# Patient Record
Sex: Male | Born: 1950
Health system: Southern US, Community
[De-identification: ages and names within clinical notes are randomized; demographics above are authoritative.]

## PROBLEM LIST (undated history)

## (undated) ENCOUNTER — Emergency Department (HOSPITAL_COMMUNITY): Discharge status: Against Medical Advice | Payer: Self-pay

## (undated) DIAGNOSIS — I639 Cerebral infarction, unspecified: Secondary | ICD-10-CM

## (undated) DIAGNOSIS — M1990 Unspecified osteoarthritis, unspecified site: Secondary | ICD-10-CM

## (undated) DIAGNOSIS — K6389 Other specified diseases of intestine: Secondary | ICD-10-CM

## (undated) DIAGNOSIS — Z87442 Personal history of urinary calculi: Secondary | ICD-10-CM

## (undated) DIAGNOSIS — I7121 Aneurysm of the ascending aorta, without rupture: Secondary | ICD-10-CM

## (undated) DIAGNOSIS — E039 Hypothyroidism, unspecified: Secondary | ICD-10-CM

## (undated) DIAGNOSIS — I10 Essential (primary) hypertension: Secondary | ICD-10-CM

## (undated) DIAGNOSIS — I8290 Acute embolism and thrombosis of unspecified vein: Secondary | ICD-10-CM

## (undated) DIAGNOSIS — I712 Thoracic aortic aneurysm, without rupture: Secondary | ICD-10-CM

## (undated) DIAGNOSIS — C189 Malignant neoplasm of colon, unspecified: Secondary | ICD-10-CM

## (undated) DIAGNOSIS — K635 Polyp of colon: Secondary | ICD-10-CM

## (undated) DIAGNOSIS — Z1379 Encounter for other screening for genetic and chromosomal anomalies: Secondary | ICD-10-CM

## (undated) DIAGNOSIS — Z972 Presence of dental prosthetic device (complete) (partial): Secondary | ICD-10-CM

## (undated) DIAGNOSIS — Z973 Presence of spectacles and contact lenses: Secondary | ICD-10-CM

## (undated) HISTORY — DX: Polyp of colon: K63.5

## (undated) HISTORY — DX: Malignant neoplasm of colon, unspecified: C18.9

## (undated) HISTORY — PX: MULTIPLE TOOTH EXTRACTIONS: SHX2053

## (undated) HISTORY — DX: Cerebral infarction, unspecified: I63.9

## (undated) HISTORY — DX: Encounter for other screening for genetic and chromosomal anomalies: Z13.79

---

## 1998-06-23 ENCOUNTER — Other Ambulatory Visit: Admission: RE | Admit: 1998-06-23 | Discharge: 1998-06-23 | Payer: Self-pay | Admitting: Internal Medicine

## 1998-07-22 ENCOUNTER — Other Ambulatory Visit: Admission: RE | Admit: 1998-07-22 | Discharge: 1998-07-22 | Payer: Self-pay | Admitting: Internal Medicine

## 2015-12-16 ENCOUNTER — Other Ambulatory Visit (HOSPITAL_COMMUNITY): Payer: Self-pay | Admitting: Orthopaedic Surgery

## 2016-01-05 NOTE — Pre-Procedure Instructions (Signed)
    Christian Weeks  01/05/2016      [facility] - [place], [place] - [address] [place] [address] Phone: [phone] Fax: [phone]    Your procedure is scheduled on 01/15/16.  Report to [facility] Admitting at 530 A.M.  Call this number if you have problems the morning of surgery:  [phone]   Remember:  Do not eat food or drink liquids after midnight.  Take these medicines the morning of surgery with A SIP OF WATER --synthroid   Do not wear jewelry, make-up or nail polish.  Do not wear lotions, powders, or perfumes.  You may wear deodorant.  Do not shave 48 hours prior to surgery.  Men may shave face and neck.  Do not bring valuables to the hospital.  [facility] is not responsible for any belongings or valuables.  Contacts, dentures or bridgework may not be worn into surgery.  Leave your suitcase in the car.  After surgery it may be brought to your room.  For patients admitted to the hospital, discharge time will be determined by your treatment team.  Patients discharged the day of surgery will not be allowed to drive home.   ase read over the following fact sheets that you were given. Pain Booklet, Coughing and Deep Breathing, Blood Transfusion Information, MRSA Information and Surgical Site Infection Prevention

## 2016-01-06 ENCOUNTER — Encounter (HOSPITAL_COMMUNITY)
Admission: RE | Admit: 2016-01-06 | Discharge: 2016-01-06 | Disposition: A | Discharge status: Home / Self Care | Payer: PPO | Source: Ambulatory Visit | Attending: Orthopaedic Surgery | Admitting: Orthopaedic Surgery

## 2016-01-06 ENCOUNTER — Encounter (HOSPITAL_COMMUNITY): Payer: Self-pay

## 2016-01-06 DIAGNOSIS — I517 Cardiomegaly: Secondary | ICD-10-CM | POA: Insufficient documentation

## 2016-01-06 DIAGNOSIS — M1712 Unilateral primary osteoarthritis, left knee: Secondary | ICD-10-CM | POA: Diagnosis not present

## 2016-01-06 DIAGNOSIS — Z01818 Encounter for other preprocedural examination: Secondary | ICD-10-CM | POA: Insufficient documentation

## 2016-01-06 DIAGNOSIS — Z01812 Encounter for preprocedural laboratory examination: Secondary | ICD-10-CM | POA: Diagnosis not present

## 2016-01-06 HISTORY — DX: Essential (primary) hypertension: I10

## 2016-01-06 HISTORY — DX: Hypothyroidism, unspecified: E03.9

## 2016-01-06 HISTORY — DX: Unspecified osteoarthritis, unspecified site: M19.90

## 2016-01-06 LAB — URINALYSIS, ROUTINE W REFLEX MICROSCOPIC
BILIRUBIN URINE: NEGATIVE
GLUCOSE, UA: NEGATIVE mg/dL
Hgb urine dipstick: NEGATIVE
KETONES UR: NEGATIVE mg/dL
Leukocytes, UA: NEGATIVE
NITRITE: NEGATIVE
PH: 5 (ref 5.0–8.0)
Protein, ur: NEGATIVE mg/dL
SPECIFIC GRAVITY, URINE: 1.021 (ref 1.005–1.030)

## 2016-01-06 LAB — CBC
HEMATOCRIT: 48.9 % (ref 39.0–52.0)
HEMOGLOBIN: 16.8 g/dL (ref 13.0–17.0)
MCH: 30.7 pg (ref 26.0–34.0)
MCHC: 34.4 g/dL (ref 30.0–36.0)
MCV: 89.4 fL (ref 78.0–100.0)
Platelets: 210 10*3/uL (ref 150–400)
RBC: 5.47 MIL/uL (ref 4.22–5.81)
RDW: 13.1 % (ref 11.5–15.5)
WBC: 8.7 10*3/uL (ref 4.0–10.5)

## 2016-01-06 LAB — COMPREHENSIVE METABOLIC PANEL
ALK PHOS: 80 U/L (ref 38–126)
ALT: 23 U/L (ref 17–63)
ANION GAP: 7 (ref 5–15)
AST: 24 U/L (ref 15–41)
Albumin: 4 g/dL (ref 3.5–5.0)
BILIRUBIN TOTAL: 1 mg/dL (ref 0.3–1.2)
BUN: 9 mg/dL (ref 6–20)
CALCIUM: 9.2 mg/dL (ref 8.9–10.3)
CO2: 22 mmol/L (ref 22–32)
CREATININE: 1.08 mg/dL (ref 0.61–1.24)
Chloride: 109 mmol/L (ref 101–111)
GFR calc non Af Amer: >60 mL/min (ref >60)
GLUCOSE: 107 mg/dL — AB (ref 65–99)
Potassium: 4.5 mmol/L (ref 3.5–5.1)
Sodium: 138 mmol/L (ref 135–145)
TOTAL PROTEIN: 7.6 g/dL (ref 6.5–8.1)

## 2016-01-06 LAB — PROTIME-INR
INR: 1.14 (ref 0.00–1.49)
PROTHROMBIN TIME: 14.8 s (ref 11.6–15.2)

## 2016-01-06 LAB — SURGICAL PCR SCREEN
MRSA, PCR: NEGATIVE
Staphylococcus aureus: NEGATIVE

## 2016-01-11 NOTE — H&P (Signed)
TOTAL KNEE ADMISSION H&P  Patient is being admitted for left total knee arthroplasty.  Subjective:  Chief Complaint:left knee pain.  HPI: Christian Weeks, 65 y.o. male, has a history of pain and functional disability in the left knee due to arthritis and has failed non-surgical conservative treatments for greater than 12 weeks to includeNSAID's and/or analgesics, corticosteriod injections, supervised PT with diminished ADL's post treatment, use of assistive devices, weight reduction as appropriate and activity modification.  Onset of symptoms was gradual, starting >10 years ago with gradually worsening course since that time..  Patient currently rates pain in the left knee(s) at 10 out of 10 with activity. Patient has night pain, worsening of pain with activity and weight bearing, pain that interferes with activities of daily living, pain with passive range of motion, crepitus and joint swelling.  Patient has evidence of subchondral cysts, subchondral sclerosis, periarticular osteophytes and joint space narrowing by imaging studies. . There is no active infection.  There are no active problems to display for this patient.  Past Medical History  Diagnosis Date  . Hypertension   . Hypothyroidism   . Arthritis     No past surgical history on file.  No prescriptions prior to admission   Not on File  Social History  Substance Use Topics  . Smoking status: Never Smoker   . Smokeless tobacco: Never Used  . Alcohol Use: No    No family history on file.   Review of Systems  Constitutional: Negative.   HENT: Negative.   Eyes: Negative.   Respiratory: Negative.   Cardiovascular: Negative.   Gastrointestinal: Negative.   Musculoskeletal: Positive for joint pain.  Skin: Negative.   Neurological: Negative.   Psychiatric/Behavioral: Negative.     Objective:  Physical Exam  Constitutional: He is oriented to person, place, and time. No distress.  HENT:  Head: Atraumatic.  Eyes: EOM are  normal.  Neck: Normal range of motion.  Cardiovascular: Normal rate.   Respiratory: No respiratory distress.  GI: He exhibits no distension.  Musculoskeletal: He exhibits tenderness.  Neurological: He is alert and oriented to person, place, and time.  Skin: Skin is warm and dry.  Psychiatric: He has a normal mood and affect.    Vital signs in last 24 hours:    Labs:   There is no height or weight on file to calculate BMI.   Imaging Review Plain radiographs demonstrate moderate degenerative joint disease of the left knee(s). The overall alignment ismild varus. The bone quality appears to be good for age and reported activity level.  Assessment/Plan:  End stage arthritis, left knee   The patient history, physical examination, clinical judgment of the provider and imaging studies are consistent with end stage degenerative joint disease of the left knee(s) and total knee arthroplasty is deemed medically necessary. The treatment options including medical management, injection therapy arthroscopy and arthroplasty were discussed at length. The risks and benefits of total knee arthroplasty were presented and reviewed. The risks due to aseptic loosening, infection, stiffness, patella tracking problems, thromboembolic complications and other imponderables were discussed. The patient acknowledged the explanation, agreed to proceed with the plan and consent was signed. Patient is being admitted for inpatient treatment for surgery, pain control, PT, OT, prophylactic antibiotics, VTE prophylaxis, progressive ambulation and ADL's and discharge planning. The patient is planning to be discharged home with home health services

## 2016-01-14 MED ORDER — CEFAZOLIN SODIUM-DEXTROSE 2-3 GM-% IV SOLR
2.0000 g | INTRAVENOUS | Status: AC
  Administered 2016-01-15: 2 g via INTRAVENOUS
  Filled 2016-01-14: qty 50

## 2016-01-14 MED ORDER — CHLORHEXIDINE GLUCONATE 4 % EX LIQD: 60.0000 mL | Freq: Once | CUTANEOUS | Status: DC

## 2016-01-15 ENCOUNTER — Encounter (HOSPITAL_COMMUNITY)
Admission: RE | Disposition: A | Discharge status: Home Health | Payer: Self-pay | Source: Ambulatory Visit | Attending: Orthopaedic Surgery

## 2016-01-15 ENCOUNTER — Encounter (HOSPITAL_COMMUNITY): Payer: Self-pay | Admitting: Anesthesiology

## 2016-01-15 ENCOUNTER — Inpatient Hospital Stay (HOSPITAL_COMMUNITY)
Admission: RE | Admit: 2016-01-15 | Discharge: 2016-01-17 | DRG: 470 | Disposition: A | Discharge status: Home Health | Payer: PPO | Source: Ambulatory Visit | Attending: Orthopaedic Surgery | Admitting: Orthopaedic Surgery

## 2016-01-15 ENCOUNTER — Inpatient Hospital Stay (HOSPITAL_COMMUNITY): Payer: PPO | Admitting: Vascular Surgery

## 2016-01-15 ENCOUNTER — Inpatient Hospital Stay (HOSPITAL_COMMUNITY): Payer: PPO | Admitting: Anesthesiology

## 2016-01-15 DIAGNOSIS — E039 Hypothyroidism, unspecified: Secondary | ICD-10-CM | POA: Diagnosis not present

## 2016-01-15 DIAGNOSIS — M94262 Chondromalacia, left knee: Secondary | ICD-10-CM | POA: Diagnosis not present

## 2016-01-15 DIAGNOSIS — I10 Essential (primary) hypertension: Secondary | ICD-10-CM | POA: Diagnosis present

## 2016-01-15 DIAGNOSIS — Z96651 Presence of right artificial knee joint: Secondary | ICD-10-CM

## 2016-01-15 DIAGNOSIS — M25562 Pain in left knee: Secondary | ICD-10-CM | POA: Diagnosis present

## 2016-01-15 DIAGNOSIS — Z96652 Presence of left artificial knee joint: Secondary | ICD-10-CM

## 2016-01-15 DIAGNOSIS — M1712 Unilateral primary osteoarthritis, left knee: Principal | ICD-10-CM | POA: Diagnosis present

## 2016-01-15 HISTORY — PX: KNEE ARTHROPLASTY: SHX992

## 2016-01-15 SURGERY — ARTHROPLASTY, KNEE, TOTAL, USING IMAGELESS COMPUTER-ASSISTED NAVIGATION
Anesthesia: General | Laterality: Left

## 2016-01-15 MED ORDER — DEXTROSE 5 % IV SOLN
500.0000 mg | Freq: Four times a day (QID) | INTRAVENOUS | Status: DC | PRN
  Filled 2016-01-15: qty 5

## 2016-01-15 MED ORDER — LACTATED RINGERS IV SOLN
INTRAVENOUS | Status: DC | PRN
  Administered 2016-01-15 (×2): via INTRAVENOUS

## 2016-01-15 MED ORDER — ONDANSETRON HCL 4 MG/2ML IJ SOLN
INTRAMUSCULAR | Status: DC | PRN
  Administered 2016-01-15: 4 mg via INTRAVENOUS

## 2016-01-15 MED ORDER — GLYCOPYRROLATE 0.2 MG/ML IJ SOLN
INTRAMUSCULAR | Status: AC
  Filled 2016-01-15: qty 1

## 2016-01-15 MED ORDER — GLYCOPYRROLATE 0.2 MG/ML IJ SOLN
INTRAMUSCULAR | Status: DC | PRN
  Administered 2016-01-15: 0.2 mg via INTRAVENOUS

## 2016-01-15 MED ORDER — EPHEDRINE SULFATE 50 MG/ML IJ SOLN
INTRAMUSCULAR | Status: AC
  Filled 2016-01-15: qty 1

## 2016-01-15 MED ORDER — METOCLOPRAMIDE HCL 5 MG PO TABS: 5.0000 mg | ORAL_TABLET | Freq: Three times a day (TID) | ORAL | Status: DC | PRN

## 2016-01-15 MED ORDER — SODIUM CHLORIDE 0.9 % IJ SOLN
INTRAMUSCULAR | Status: AC
  Filled 2016-01-15: qty 10

## 2016-01-15 MED ORDER — ONDANSETRON HCL 4 MG/2ML IJ SOLN: 4.0000 mg | Freq: Once | INTRAMUSCULAR | Status: DC | PRN

## 2016-01-15 MED ORDER — PROPOFOL 10 MG/ML IV BOLUS
INTRAVENOUS | Status: AC
  Filled 2016-01-15: qty 20

## 2016-01-15 MED ORDER — MENTHOL 3 MG MT LOZG: 1.0000 | LOZENGE | OROMUCOSAL | Status: DC | PRN

## 2016-01-15 MED ORDER — ARTIFICIAL TEARS OP OINT
TOPICAL_OINTMENT | OPHTHALMIC | Status: AC
  Filled 2016-01-15: qty 3.5

## 2016-01-15 MED ORDER — POLYETHYLENE GLYCOL 3350 17 G PO PACK: 17.0000 g | PACK | Freq: Every day | ORAL | Status: DC | PRN

## 2016-01-15 MED ORDER — PHENYLEPHRINE HCL 10 MG/ML IJ SOLN
INTRAMUSCULAR | Status: DC | PRN
  Administered 2016-01-15: 40 ug via INTRAVENOUS

## 2016-01-15 MED ORDER — EPHEDRINE SULFATE 50 MG/ML IJ SOLN
INTRAMUSCULAR | Status: DC | PRN
  Administered 2016-01-15 (×2): 5 mg via INTRAVENOUS
  Administered 2016-01-15: 10 mg via INTRAVENOUS
  Administered 2016-01-15: 5 mg via INTRAVENOUS

## 2016-01-15 MED ORDER — SODIUM CHLORIDE 0.9 % IR SOLN
Status: DC | PRN
  Administered 2016-01-15: 1000 mL
  Administered 2016-01-15: 3000 mL

## 2016-01-15 MED ORDER — LIDOCAINE HCL (CARDIAC) 20 MG/ML IV SOLN
INTRAVENOUS | Status: DC | PRN
  Administered 2016-01-15 (×2): 50 mg via INTRAVENOUS

## 2016-01-15 MED ORDER — HYDROMORPHONE HCL 1 MG/ML IJ SOLN
INTRAMUSCULAR | Status: AC
  Administered 2016-01-15: 0.5 mg via INTRAVENOUS
  Filled 2016-01-15: qty 1

## 2016-01-15 MED ORDER — FENTANYL CITRATE (PF) 100 MCG/2ML IJ SOLN
INTRAMUSCULAR | Status: DC | PRN
  Administered 2016-01-15 (×5): 50 ug via INTRAVENOUS

## 2016-01-15 MED ORDER — ASPIRIN EC 325 MG PO TBEC
325.0000 mg | DELAYED_RELEASE_TABLET | Freq: Every day | ORAL | Status: DC
  Administered 2016-01-16 – 2016-01-17 (×2): 325 mg via ORAL
  Filled 2016-01-15 (×3): qty 1

## 2016-01-15 MED ORDER — METHOCARBAMOL 500 MG PO TABS
500.0000 mg | ORAL_TABLET | Freq: Four times a day (QID) | ORAL | Status: DC | PRN
  Administered 2016-01-16: 500 mg via ORAL
  Filled 2016-01-15 (×2): qty 1

## 2016-01-15 MED ORDER — LIDOCAINE HCL (CARDIAC) 20 MG/ML IV SOLN
INTRAVENOUS | Status: AC
  Filled 2016-01-15: qty 10

## 2016-01-15 MED ORDER — METOCLOPRAMIDE HCL 5 MG/ML IJ SOLN: 5.0000 mg | Freq: Three times a day (TID) | INTRAMUSCULAR | Status: DC | PRN

## 2016-01-15 MED ORDER — PROPOFOL 500 MG/50ML IV EMUL
INTRAVENOUS | Status: DC | PRN
  Administered 2016-01-15: 100 ug/kg/min via INTRAVENOUS

## 2016-01-15 MED ORDER — LIDOCAINE HCL (CARDIAC) 20 MG/ML IV SOLN
INTRAVENOUS | Status: AC
  Filled 2016-01-15: qty 5

## 2016-01-15 MED ORDER — POTASSIUM CHLORIDE IN NACL 20-0.45 MEQ/L-% IV SOLN
INTRAVENOUS | Status: DC
  Administered 2016-01-15: 17:00:00 via INTRAVENOUS
  Filled 2016-01-15 (×5): qty 1000

## 2016-01-15 MED ORDER — LOSARTAN POTASSIUM 50 MG PO TABS
50.0000 mg | ORAL_TABLET | Freq: Every day | ORAL | Status: DC
  Administered 2016-01-16 – 2016-01-17 (×2): 50 mg via ORAL
  Filled 2016-01-15 (×3): qty 1

## 2016-01-15 MED ORDER — CEFAZOLIN SODIUM 1-5 GM-% IV SOLN
1.0000 g | Freq: Three times a day (TID) | INTRAVENOUS | Status: AC
  Administered 2016-01-15 (×2): 1 g via INTRAVENOUS
  Filled 2016-01-15 (×2): qty 50

## 2016-01-15 MED ORDER — HYDROMORPHONE HCL 1 MG/ML IJ SOLN: 1.0000 mg | INTRAMUSCULAR | Status: DC | PRN

## 2016-01-15 MED ORDER — DOCUSATE SODIUM 100 MG PO CAPS
100.0000 mg | ORAL_CAPSULE | Freq: Two times a day (BID) | ORAL | Status: DC
  Administered 2016-01-15 – 2016-01-17 (×4): 100 mg via ORAL
  Filled 2016-01-15 (×5): qty 1

## 2016-01-15 MED ORDER — ONDANSETRON HCL 4 MG/2ML IJ SOLN: 4.0000 mg | Freq: Four times a day (QID) | INTRAMUSCULAR | Status: DC | PRN

## 2016-01-15 MED ORDER — LEVOTHYROXINE SODIUM 25 MCG PO TABS
137.0000 ug | ORAL_TABLET | Freq: Every day | ORAL | Status: DC
  Administered 2016-01-16 – 2016-01-17 (×2): 137 ug via ORAL
  Filled 2016-01-15 (×2): qty 1

## 2016-01-15 MED ORDER — PHENOL 1.4 % MT LIQD
1.0000 | OROMUCOSAL | Status: DC | PRN
Start: 2016-01-15 — End: 2016-01-17

## 2016-01-15 MED ORDER — HYDROMORPHONE HCL 1 MG/ML IJ SOLN
0.5000 mg | INTRAMUSCULAR | Status: DC | PRN
  Administered 2016-01-15 (×3): 0.5 mg via INTRAVENOUS

## 2016-01-15 MED ORDER — OXYCODONE HCL 5 MG PO TABS
ORAL_TABLET | ORAL | Status: AC
  Filled 2016-01-15: qty 2

## 2016-01-15 MED ORDER — PHENYLEPHRINE 40 MCG/ML (10ML) SYRINGE FOR IV PUSH (FOR BLOOD PRESSURE SUPPORT)
PREFILLED_SYRINGE | INTRAVENOUS | Status: AC
  Filled 2016-01-15: qty 10

## 2016-01-15 MED ORDER — ACETAMINOPHEN 650 MG RE SUPP: 650.0000 mg | Freq: Four times a day (QID) | RECTAL | Status: DC | PRN

## 2016-01-15 MED ORDER — ACETAMINOPHEN 325 MG PO TABS: 650.0000 mg | ORAL_TABLET | Freq: Four times a day (QID) | ORAL | Status: DC | PRN

## 2016-01-15 MED ORDER — OXYCODONE HCL 5 MG PO TABS
5.0000 mg | ORAL_TABLET | ORAL | Status: DC | PRN
  Administered 2016-01-15 – 2016-01-16 (×4): 10 mg via ORAL
  Filled 2016-01-15 (×3): qty 2

## 2016-01-15 MED ORDER — ROCURONIUM BROMIDE 50 MG/5ML IV SOLN
INTRAVENOUS | Status: AC
  Filled 2016-01-15: qty 1

## 2016-01-15 MED ORDER — FENTANYL CITRATE (PF) 250 MCG/5ML IJ SOLN
INTRAMUSCULAR | Status: AC
  Filled 2016-01-15: qty 10

## 2016-01-15 MED ORDER — ONDANSETRON HCL 4 MG PO TABS: 4.0000 mg | ORAL_TABLET | Freq: Four times a day (QID) | ORAL | Status: DC | PRN

## 2016-01-15 SURGICAL SUPPLY — 74 items
APL SKNCLS STERI-STRIP NONHPOA (GAUZE/BANDAGES/DRESSINGS) ×1
BANDAGE ELASTIC 4 VELCRO ST LF (GAUZE/BANDAGES/DRESSINGS) ×2 IMPLANT
BANDAGE ELASTIC 6 VELCRO ST LF (GAUZE/BANDAGES/DRESSINGS) ×2 IMPLANT
BANDAGE ESMARK 6X9 LF (GAUZE/BANDAGES/DRESSINGS) ×1 IMPLANT
BENZOIN TINCTURE PRP APPL 2/3 (GAUZE/BANDAGES/DRESSINGS) ×2 IMPLANT
BLADE SAGITTAL 25.0X1.19X90 (BLADE) ×2 IMPLANT
BLADE SAW SGTL 13X75X1.27 (BLADE) ×2 IMPLANT
BLADE SURG 10 STRL SS (BLADE) ×4 IMPLANT
BNDG CMPR MED 10X6 ELC LF (GAUZE/BANDAGES/DRESSINGS) ×1
BNDG ELASTIC 6X10 VLCR STRL LF (GAUZE/BANDAGES/DRESSINGS) ×2 IMPLANT
BNDG ESMARK 6X9 LF (GAUZE/BANDAGES/DRESSINGS) ×2
BOWL SMART MIX CTS (DISPOSABLE) ×2 IMPLANT
CAP KNEE TOTAL 3 SIGMA ×2 IMPLANT
CEMENT HV SMART SET (Cement) ×4 IMPLANT
CLSR STERI-STRIP ANTIMIC 1/2X4 (GAUZE/BANDAGES/DRESSINGS) ×2 IMPLANT
COVER SURGICAL LIGHT HANDLE (MISCELLANEOUS) ×2 IMPLANT
CUFF TOURNIQUET SINGLE 34IN LL (TOURNIQUET CUFF) ×2 IMPLANT
DRAPE INCISE IOBAN 66X45 STRL (DRAPES) ×2 IMPLANT
DRAPE ORTHO SPLIT 77X108 STRL (DRAPES) ×4
DRAPE SURG ORHT 6 SPLT 77X108 (DRAPES) ×2 IMPLANT
DRAPE U-SHAPE 47X51 STRL (DRAPES) ×2 IMPLANT
DRSG PAD ABDOMINAL 8X10 ST (GAUZE/BANDAGES/DRESSINGS) ×4 IMPLANT
DURAPREP 26ML APPLICATOR (WOUND CARE) ×2 IMPLANT
ELECT REM PT RETURN 9FT ADLT (ELECTROSURGICAL) ×2
ELECTRODE REM PT RTRN 9FT ADLT (ELECTROSURGICAL) ×1 IMPLANT
FACESHIELD WRAPAROUND (MASK) ×4 IMPLANT
GAUZE SPONGE 4X4 12PLY STRL (GAUZE/BANDAGES/DRESSINGS) ×4 IMPLANT
GAUZE XEROFORM 5X9 LF (GAUZE/BANDAGES/DRESSINGS) ×2 IMPLANT
GLOVE BIOGEL PI IND STRL 6 (GLOVE) ×1 IMPLANT
GLOVE BIOGEL PI IND STRL 7.0 (GLOVE) ×1 IMPLANT
GLOVE BIOGEL PI IND STRL 8 (GLOVE) ×2 IMPLANT
GLOVE BIOGEL PI INDICATOR 6 (GLOVE) ×1
GLOVE BIOGEL PI INDICATOR 7.0 (GLOVE) ×1
GLOVE BIOGEL PI INDICATOR 8 (GLOVE) ×2
GLOVE ORTHO TXT STRL SZ7.5 (GLOVE) ×6 IMPLANT
GLOVE SURG SS PI 6.0 STRL IVOR (GLOVE) ×4 IMPLANT
GLOVE SURG SS PI 6.5 STRL IVOR (GLOVE) ×6 IMPLANT
GOWN STRL REUS W/ TWL LRG LVL3 (GOWN DISPOSABLE) ×1 IMPLANT
GOWN STRL REUS W/ TWL XL LVL3 (GOWN DISPOSABLE) ×1 IMPLANT
GOWN STRL REUS W/TWL 2XL LVL3 (GOWN DISPOSABLE) ×2 IMPLANT
GOWN STRL REUS W/TWL LRG LVL3 (GOWN DISPOSABLE) ×1
GOWN STRL REUS W/TWL XL LVL3 (GOWN DISPOSABLE) ×2
HANDPIECE INTERPULSE COAX TIP (DISPOSABLE) ×2
IMMOBILIZER KNEE 22 UNIV (SOFTGOODS) ×2 IMPLANT
KIT BASIN OR (CUSTOM PROCEDURE TRAY) ×2 IMPLANT
KIT ROOM TURNOVER OR (KITS) ×2 IMPLANT
MANIFOLD NEPTUNE II (INSTRUMENTS) ×2 IMPLANT
MARKER SPHERE PSV REFLC THRD 5 (MARKER) ×8 IMPLANT
NEEDLE HYPO 25GX1X1/2 BEV (NEEDLE) ×2 IMPLANT
NS IRRIG 1000ML POUR BTL (IV SOLUTION) ×2 IMPLANT
PACK TOTAL JOINT (CUSTOM PROCEDURE TRAY) ×2 IMPLANT
PACK UNIVERSAL I (CUSTOM PROCEDURE TRAY) ×2 IMPLANT
PAD ARMBOARD 7.5X6 YLW CONV (MISCELLANEOUS) ×4 IMPLANT
PAD CAST 4YDX4 CTTN HI CHSV (CAST SUPPLIES) ×1 IMPLANT
PADDING CAST ABS 6INX4YD NS (CAST SUPPLIES) ×1
PADDING CAST ABS COTTON 6X4 NS (CAST SUPPLIES) ×1 IMPLANT
PADDING CAST COTTON 4X4 STRL (CAST SUPPLIES) ×2
PADDING CAST COTTON 6X4 STRL (CAST SUPPLIES) ×4 IMPLANT
PIN SCHANZ 4MM 130MM (PIN) ×8 IMPLANT
SET HNDPC FAN SPRY TIP SCT (DISPOSABLE) ×1 IMPLANT
SPONGE GAUZE 4X4 12PLY STER LF (GAUZE/BANDAGES/DRESSINGS) ×2 IMPLANT
STAPLER VISISTAT 35W (STAPLE) IMPLANT
SUCTION FRAZIER TIP 10 FR DISP (SUCTIONS) ×2 IMPLANT
SUT ETHILON 3 0 PS 1 (SUTURE) ×2 IMPLANT
SUT VIC AB 1 CTX 36 (SUTURE) ×4
SUT VIC AB 1 CTX36XBRD ANBCTR (SUTURE) ×2 IMPLANT
SUT VIC AB 2-0 CT1 27 (SUTURE) ×8
SUT VIC AB 2-0 CT1 TAPERPNT 27 (SUTURE) ×4 IMPLANT
SUT VIC AB 3-0 X1 27 (SUTURE) ×4 IMPLANT
SYR CONTROL 10ML LL (SYRINGE) ×2 IMPLANT
TOWEL OR 17X24 6PK STRL BLUE (TOWEL DISPOSABLE) ×2 IMPLANT
TOWEL OR 17X26 10 PK STRL BLUE (TOWEL DISPOSABLE) ×2 IMPLANT
TRAY CATH 16FR W/PLASTIC CATH (SET/KITS/TRAYS/PACK) ×2 IMPLANT
WATER STERILE IRR 1000ML POUR (IV SOLUTION) ×2 IMPLANT

## 2016-01-15 NOTE — Progress Notes (Signed)
Utilization review completed.  

## 2016-01-15 NOTE — Op Note (Signed)
NAMEASHDYN, HALLUM NO.:  [id number]  MEDICAL RECORD NO.:  [id number]  LOCATION:  [facility]                         FACILITY:  [facility]  PHYSICIAN:  Mark C. Lorin Mercy, M.D.    DATE OF BIRTH:  1951-08-26  DATE OF PROCEDURE:  01/15/2016 DATE OF DISCHARGE:                              OPERATIVE REPORT   PREOPERATIVE DIAGNOSIS:  Left knee osteoarthritis.  POSTOPERATIVE DIAGNOSIS:  Left knee osteoarthritis.  PROCEDURE:  Left total knee arthroplasty, cemented.  Computer assist.  SURGEON:  Mark C. Lorin Mercy, M.D.  ASSISTANT:  Alyson Locket. Ricard Dillon, PA-C, medically necessary and present for the entire procedure.  ANESTHESIA:  Spinal plus IV sedation.  DESCRIPTION OF PROCEDURE:  After induction of spinal standard prepping and draping with the lateral post, heel bump, proximal thigh tourniquet, Ancef prophylaxis, DuraPrep, usual total knee, sheets, drapes, impervious stockinette, Coban were applied.  Sterile skin marker, Betadine, Steri-Drape x2, sealing the skin.  Computer pins were placed in midtibia with attachment.  Leg was wrapped with an Esmarch, tourniquet was inflated.  Midline incision was made.  Medial parapatellar incision was made.  Patella was reflected, cut, and sized for 41.  There were bone-on-bone changes, tricompartmental degenerative changes, marginal osteophytes, erosive grade 4 chondromalacia.  Pins were placed in the distal femur in the suprapatellar pouch.  Spurs were removed with a rongeur.  Meniscal remnant was left and the medial meniscus was resected.  There was less severe grade 3 and 4 changes in the lateral compartment and medial.  The patient had 20-degree flexion contracture and 7.5 degrees varus deformity from his erosive wear.  A 10- mm was taken off the distal femur, verified chamfer cuts, box cut.  On the tibia, 9 mm was taken off the high, 8 off the low.  Cuts were verified, sizing showed #5, which was appropriate on both sides.  Trials were  inserted.  After all pieces of bone had been removed, 3/4 curved osteotome was used to remove spurs off the posterior aspect of the femur and some loose pieces were removed, which were former loose bodies in the popliteal pouch.  Pulsatile lavage, waxing, mixing of the cement. Lug nuts were drilled and tibia was inserted first.  The femur was next cemented followed by patella after placement of the 10-mm poly, which gave full extension, good flexion, extension, balance.  Varus was corrected.  There was full extension and excellent flexion.  Once the cement was hard at 15 minutes, tourniquet was deflated.  Hemostasis obtained and then standard layered closure, #1 Vicryl in the deep layer, 2-0 in the subcutaneous tissue. Superficial retinaculum, subcuticular closure, tincture of benzoin, Steri-Strips, postop dressing.  Then, transferred to the recovery room. Instrument count and needle count were correct.     Mark C. Lorin Mercy, M.D.     MCY/MEDQ  D:  01/15/2016  T:  01/15/2016  Job:  [id number]

## 2016-01-15 NOTE — Interval H&P Note (Signed)
History and Physical Interval Note:  01/15/2016 7:19 AM  Christian Weeks  has presented today for surgery, with the diagnosis of End Stage Degenerative Joint Disease Left Knee  The various methods of treatment have been discussed with the patient and family. After consideration of risks, benefits and other options for treatment, the patient has consented to  Procedure(s): COMPUTER ASSISTED TOTAL KNEE ARTHROPLASTY (Left) as a surgical intervention .  The patient's history has been reviewed, patient examined, no change in status, stable for surgery.  I have reviewed the patient's chart and labs.  Questions were answered to the patient's satisfaction.     [name]

## 2016-01-15 NOTE — Progress Notes (Signed)
Report given to daviv rn as caregiver

## 2016-01-15 NOTE — Interval H&P Note (Signed)
History and Physical Interval Note:  01/15/2016 7:19 AM  Christian Weeks  has presented today for surgery, with the diagnosis of End Stage Degenerative Joint Disease Left Knee  The various methods of treatment have been discussed with the patient and family. After consideration of risks, benefits and other options for treatment, the patient has consented to  Procedure(s): COMPUTER ASSISTED TOTAL KNEE ARTHROPLASTY (Left) as a surgical intervention .  The patient's history has been reviewed, patient examined, no change in status, stable for surgery.  I have reviewed the patient's chart and labs.  Questions were answered to the patient's satisfaction.     [name] C

## 2016-01-15 NOTE — Anesthesia Postprocedure Evaluation (Signed)
Anesthesia Post Note  Patient: Christian Weeks  Procedure(s) Performed: Procedure(s) (LRB): COMPUTER ASSISTED TOTAL KNEE ARTHROPLASTY (Left)  Patient location during evaluation: PACU Anesthesia Type: Spinal Level of consciousness: awake, awake and alert, oriented and patient cooperative Pain management: pain level controlled Vital Signs Assessment: post-procedure vital signs reviewed and stable Respiratory status: spontaneous breathing Cardiovascular status: blood pressure returned to baseline and stable Anesthetic complications: no    Last Vitals:  Filed Vitals:   01/15/16 1045 01/15/16 1100  BP: 91/68 103/78  Pulse: 61 56  Temp:    Resp: 14 16    Last Pain:  Filed Vitals:   01/15/16 1102  PainSc: 4                  Jeremy Ditullio EDWARD

## 2016-01-15 NOTE — Anesthesia Preprocedure Evaluation (Signed)
Anesthesia Evaluation  Patient identified by MRN, date of birth, ID band Patient awake    Reviewed: Allergy & Precautions, NPO status , Patient's Chart, lab work & pertinent test results  Airway Mallampati: I  TM Distance: >3 FB     Dental   Pulmonary    Pulmonary exam normal        Cardiovascular hypertension, Normal cardiovascular exam     Neuro/Psych    GI/Hepatic   Endo/Other  Hypothyroidism   Renal/GU      Musculoskeletal  (+) Arthritis ,   Abdominal   Peds  Hematology   Anesthesia Other Findings   Reproductive/Obstetrics                             Anesthesia Physical Anesthesia Plan  ASA: II  Anesthesia Plan: Spinal   Post-op Pain Management:    Induction: Intravenous  Airway Management Planned: Mask  Additional Equipment:   Intra-op Plan:   Post-operative Plan:   Informed Consent: I have reviewed the patients History and Physical, chart, labs and discussed the procedure including the risks, benefits and alternatives for the proposed anesthesia with the patient or authorized representative who has indicated his/her understanding and acceptance.     Plan Discussed with: CRNA, Anesthesiologist and Surgeon  Anesthesia Plan Comments:         Anesthesia Quick Evaluation

## 2016-01-15 NOTE — Transfer of Care (Signed)
Immediate Anesthesia Transfer of Care Note  Patient: Christian Weeks  Procedure(s) Performed: Procedure(s): COMPUTER ASSISTED TOTAL KNEE ARTHROPLASTY (Left)  Patient Location: PACU  Anesthesia Type:Spinal and MAC combined with regional for post-op pain  Level of Consciousness: awake, oriented, sedated, patient cooperative and responds to stimulation  Airway & Oxygen Therapy: Patient Spontanous Breathing and Patient connected to nasal cannula oxygen  Post-op Assessment: Report given to RN, Post -op Vital signs reviewed and stable, Patient moving all extremities and Patient moving all extremities X 4  Post vital signs: Reviewed and stable  Last Vitals:  Filed Vitals:   01/15/16 0553  BP: 129/87  Pulse: 64  Temp: 36.4 C  Resp: 16    Complications: No apparent anesthesia complications

## 2016-01-15 NOTE — Anesthesia Procedure Notes (Addendum)
Spinal  Start time: 01/15/2016 7:40 AM End time: 01/15/2016 7:45 AM Staffing Anesthesiologist: Kate Sable Performed by: anesthesiologist  Preanesthetic Checklist Completed: patient identified, site marked, surgical consent, pre-op evaluation, timeout performed, IV checked, risks and benefits discussed and monitors and equipment checked Spinal Block Patient position: left lateral decubitus Prep: Betadine Patient monitoring: heart rate, cardiac monitor, continuous pulse ox and blood pressure Approach: midline Location: L3-4 Injection technique: single-shot Needle Needle type: Quincke  Needle gauge: 22 G Needle length: 9 cm Needle insertion depth: 6 cm Assessment Sensory level: T10 Additional Notes Pt accepts procedure w/ risks. 3 attempts. 8 mg ( 0.75% Marcaine w/ epi) w/o difficulty. CSF clear free flow w/o blood. Pt tolerated well. GES  Procedure Name: MAC Date/Time: 01/15/2016 7:43 AM Performed by: Jacquiline Doe A Pre-anesthesia Checklist: Patient identified, Emergency Drugs available, Suction available, Patient being monitored and Timeout performed Patient Re-evaluated:Patient Re-evaluated prior to inductionOxygen Delivery Method: Simple face mask Intubation Type: IV induction Placement Confirmation: positive ETCO2 Dental Injury: Teeth and Oropharynx as per pre-operative assessment

## 2016-01-15 NOTE — Progress Notes (Signed)
Orthopedic Tech Progress Note Patient Details:  Christian Weeks Dec 28, 1950 [id number]  CPM Left Knee CPM Left Knee: On Left Knee Flexion (Degrees): 60 Left Knee Extension (Degrees): 0 Additional Comments: Trapeze bar   [name] 01/15/2016, 11:48 AM

## 2016-01-15 NOTE — Evaluation (Signed)
Physical Therapy Evaluation Patient Details Name: Christian Weeks MRN: [id number] DOB: 11-16-1951 Today's Date: 01/15/2016   History of Present Illness  Patient is a 65 yo male admitted 01/15/16 now s/p Lt TKA.   PMH:  HTN, OA  Clinical Impression  Patient presents with problems listed below.  Will benefit from acute PT to maximize functional mobility prior to discharge home with family.  Recommend HHPT for continued therapy at d/c.    Follow Up Recommendations Home health PT;Supervision for mobility/OOB    Equipment Recommendations  Rolling walker with 5" wheels;3in1 (PT) (May need bariatric equipment)    Recommendations for Other Services       Precautions / Restrictions Precautions Precautions: Knee Precaution Booklet Issued: Yes (comment) Precaution Comments: Reviewed precautions with patient and wife Required Braces or Orthoses: Knee Immobilizer - Left Knee Immobilizer - Left: On except when in CPM Restrictions Weight Bearing Restrictions: Yes LLE Weight Bearing: Weight bearing as tolerated      Mobility  Bed Mobility Overal bed mobility: Needs Assistance Bed Mobility: Supine to Sit     Supine to sit: Min assist     General bed mobility comments: Instructed patient and wife on donning KI on LLE.  Verbal cues for bed mobility technique.  Min assist to bring LLE off of bed.  Patient using bed rail to assist in raising trunk to sitting position.  Once upright, patient with good sitting balance.  Transfers Overall transfer level: Needs assistance Equipment used: Rolling walker (2 wheeled) Transfers: Sit to/from [facility] Sit to Stand: Mod assist;From elevated surface Stand pivot transfers: Min assist       General transfer comment: Verbal cues for hand placement and technique.  Assist to power up to standing from elevated bed.  Patient able to take several shuffle steps to pivot to chair.  Assist to control descent to sitting.   Lt knee instability  supported by KI.  Ambulation/Gait                Stairs            Wheelchair Mobility    Modified Rankin (Stroke Patients Only)       Balance                                             Pertinent Vitals/Pain Pain Assessment: 0-10 Pain Score: 2  Pain Location: Lt knee Pain Descriptors / Indicators: Sore Pain Intervention(s): Monitored during session;Repositioned    Home Living Family/patient expects to be discharged to:: Private residence Living Arrangements: Spouse/significant other Available Help at Discharge: Family;Available 24 hours/day (sons and son-in-law nearby to help prn) Type of Home: House Home Access: Stairs to enter Entrance Stairs-Rails: Psychiatric nurse of Steps: 2 Home Layout: Two level;Able to live on main level with bedroom/bathroom (one step down into den) Home Equipment: None      Prior Function Level of Independence: Independent               Hand Dominance        Extremity/Trunk Assessment   Upper Extremity Assessment: Overall WFL for tasks assessed           Lower Extremity Assessment: LLE deficits/detail   LLE Deficits / Details: Decreased strength and ROM due to surgery/pain  Cervical / Trunk Assessment: Normal  Communication   Communication: No difficulties  Cognition Arousal/Alertness:  Awake/alert Behavior During Therapy: WFL for tasks assessed/performed Overall Cognitive Status: Within Functional Limits for tasks assessed                      General Comments      Exercises Total Joint Exercises Ankle Circles/Pumps: AROM;Both;10 reps;Seated Quad Sets: AROM;Left;5 reps;Seated      Assessment/Plan    PT Assessment Patient needs continued PT services  PT Diagnosis Difficulty walking;Generalized weakness;Acute pain   PT Problem List Decreased strength;Decreased range of motion;Decreased activity tolerance;Decreased balance;Decreased mobility;Decreased  knowledge of use of DME;Decreased knowledge of precautions;Pain  PT Treatment Interventions DME instruction;Gait training;Stair training;Functional mobility training;Therapeutic activities;Therapeutic exercise;Patient/family education   PT Goals (Current goals can be found in the Care Plan section) Acute Rehab PT Goals Patient Stated Goal: To be able to return home PT Goal Formulation: With patient/family Time For Goal Achievement: 01/22/16 Potential to Achieve Goals: Good    Frequency 7X/week   Barriers to discharge        Co-evaluation               End of Session Equipment Utilized During Treatment: Gait belt;Left knee immobilizer Activity Tolerance: Patient tolerated treatment well Patient left: in chair;with call bell/phone within reach;with family/visitor present Nurse Communication: Mobility status (+2 assist to return to bed)         Time: [id number] PT Time Calculation (min) (ACUTE ONLY): 27 min   Charges:   PT Evaluation $PT Eval Moderate Complexity: 1 Procedure PT Treatments $Therapeutic Activity: 8-22 mins   PT G CodesDespina Pole 02-13-16, 8:10 PM Carita Pian. Sanjuana Kava, [place] Pager [phone]

## 2016-01-16 LAB — BASIC METABOLIC PANEL
ANION GAP: 9 (ref 5–15)
BUN: 12 mg/dL (ref 6–20)
CALCIUM: 8.3 mg/dL — AB (ref 8.9–10.3)
CO2: 24 mmol/L (ref 22–32)
Chloride: 101 mmol/L (ref 101–111)
Creatinine, Ser: 1.32 mg/dL — ABNORMAL HIGH (ref 0.61–1.24)
GFR, EST NON AFRICAN AMERICAN: 55 mL/min — AB (ref >60)
GLUCOSE: 118 mg/dL — AB (ref 65–99)
POTASSIUM: 4.4 mmol/L (ref 3.5–5.1)
SODIUM: 134 mmol/L — AB (ref 135–145)

## 2016-01-16 LAB — CBC
HCT: 38.7 % — ABNORMAL LOW (ref 39.0–52.0)
Hemoglobin: 13.4 g/dL (ref 13.0–17.0)
MCH: 30.8 pg (ref 26.0–34.0)
MCHC: 34.6 g/dL (ref 30.0–36.0)
MCV: 89 fL (ref 78.0–100.0)
PLATELETS: 173 10*3/uL (ref 150–400)
RBC: 4.35 MIL/uL (ref 4.22–5.81)
RDW: 13.1 % (ref 11.5–15.5)
WBC: 10 10*3/uL (ref 4.0–10.5)

## 2016-01-16 NOTE — Care Management Note (Signed)
Case Management Note  Patient Details  Name: Christian Weeks MRN: [id number] Date of Birth: Aug 01, 1951  Subjective/Objective:    65 yr old male s/p left total knee arthroplasty.                Action/Plan: Case manager spoke with patient and family at the bedside concerning home health and DME needs. Choice for home health agency was offered. Referral was called to [facility], [place] Transition Specialist. Patient will have wife and family support at discharge.  Discharge Date:   01/17/16        Expected Discharge Plan:  [place]  In-House Referral:  NA  Discharge planning Services  CM Consult  Post Acute Care Choice:  Durable Medical Equipment, Home Health Choice offered to:     DME Arranged:  3-N-1, Walker tall DME Agency:  [place]:  PT [facility] Agency:  [place]  Status of Service:  Completed, signed off  Medicare Important Message Given:    Date Medicare IM Given:    Medicare IM give by:    Date Additional Medicare IM Given:    Additional Medicare Important Message give by:     If discussed at [place] of Stay Meetings, dates discussed:    Additional Comments:  Ninfa Meeker, RN 01/16/2016, 12:24 PM

## 2016-01-16 NOTE — Progress Notes (Signed)
Patient ID: Christian Weeks, male   DOB: 1951/07/04, 65 y.o.   MRN: [id number] Postoperative day 1 left total knee arthroplasty. Patient states that he is doing well plan for physical therapy progressive ambulation possible discharge tomorrow.

## 2016-01-16 NOTE — Progress Notes (Signed)
Physical Therapy Treatment Patient Details Name: Christian Weeks MRN: [id number] DOB: 11-12-1951 Today's Date: 01/16/2016    History of Present Illness Patient is a 65 yo male admitted 01/15/16 now s/p Lt TKA.   PMH:  HTN, OA    PT Comments    Patient continues to make gains with mobility and gait.  Able to ambulate 52' with RW and min assist.  Will initiate stair training at next session.  Follow Up Recommendations  Home health PT;Supervision for mobility/OOB     Equipment Recommendations  Rolling walker with 5" wheels;3in1 (PT)    Recommendations for Other Services       Precautions / Restrictions Precautions Precautions: Knee Precaution Comments: Reviewed precautions Required Braces or Orthoses: Knee Immobilizer - Left Knee Immobilizer - Left: On except when in CPM Restrictions Weight Bearing Restrictions: Yes LLE Weight Bearing: Weight bearing as tolerated    Mobility  Bed Mobility Overal bed mobility: Needs Assistance Bed Mobility: Sit to Supine       Sit to supine: Min assist   General bed mobility comments: Assist to bring LLE onto bed to return to supine.  Transfers Overall transfer level: Needs assistance Equipment used: Rolling walker (2 wheeled) Transfers: Sit to/from Stand Sit to Stand: Min guard         General transfer comment: Assist for safety  Ambulation/Gait Ambulation/Gait assistance: Min assist Ambulation Distance (Feet): 80 Feet Assistive device: Rolling walker (2 wheeled) Gait Pattern/deviations: Step-to pattern;Decreased stance time - left;Decreased step length - right;Decreased stride length;Decreased weight shift to left;Antalgic Gait velocity: decreased Gait velocity interpretation: Below normal speed for age/gender General Gait Details: Assist for balance/safety.  Cues to look up during gait.   Stairs            Wheelchair Mobility    Modified Rankin (Stroke Patients Only)       Balance                                     Cognition Arousal/Alertness: Awake/alert Behavior During Therapy: WFL for tasks assessed/performed Overall Cognitive Status: Within Functional Limits for tasks assessed                      Exercises Total Joint Exercises Ankle Circles/Pumps: AROM;Both;10 reps;Seated Quad Sets: AROM;Left;10 reps;Supine Short Arc Quad: AROM;Left;10 reps;Supine Heel Slides: AROM;Left;10 reps;Supine Hip ABduction/ADduction: AROM;Left;10 reps;Supine Long Arc Quad: AROM;Left;5 reps;Seated Knee Flexion: AROM;Left;10 reps;Seated Goniometric ROM: -10 ext, 70 flexion    General Comments        Pertinent Vitals/Pain Pain Assessment: 0-10 Pain Score: 4  Pain Location: Lt knee Pain Descriptors / Indicators: Aching Pain Intervention(s): Limited activity within patient's tolerance;Monitored during session;Repositioned;Ice applied    Home Living                      Prior Function            PT Goals (current goals can now be found in the care plan section) Progress towards PT goals: Progressing toward goals    Frequency  7X/week    PT Plan Current plan remains appropriate    Co-evaluation             End of Session Equipment Utilized During Treatment: Gait belt;Left knee immobilizer Activity Tolerance: Patient tolerated treatment well Patient left: in bed;with call bell/phone within reach;with family/visitor present     Time: [id number]  PT Time Calculation (min) (ACUTE ONLY): 26 min  Charges:  $Gait Training: 8-22 mins $Therapeutic Exercise: 8-22 mins                    G Codes:      Despina Pole 2016/02/11, 4:54 PM Carita Pian. Sanjuana Kava, [place] Pager [phone]

## 2016-01-16 NOTE — Evaluation (Addendum)
Occupational Therapy Evaluation Patient Details Name: Christian Weeks MRN: [id number] DOB: Jul 08, 1951 Today's Date: 01/16/2016    History of Present Illness Patient is a 65 yo male admitted 01/15/16 now s/p Lt TKA.   PMH:  HTN, OA   Clinical Impression   Pt reports he was independent with ADLs and mobility PTA. Currently pt is overall min guard for safety with ADLs and functional mobility with the exception of min assist for LB ADLs. Began safety and ADL education with pt and family. Pt planning to d/c home with 24/7 supervision from family. Pt would benefit from continued skilled OT in order to maximize independence and safety with tub transfer using a 3 in 1.     Follow Up Recommendations  No OT follow up;Supervision - Intermittent    Equipment Recommendations  3 in 1 bedside comode    Recommendations for Other Services       Precautions / Restrictions Precautions Precautions: Knee Precaution Booklet Issued: Yes (comment) Precaution Comments: Reviewed use of KI Required Braces or Orthoses: Knee Immobilizer - Left Knee Immobilizer - Left: On except when in CPM Restrictions Weight Bearing Restrictions: Yes LLE Weight Bearing: Weight bearing as tolerated      Mobility Bed Mobility           General bed mobility comments: Pt OOB in chair  Transfers Overall transfer level: Needs assistance Equipment used: Rolling walker (2 wheeled) Transfers: Sit to/from Stand Sit to Stand: Min guard         General transfer comment: Min guard for safety. VCs for hand placement and technique. Sit to stand from chair x 1.    Balance Overall balance assessment: Needs assistance Sitting-balance support: Feet supported;No upper extremity supported Sitting balance-Leahy Scale: Good     Standing balance support: Bilateral upper extremity supported Standing balance-Leahy Scale: Poor Standing balance comment: RW for support                            ADL Overall ADL's  : Needs assistance/impaired Eating/Feeding: Set up;Sitting   Grooming: Min guard;Standing       Lower Body Bathing: Minimal assistance;Sit to/from stand       Lower Body Dressing: Minimal assistance;Sit to/from stand Lower Body Dressing Details (indicate cue type and reason): Pt able to don R sock, difficulty reaching L foot. Pt reports wife can assist as needed. Educated on compensatory strategies for LB ADLs. Toilet Transfer: Min guard;Ambulation;BSC;RW (BSC over toilet) Toilet Transfer Details (indicate cue type and reason): Educated on use of 3 in 1 over toilet Toileting- Water quality scientist and Hygiene: Min guard;Sit to/from Nurse, children's Details (indicate cue type and reason): Educated on use of 3 in 1 in tub; plan to practice tub transfer next session. Functional mobility during ADLs: Min guard;Rolling walker General ADL Comments: Pts wife and step son present for OT eval. Educated on home safety, safety with RW, use of KI, ice for edema and pain.     Vision     Perception     Praxis      Pertinent Vitals/Pain Pain Assessment: 0-10 Pain Score: 3  Pain Location: L knee Pain Descriptors / Indicators: Sore Pain Intervention(s): Limited activity within patient's tolerance;Monitored during session;Ice applied     Hand Dominance     Extremity/Trunk Assessment Upper Extremity Assessment Upper Extremity Assessment: Overall WFL for tasks assessed   Lower Extremity Assessment Lower Extremity Assessment: Defer to PT evaluation  Cervical / Trunk Assessment Cervical / Trunk Assessment: Normal   Communication Communication Communication: No difficulties   Cognition Arousal/Alertness: Awake/alert Behavior During Therapy: WFL for tasks assessed/performed Overall Cognitive Status: Within Functional Limits for tasks assessed                     General Comments       Exercises      Shoulder Instructions      Home Living Family/patient  expects to be discharged to:: Private residence Living Arrangements: Spouse/significant other Available Help at Discharge: Family;Available 24 hours/day Type of Home: House Home Access: Stairs to enter [employer] of Steps: 2 Entrance Stairs-Rails: Right;Left Home Layout: Two level;Able to live on main level with bedroom/bathroom Alternate Level Stairs-Number of Steps: 1 Alternate Level Stairs-Rails: None Bathroom Shower/Tub: Tub/shower unit;Curtain Shower/tub characteristics: Architectural technologist: Handicapped height Bathroom Accessibility: Yes How Accessible: Accessible via walker Home Equipment: Grab bars - toilet;Grab bars - tub/shower          Prior Functioning/Environment Level of Independence: Independent             OT Diagnosis: Acute pain   OT Problem List: Decreased activity tolerance;Impaired balance (sitting and/or standing);Decreased knowledge of use of DME or AE;Decreased knowledge of precautions;Pain;Obesity   OT Treatment/Interventions: Self-care/ADL training;Energy conservation;DME and/or AE instruction;Patient/family education    OT Goals(Current goals can be found in the care plan section) Acute Rehab OT Goals Patient Stated Goal: To go home OT Goal Formulation: With patient/family Time For Goal Achievement: 01/30/16 Potential to Achieve Goals: Good ADL Goals Pt Will Perform Tub/Shower Transfer: Tub transfer;with supervision;ambulating;3 in 1;rolling walker  OT Frequency: Min 2X/week   Barriers to D/C:            Co-evaluation              End of Session Equipment Utilized During Treatment: Gait belt;Rolling walker;Left knee immobilizer CPM Left Knee CPM Left Knee: Off   Activity Tolerance: Patient tolerated treatment well Patient left: in chair;with call bell/phone within reach;with nursing/sitter in room;with family/visitor present   Time: [id number] OT Time Calculation (min): 17 min Charges:  OT General Charges $OT  Visit: 1 Procedure OT Evaluation $OT Eval Moderate Complexity: 1 Procedure G-Codes:      Binnie Kand M.S., OTR/L Pager: [phone]  01/16/2016, 10:42 AM

## 2016-01-16 NOTE — Progress Notes (Signed)
Clinical Social Work  CSW received referral to assist with SNF placement. Per chart review, PT recommends HH. CSW is signing off but available if needed.  Gurnoor Sloop, LCSW (Weekend Coverage) 

## 2016-01-16 NOTE — Progress Notes (Signed)
Physical Therapy Treatment Patient Details Name: Christian Weeks MRN: [id number] DOB: December 28, 1950 Today's Date: 01/16/2016    History of Present Illness Patient is a 66 yo male admitted 01/15/16 now s/p Lt TKA.   PMH:  HTN, OA    PT Comments    Patient making improvements with mobility and gait.  Follow Up Recommendations  Home health PT;Supervision for mobility/OOB     Equipment Recommendations  Rolling walker with 5" wheels;3in1 (PT) (May need bariatric equipment)    Recommendations for Other Services       Precautions / Restrictions Precautions Precautions: Knee Precaution Booklet Issued: Yes (comment) Precaution Comments: Reviewed precautions with patient and wife Required Braces or Orthoses: Knee Immobilizer - Left Knee Immobilizer - Left: On except when in CPM Restrictions Weight Bearing Restrictions: Yes LLE Weight Bearing: Weight bearing as tolerated    Mobility  Bed Mobility Overal bed mobility: Needs Assistance Bed Mobility: Supine to Sit     Supine to sit: Min assist     General bed mobility comments: Instructed patient, wife, and son.  Assist to bring LLE off of bed and to raise trunk to sitting position.  Once upright, patient with good balance.  Transfers Overall transfer level: Needs assistance Equipment used: Rolling walker (2 wheeled) Transfers: Sit to/from Stand Sit to Stand: Min assist         General transfer comment: Verbal cues for hand placement.  Assist to power up to standing from bed.  Ambulation/Gait Ambulation/Gait assistance: Min assist Ambulation Distance (Feet): 30 Feet Assistive device: Rolling walker (2 wheeled) Gait Pattern/deviations: Step-to pattern;Decreased stance time - left;Decreased step length - right;Decreased stride length;Decreased weight shift to left;Antalgic Gait velocity: decreased Gait velocity interpretation: Below normal speed for age/gender General Gait Details: Verbal cues for safe use of RW and gait  sequence.  Min assist for balance/safety.  Patient with slow, steady gait.   Stairs            Wheelchair Mobility    Modified Rankin (Stroke Patients Only)       Balance                                    Cognition Arousal/Alertness: Awake/alert Behavior During Therapy: WFL for tasks assessed/performed Overall Cognitive Status: Within Functional Limits for tasks assessed                      Exercises Total Joint Exercises Ankle Circles/Pumps: AROM;Both;10 reps;Seated Quad Sets: AROM;Left;10 reps;Seated Towel Squeeze: AROM;Both;10 reps;Seated Short Arc Quad: AROM;Left;10 reps;Seated Heel Slides: AROM;Left;5 reps;Seated    General Comments        Pertinent Vitals/Pain Pain Assessment: 0-10 Pain Score: 4  Pain Location: Lt knee Pain Descriptors / Indicators: Aching Pain Intervention(s): Monitored during session;Repositioned;Ice applied    Home Living                      Prior Function            PT Goals (current goals can now be found in the care plan section) Acute Rehab PT Goals Patient Stated Goal: To go home Progress towards PT goals: Progressing toward goals    Frequency  7X/week    PT Plan Current plan remains appropriate    Co-evaluation             End of Session Equipment Utilized During Treatment: Gait belt;Left knee  immobilizer Activity Tolerance: Patient tolerated treatment well Patient left: in chair;with call bell/phone within reach;with family/visitor present     Time: [id number] PT Time Calculation (min) (ACUTE ONLY): 24 min  Charges:  $Gait Training: 8-22 mins $Therapeutic Exercise: 8-22 mins                    G Codes:      Despina Pole 02/02/2016, 10:30 AM Carita Pian. Sanjuana Kava, [place] Pager [phone]

## 2016-01-17 LAB — CBC
HCT: 35.6 % — ABNORMAL LOW (ref 39.0–52.0)
Hemoglobin: 12.3 g/dL — ABNORMAL LOW (ref 13.0–17.0)
MCH: 30.6 pg (ref 26.0–34.0)
MCHC: 34.6 g/dL (ref 30.0–36.0)
MCV: 88.6 fL (ref 78.0–100.0)
PLATELETS: 138 10*3/uL — AB (ref 150–400)
RBC: 4.02 MIL/uL — AB (ref 4.22–5.81)
RDW: 13.2 % (ref 11.5–15.5)
WBC: 12.8 10*3/uL — AB (ref 4.0–10.5)

## 2016-01-17 MED ORDER — OXYCODONE-ACETAMINOPHEN 5-325 MG PO TABS: 1.0000 | ORAL_TABLET | ORAL | Status: DC | PRN

## 2016-01-17 MED ORDER — ASPIRIN 325 MG PO TBEC: 325.0000 mg | DELAYED_RELEASE_TABLET | Freq: Every day | ORAL | Status: DC

## 2016-01-17 NOTE — Discharge Instructions (Signed)

## 2016-01-17 NOTE — Progress Notes (Signed)
Patient ID: Christian Weeks, male   DOB: Dec 14, 1951, 65 y.o.   MRN: [id number] Patient made good progress with therapy. Patient feels he is safe for discharge to home at this time plan for discharge to home follow-up with Dr. Lorin Mercy in 1 week.

## 2016-01-17 NOTE — Progress Notes (Signed)
Physical Therapy Treatment Patient Details Name: DELMAS KALKOWSKI MRN: [id number] DOB: 01/12/1951 Today's Date: 01/17/2016    History of Present Illness Patient is a 65 yo male admitted 01/15/16 now s/p Lt TKA.   PMH:  HTN, OA    PT Comments    Patient able to ambulate 80' with RW, and negotiate stairs with min assist.  Wife and son able to assist patient on stairs safely.  Patient ready for d/c from PT perspective.  RN notified.  Will have f/u HHPT.  Follow Up Recommendations  Home health PT;Supervision for mobility/OOB     Equipment Recommendations  Rolling walker with 5" wheels;3in1 (PT)    Recommendations for Other Services       Precautions / Restrictions Precautions Precautions: Knee Required Braces or Orthoses: Knee Immobilizer - Left Knee Immobilizer - Left: On except when in CPM Restrictions Weight Bearing Restrictions: Yes LLE Weight Bearing: Weight bearing as tolerated    Mobility  Bed Mobility               General bed mobility comments: In recliner  Transfers Overall transfer level: Needs assistance Equipment used: Rolling walker (2 wheeled) Transfers: Sit to/from Stand Sit to Stand: Supervision         General transfer comment: Assist for safety  Ambulation/Gait Ambulation/Gait assistance: Min guard Ambulation Distance (Feet): 160 Feet Assistive device: Rolling walker (2 wheeled) Gait Pattern/deviations: Step-to pattern;Decreased stance time - left;Decreased step length - right;Decreased stride length Gait velocity: decreased Gait velocity interpretation: Below normal speed for age/gender General Gait Details: Assist for balance/safety.  Cues to look up during gait.   Stairs Stairs: Yes Stairs assistance: Min assist Stair Management: One rail Left;Step to pattern;Forwards Number of Stairs: 6 (3 steps x2) General stair comments: Instructed patient, wife, and son on negotiating stairs using step-to technique and 1 rail.  Patient able to  perform with min assist.  Son and wife able to assist patient safely.  Wheelchair Mobility    Modified Rankin (Stroke Patients Only)       Balance                                    Cognition Arousal/Alertness: Awake/alert Behavior During Therapy: WFL for tasks assessed/performed Overall Cognitive Status: Within Functional Limits for tasks assessed                      Exercises Total Joint Exercises Ankle Circles/Pumps: AROM;Both;10 reps;Seated Quad Sets: AROM;Left;10 reps;Seated Towel Squeeze: AROM;Both;10 reps;Seated Short Arc Quad: AROM;Left;10 reps;Seated Heel Slides: AROM;Left;10 reps;Seated Hip ABduction/ADduction: AROM;Left;10 reps;Seated Long Arc Quad: AROM;Left;5 reps;Seated Knee Flexion: AROM;Left;10 reps;Seated Goniometric ROM: 10 to 75    General Comments        Pertinent Vitals/Pain Pain Assessment: 0-10 Pain Score: 2  Pain Location: Lt knee Pain Descriptors / Indicators: Sore Pain Intervention(s): Monitored during session;Repositioned    Home Living                      Prior Function            PT Goals (current goals can now be found in the care plan section) Progress towards PT goals: Progressing toward goals    Frequency  7X/week    PT Plan Current plan remains appropriate    Co-evaluation             End of Session Equipment  Utilized During Treatment: Gait belt;Left knee immobilizer Activity Tolerance: Patient tolerated treatment well Patient left: in chair;with call bell/phone within reach;with family/visitor present     Time: [id number] PT Time Calculation (min) (ACUTE ONLY): 30 min  Charges:  $Gait Training: 8-22 mins $Therapeutic Exercise: 8-22 mins                    G Codes:      Despina Pole 01-26-2016, 10:18 AM Carita Pian. Sanjuana Kava, [place] Pager [phone]

## 2016-01-17 NOTE — Progress Notes (Addendum)
Occupational Therapy Treatment Patient Details Name: MICHAELJOSEPH KALIL MRN: [id number] DOB: 11-Jul-1951 Today's Date: 01/17/2016    History of present illness Patient is a 65 y.o. male admitted 01/15/16 now s/p Lt TKA.   PMH:  HTN, OA   OT comments  Education provided in session with wife present as well and feel pt is safe to d/c home from OT standpoint.   Follow Up Recommendations  No OT follow up;Supervision - Intermittent    Equipment Recommendations  3 in 1 bedside comode    Recommendations for Other Services      Precautions / Restrictions Precautions Precautions: Knee Precaution Booklet Issued: No Precaution Comments: reviewed knee precautions Required Braces or Orthoses: Knee Immobilizer - Left Knee Immobilizer - Left: On except when in CPM (and with PT) Restrictions Weight Bearing Restrictions: Yes LLE Weight Bearing: Weight bearing as tolerated       Mobility Bed Mobility               General bed mobility comments: not assessed  Transfers Overall transfer level: Needs assistance   Transfers: Sit to/from Stand Sit to Stand: Supervision (set up for RW)         General transfer comment: RW in front of pt prior to stand    Balance        Used RW for ambulation and upon standing. No physical assist required for balance.                           ADL Overall ADL's : Needs assistance/impaired                     Lower Body Dressing: Moderate assistance;Sitting/lateral leans Lower Body Dressing Details (indicate cue type and reason): donned shoes-pt donned one and OT donned other; OT donned knee immobilizer Toilet Transfer: Supervision/safety;Set up;Ambulation;RW (set up for RW; sit to stand from bed)           Functional mobility during ADLs: Rolling walker;Supervision/safety (also set up for RW) General ADL Comments: Educated on safety such as safe footwear, use of bag on walker, rugs/items on floor, sitting for LB ADLs and to  have knee immobilizer on prior to standing to pull up LB clothing, recommended someone be with him for tub transfer.  Educated on tub transfer techniques and options for shower chair. Educated on benefit of reaching down to left foot for LB dressing as it allows knee to bend.  Explained there is AE available but pt not interested. Recommended pt not step over tub anytime soon. Wife available to assist pt at home.      Vision                     Perception     Praxis      Cognition  Awake/Alert Behavior During Therapy: WFL for tasks assessed/performed Overall Cognitive Status: Within Functional Limits for tasks assessed                       Extremity/Trunk Assessment               Exercises     Shoulder Instructions       General Comments      Pertinent Vitals/ Pain       Pain Assessment: 0-10 Pain Score:  (1-2) Pain Location: Lt knee Pain Intervention(s): Monitored during session  Home Living  Prior Functioning/Environment              Frequency Min 2X/week     Progress Toward Goals  OT Goals(current goals can now be found in the care plan section)  Progress towards OT goals: Progressing toward goals-verbalized understanding/adequate for d/c  Acute Rehab OT Goals Patient Stated Goal: not stated OT Goal Formulation: With patient/family Time For Goal Achievement: 01/30/16 Potential to Achieve Goals: Good ADL Goals Pt Will Perform Tub/Shower Transfer: Tub transfer;with supervision;ambulating;3 in 1;rolling walker  Plan Discharge plan remains appropriate    Co-evaluation                 End of Session Equipment Utilized During Treatment: Rolling walker;Left knee immobilizer   Activity Tolerance Patient tolerated treatment well   Patient Left in chair;with family/visitor present   Nurse Communication          Time: [id number] OT Time Calculation (min): 16  min  Charges: OT General Charges $OT Visit: 1 Procedure OT Treatments $Self Care/Home Management : 8-22 mins  Benito Mccreedy OTR/L [id number] 01/17/2016, 2:21 PM

## 2016-01-18 ENCOUNTER — Encounter (HOSPITAL_COMMUNITY): Payer: Self-pay | Admitting: Orthopaedic Surgery

## 2016-01-19 NOTE — Discharge Summary (Signed)
Physician Discharge Summary  Patient ID: Christian Weeks MRN: [id number] DOB/AGE: [birth year] 65 y.o.  Admit date: 01/15/2016 Discharge date: 01/19/2016  Admission Diagnoses: Osteoarthritis left knee  Discharge Diagnoses:  Active Problems:   Status post total left knee replacement   Discharged Condition: stable  Hospital Course: Patient's husband course essentially unremarkable. He underwent total knee arthroplasty was discharged to home in stable condition  Consults: None  Significant Diagnostic Studies: labs: Routine labs  Treatments: surgery: See operative note  Discharge Exam: Blood pressure 109/65, pulse 90, temperature 98.7 F (37.1 C), temperature source Oral, resp. rate 16, height 6' (1.829 m), weight 120.203 kg (265 lb), SpO2 94 %. Incision/Wound: dressing clean and dry  Disposition: 01-Home or Self Care  Discharge Instructions    Call MD / Call 911    Complete by:  As directed   If you experience chest pain or shortness of breath, CALL 911 and be transported to the hospital emergency room.  If you develope a fever above 101 F, pus (white drainage) or increased drainage or redness at the wound, or calf pain, call your surgeon's office.     Constipation Prevention    Complete by:  As directed   Drink plenty of fluids.  Prune juice may be helpful.  You may use a stool softener, such as Colace (over the counter) 100 mg twice a day.  Use MiraLax (over the counter) for constipation as needed.     Diet - low sodium heart healthy    Complete by:  As directed      Increase activity slowly as tolerated    Complete by:  As directed      Weight bearing as tolerated    Complete by:  As directed             Medication List    TAKE these medications        aspirin 325 MG EC tablet  Take 1 tablet (325 mg total) by mouth daily with breakfast.     levothyroxine 137 MCG tablet  Commonly known as:  SYNTHROID, LEVOTHROID  Take 137 mcg by mouth daily before breakfast.     losartan 100 MG tablet  Commonly known as:  COZAAR  Take 50 mg by mouth daily.     oxyCODONE-acetaminophen 5-325 MG tablet  Commonly known as:  ROXICET  Take 1 tablet by mouth every 4 (four) hours as needed for severe pain.           Follow-up Information    Schedule an appointment as soon as possible for a visit with Christian Killings, MD.   Specialty:  Orthopedic Surgery   Why:  need return office visit 2 weeks postop   Contact information:   Christian Weeks [phone]       Follow up with Christian Weeks.   Why:  Someone from [facility] will contact you concerning start date and time for therapy.   Contact information:   [address] [place] [address] [phone]       Signed: Newt Minion 01/19/2016, 6:22 AM

## 2016-01-27 DIAGNOSIS — I639 Cerebral infarction, unspecified: Secondary | ICD-10-CM

## 2016-01-27 DIAGNOSIS — I8290 Acute embolism and thrombosis of unspecified vein: Secondary | ICD-10-CM

## 2016-01-27 HISTORY — DX: Cerebral infarction, unspecified: I63.9

## 2016-01-27 HISTORY — DX: Acute embolism and thrombosis of unspecified vein: I82.90

## 2016-02-01 ENCOUNTER — Ambulatory Visit: Payer: PPO | Attending: Orthopaedic Surgery | Admitting: Physical Therapy

## 2016-02-01 ENCOUNTER — Encounter: Payer: Self-pay | Admitting: Physical Therapy

## 2016-02-01 DIAGNOSIS — M25662 Stiffness of left knee, not elsewhere classified: Secondary | ICD-10-CM

## 2016-02-01 DIAGNOSIS — M25462 Effusion, left knee: Secondary | ICD-10-CM | POA: Diagnosis present

## 2016-02-01 DIAGNOSIS — R531 Weakness: Secondary | ICD-10-CM | POA: Insufficient documentation

## 2016-02-01 DIAGNOSIS — R29898 Other symptoms and signs involving the musculoskeletal system: Secondary | ICD-10-CM | POA: Insufficient documentation

## 2016-02-01 DIAGNOSIS — R269 Unspecified abnormalities of gait and mobility: Secondary | ICD-10-CM

## 2016-02-01 NOTE — Therapy (Signed)
[facility] [place], [place], [address] Phone: [phone]   Fax:  [phone]  Physical Therapy Evaluation  Patient Details  Name: Christian Weeks MRN: [id number] Date of Birth: 07-15-1951 Referring Provider: Rodell Perna, MD  Encounter Date: 02/01/2016      PT End of Session - 02/01/16 1038    Visit Number 1   Number of Visits 18   Date for PT Re-Evaluation 03/14/16   PT Start Time 1038   PT Stop Time 1115   PT Time Calculation (min) 37 min   Activity Tolerance Patient tolerated treatment well   Behavior During Therapy [facility] for tasks assessed/performed      Past Medical History  Diagnosis Date  . Hypertension   . Hypothyroidism   . Arthritis     Past Surgical History  Procedure Laterality Date  . Knee arthroplasty Left 01/15/2016    Procedure: COMPUTER ASSISTED TOTAL KNEE ARTHROPLASTY;  Surgeon: [name], MD;  Location: [facility];  Service: Orthopedics;  Laterality: Left;    There were no vitals filed for this visit.  Visit Diagnosis:  Decreased ROM of left knee - Plan: PT plan of care cert/re-cert  Weakness - Plan: PT plan of care cert/re-cert  Swelling of knee joint, left - Plan: PT plan of care cert/re-cert  Abnormality of gait - Plan: PT plan of care cert/re-cert      Subjective Assessment - 02/01/16 1037    Subjective Patient had L TKA on 01/15/16. Patient reports some difficulty sleeping at night.    Pertinent History HTN   Limitations Other (comment)  sleeping 4 hours; normal is 6 hours   Patient Stated Goals improve movement   Currently in Pain? No/denies  pain with therapy 10/10, else none.            [facility] PT Assessment - 02/01/16 0001    Assessment   Medical Diagnosis L TKA   Referring Provider Rodell Perna, MD   Onset Date/Surgical Date 01/15/16   Next MD Visit 02/23/16   Prior Therapy n   Precautions   Precautions None   Balance Screen   Has the patient fallen in the past 6 months  No   Has the patient had a decrease in activity level because of a fear of falling?  No   Is the patient reluctant to leave their home because of a fear of falling?  No   Home Ecologist residence   Home Access Stairs to enter   Entrance Stairs-Number of Steps 2   [place] One level  two steps into living room   Prior Function   Level of Independence Independent   Vocation Self employed   Observation/Other Assessments   Focus on Therapeutic Outcomes (FOTO)  59% limited   Observation/Other Assessments-Edema    Edema Circumferential  L = 50.5 cm, R = 48 cm   ROM / Strength   AROM / PROM / Strength AROM;PROM;Strength   AROM   Overall AROM  Deficits   Overall AROM Comments L ankle DF WNL   AROM Assessment Site Knee   Right/Left Knee Left   Left Knee Extension -10   Left Knee Flexion 88   PROM   Overall PROM  Deficits   PROM Assessment Site Knee   Right/Left Knee Left   Left Knee Extension -5   Left Knee Flexion 95   Strength   Strength Assessment Site Knee   Right/Left Knee Left  Left Knee Extension 4+/5   Palpation   Patella mobility slight decrease inferiorly   Palpation comment Lateral L knee and ITB, gastroc    Ambulation/Gait   Assistive device Straight cane   Gait Pattern Decreased step length - right;Decreased stance time - left                   [facility] Adult PT Treatment/Exercise - Feb 17, 2016 0001    Exercises   Exercises Knee/Hip   Knee/Hip Exercises: Aerobic   Nustep L3 x 12 min adjusting seat from 10 to 9                PT Education - 02/17/16 1105    Education provided Yes   Education Details continue with current HEP   Person(s) Educated Patient   Methods Explanation   Comprehension Verbalized understanding;Returned demonstration          PT Short Term Goals - 02/17/16 1109    PT SHORT TERM GOAL #1   Title improved ROM in L knee to 5 to 105 (02/22/16)   Time 3   Period Weeks   Status New    PT SHORT TERM GOAL #2   Title I with initial HEP   Time 2   Period Weeks   Status New   PT SHORT TERM GOAL #3   Title able to amb community distances safely without AD (02/22/16)   Time 3   Period Weeks   Status New   PT SHORT TERM GOAL #4   Title able to sleep without waking from pain   Time 2   Period Weeks   Status New           PT Long Term Goals - 02-17-2016 1112    PT LONG TERM GOAL #1   Title I with advanced HEP   Time 6   Period Weeks   Status New   PT LONG TERM GOAL #2   Title improved L knee extesion to 0 to normalize gait   Time 6   Period Weeks   Status New   PT LONG TERM GOAL #3   Title improved L knee flexion to 120 degrees or better to normalize ADLs   Time 6   Period Weeks   Status New   PT LONG TERM GOAL #4   Title demo 5/5 L knee strength   Time 6   Period Weeks   Status New               Plan - 02-17-16 1105    Clinical Impression Statement Patient is s/p L TKA on 01/15/16. He amb with SPC with gait deviations. He has limitations in L knee ROM affecting ADLS. 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   Pt will benefit from skilled therapeutic intervention in order to improve on the following deficits Abnormal gait;Decreased range of motion;Pain;Decreased activity tolerance;Impaired flexibility;Decreased strength;Decreased mobility;Increased edema   Rehab Potential Excellent   PT Frequency 3x / week   PT Duration 6 weeks   PT Treatment/Interventions ADLs/Self Care Home Management;Cryotherapy;Electrical Stimulation;Moist Heat;Therapeutic exercise;Gait training;Neuromuscular re-education;Patient/family education;Passive range of motion   PT Next Visit Plan Nustep progress to bike; TKA protocol; modalities for edema and pain as needed.   Consulted and Agree with Plan of Care Patient          G-Codes - February 17, 2016 1226    Functional Assessment Tool Used FOTO = 59% limited   Functional  Limitation Mobility: Walking  and moving around   Mobility: Walking and Moving Around Current Status [phone]) At least 40 percent but less than 60 percent impaired, limited or restricted   Mobility: Walking and Moving Around Goal Status [phone]) At least 20 percent but less than 40 percent impaired, limited or restricted       Problem List Patient Active Problem List   Diagnosis Date Noted  . Status post total left knee replacement 01/15/2016    Madelyn Flavors PT  02/01/2016, 12:31 PM  [facility] [address] [place], [place], [address] Phone: [phone]   Fax:  [phone]  Name: LASHON DOBIAS MRN: [id number] Date of Birth: 1951/09/16

## 2016-02-03 ENCOUNTER — Encounter: Payer: Self-pay | Admitting: Physical Therapy

## 2016-02-03 ENCOUNTER — Ambulatory Visit: Payer: PPO | Admitting: Physical Therapy

## 2016-02-03 DIAGNOSIS — M25462 Effusion, left knee: Secondary | ICD-10-CM

## 2016-02-03 DIAGNOSIS — R29898 Other symptoms and signs involving the musculoskeletal system: Secondary | ICD-10-CM | POA: Diagnosis not present

## 2016-02-03 DIAGNOSIS — R531 Weakness: Secondary | ICD-10-CM

## 2016-02-03 DIAGNOSIS — M25662 Stiffness of left knee, not elsewhere classified: Secondary | ICD-10-CM

## 2016-02-03 DIAGNOSIS — R269 Unspecified abnormalities of gait and mobility: Secondary | ICD-10-CM

## 2016-02-03 NOTE — Patient Instructions (Signed)
Knee Extension Mobilization: Towel Prop   With rolled towel under right ankle, place _1-5___ pound weight across knee. Hold __5+__ minutes. Repeat __2-3__ times per set. Do __2__ sets per session. Do __2-4__ sessions per day.  Sitting knee extension stretch      Knee Flexion Stretch on Step  Place foot on step and lean forward until you feel a good stretch in front of knee.   hold 30 sec x 5-10 perform 2-4 x daily     

## 2016-02-03 NOTE — Therapy (Signed)
[facility] [place], [place], [address] Phone: [phone]   Fax:  [phone]  Physical Therapy Treatment  Patient Details  Name: Christian Weeks MRN: [id number] Date of Birth: [birth year] Referring Provider: Rodell Perna, MD  Encounter Date: 02/03/2016      PT End of Session - 02/03/16 1118    Visit Number 2   Number of Visits 18   Date for PT Re-Evaluation 03/14/16   PT Start Time 1031   PT Stop Time 1130   PT Time Calculation (min) 59 min   Activity Tolerance Patient tolerated treatment well   Behavior During Therapy [facility] for tasks assessed/performed      Past Medical History  Diagnosis Date  . Hypertension   . Hypothyroidism   . Arthritis     Past Surgical History  Procedure Laterality Date  . Knee arthroplasty Left 01/15/2016    Procedure: COMPUTER ASSISTED TOTAL KNEE ARTHROPLASTY;  Surgeon: Christian Killings, MD;  Location: [place] [facility];  Service: Orthopedics;  Laterality: Left;    There were no vitals filed for this visit.  Visit Diagnosis:  Decreased ROM of left knee  Weakness  Swelling of knee joint, left  Abnormality of gait      Subjective Assessment - 02/03/16 1037    Subjective patient reported some soreness today   Pertinent History HTN   Patient Stated Goals improve movement   Currently in Pain? Yes   Pain Score 3    Pain Location Knee   Pain Orientation Left   Pain Descriptors / Indicators Aching;Sore;Tightness   Pain Type Surgical pain   Pain Frequency Intermittent   Aggravating Factors  bending knee   Pain Relieving Factors rest            [facility] PT Assessment - 02/03/16 0001    AROM   AROM Assessment Site Knee   Right/Left Knee Left   Left Knee Extension -8   Left Knee Flexion 98   PROM   PROM Assessment Site Knee   Right/Left Knee Left   Left Knee Extension -5   Left Knee Flexion 103                     [facility] Adult PT Treatment/Exercise - 02/03/16 0001    Knee/Hip  Exercises: Stretches   Knee: Self-Stretch to increase Flexion Left;3 reps;30 seconds   Knee/Hip Exercises: Aerobic   Nustep x15 min adjusting seat to 8 for ROM   Modalities   Modalities Vasopneumatic;Electrical Stimulation   Electrical Stimulation   Electrical Stimulation Location left knee   Electrical Stimulation Action IFC   Electrical Stimulation Parameters 1-_0    Electrical Stimulation Goals Pain;Edema   Vasopneumatic   Number Minutes Vasopneumatic  15 minutes   Vasopnuematic Location  Knee   Vasopneumatic Pressure Medium   Manual Therapy   Manual Therapy Passive ROM   Passive ROM gentle strething to left knee for flexion with low load holds then ext with overpressure                PT Education - 02/03/16 1118    Education provided Yes   Education Details HEP   Person(s) Educated Patient   Methods Explanation;Demonstration;Handout   Comprehension Verbalized understanding;Returned demonstration          PT Short Term Goals - 02/03/16 1121    PT SHORT TERM GOAL #1   Title improved ROM in L knee to 5 to 105 (02/22/16)   Time 3  Period Weeks   Status On-going   PT SHORT TERM GOAL #2   Title I with initial HEP   Time 2   Period Weeks   Status Achieved   PT SHORT TERM GOAL #3   Title able to amb community distances safely without AD (02/22/16)   Time 3   Period Weeks   Status On-going   PT SHORT TERM GOAL #4   Title able to sleep without waking from pain   Time 2   Period Weeks   Status On-going           PT Long Term Goals - 02/01/16 1112    PT LONG TERM GOAL #1   Title I with advanced HEP   Time 6   Period Weeks   Status New   PT LONG TERM GOAL #2   Title improved L knee extesion to 0 to normalize gait   Time 6   Period Weeks   Status New   PT LONG TERM GOAL #3   Title improved L knee flexion to 120 degrees or better to normalize ADLs   Time 6   Period Weeks   Status New   PT LONG TERM GOAL #4   Title demo 5/5 L knee strength    Time 6   Period Weeks   Status New               Plan - 02/03/16 1122    Clinical Impression Statement Patient progressing with all activities and has improved ROM today for both flexion and ext. Patient understands HEP and elevation and ice for swelling. Patient also has wife help him with stretches. Patient met STG#1 other goals ongoing due to edema, ROM, and strength deficits.   Pt will benefit from skilled therapeutic intervention in order to improve on the following deficits Abnormal gait;Decreased range of motion;Pain;Decreased activity tolerance;Impaired flexibility;Decreased strength;Decreased mobility;Increased edema   Rehab Potential Excellent   PT Frequency 3x / week   PT Duration 6 weeks   PT Treatment/Interventions ADLs/Self Care Home Management;Cryotherapy;Electrical Stimulation;Moist Heat;Therapeutic exercise;Gait training;Neuromuscular re-education;Patient/family education;Passive range of motion   PT Next Visit Plan Nustep progress to bike; TKA protocol; modalities for edema and pain as needed.   Consulted and Agree with Plan of Care Patient        Problem List Patient Active Problem List   Diagnosis Date Noted  . Status post total left knee replacement 01/15/2016    Christian Weeks P, PTA 02/03/2016, 11:30 AM  [facility] [place], [place], [address] Phone: [phone]   Fax:  [phone]  Name: Christian Weeks MRN: [id number] Date of Birth: 07-25-1951

## 2016-02-05 ENCOUNTER — Encounter (HOSPITAL_COMMUNITY): Payer: Self-pay | Admitting: Emergency Medicine

## 2016-02-05 ENCOUNTER — Observation Stay (HOSPITAL_COMMUNITY): Payer: PPO

## 2016-02-05 ENCOUNTER — Inpatient Hospital Stay (HOSPITAL_COMMUNITY)
Admission: EM | Admit: 2016-02-05 | Discharge: 2016-02-08 | DRG: 065 | Disposition: A | Discharge status: Home / Self Care | Payer: PPO | Attending: Internal Medicine | Admitting: Internal Medicine

## 2016-02-05 ENCOUNTER — Ambulatory Visit: Payer: PPO | Admitting: Physical Therapy

## 2016-02-05 ENCOUNTER — Emergency Department (HOSPITAL_COMMUNITY): Payer: PPO

## 2016-02-05 DIAGNOSIS — I63512 Cerebral infarction due to unspecified occlusion or stenosis of left middle cerebral artery: Secondary | ICD-10-CM

## 2016-02-05 DIAGNOSIS — Q211 Atrial septal defect: Secondary | ICD-10-CM

## 2016-02-05 DIAGNOSIS — G459 Transient cerebral ischemic attack, unspecified: Secondary | ICD-10-CM | POA: Diagnosis not present

## 2016-02-05 DIAGNOSIS — R269 Unspecified abnormalities of gait and mobility: Secondary | ICD-10-CM

## 2016-02-05 DIAGNOSIS — M79662 Pain in left lower leg: Secondary | ICD-10-CM | POA: Diagnosis not present

## 2016-02-05 DIAGNOSIS — R531 Weakness: Secondary | ICD-10-CM

## 2016-02-05 DIAGNOSIS — E785 Hyperlipidemia, unspecified: Secondary | ICD-10-CM | POA: Insufficient documentation

## 2016-02-05 DIAGNOSIS — M25662 Stiffness of left knee, not elsewhere classified: Secondary | ICD-10-CM

## 2016-02-05 DIAGNOSIS — Q2112 Patent foramen ovale: Secondary | ICD-10-CM | POA: Insufficient documentation

## 2016-02-05 DIAGNOSIS — I63412 Cerebral infarction due to embolism of left middle cerebral artery: Principal | ICD-10-CM | POA: Diagnosis present

## 2016-02-05 DIAGNOSIS — Z96652 Presence of left artificial knee joint: Secondary | ICD-10-CM | POA: Diagnosis present

## 2016-02-05 DIAGNOSIS — G8191 Hemiplegia, unspecified affecting right dominant side: Secondary | ICD-10-CM | POA: Diagnosis present

## 2016-02-05 DIAGNOSIS — I10 Essential (primary) hypertension: Secondary | ICD-10-CM | POA: Diagnosis present

## 2016-02-05 DIAGNOSIS — M25462 Effusion, left knee: Secondary | ICD-10-CM | POA: Diagnosis present

## 2016-02-05 DIAGNOSIS — M79669 Pain in unspecified lower leg: Secondary | ICD-10-CM | POA: Diagnosis present

## 2016-02-05 DIAGNOSIS — R29700 NIHSS score 0: Secondary | ICD-10-CM | POA: Diagnosis present

## 2016-02-05 DIAGNOSIS — I639 Cerebral infarction, unspecified: Secondary | ICD-10-CM | POA: Diagnosis present

## 2016-02-05 DIAGNOSIS — M1990 Unspecified osteoarthritis, unspecified site: Secondary | ICD-10-CM | POA: Diagnosis present

## 2016-02-05 DIAGNOSIS — R4781 Slurred speech: Secondary | ICD-10-CM | POA: Diagnosis present

## 2016-02-05 DIAGNOSIS — E781 Pure hyperglyceridemia: Secondary | ICD-10-CM | POA: Diagnosis present

## 2016-02-05 DIAGNOSIS — R29810 Facial weakness: Secondary | ICD-10-CM | POA: Diagnosis present

## 2016-02-05 DIAGNOSIS — Z7982 Long term (current) use of aspirin: Secondary | ICD-10-CM

## 2016-02-05 DIAGNOSIS — Z96651 Presence of right artificial knee joint: Secondary | ICD-10-CM

## 2016-02-05 DIAGNOSIS — I82402 Acute embolism and thrombosis of unspecified deep veins of left lower extremity: Secondary | ICD-10-CM | POA: Diagnosis present

## 2016-02-05 DIAGNOSIS — R29898 Other symptoms and signs involving the musculoskeletal system: Secondary | ICD-10-CM | POA: Diagnosis present

## 2016-02-05 DIAGNOSIS — E039 Hypothyroidism, unspecified: Secondary | ICD-10-CM | POA: Diagnosis not present

## 2016-02-05 LAB — DIFFERENTIAL
BASOS ABS: 0.1 10*3/uL (ref 0.0–0.1)
BASOS PCT: 1 %
Eosinophils Absolute: 0.6 10*3/uL (ref 0.0–0.7)
Eosinophils Relative: 7 %
Lymphocytes Relative: 45 %
Lymphs Abs: 3.4 10*3/uL (ref 0.7–4.0)
MONOS PCT: 9 %
Monocytes Absolute: 0.7 10*3/uL (ref 0.1–1.0)
NEUTROS ABS: 2.9 10*3/uL (ref 1.7–7.7)
Neutrophils Relative %: 38 %

## 2016-02-05 LAB — COMPREHENSIVE METABOLIC PANEL
ALT: 16 U/L — AB (ref 17–63)
AST: 27 U/L (ref 15–41)
Albumin: 3.5 g/dL (ref 3.5–5.0)
Alkaline Phosphatase: 94 U/L (ref 38–126)
Anion gap: 11 (ref 5–15)
BUN: 12 mg/dL (ref 6–20)
CHLORIDE: 103 mmol/L (ref 101–111)
CO2: 25 mmol/L (ref 22–32)
CREATININE: 1.21 mg/dL (ref 0.61–1.24)
Calcium: 9.2 mg/dL (ref 8.9–10.3)
GFR calc Af Amer: >60 mL/min (ref >60)
GLUCOSE: 120 mg/dL — AB (ref 65–99)
Potassium: 4.1 mmol/L (ref 3.5–5.1)
Sodium: 139 mmol/L (ref 135–145)
Total Bilirubin: 0.5 mg/dL (ref 0.3–1.2)
Total Protein: 6.7 g/dL (ref 6.5–8.1)

## 2016-02-05 LAB — CBC
HCT: 39.6 % (ref 39.0–52.0)
Hemoglobin: 13.2 g/dL (ref 13.0–17.0)
MCH: 29.9 pg (ref 26.0–34.0)
MCHC: 33.3 g/dL (ref 30.0–36.0)
MCV: 89.8 fL (ref 78.0–100.0)
Platelets: 289 10*3/uL (ref 150–400)
RBC: 4.41 MIL/uL (ref 4.22–5.81)
RDW: 14.3 % (ref 11.5–15.5)
WBC: 7.6 10*3/uL (ref 4.0–10.5)

## 2016-02-05 LAB — I-STAT CHEM 8, ED
BUN: 14 mg/dL (ref 6–20)
CHLORIDE: 103 mmol/L (ref 101–111)
CREATININE: 1.1 mg/dL (ref 0.61–1.24)
Calcium, Ion: 1.12 mmol/L — ABNORMAL LOW (ref 1.13–1.30)
Glucose, Bld: 114 mg/dL — ABNORMAL HIGH (ref 65–99)
HEMATOCRIT: 43 % (ref 39.0–52.0)
Hemoglobin: 14.6 g/dL (ref 13.0–17.0)
POTASSIUM: 4 mmol/L (ref 3.5–5.1)
Sodium: 140 mmol/L (ref 135–145)
TCO2: 24 mmol/L (ref 0–100)

## 2016-02-05 LAB — PROTIME-INR
INR: 1.19 (ref 0.00–1.49)
PROTHROMBIN TIME: 15.2 s (ref 11.6–15.2)

## 2016-02-05 LAB — I-STAT TROPONIN, ED: Troponin i, poc: 0 ng/mL (ref 0.00–0.08)

## 2016-02-05 LAB — LIPID PANEL
CHOL/HDL RATIO: 5.4 ratio
Cholesterol: 141 mg/dL (ref 0–200)
HDL: 26 mg/dL — AB (ref >40)
LDL CALC: 55 mg/dL (ref 0–99)
Triglycerides: 300 mg/dL — ABNORMAL HIGH (ref <150)
VLDL: 60 mg/dL — ABNORMAL HIGH (ref 0–40)

## 2016-02-05 LAB — APTT: APTT: 32 s (ref 24–37)

## 2016-02-05 LAB — CBG MONITORING, ED: Glucose-Capillary: 109 mg/dL — ABNORMAL HIGH (ref 65–99)

## 2016-02-05 MED ORDER — SODIUM CHLORIDE 0.9 % IV SOLN
INTRAVENOUS | Status: DC
  Administered 2016-02-05: 20:00:00 via INTRAVENOUS

## 2016-02-05 MED ORDER — SODIUM CHLORIDE 0.9 % IV SOLN
INTRAVENOUS | Status: DC
  Administered 2016-02-05: 23:00:00 via INTRAVENOUS

## 2016-02-05 MED ORDER — SENNOSIDES-DOCUSATE SODIUM 8.6-50 MG PO TABS
1.0000 | ORAL_TABLET | Freq: Every evening | ORAL | Status: DC | PRN
Start: 2016-02-05 — End: 2016-02-08

## 2016-02-05 MED ORDER — LEVOTHYROXINE SODIUM 137 MCG PO TABS
137.0000 ug | ORAL_TABLET | Freq: Every day | ORAL | Status: DC
  Administered 2016-02-06 – 2016-02-08 (×3): 137 ug via ORAL
  Filled 2016-02-05 (×4): qty 1

## 2016-02-05 MED ORDER — ASPIRIN 300 MG RE SUPP: 300.0000 mg | Freq: Every day | RECTAL | Status: DC

## 2016-02-05 MED ORDER — LOSARTAN POTASSIUM 50 MG PO TABS: 50.0000 mg | ORAL_TABLET | Freq: Every day | ORAL | Status: DC

## 2016-02-05 MED ORDER — ENOXAPARIN SODIUM 40 MG/0.4ML ~~LOC~~ SOLN
40.0000 mg | Freq: Every day | SUBCUTANEOUS | Status: DC
  Administered 2016-02-05: 40 mg via SUBCUTANEOUS
  Filled 2016-02-05: qty 0.4

## 2016-02-05 MED ORDER — OXYCODONE-ACETAMINOPHEN 5-325 MG PO TABS
1.0000 | ORAL_TABLET | Freq: Four times a day (QID) | ORAL | Status: DC | PRN
  Administered 2016-02-07: 1 via ORAL
  Filled 2016-02-05: qty 1

## 2016-02-05 MED ORDER — STROKE: EARLY STAGES OF RECOVERY BOOK
Freq: Once | Status: AC
  Administered 2016-02-05: 23:00:00
  Filled 2016-02-05: qty 1

## 2016-02-05 MED ORDER — ASPIRIN EC 325 MG PO TBEC
325.0000 mg | DELAYED_RELEASE_TABLET | Freq: Every day | ORAL | Status: DC
  Administered 2016-02-05: 325 mg via ORAL
  Filled 2016-02-05: qty 1

## 2016-02-05 MED ORDER — SODIUM CHLORIDE 0.9 % IV SOLN
INTRAVENOUS | Status: DC
Start: 2016-02-05 — End: 2016-02-05

## 2016-02-05 MED ORDER — ASPIRIN 325 MG PO TABS
325.0000 mg | ORAL_TABLET | Freq: Every day | ORAL | Status: DC
  Administered 2016-02-06: 325 mg via ORAL
  Filled 2016-02-05: qty 1

## 2016-02-05 NOTE — Consult Note (Addendum)
Requesting Physician: Dr.  Alvino Chapel    Reason for consultation: Stroke code  HPI:                                                                                                                                         Christian Weeks is an 65 y.o. male patient who presented presented to the emergency room via EMS for further evaluation of strokelike symptoms. At about 5:30 PM when he was having dinner with family, he was noted to have a sudden onset of slurred speech and right-sided weakness symptoms. 911 was called and EMS brought him into the ER, stroke code was called and patient was examined at about 7:30 PM in the ER when he arrived. Per EMS, his symptoms have significantly improvement when they arrived at his house. Initial blood sugars 103, and blood pressure [id number] systolic per EMS. Patient reports that his symptoms have resolved at this time. No focal sensory or motor symptoms now . Had some mild numbness paresthesias in the right upper extremity at the symptom onset in addition to feeling of weakness in the right arm and slurred speech . Denies vision symptoms, no symptoms involving lower extremities or left arm , denies any new or worsening balance problems . Number had prior strokes or TIAs . He reported gradual worsening of his gait for the past couple of years but nothing drastic today .   Date last known well:  02/05/16 Time last known well:  1730 tPA Given: No: Symptoms resolved, NIHSS 0.   Stroke Risk Factors - hypertension  Past Medical History: Past Medical History  Diagnosis Date  . Hypertension   . Hypothyroidism   . Arthritis     Past Surgical History  Procedure Laterality Date  . Knee arthroplasty Left 01/15/2016    Procedure: COMPUTER ASSISTED TOTAL KNEE ARTHROPLASTY;  Surgeon: [name], MD;  Location: [facility];  Service: Orthopedics;  Laterality: Left;    Family History: History reviewed. No pertinent family history.  Social History:   reports that he has never  smoked. He has never used smokeless tobacco. He reports that he does not drink alcohol or use illicit drugs.  Allergies:  No Known Allergies   Medications:  Current facility-administered medications:  .  0.9 %  sodium chloride infusion, , Intravenous, Continuous, Rhyder Bratz Fuller Mandril, MD, Last Rate: 30 mL/hr at 02/05/16 2000 .  aspirin EC tablet 325 mg, 325 mg, Oral, Daily, Martin Belling Fuller Mandril, MD, 325 mg at 02/05/16 [id number]  Current outpatient prescriptions:  .  acetaminophen (TYLENOL) 500 MG tablet, Take 1,000 mg by mouth every 6 (six) hours as needed for moderate pain., Disp: , Rfl:  .  levothyroxine (SYNTHROID, LEVOTHROID) 137 MCG tablet, Take 137 mcg by mouth daily before breakfast., Disp: , Rfl:  .  losartan (COZAAR) 100 MG tablet, Take 50 mg by mouth daily., Disp: , Rfl:  .  aspirin EC 325 MG EC tablet, Take 1 tablet (325 mg total) by mouth daily with breakfast. (Patient not taking: Reported on 02/01/2016), Disp: 30 tablet, Rfl: 0 .  oxyCODONE-acetaminophen (ROXICET) 5-325 MG tablet, Take 1 tablet by mouth every 4 (four) hours as needed for severe pain. (Patient not taking: Reported on 02/01/2016), Disp: 60 tablet, Rfl: 0   ROS:                                                                                                                                       History obtained from the patient  General ROS: negative for - chills, fatigue, fever, night sweats, weight gain or weight loss Psychological ROS: negative for - behavioral disorder, hallucinations, memory difficulties, mood swings or suicidal ideation Ophthalmic ROS: negative for - blurry vision, double vision, eye pain or loss of vision ENT ROS: negative for - epistaxis, nasal discharge, oral lesions, sore throat, tinnitus or vertigo Allergy and Immunology ROS: negative for - hives or itchy/watery  eyes Hematological and Lymphatic ROS: negative for - bleeding problems, bruising or swollen lymph nodes Endocrine ROS: negative for - galactorrhea, hair pattern changes, polydipsia/polyuria or temperature intolerance Respiratory ROS: negative for - cough, hemoptysis, shortness of breath or wheezing Cardiovascular ROS: negative for - chest pain, dyspnea on exertion, edema or irregular heartbeat Gastrointestinal ROS: negative for - abdominal pain, diarrhea, hematemesis, nausea/vomiting or stool incontinence Genito-Urinary ROS: negative for - dysuria, hematuria, incontinence or urinary frequency/urgency Musculoskeletal ROS: negative for - joint swelling or muscular weakness Neurological ROS: as noted in HPI Dermatological ROS: negative for rash and skin lesion changes  Neurologic Examination:                                                                                                      Blood pressure  143/103, pulse 73, temperature 98.3 F (36.8 C), temperature source Oral, SpO2 96 %.  Evaluation of higher integrative functions including: Level of alertness: Alert,  Oriented to time, place and person Speech: fluent, no evidence of dysarthria or aphasia noted.  Test the following cranial nerves: 2-12 grossly intact Motor examination: mild rigidity in the left upper extremity with cogwheeling at the elbow , normal tone on the right side with, full 5/5 motor strength in all 4 extremities Examination of sensation :  symmetric sensation to pinprick in all 4 extremities and on face Examination of deep tendon reflexes: 2+, normal and symmetric in all extremities, normal plantars bilaterally Test coordination: Normal finger nose testing, with no evidence of limb appendicular ataxia or abnormal involuntary movements or tremors noted.  Gait: He has a mildly stooped posture with wide-based gait, slow pace of walking, but no significant gait ataxia noted .   Lab Results: Basic Metabolic  Panel:  Recent Labs Lab 02/05/16 1930  NA 140  K 4.0  CL 103  GLUCOSE 114*  BUN 14  CREATININE 1.10    Liver Function Tests: No results for input(s): AST, ALT, ALKPHOS, BILITOT, PROT, ALBUMIN in the last 168 hours. No results for input(s): LIPASE, AMYLASE in the last 168 hours. No results for input(s): AMMONIA in the last 168 hours.  CBC:  Recent Labs Lab 02/05/16 1915 02/05/16 1930  WBC 7.6  --   NEUTROABS 2.9  --   HGB 13.2 14.6  HCT 39.6 43.0  MCV 89.8  --   PLT 289  --     Cardiac Enzymes: No results for input(s): CKTOTAL, CKMB, CKMBINDEX, TROPONINI in the last 168 hours.  Lipid Panel: No results for input(s): CHOL, TRIG, HDL, CHOLHDL, VLDL, LDLCALC in the last 168 hours.  CBG:  Recent Labs Lab 02/05/16 1939  GLUCAP 109*    Microbiology: Results for orders placed or performed during the hospital encounter of 01/06/16  Surgical pcr screen     Status: None   Collection Time: 01/06/16  9:51 AM  Result Value Ref Range Status   MRSA, PCR NEGATIVE NEGATIVE Final   Staphylococcus aureus NEGATIVE NEGATIVE Final    Comment:        The Xpert SA Assay (FDA approved for NASAL specimens in patients over 37 years of age), is one component of a comprehensive surveillance program.  Test performance has been validated by [facility] for patients greater than or equal to 58 year old. It is not intended to diagnose infection nor to guide or monitor treatment.      Imaging: Ct Head Wo Contrast  02/05/2016  ADDENDUM REPORT: 02/05/2016 19:49 ADDENDUM: These results were called by telephone at the time of interpretation on 02/05/2016 at 7:40 pm to nurse Gilda Crease, who verbally acknowledged these results. Electronically Signed   By: Anner Crete M.D.   On: 02/05/2016 19:49  02/05/2016  CLINICAL DATA:  65 year old male with right arm weakness and slurred speech. Code stroke. EXAM: CT HEAD WITHOUT CONTRAST TECHNIQUE: Contiguous axial images were obtained from the  base of the skull through the vertex without intravenous contrast. COMPARISON:  None. FINDINGS: There is mild prominence of the ventricles and sulci compatible with age-related atrophy. Mild periventricular and deep white matter chronic microvascular ischemic changes noted. There is no acute intracranial hemorrhage. No mass effect or midline shift noted. There is mild mucoperiosteal thickening of the paranasal sinuses with partial opacification of the ethmoid air cells. The mastoid air cells are well aerated. IMPRESSION:  No acute intracranial hemorrhage. Mild age-related atrophy and chronic microvascular ischemic disease. If symptoms persist and there are no contraindications, MRI may provide better evaluation if clinically indicated Electronically Signed: By: Anner Crete M.D. On: 02/05/2016 19:41    Assessment and plan:   SANDRO CRISPIN is an 65 y.o. male patient who presented to the ER via EMS for further evaluation of new onset stroke-like symptoms involving slurred speech, right upper extremity numbness and weakness. The symptoms are completely resolved by the time he presented to the ER, with nonfocal neurological examination . NIHSS 0. Hence, he is not considered a candidate for IV TPA due to complete resolution of his symptoms.  But given his age and vascular risk factors with hypertension, cannot exclude a transient ischemic attack. CT of the head is negative. Recommend further neurodiagnostic workup with MRI of the brain, and MRA of the head, ultrasound of the carotids, echocardiogram with bubble study and will be placed on cardiac telemetry overnight. He'll be admitted to hospitalist service for observation. Patient does not take aspirin at home. Started aspirin 325 mg daily with the first dose in the ER. He was started on IV fluids at 30 mL an hour, his blood pressure in the ER is around [id number] to 0000000 systolic.  Ordered A1c, lipid profile. He does appear to have mild chronic gait instability at  baseline.  He just had a left knee replacement surgery done 3 weeks ago and has been undergoing physical therapy as outpatient . Recommend continued physical therapy for gait and balance training and falls prevention.   Stroke team will continue to follow starting tomorrow morning.  please call for any further questions.     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 No large vessel occlusions were seen on MRA of head.         Re-examined the patient following the MRI scan at 2130. He continues to be asymptomatic. No focal neurological deficits noted. No dysarthria or aphasia. No motor weakness in upper or lower extremities, no sensory deficits. NIHSS remains 0.   Reviewed the MRI images with the patient, his wife and son at bedside, explained the findings including the small left MCA embolic infarct noted. Also discussed about acute thrombolytic therapy, but given that he remains asymptomatic, with NIHSS 0, he is not a candidate for acute thrombolytic therapy. Patient and family agreed with the plan following this discussion. No vascular occlusions noted on MRA for any acute intervention or thrombectomy. Based on imaging appearance, this infarct appears most likely cardiac embolic in etiology. Ordered normal saline at 100 mL an hour, to allow permissive hypertension. He received aspirin 325 mg in the ER. He is placed on cardiac telemetry. Answered several of patient's and his family's questions to their satisfaction.

## 2016-02-05 NOTE — ED Provider Notes (Signed)
CSN: [id number]     Arrival date & time 02/05/16  1908 History   First MD Initiated Contact with Patient 02/05/16 1915     Chief Complaint  Patient presents with  . Transient Ischemic Attack    @EDPCLEARED @  The history is provided by the patient.   patient came in as a code stroke. Reportedly at 5:30 developed difficulty moving his right hand. States he had lifted up with his left hand. He is left-handed. Also had difficulty speaking. Tried to wait it out but EMS was eventually called. Upon arrival symptoms have pretty much resolved. No headache. No history of stroke. History of hypertension. No confusion. No fevers. No abdominal pain. He is not on anticoagulation. He has had recent knee surgery.  Past Medical History  Diagnosis Date  . Hypertension   . Hypothyroidism   . Arthritis    Past Surgical History  Procedure Laterality Date  . Knee arthroplasty Left 01/15/2016    Procedure: COMPUTER ASSISTED TOTAL KNEE ARTHROPLASTY;  Surgeon: [name], MD;  Location: [place];  Service: Orthopedics;  Laterality: Left;   Family History  Problem Relation Age of Onset  . Congestive Heart Failure Mother   . Diabetes Mother   . Thyroid disease Sister    Social History  Substance Use Topics  . Smoking status: Never Smoker   . Smokeless tobacco: Never Used  . Alcohol Use: No    Review of Systems  Constitutional: Negative for activity change and appetite change.  Eyes: Negative for pain.  Respiratory: Negative for chest tightness and shortness of breath.   Cardiovascular: Negative for chest pain and leg swelling.  Gastrointestinal: Negative for nausea, vomiting, abdominal pain and diarrhea.  Genitourinary: Negative for flank pain.  Musculoskeletal: Negative for back pain and neck stiffness.  Skin: Negative for rash.  Neurological: Positive for speech difficulty and weakness. Negative for numbness and headaches.  Psychiatric/Behavioral: Negative for behavioral problems.       Allergies  Review of patient's allergies indicates no known allergies.  Home Medications   Prior to Admission medications   Medication Sig Start Date End Date Taking? Authorizing Provider  acetaminophen (TYLENOL) 500 MG tablet Take 1,000 mg by mouth every 6 (six) hours as needed for moderate pain.   Yes Historical Provider, MD  levothyroxine (SYNTHROID, LEVOTHROID) 137 MCG tablet Take 137 mcg by mouth daily before breakfast.   Yes Historical Provider, MD  losartan (COZAAR) 100 MG tablet Take 50 mg by mouth daily.   Yes Historical Provider, MD  aspirin EC 325 MG EC tablet Take 1 tablet (325 mg total) by mouth daily with breakfast. Patient not taking: Reported on 02/01/2016 01/17/16   Newt Minion, MD  oxyCODONE-acetaminophen (ROXICET) 5-325 MG tablet Take 1 tablet by mouth every 4 (four) hours as needed for severe pain. Patient not taking: Reported on 02/01/2016 01/17/16   Newt Minion, MD   BP 129/93 mmHg  Pulse 90  Temp(Src) 98.2 F (36.8 C) (Oral)  Resp 18  SpO2 98% Physical Exam  Constitutional: He is oriented to person, place, and time. He appears well-developed and well-nourished.  HENT:  Head: Normocephalic and atraumatic.  Eyes: EOM are normal. Pupils are equal, round, and reactive to light.  Neck: Normal range of motion. Neck supple.  Cardiovascular: Normal rate, regular rhythm and normal heart sounds.   No murmur heard. Pulmonary/Chest: Effort normal and breath sounds normal.  Abdominal: Soft. Bowel sounds are normal. He exhibits no distension and no mass. There is  no tenderness. There is no rebound and no guarding.  Musculoskeletal: Normal range of motion. He exhibits no edema.  Neurological: He is alert and oriented to person, place, and time. No cranial nerve deficit.  Good grip strength bilaterally. Normal speech. Symmetric face. Complete NIH scoring done by neurology.  Skin: Skin is warm and dry.  Psychiatric: He has a normal mood and affect.  Nursing note and  vitals reviewed.   ED Course  Procedures (including critical care time) Labs Review Labs Reviewed  COMPREHENSIVE METABOLIC PANEL - Abnormal; Notable for the following:    Glucose, Bld 120 (*)    ALT 16 (*)    All other components within normal limits  LIPID PANEL - Abnormal; Notable for the following:    Triglycerides 300 (*)    HDL 26 (*)    VLDL 60 (*)    All other components within normal limits  CBG MONITORING, ED - Abnormal; Notable for the following:    Glucose-Capillary 109 (*)    All other components within normal limits  I-STAT CHEM 8, ED - Abnormal; Notable for the following:    Glucose, Bld 114 (*)    Calcium, Ion 1.12 (*)    All other components within normal limits  PROTIME-INR  APTT  CBC  DIFFERENTIAL  HEMOGLOBIN A1C  TSH  URINE RAPID DRUG SCREEN, HOSP PERFORMED  VITAMIN B12  FOLATE  RPR  HIV ANTIBODY (ROUTINE TESTING)  I-STAT TROPOININ, ED    Imaging Review Ct Head Wo Contrast  02/05/2016  ADDENDUM REPORT: 02/05/2016 19:49 ADDENDUM: These results were called by telephone at the time of interpretation on 02/05/2016 at 7:40 pm to nurse Gilda Crease, who verbally acknowledged these results. Electronically Signed   By: Anner Crete M.D.   On: 02/05/2016 19:49  02/05/2016  CLINICAL DATA:  65 year old male with right arm weakness and slurred speech. Code stroke. EXAM: CT HEAD WITHOUT CONTRAST TECHNIQUE: Contiguous axial images were obtained from the base of the skull through the vertex without intravenous contrast. COMPARISON:  None. FINDINGS: There is mild prominence of the ventricles and sulci compatible with age-related atrophy. Mild periventricular and deep white matter chronic microvascular ischemic changes noted. There is no acute intracranial hemorrhage. No mass effect or midline shift noted. There is mild mucoperiosteal thickening of the paranasal sinuses with partial opacification of the ethmoid air cells. The mastoid air cells are well aerated. IMPRESSION: No  acute intracranial hemorrhage. Mild age-related atrophy and chronic microvascular ischemic disease. If symptoms persist and there are no contraindications, MRI may provide better evaluation if clinically indicated Electronically Signed: By: Anner Crete M.D. On: 02/05/2016 19:41   Mr [name] Head Wo Contrast  02/05/2016  CLINICAL DATA:  Initial evaluation for acute right-sided weakness. EXAM: MRI HEAD WITHOUT CONTRAST MRA HEAD WITHOUT CONTRAST TECHNIQUE: Multiplanar, multiecho pulse sequences of the brain and surrounding structures were obtained without intravenous contrast. Angiographic images of the head were obtained using MRA technique without contrast. COMPARISON:  Prior CT from earlier the same day. FINDINGS: MRI HEAD FINDINGS Diffuse prominence of the CSF containing spaces is compatible with moderate age-related cerebral atrophy. Patchy T2/FLAIR hyperintensity within the periventricular and deep white matter both cerebral hemispheres most consistent with chronic small vessel ischemic disease, fairly mild nature. Chronic small vessel type changes present within the pons. Probable small remote lacunar infarcts within the central pons. There is patchy and linear areas of restricted diffusion involving the mid/posterior left frontal lobe (series 5, image 16). This involves the cortical gray matter  and underlying subcortical/ deep white matter. An additional area of patchy restricted diffusion involves the cortical gray matter of the left occipital lobe (series 4, image 23). No other areas of acute infarction. No associated mass effect or hemorrhage. Major intracranial vascular flow voids are maintained. No mass lesion, midline shift, or mass effect. No hydrocephalus. No extra-axial fluid collection. Craniocervical junction within normal limits. Pituitary gland unremarkable. No acute abnormality about the orbits. Mild mucosal thickening throughout the paranasal sinuses with scattered opacity within the left  ethmoidal air cells. No significant mastoid effusion. Inner ear structures grossly normal. Bone marrow signal intensity within normal limits. No scalp soft tissue abnormality. MRA HEAD FINDINGS ANTERIOR CIRCULATION: Visualized distal cervical segments of the internal carotid arteries are mildly tortuous but widely patent with antegrade flow. Petrous, cavernous, supraclinoid segments are widely patent. A1 segments, anterior communicating artery, and anterior cerebral arteries well opacified. M1 segments widely patent without stenosis or occlusion. MCA bifurcations within normal limits. Distal MCA branches fairly symmetric and well opacified. POSTERIOR CIRCULATION: Vertebral arteries patent to the vertebrobasilar junction. Right posterior inferior cerebral artery patent. Left posterior inferior cerebral artery not visualized. Basilar artery mildly tortuous but widely patent to its distal aspect. Superior cerebellar arteries well opacified bilaterally. Both of the posterior cerebral arteries arise knee basilar artery and are well opacified to their distal aspects. A small left posterior communicating artery noted. No aneurysm or vascular malformation. IMPRESSION: MRI HEAD IMPRESSION: 1. 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 No associated mass-effect. 2. Moderate generalized cerebral atrophy with mild chronic small vessel ischemic disease. MRA HEAD IMPRESSION: Negative intracranial MRA. No large or proximal arterial branch occlusion. No high-grade or correctable stenosis. Electronically Signed   By: Jeannine Boga M.D.   On: 02/05/2016 22:40   Mr Brain Wo Contrast  02/05/2016  CLINICAL DATA:  Initial evaluation for acute right-sided weakness. EXAM: MRI HEAD WITHOUT CONTRAST MRA HEAD WITHOUT CONTRAST TECHNIQUE: Multiplanar, multiecho pulse sequences of the brain and surrounding structures were obtained without intravenous contrast.  Angiographic images of the head were obtained using MRA technique without contrast. COMPARISON:  Prior CT from earlier the same day. FINDINGS: MRI HEAD FINDINGS Diffuse prominence of the CSF containing spaces is compatible with moderate age-related cerebral atrophy. Patchy T2/FLAIR hyperintensity within the periventricular and deep white matter both cerebral hemispheres most consistent with chronic small vessel ischemic disease, fairly mild nature. Chronic small vessel type changes present within the pons. Probable small remote lacunar infarcts within the central pons. There is patchy and linear areas of restricted diffusion involving the mid/posterior left frontal lobe (series 5, image 16). This involves the cortical gray matter and underlying subcortical/ deep white matter. An additional area of patchy restricted diffusion involves the cortical gray matter of the left occipital lobe (series 4, image 23). No other areas of acute infarction. No associated mass effect or hemorrhage. Major intracranial vascular flow voids are maintained. No mass lesion, midline shift, or mass effect. No hydrocephalus. No extra-axial fluid collection. Craniocervical junction within normal limits. Pituitary gland unremarkable. No acute abnormality about the orbits. Mild mucosal thickening throughout the paranasal sinuses with scattered opacity within the left ethmoidal air cells. No significant mastoid effusion. Inner ear structures grossly normal. Bone marrow signal intensity within normal limits. No scalp soft tissue abnormality. MRA HEAD FINDINGS ANTERIOR CIRCULATION: Visualized distal cervical segments of the internal carotid arteries are mildly tortuous but widely patent with antegrade flow. Petrous, cavernous, supraclinoid segments are  widely patent. A1 segments, anterior communicating artery, and anterior cerebral arteries well opacified. M1 segments widely patent without stenosis or occlusion. MCA bifurcations within normal  limits. Distal MCA branches fairly symmetric and well opacified. POSTERIOR CIRCULATION: Vertebral arteries patent to the vertebrobasilar junction. Right posterior inferior cerebral artery patent. Left posterior inferior cerebral artery not visualized. Basilar artery mildly tortuous but widely patent to its distal aspect. Superior cerebellar arteries well opacified bilaterally. Both of the posterior cerebral arteries arise knee basilar artery and are well opacified to their distal aspects. A small left posterior communicating artery noted. No aneurysm or vascular malformation. IMPRESSION: MRI HEAD IMPRESSION: 1. 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 No associated mass-effect. 2. Moderate generalized cerebral atrophy with mild chronic small vessel ischemic disease. MRA HEAD IMPRESSION: Negative intracranial MRA. No large or proximal arterial branch occlusion. No high-grade or correctable stenosis. Electronically Signed   By: Jeannine Boga M.D.   On: 02/05/2016 22:40   I have personally reviewed and evaluated these images and lab results as part of my medical decision-making.   EKG Interpretation   Date/Time:  Friday February 05 2016 19:40:29 EST Ventricular Rate:  72 PR Interval:  174 QRS Duration: 110 QT Interval:  408 QTC Calculation: 446 R Axis:   -30 Text Interpretation:  Sinus rhythm Left axis deviation Confirmed by  Brittan Butterbaugh  MD, Emmeline Winebarger [phone]) on 02/06/2016 12:48:24 AM      MDM   Final diagnoses:  Transient cerebral ischemia, unspecified transient cerebral ischemia type    Patient with right-sided weakness and difficulty speaking that had resolved. Came in as a code stroke but not TPA candidate due to having symptoms. Seen by neurology. Admit to internal medicine.    Davonna Belling, MD 02/06/16 [id number]

## 2016-02-05 NOTE — Progress Notes (Signed)
Pt arrived to unit alert and oriented. Oriented to room. Call bell at side. Bed alarm activated. Will continue to monitor.

## 2016-02-05 NOTE — Therapy (Signed)
[facility] [place], [place], [address] Phone: [phone]   Fax:  [phone]  Physical Therapy Treatment  Patient Details  Name: Christian Weeks MRN: [id number] Date of Birth: September 13, 1951 Referring Provider: Rodell Perna, MD  Encounter Date: 02/05/2016      PT End of Session - 02/05/16 1429    Visit Number 3   Number of Visits 18   Date for PT Re-Evaluation 03/14/16   PT Start Time 1030   PT Stop Time 1120   PT Time Calculation (min) 50 min   Activity Tolerance Patient tolerated treatment well   Behavior During Therapy [facility] for tasks assessed/performed      Past Medical History  Diagnosis Date  . Hypertension   . Hypothyroidism   . Arthritis     Past Surgical History  Procedure Laterality Date  . Knee arthroplasty Left 01/15/2016    Procedure: COMPUTER ASSISTED TOTAL KNEE ARTHROPLASTY;  Surgeon: [name], MD;  Location: [place];  Service: Orthopedics;  Laterality: Left;    There were no vitals filed for this visit.  Visit Diagnosis:  Decreased ROM of left knee  Weakness  Swelling of knee joint, left  Abnormality of gait                       [facility] Adult PT Treatment/Exercise - 02/05/16 0001    Exercises   Exercises Knee/Hip   Knee/Hip Exercises: Aerobic   Nustep Nustep x 21 minutes moving forward times 2 to increase left knee flexion.   Acupuncturist Location Left knee.   Electrical Stimulation Action --  IFC   Electrical Stimulation Parameters --  1-10 Hz. x 15 minutes.   Electrical Stimulation Goals Edema;Pain   Vasopneumatic   Number Minutes Vasopneumatic  15 minutes   Vasopnuematic Location  --  Left knee.   Vasopneumatic Pressure --  Medium.   Manual Therapy   Manual Therapy Passive ROM   Passive ROM PROM into left knee flexion and extension in supine x 5 minutes.                  PT Short Term Goals - 02/03/16 1121    PT  SHORT TERM GOAL #1   Title improved ROM in L knee to 5 to 105 (02/22/16)   Time 3   Period Weeks   Status On-going   PT SHORT TERM GOAL #2   Title I with initial HEP   Time 2   Period Weeks   Status Achieved   PT SHORT TERM GOAL #3   Title able to amb community distances safely without AD (02/22/16)   Time 3   Period Weeks   Status On-going   PT SHORT TERM GOAL #4   Title able to sleep without waking from pain   Time 2   Period Weeks   Status On-going           PT Long Term Goals - 02/01/16 1112    PT LONG TERM GOAL #1   Title I with advanced HEP   Time 6   Period Weeks   Status New   PT LONG TERM GOAL #2   Title improved L knee extesion to 0 to normalize gait   Time 6   Period Weeks   Status New   PT LONG TERM GOAL #3   Title improved L knee flexion to 120 degrees or better to normalize ADLs  Time 6   Period Weeks   Status New   PT LONG TERM GOAL #4   Title demo 5/5 L knee strength   Time 6   Period Weeks   Status New               Problem List Patient Active Problem List   Diagnosis Date Noted  . Status post total left knee replacement 01/15/2016    Kaye Mitro, [place] MPT 02/05/2016, 2:41 PM  [facility] [address] [place], [place], [address] Phone: [phone]   Fax:  [phone]  Name: Christian Weeks MRN: [id number] Date of Birth: Oct 02, 1951

## 2016-02-05 NOTE — ED Notes (Signed)
Pt stated around 1730 during dinner, he had an acute onset of right sided weakness along with slurred speech. Patient's wife called EMS. Upon EMS arrival, patient's symptoms were resolving. Patient arrived to the ER with right arm tingling, clear speech, and stable vital signs.

## 2016-02-05 NOTE — H&P (Addendum)
[facility] History and Physical  Christian Weeks [id number] DOB: Dec 19, 1951 DOA: 02/05/2016  Referring physician: ED physician PCP: Horatio Pel, MD  Specialists:   Chief Complaint: Right-sided weakness, slurred speech, left arm numbness, left calf pain  HPI: Christian Weeks is a 65 y.o. male with PMH of hypertension, hypothyroidism, arthritis, recent left knee replacement, who presents with right-sided weakness, slurred speech, left arm numbness and left calf pain.  Patient reports that he underwent a successful left knee replacement on 01/14/69. He has been recovering well. He started having mild left arm tingling and numbness, which has been going on for about 1 week. Today at about 1730, he started having right-sided weakness, slurred speech, which has gradually resolved. Currently no weakness in the right side. He seems to have mild difficulty speaking when interviewed patient. No vision changes, hearing loss, chest pain, shortness breath, cough, abdominal pain, diarrhea, symptoms of UTI. No fever or chills. Patient also reports having mild left calf tenderness.  In ED, patient was found to have negative CT head for acute abnormalities, INR 1.19, CBC 7.6, temperature normal, no tachycardia, electrolytes and renal function okay. Patient admitted to inpatient for further management treatment. Neurology was consulted.  EKG: Independently reviewed. QTC 446, LAD, no ischemic change.  Where does patient live?   At home    Can patient participate in ADLs?  Some   Review of Systems:   General: no fevers, chills, no changes in body weight, has fatigue HEENT: no blurry vision, hearing changes or sore throat Pulm: no dyspnea, coughing, wheezing CV: no chest pain, palpitations Abd: no nausea, vomiting, abdominal pain, diarrhea, constipation GU: no dysuria, burning on urination, increased urinary frequency, hematuria  Ext: has tenderness over left calf area. Neuro: R sided  weakness, and left arm numbness. no vision change or hearing loss Skin: no rash MSK: No muscle spasm, no deformity, no limitation of range of movement in spin Heme: No easy bruising.  Travel history: No recent long distant travel.  Allergy: No Known Allergies  Past Medical History  Diagnosis Date  . Hypertension   . Hypothyroidism   . Arthritis     Past Surgical History  Procedure Laterality Date  . Knee arthroplasty Left 01/15/2016    Procedure: COMPUTER ASSISTED TOTAL KNEE ARTHROPLASTY;  Surgeon: [name], MD;  Location: [facility];  Service: Orthopedics;  Laterality: Left;    Social History:  reports that he has never smoked. He has never used smokeless tobacco. He reports that he does not drink alcohol or use illicit drugs.  Family History:  Family History  Problem Relation Age of Onset  . Congestive Heart Failure Mother   . Diabetes Mother   . Thyroid disease Sister      Prior to Admission medications   Medication Sig Start Date End Date Taking? Authorizing Provider  acetaminophen (TYLENOL) 500 MG tablet Take 1,000 mg by mouth every 6 (six) hours as needed for moderate pain.   Yes Historical Provider, MD  levothyroxine (SYNTHROID, LEVOTHROID) 137 MCG tablet Take 137 mcg by mouth daily before breakfast.   Yes Historical Provider, MD  losartan (COZAAR) 100 MG tablet Take 50 mg by mouth daily.   Yes Historical Provider, MD  aspirin EC 325 MG EC tablet Take 1 tablet (325 mg total) by mouth daily with breakfast. Patient not taking: Reported on 02/01/2016 01/17/16   Newt Minion, MD  oxyCODONE-acetaminophen (ROXICET) 5-325 MG tablet Take 1 tablet by mouth every 4 (four) hours as needed  for severe pain. Patient not taking: Reported on 02/01/2016 01/17/16   Newt Minion, MD    Physical Exam: Filed Vitals:   02/05/16 1934 02/05/16 1940 02/05/16 2002 02/05/16 2155  BP:  143/103  129/93  Pulse:  73  90  Temp:  98.3 F (36.8 C) 98.3 F (36.8 C) 98.2 F (36.8 C)  TempSrc: Oral  Oral  Oral  Resp:    18  SpO2:  96%  98%   General: Not in acute distress HEENT:       Eyes: PERRL, EOMI, no scleral icterus.       ENT: No discharge from the ears and nose, no pharynx injection, no tonsillar enlargement.        Neck: No JVD, no bruit, no mass felt. Heme: No neck lymph node enlargement. Cardiac: S1/S2, RRR, No murmurs, No gallops or rubs. Pulm: No rales, wheezing, rhonchi or rubs. Abd: Soft, nondistended, nontender, no rebound pain, no organomegaly, BS present. Ext:  2+DP/PT pulse bilaterally. Left calf tenderness. Musculoskeletal: No joint deformities, No joint redness or warmth, no limitation of ROM in spin. Skin: No rashes.  Neuro: Alert, oriented X3, cranial nerves II-XII grossly intact, muscle strength 5/5 in all extremities, sensation to light touch intact. Knee reflex 1+ bilaterally. Negative Babinski's sign. Normal finger to nose test. Psych: Patient is not psychotic, no suicidal or hemocidal ideation.  Labs on Admission:  Basic Metabolic Panel:  Recent Labs Lab 02/05/16 1915 02/05/16 1930  NA 139 140  K 4.1 4.0  CL 103 103  CO2 25  --   GLUCOSE 120* 114*  BUN 12 14  CREATININE 1.21 1.10  CALCIUM 9.2  --    Liver Function Tests:  Recent Labs Lab 02/05/16 1915  AST 27  ALT 16*  ALKPHOS 94  BILITOT 0.5  PROT 6.7  ALBUMIN 3.5   No results for input(s): LIPASE, AMYLASE in the last 168 hours. No results for input(s): AMMONIA in the last 168 hours. CBC:  Recent Labs Lab 02/05/16 1915 02/05/16 1930  WBC 7.6  --   NEUTROABS 2.9  --   HGB 13.2 14.6  HCT 39.6 43.0  MCV 89.8  --   PLT 289  --    Cardiac Enzymes: No results for input(s): CKTOTAL, CKMB, CKMBINDEX, TROPONINI in the last 168 hours.  BNP (last 3 results) No results for input(s): BNP in the last 8760 hours.  ProBNP (last 3 results) No results for input(s): PROBNP in the last 8760 hours.  CBG:  Recent Labs Lab 02/05/16 1939  GLUCAP 109*    Radiological Exams on  Admission: Ct Head Wo Contrast  02/05/2016  ADDENDUM REPORT: 02/05/2016 19:49 ADDENDUM: These results were called by telephone at the time of interpretation on 02/05/2016 at 7:40 pm to nurse Gilda Crease, who verbally acknowledged these results. Electronically Signed   By: Anner Crete M.D.   On: 02/05/2016 19:49  02/05/2016  CLINICAL DATA:  65 year old male with right arm weakness and slurred speech. Code stroke. EXAM: CT HEAD WITHOUT CONTRAST TECHNIQUE: Contiguous axial images were obtained from the base of the skull through the vertex without intravenous contrast. COMPARISON:  None. FINDINGS: There is mild prominence of the ventricles and sulci compatible with age-related atrophy. Mild periventricular and deep white matter chronic microvascular ischemic changes noted. There is no acute intracranial hemorrhage. No mass effect or midline shift noted. There is mild mucoperiosteal thickening of the paranasal sinuses with partial opacification of the ethmoid air cells. The mastoid air cells are  well aerated. IMPRESSION: No acute intracranial hemorrhage. Mild age-related atrophy and chronic microvascular ischemic disease. If symptoms persist and there are no contraindications, MRI may provide better evaluation if clinically indicated Electronically Signed: By: Anner Crete M.D. On: 02/05/2016 19:41   Mr [name] Head Wo Contrast  02/05/2016  CLINICAL DATA:  Initial evaluation for acute right-sided weakness. EXAM: MRI HEAD WITHOUT CONTRAST MRA HEAD WITHOUT CONTRAST TECHNIQUE: Multiplanar, multiecho pulse sequences of the brain and surrounding structures were obtained without intravenous contrast. Angiographic images of the head were obtained using MRA technique without contrast. COMPARISON:  Prior CT from earlier the same day. FINDINGS: MRI HEAD FINDINGS Diffuse prominence of the CSF containing spaces is compatible with moderate age-related cerebral atrophy. Patchy T2/FLAIR hyperintensity within the periventricular  and deep white matter both cerebral hemispheres most consistent with chronic small vessel ischemic disease, fairly mild nature. Chronic small vessel type changes present within the pons. Probable small remote lacunar infarcts within the central pons. There is patchy and linear areas of restricted diffusion involving the mid/posterior left frontal lobe (series 5, image 16). This involves the cortical gray matter and underlying subcortical/ deep white matter. An additional area of patchy restricted diffusion involves the cortical gray matter of the left occipital lobe (series 4, image 23). No other areas of acute infarction. No associated mass effect or hemorrhage. Major intracranial vascular flow voids are maintained. No mass lesion, midline shift, or mass effect. No hydrocephalus. No extra-axial fluid collection. Craniocervical junction within normal limits. Pituitary gland unremarkable. No acute abnormality about the orbits. Mild mucosal thickening throughout the paranasal sinuses with scattered opacity within the left ethmoidal air cells. No significant mastoid effusion. Inner ear structures grossly normal. Bone marrow signal intensity within normal limits. No scalp soft tissue abnormality. MRA HEAD FINDINGS ANTERIOR CIRCULATION: Visualized distal cervical segments of the internal carotid arteries are mildly tortuous but widely patent with antegrade flow. Petrous, cavernous, supraclinoid segments are widely patent. A1 segments, anterior communicating artery, and anterior cerebral arteries well opacified. M1 segments widely patent without stenosis or occlusion. MCA bifurcations within normal limits. Distal MCA branches fairly symmetric and well opacified. POSTERIOR CIRCULATION: Vertebral arteries patent to the vertebrobasilar junction. Right posterior inferior cerebral artery patent. Left posterior inferior cerebral artery not visualized. Basilar artery mildly tortuous but widely patent to its distal aspect.  Superior cerebellar arteries well opacified bilaterally. Both of the posterior cerebral arteries arise knee basilar artery and are well opacified to their distal aspects. A small left posterior communicating artery noted. No aneurysm or vascular malformation. IMPRESSION: MRI HEAD IMPRESSION: 1. 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 No associated mass-effect. 2. Moderate generalized cerebral atrophy with mild chronic small vessel ischemic disease. MRA HEAD IMPRESSION: Negative intracranial MRA. No large or proximal arterial branch occlusion. No high-grade or correctable stenosis. Electronically Signed   By: Jeannine Boga M.D.   On: 02/05/2016 22:40   Mr Brain Wo Contrast  02/05/2016  CLINICAL DATA:  Initial evaluation for acute right-sided weakness. EXAM: MRI HEAD WITHOUT CONTRAST MRA HEAD WITHOUT CONTRAST TECHNIQUE: Multiplanar, multiecho pulse sequences of the brain and surrounding structures were obtained without intravenous contrast. Angiographic images of the head were obtained using MRA technique without contrast. COMPARISON:  Prior CT from earlier the same day. FINDINGS: MRI HEAD FINDINGS Diffuse prominence of the CSF containing spaces is compatible with moderate age-related cerebral atrophy. Patchy T2/FLAIR hyperintensity within the periventricular and deep white matter both cerebral hemispheres most consistent  with chronic small vessel ischemic disease, fairly mild nature. Chronic small vessel type changes present within the pons. Probable small remote lacunar infarcts within the central pons. There is patchy and linear areas of restricted diffusion involving the mid/posterior left frontal lobe (series 5, image 16). This involves the cortical gray matter and underlying subcortical/ deep white matter. An additional area of patchy restricted diffusion involves the cortical gray matter of the left occipital lobe (series 4,  image 23). No other areas of acute infarction. No associated mass effect or hemorrhage. Major intracranial vascular flow voids are maintained. No mass lesion, midline shift, or mass effect. No hydrocephalus. No extra-axial fluid collection. Craniocervical junction within normal limits. Pituitary gland unremarkable. No acute abnormality about the orbits. Mild mucosal thickening throughout the paranasal sinuses with scattered opacity within the left ethmoidal air cells. No significant mastoid effusion. Inner ear structures grossly normal. Bone marrow signal intensity within normal limits. No scalp soft tissue abnormality. MRA HEAD FINDINGS ANTERIOR CIRCULATION: Visualized distal cervical segments of the internal carotid arteries are mildly tortuous but widely patent with antegrade flow. Petrous, cavernous, supraclinoid segments are widely patent. A1 segments, anterior communicating artery, and anterior cerebral arteries well opacified. M1 segments widely patent without stenosis or occlusion. MCA bifurcations within normal limits. Distal MCA branches fairly symmetric and well opacified. POSTERIOR CIRCULATION: Vertebral arteries patent to the vertebrobasilar junction. Right posterior inferior cerebral artery patent. Left posterior inferior cerebral artery not visualized. Basilar artery mildly tortuous but widely patent to its distal aspect. Superior cerebellar arteries well opacified bilaterally. Both of the posterior cerebral arteries arise knee basilar artery and are well opacified to their distal aspects. A small left posterior communicating artery noted. No aneurysm or vascular malformation. IMPRESSION: MRI HEAD IMPRESSION: 1. 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 No associated mass-effect. 2. Moderate generalized cerebral atrophy with mild chronic small vessel ischemic disease. MRA HEAD IMPRESSION: Negative intracranial MRA. No large or  proximal arterial branch occlusion. No high-grade or correctable stenosis. Electronically Signed   By: Jeannine Boga M.D.   On: 02/05/2016 22:40    Assessment/Plan Principal Problem:   Stroke [facility]) Active Problems:   Status post total left knee replacement   TIA (transient ischemic attack)   Hypothyroidism   Arthritis   Calf pain  Stroke: Patient transient right-sided weakness and slurred speech are concerning for TIA vs stroke. He has left calf tenderness after recent knee surgery, indicating possible DVT, need to r/o PFO. Neurology was consulted.  - will admit to tele bed for observation - f/u neuro recommendations - Risk factor modification: HgbA1c, fasting lipid panel  - MRI, MRA of the brain without contrast  - PT consult, OT consult, Speech consult  - 2 d Echocardiogram (with bubble study, ordered by neuro) - Ekg  - Carotid dopplers  - Aspirin   Addendum: MRI of brains showed small left MCA embolic infarct, likely due to cardiac embolic in etiology per neurology.  -Neurologist ordered normal saline at 100 mL an hour, to allow permissive hypertension.   Status post total left knee replacement: healing well. -prn percocet and tylenol for pain -pt/ot  Hypothyroidism: Last TSH was not on record -Continue home Synthroid -Check TSH  Left Calf pain: -LE venous doppler to    DVT ppx:  SQ Lovenox  Code Status: Full code Family Communication: Yes, patient's son and wife  at bed side Disposition Plan: Admit to inpatient   Date of Service 02/05/2016  Ivor Costa [facility] Pager [phone]  If 7PM-7AM, please contact night-coverage [email or web address] Password [email or web address] 02/05/2016, 11:19 PM

## 2016-02-06 ENCOUNTER — Observation Stay (HOSPITAL_COMMUNITY): Payer: PPO

## 2016-02-06 DIAGNOSIS — I639 Cerebral infarction, unspecified: Secondary | ICD-10-CM | POA: Diagnosis present

## 2016-02-06 DIAGNOSIS — R29700 NIHSS score 0: Secondary | ICD-10-CM | POA: Diagnosis present

## 2016-02-06 DIAGNOSIS — E039 Hypothyroidism, unspecified: Secondary | ICD-10-CM | POA: Diagnosis present

## 2016-02-06 DIAGNOSIS — E032 Hypothyroidism due to medicaments and other exogenous substances: Secondary | ICD-10-CM

## 2016-02-06 DIAGNOSIS — R4781 Slurred speech: Secondary | ICD-10-CM | POA: Diagnosis present

## 2016-02-06 DIAGNOSIS — G8191 Hemiplegia, unspecified affecting right dominant side: Secondary | ICD-10-CM | POA: Diagnosis present

## 2016-02-06 DIAGNOSIS — M79662 Pain in left lower leg: Secondary | ICD-10-CM

## 2016-02-06 DIAGNOSIS — Q211 Atrial septal defect: Secondary | ICD-10-CM | POA: Diagnosis not present

## 2016-02-06 DIAGNOSIS — Z7982 Long term (current) use of aspirin: Secondary | ICD-10-CM | POA: Diagnosis not present

## 2016-02-06 DIAGNOSIS — E781 Pure hyperglyceridemia: Secondary | ICD-10-CM | POA: Diagnosis present

## 2016-02-06 DIAGNOSIS — G459 Transient cerebral ischemic attack, unspecified: Secondary | ICD-10-CM | POA: Diagnosis present

## 2016-02-06 DIAGNOSIS — I10 Essential (primary) hypertension: Secondary | ICD-10-CM | POA: Diagnosis present

## 2016-02-06 DIAGNOSIS — I82402 Acute embolism and thrombosis of unspecified deep veins of left lower extremity: Secondary | ICD-10-CM | POA: Diagnosis present

## 2016-02-06 DIAGNOSIS — E038 Other specified hypothyroidism: Secondary | ICD-10-CM | POA: Diagnosis not present

## 2016-02-06 DIAGNOSIS — I6789 Other cerebrovascular disease: Secondary | ICD-10-CM | POA: Diagnosis not present

## 2016-02-06 DIAGNOSIS — Z96652 Presence of left artificial knee joint: Secondary | ICD-10-CM | POA: Diagnosis present

## 2016-02-06 DIAGNOSIS — R29810 Facial weakness: Secondary | ICD-10-CM | POA: Diagnosis present

## 2016-02-06 DIAGNOSIS — I824Z2 Acute embolism and thrombosis of unspecified deep veins of left distal lower extremity: Secondary | ICD-10-CM | POA: Diagnosis not present

## 2016-02-06 DIAGNOSIS — I63412 Cerebral infarction due to embolism of left middle cerebral artery: Secondary | ICD-10-CM | POA: Diagnosis present

## 2016-02-06 LAB — HIV ANTIBODY (ROUTINE TESTING W REFLEX): HIV Screen 4th Generation wRfx: NONREACTIVE

## 2016-02-06 LAB — RAPID URINE DRUG SCREEN, HOSP PERFORMED
Amphetamines: NOT DETECTED
BENZODIAZEPINES: NOT DETECTED
Barbiturates: NOT DETECTED
Cocaine: NOT DETECTED
OPIATES: NOT DETECTED
Tetrahydrocannabinol: NOT DETECTED

## 2016-02-06 LAB — FOLATE: FOLATE: 7.6 ng/mL (ref >5.9)

## 2016-02-06 LAB — TSH: TSH: 5.938 u[IU]/mL — AB (ref 0.350–4.500)

## 2016-02-06 LAB — VITAMIN B12: VITAMIN B 12: 231 pg/mL (ref 180–914)

## 2016-02-06 MED ORDER — APIXABAN 5 MG PO TABS
10.0000 mg | ORAL_TABLET | Freq: Two times a day (BID) | ORAL | Status: DC
Start: 2016-02-06 — End: 2016-02-08
  Administered 2016-02-06 – 2016-02-08 (×5): 10 mg via ORAL
  Filled 2016-02-06 (×5): qty 2

## 2016-02-06 MED ORDER — APIXABAN 5 MG PO TABS: 5.0000 mg | ORAL_TABLET | Freq: Two times a day (BID) | ORAL | Status: DC

## 2016-02-06 MED ORDER — ROSUVASTATIN CALCIUM 10 MG PO TABS
10.0000 mg | ORAL_TABLET | Freq: Every day | ORAL | Status: DC
  Administered 2016-02-06 – 2016-02-08 (×3): 10 mg via ORAL
  Filled 2016-02-06 (×3): qty 1

## 2016-02-06 NOTE — Progress Notes (Signed)
PT Cancellation Note  Patient Details Name: Christian Weeks MRN: [id number] DOB: [birth year]   Cancelled Treatment:    Reason Eval/Treat Not Completed: Other (comment) Patient on bedrest. Will attempt eval later today if activity orders are increased.   Jaymie Misch 02/06/2016, 8:08 AM  Pager [phone]

## 2016-02-06 NOTE — Progress Notes (Signed)
VASCULAR LAB PRELIMINARY  PRELIMINARY  PRELIMINARY  PRELIMINARY  Bilateral venous duplex completed.    Preliminary report:  There is acute DVT noted in one of the posterior tibial veins of the left lower extremity.  All other veins appear thrombus free.  However, there is sluggish flow noted in the right common femoral and femoral veins.   Yee Joss, RVT 02/06/2016, 11:38 AM

## 2016-02-06 NOTE — Progress Notes (Signed)
OT Discharge Note  Patient Details Name: GAMBIT GOLIA MRN: [id number] DOB: 1951/08/24   Cancelled Treatment:    Reason Eval/Treat Not Completed: OT screened, no needs identified, will sign off. Spoke directly to PT [place] and no acute needs at this time  Vonita Moss   OTR/L Pager: [phone] Office: [phone] .  02/06/2016, 11:11 AM

## 2016-02-06 NOTE — Progress Notes (Signed)
Patient Demographics:    Christian Weeks, is a 65 y.o. male, DOB - [birth year], [id number]  Admit date - 02/05/2016   Admitting Physician Ivor Costa, MD  Outpatient Primary MD for the patient is Horatio Pel, MD  LOS - 1   Chief Complaint  Patient presents with  . Transient Ischemic Attack        Subjective:    Christian Weeks today has, No headache, No chest pain, No abdominal pain - No Nausea, No new weakness tingling or numbness, No Cough - SOB.     Assessment  & Plan :     1. 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 Will undergo carotid duplex and TTE with bubble study, recent right leg surgery with right leg swelling will rule out DVT. If PFO is not detected he might require a TEE. We'll request cardiology to evaluate as well. Will be seen by PT-OT and speech as well.  2. Recent left knee surgery. On by Dr. Lorin Mercy. Stable. Does have right leg swelling, check venous duplex to rule out DVT.  3. High triglycerides in the setting of stroke. Add low-dose statin.  4. Hypothyroidism. Continue home dose Synthroid. TSH stable at 5.9.    Code Status : Full  Family Communication  : None present  Disposition Plan  : Home once neurology workup was done  Consults  : Neurology, cardiology  Procedures  :  CT head and MRI brain confirming left occipital and frontal lobe ischemic infarct.  Carotid ultrasound  Bubble TTE  Lower extremity venous duplex  DVT Prophylaxis  :  Lovenox    Lab Results  Component Value Date   PLT 289 02/05/2016    Inpatient Medications  Scheduled Meds: . aspirin  300 mg Rectal Daily   Or   . aspirin  325 mg Oral Daily  . enoxaparin (LOVENOX) injection  40 mg Subcutaneous QHS  . levothyroxine  137 mcg Oral QAC breakfast   Continuous Infusions: . sodium chloride 100 mL/hr at 02/05/16 2232   PRN Meds:.oxyCODONE-acetaminophen, senna-docusate  Antibiotics  :     Anti-infectives    None        Objective:   Filed Vitals:   02/06/16 0200 02/06/16 0424 02/06/16 0633 02/06/16 0818  BP: 144/91 143/88 134/87 122/81  Pulse: 61 66 63 66  Temp: 97.8 F (36.6 C) 98.4 F (36.9 C) 98.2 F (36.8 C) 98.2 F (36.8 C)  TempSrc: Oral Oral Oral Oral  Resp: 18 19 18 20   SpO2: 97% 97% 98% 96%    Wt Readings from Last 3 Encounters:  01/15/16 120.203 kg (265 lb)  01/06/16 55.611 kg (122 lb 9.6 oz)     Intake/Output Summary (Last 24 hours) at 02/06/16 1057 Last data filed at 02/06/16 0819  Gross per 24 hour  Intake    120 ml  Output    300 ml  Net   -180 ml     Physical Exam  Awake Alert, Oriented X 3, No new F.N deficits, Normal affect [place].AT,PERRAL, mild l facial droop Supple Neck,No JVD, No cervical lymphadenopathy  appriciated.  Symmetrical Chest wall movement, Good air movement bilaterally, CTAB RRR,No Gallops,Rubs or new Murmurs, No Parasternal Heave +ve B.Sounds, Abd Soft, No tenderness, No organomegaly appriciated, No rebound - guarding or rigidity. No Cyanosis, Clubbing or edema, No new Rash or bruise, left knee postop scar stable right leg swollen       Data Review:   Micro Results No results found for this or any previous visit (from the past 240 hour(s)).  Radiology Reports Ct Head Wo Contrast  02/05/2016  ADDENDUM REPORT: 02/05/2016 19:49 ADDENDUM: These results were called by telephone at the time of interpretation on 02/05/2016 at 7:40 pm to nurse Gilda Crease, who verbally acknowledged these results. Electronically Signed   By: Anner Crete M.D.   On: 02/05/2016 19:49  02/05/2016  CLINICAL DATA:  65 year old male with right arm weakness and slurred  speech. Code stroke. EXAM: CT HEAD WITHOUT CONTRAST TECHNIQUE: Contiguous axial images were obtained from the base of the skull through the vertex without intravenous contrast. COMPARISON:  None. FINDINGS: There is mild prominence of the ventricles and sulci compatible with age-related atrophy. Mild periventricular and deep white matter chronic microvascular ischemic changes noted. There is no acute intracranial hemorrhage. No mass effect or midline shift noted. There is mild mucoperiosteal thickening of the paranasal sinuses with partial opacification of the ethmoid air cells. The mastoid air cells are well aerated. IMPRESSION: No acute intracranial hemorrhage. Mild age-related atrophy and chronic microvascular ischemic disease. If symptoms persist and there are no contraindications, MRI may provide better evaluation if clinically indicated Electronically Signed: By: Anner Crete M.D. On: 02/05/2016 19:41   Mr [name] Head Wo Contrast  02/05/2016  CLINICAL DATA:  Initial evaluation for acute right-sided weakness. EXAM: MRI HEAD WITHOUT CONTRAST MRA HEAD WITHOUT CONTRAST TECHNIQUE: Multiplanar, multiecho pulse sequences of the brain and surrounding structures were obtained without intravenous contrast. Angiographic images of the head were obtained using MRA technique without contrast. COMPARISON:  Prior CT from earlier the same day. FINDINGS: MRI HEAD FINDINGS Diffuse prominence of the CSF containing spaces is compatible with moderate age-related cerebral atrophy. Patchy T2/FLAIR hyperintensity within the periventricular and deep white matter both cerebral hemispheres most consistent with chronic small vessel ischemic disease, fairly mild nature. Chronic small vessel type changes present within the pons. Probable small remote lacunar infarcts within the central pons. There is patchy and linear areas of restricted diffusion involving the mid/posterior left frontal lobe (series 5, image 16). This involves the  cortical gray matter and underlying subcortical/ deep white matter. An additional area of patchy restricted diffusion involves the cortical gray matter of the left occipital lobe (series 4, image 23). No other areas of acute infarction. No associated mass effect or hemorrhage. Major intracranial vascular flow voids are maintained. No mass lesion, midline shift, or mass effect. No hydrocephalus. No extra-axial fluid collection. Craniocervical junction within normal limits. Pituitary gland unremarkable. No acute abnormality about the orbits. Mild mucosal thickening throughout the paranasal sinuses with scattered opacity within the left ethmoidal air cells. No significant mastoid effusion. Inner ear structures grossly normal. Bone marrow signal intensity within normal limits. No scalp soft tissue abnormality. MRA HEAD FINDINGS ANTERIOR CIRCULATION: Visualized distal cervical segments of the internal carotid arteries are mildly tortuous but widely patent with antegrade flow. Petrous, cavernous, supraclinoid segments are widely patent. A1 segments, anterior communicating artery, and anterior cerebral arteries well opacified. M1 segments widely patent without stenosis or occlusion. MCA bifurcations within normal limits. Distal MCA branches fairly symmetric and well opacified.  POSTERIOR CIRCULATION: Vertebral arteries patent to the vertebrobasilar junction. Right posterior inferior cerebral artery patent. Left posterior inferior cerebral artery not visualized. Basilar artery mildly tortuous but widely patent to its distal aspect. Superior cerebellar arteries well opacified bilaterally. Both of the posterior cerebral arteries arise knee basilar artery and are well opacified to their distal aspects. A small left posterior communicating artery noted. No aneurysm or vascular malformation. IMPRESSION: MRI HEAD IMPRESSION: 1. Two separate acute ischemic nonhemorrhagic infarcts involving the left frontal and left occipital lobes  as above. No associated mass-effect. 2. Moderate generalized cerebral atrophy with mild chronic small vessel ischemic disease. MRA HEAD IMPRESSION: Negative intracranial MRA. No large or proximal arterial branch occlusion. No high-grade or correctable stenosis. Electronically Signed   By: Jeannine Boga M.D.   On: 02/05/2016 22:40   Mr Brain Wo Contrast  02/05/2016  CLINICAL DATA:  Initial evaluation for acute right-sided weakness. EXAM: MRI HEAD WITHOUT CONTRAST MRA HEAD WITHOUT CONTRAST TECHNIQUE: Multiplanar, multiecho pulse sequences of the brain and surrounding structures were obtained without intravenous contrast. Angiographic images of the head were obtained using MRA technique without contrast. COMPARISON:  Prior CT from earlier the same day. FINDINGS: MRI HEAD FINDINGS Diffuse prominence of the CSF containing spaces is compatible with moderate age-related cerebral atrophy. Patchy T2/FLAIR hyperintensity within the periventricular and deep white matter both cerebral hemispheres most consistent with chronic small vessel ischemic disease, fairly mild nature. Chronic small vessel type changes present within the pons. Probable small remote lacunar infarcts within the central pons. There is patchy and linear areas of restricted diffusion involving the mid/posterior left frontal lobe (series 5, image 16). This involves the cortical gray matter and underlying subcortical/ deep white matter. An additional area of patchy restricted diffusion involves the cortical gray matter of the left occipital lobe (series 4, image 23). No other areas of acute infarction. No associated mass effect or hemorrhage. Major intracranial vascular flow voids are maintained. No mass lesion, midline shift, or mass effect. No hydrocephalus. No extra-axial fluid collection. Craniocervical junction within normal limits. Pituitary gland unremarkable. No acute abnormality about the orbits. Mild mucosal thickening throughout the  paranasal sinuses with scattered opacity within the left ethmoidal air cells. No significant mastoid effusion. Inner ear structures grossly normal. Bone marrow signal intensity within normal limits. No scalp soft tissue abnormality. MRA HEAD FINDINGS ANTERIOR CIRCULATION: Visualized distal cervical segments of the internal carotid arteries are mildly tortuous but widely patent with antegrade flow. Petrous, cavernous, supraclinoid segments are widely patent. A1 segments, anterior communicating artery, and anterior cerebral arteries well opacified. M1 segments widely patent without stenosis or occlusion. MCA bifurcations within normal limits. Distal MCA branches fairly symmetric and well opacified. POSTERIOR CIRCULATION: Vertebral arteries patent to the vertebrobasilar junction. Right posterior inferior cerebral artery patent. Left posterior inferior cerebral artery not visualized. Basilar artery mildly tortuous but widely patent to its distal aspect. Superior cerebellar arteries well opacified bilaterally. Both of the posterior cerebral arteries arise knee basilar artery and are well opacified to their distal aspects. A small left posterior communicating artery noted. No aneurysm or vascular malformation. IMPRESSION: MRI HEAD IMPRESSION: 1. 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 No associated mass-effect. 2. Moderate generalized cerebral atrophy with mild chronic small vessel ischemic disease. MRA HEAD IMPRESSION: Negative intracranial MRA. No large or proximal arterial branch occlusion. No high-grade or correctable stenosis. Electronically Signed   By: Jeannine Boga M.D.   On: 02/05/2016 22:40  CBC  Recent Labs Lab 02/05/16 1915 02/05/16 1930  WBC 7.6  --   HGB 13.2 14.6  HCT 39.6 43.0  PLT 289  --   MCV 89.8  --   MCH 29.9  --   MCHC 33.3  --   RDW 14.3  --   LYMPHSABS 3.4  --   MONOABS 0.7  --   EOSABS 0.6  --     BASOSABS 0.1  --     Chemistries   Recent Labs Lab 02/05/16 1915 02/05/16 1930  NA 139 140  K 4.1 4.0  CL 103 103  CO2 25  --   GLUCOSE 120* 114*  BUN 12 14  CREATININE 1.21 1.10  CALCIUM 9.2  --   AST 27  --   ALT 16*  --   ALKPHOS 94  --   BILITOT 0.5  --    ------------------------------------------------------------------------------------------------------------------  Recent Labs  02/05/16 1950  CHOL 141  HDL 26*  LDLCALC 55  TRIG 300*  CHOLHDL 5.4    No results found for: HGBA1C ------------------------------------------------------------------------------------------------------------------  Recent Labs  02/06/16 0515  TSH 5.938*   ------------------------------------------------------------------------------------------------------------------  Recent Labs  02/06/16 0515  VITAMINB12 231  FOLATE 7.6    Coagulation profile  Recent Labs Lab 02/05/16 1915  INR 1.19    No results for input(s): DDIMER in the last 72 hours.  Cardiac Enzymes No results for input(s): CKMB, TROPONINI, MYOGLOBIN in the last 168 hours.  Invalid input(s): CK ------------------------------------------------------------------------------------------------------------------ No results found for: BNP  Time Spent in minutes  35   SINGH,PRASHANT K M.D on 02/06/2016 at 10:57 AM  Between 7am to 7pm - Pager - [phone]  After 7pm go to [email or web address] - password [facility]  [facility] -  Office  [phone]

## 2016-02-06 NOTE — Progress Notes (Signed)
OT NOTE  OT order received and appreciated however this conflicts with current bedrest order set. Please increase activity tolerance as appropriate and remove bedrest from orders. . Please contact OT at [phone] if bed rest order is discontinued. OT will hold evaluation at this time and will check back as time allows pending increased activity orders.   Jeri Modena   OTR/L Pager: [phone] Office: [phone] .

## 2016-02-06 NOTE — Evaluation (Signed)
Speech Language Pathology Evaluation Patient Details Name: Christian Weeks MRN: [id number] DOB: June 26, 1951 Today's Date: 02/06/2016 Time: [id number] SLP Time Calculation (min) (ACUTE ONLY): 17 min  Problem List:  Patient Active Problem List   Diagnosis Date Noted  . TIA (transient ischemic attack) 02/05/2016  . Stroke ([place]) 02/05/2016  . Hypothyroidism   . Arthritis   . Calf pain   . 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 Hypertension   . Hypothyroidism   . Arthritis    Past Surgical History:  Past Surgical History  Procedure Laterality Date  . Knee arthroplasty Left 01/15/2016    Procedure: COMPUTER ASSISTED TOTAL KNEE ARTHROPLASTY;  Surgeon: [name], MD;  Location: [place];  Service: Orthopedics;  Laterality: Left;   HPI:  65 y.o. male with PMH of hypertension, hypothyroidism, arthritis, recent left knee replacement 01/15/16, who presents with right-sided weakness, slurred speech, left arm numbness and left calf pain.  MRI infarcts left frontal and left occipital lobes.     Assessment / Plan / Recommendation Clinical Impression  Pt c/o persisting word-finding deficts.  Portions of [place] Diagnostic Aphasia Exam administered, which revealed normal fluency, expressive and receptive language, reading comprehension, repetition, and occasional presence of paraphasias during running speech, which pt was able to recognize and self correct.  No SLP needs are identified.  Pt agrees.  Will sign off.     SLP Assessment  Patient does not need any further Speech Lanaguage Pathology Services    Follow Up Recommendations  None    Frequency and Duration           SLP Evaluation Prior Functioning  Cognitive/Linguistic Baseline: Within functional limits Type of Home: House  Lives With: Spouse Available Help at Discharge: Family;Available 24 hours/day   Cognition  Overall Cognitive Status: Within  Functional Limits for tasks assessed Arousal/Alertness: Awake/alert Orientation Level: Oriented X4 Attention: Alternating Alternating Attention: Appears intact Memory: Appears intact    Comprehension  Auditory Comprehension Overall Auditory Comprehension: Appears within functional limits for tasks assessed Yes/No Questions: Within Functional Limits Commands: Within Functional Limits Conversation: Complex Visual Recognition/Discrimination Discrimination: Within Function Limits Reading Comprehension Reading Status: Within funtional limits    Expression Expression Primary Mode of Expression: Verbal Verbal Expression Overall Verbal Expression: Appears within functional limits for tasks assessed Naming: No impairment Written Expression Dominant Hand: Left Written Expression: Not tested   Oral / Motor  Oral Motor/Sensory Function Overall Oral Motor/Sensory Function: Within functional limits Motor Speech Overall Motor Speech: Appears within functional limits for tasks assessed   GO          Functional Assessment Tool Used: BDAE Functional Limitations: Spoken language expressive Spoken Language Expression Current Status [id number]): At least 1 percent but less than 20 percent impaired, limited or restricted Spoken Language Expression Goal Status [phone]): At least 1 percent but less than 20 percent impaired, limited or restricted Spoken Language Expression Discharge Status [phone]): At least 1 percent but less than 20 percent impaired, limited or restricted         Juan Quam Laurice 02/06/2016, 3:21 PM  Emmalee Solivan L. Tivis Ringer, [place] CCC/SLP Pager [phone]

## 2016-02-06 NOTE — Evaluation (Addendum)
Physical Therapy Evaluation and Discharge Patient Details Name: Christian Weeks MRN: [id number] DOB: 1951-09-10 Today's Date: 02/06/2016   History of Present Illness  s/p episode of Rt UE>LE weakness and decr speech; MRI +infarcts Lt frontal and occipital lobes; symptoms resolved and no tPA given PMHx- Lt TKR 01/15/16, HTN, OA    Clinical Impression  Patient evaluated by Physical Therapy with no further acute PT needs identified. All education has been completed and the patient has no further questions.  See below for any follow-up Physical Therapy or equipment needs. PT is signing off. Thank you for this referral.     Follow Up Recommendations Outpatient PT (resume OPPT for Lt TKR)    Equipment Recommendations  None recommended by PT    Recommendations for Other Services       Precautions / Restrictions Precautions Precautions: Knee Precaution Comments: pt knows these precautions Restrictions Weight Bearing Restrictions: No      Mobility  Bed Mobility Overal bed mobility: Independent                Transfers Overall transfer level: Independent                  Ambulation/Gait Ambulation/Gait assistance: Supervision Ambulation Distance (Feet): 150 Feet Assistive device: None Gait Pattern/deviations: Step-through pattern;Decreased stride length;Antalgic;Drifts right/left   Gait velocity interpretation: Below normal speed for age/gender General Gait Details: no cane present; Lt knee without full extension causing "limp"  Stairs            Wheelchair Mobility    Modified Rankin (Stroke Patients Only) Modified Rankin (Stroke Patients Only) Pre-Morbid Rankin Score: No symptoms Modified Rankin: No significant disability (speech changes)     Balance Overall balance assessment: Needs assistance         Standing balance support: No upper extremity supported Standing balance-Leahy Scale: Good                   Standardized Balance  Assessment Standardized Balance Assessment : Dynamic Gait Index (limitations due to recent Lt TKR)   Dynamic Gait Index Level Surface: Mild Impairment Change in Gait Speed: Moderate Impairment Gait with Horizontal Head Turns: Moderate Impairment Gait and Pivot Turn: Mild Impairment       Pertinent Vitals/Pain Pain Assessment: 0-10 Pain Score: 2  Pain Location: Lt knee Pain Descriptors / Indicators: Discomfort;Tightness Pain Intervention(s): Limited activity within patient's tolerance;Monitored during session;Repositioned;Other (comment) (ambulated)    Home Living Family/patient expects to be discharged to:: Private residence Living Arrangements: Spouse/significant other Available Help at Discharge: Family;Available 24 hours/day Type of Home: House Home Access: Stairs to enter Entrance Stairs-Rails: Left Entrance Stairs-Number of Steps: 2 Home Layout: Two level;Able to live on main level with bedroom/bathroom Home Equipment: Grab bars - toilet;Grab bars - tub/shower;Cane - single point;Walker - 2 wheels;Bedside commode      Prior Function Level of Independence: Independent with assistive device(s)         Comments: using cane s/p TKR     Hand Dominance   Dominant Hand: Left    Extremity/Trunk Assessment   Upper Extremity Assessment: Overall WFL for tasks assessed (shoulder, elbow, hand 5/5 bil)           Lower Extremity Assessment: LLE deficits/detail (RLE 5/5 throughout; sensation intact)   LLE Deficits / Details: Decreased strength and ROM due to surgery/pain  Cervical / Trunk Assessment: Normal  Communication   Communication: No difficulties  Cognition Arousal/Alertness: Awake/alert Behavior During Therapy: WFL for tasks assessed/performed Overall  Cognitive Status: Within Functional Limits for tasks assessed                      General Comments General comments (skin integrity, edema, etc.): wife present and notes his speech is not fully  back to normal    Exercises        Assessment/Plan    PT Assessment All further PT needs can be met in the next venue of care  PT Diagnosis Difficulty walking   PT Problem List Decreased strength;Decreased range of motion;Decreased activity tolerance;Decreased balance;Decreased mobility;Pain  PT Treatment Interventions     PT Goals (Current goals can be found in the Care Plan section) Acute Rehab PT Goals PT Goal Formulation: All assessment and education complete, DC therapy    Frequency     Barriers to discharge        Co-evaluation               End of Session Equipment Utilized During Treatment: Gait belt Activity Tolerance: Patient tolerated treatment well Patient left: in chair;with call bell/phone within reach;with family/visitor present Nurse Communication: Mobility status;Other (comment) (d/c from PT)    Functional Assessment Tool Used: clinical judgement Functional Limitation: Mobility: Walking and moving around Mobility: Walking and Moving Around Current Status [phone]): At least 20 percent but less than 40 percent impaired, limited or restricted Mobility: Walking and Moving Around Goal Status [phone]): At least 20 percent but less than 40 percent impaired, limited or restricted Mobility: Walking and Moving Around Discharge Status [phone]): At least 20 percent but less than 40 percent impaired, limited or restricted    Time: 2707-8675 PT Time Calculation (min) (ACUTE ONLY): 24 min   Charges:   PT Evaluation $PT Eval Low Complexity: 1 Procedure PT Treatments $Gait Training: 8-22 mins   PT G Codes:   PT G-Codes **NOT FOR INPATIENT CLASS** Functional Assessment Tool Used: clinical judgement Functional Limitation: Mobility: Walking and moving around Mobility: Walking and Moving Around Current Status ([id number]): At least 20 percent but less than 40 percent impaired, limited or restricted Mobility: Walking and Moving Around Goal Status [phone]): At least 20  percent but less than 40 percent impaired, limited or restricted Mobility: Walking and Moving Around Discharge Status [phone]): At least 20 percent but less than 40 percent impaired, limited or restricted    Christian Weeks 02/06/2016, 11:14 AM Pager [phone]

## 2016-02-06 NOTE — Progress Notes (Signed)
STROKE TEAM PROGRESS NOTE   HISTORY OF PRESENT ILLNESS Christian Weeks is a 65 y.o. male patient who presented presented to the emergency room via EMS for further evaluation of strokelike symptoms. At about 5:30 PM when he was having dinner with family, he was noted to have a sudden onset of slurred speech and right-sided weakness symptoms. 911 was called and EMS brought him into the ER, stroke code was called and patient was examined at about 7:30 PM in the ER when he arrived. Per EMS, his symptoms have significantly improvement when they arrived at his house. Initial blood sugars 103, and blood pressure [id number] systolic per EMS. Patient reports that his symptoms have resolved at this time. No focal sensory or motor symptoms now . Had some mild numbness paresthesias in the right upper extremity at the symptom onset in addition to feeling of weakness in the right arm and slurred speech . Denies vision symptoms, no symptoms involving lower extremities or left arm , denies any new or worsening balance problems . Never had prior strokes or TIAs . He reported gradual worsening of his gait for the past couple of years but nothing drastic today .   Date last known well: 02/05/16 Time last known well: 1730 tPA Given: No: Symptoms resolved, NIHSS 0.    SUBJECTIVE (INTERVAL HISTORY) Patient family are at bedside. Symptoms now all resolved. Found to have left LE DVT, pt had left knee surgery 3 weeks ago. On eliquis now. Will do TCD bubble study. If positive PFO, will cancel TEE on Monday.   OBJECTIVE Temp:  [97.5 F (36.4 C)-98.4 F (36.9 C)] 98.2 F (36.8 C) (02/11 0633) Pulse Rate:  [61-90] 63 (02/11 0633) Cardiac Rhythm:  [-] Normal sinus rhythm (02/10 2246) Resp:  [17-19] 18 (02/11 0633) BP: (129-144)/(87-103) 134/87 mmHg (02/11 0633) SpO2:  [96 %-98 %] 98 % (02/11 0633)  CBC:  Recent Labs Lab 02/05/16 1915 02/05/16 1930  WBC 7.6  --   NEUTROABS 2.9  --   HGB 13.2 14.6  HCT 39.6 43.0  MCV  89.8  --   PLT 289  --     Basic Metabolic Panel:  Recent Labs Lab 02/05/16 1915 02/05/16 1930  NA 139 140  K 4.1 4.0  CL 103 103  CO2 25  --   GLUCOSE 120* 114*  BUN 12 14  CREATININE 1.21 1.10  CALCIUM 9.2  --     Lipid Panel:    Component Value Date/Time   CHOL 141 02/05/2016 1950   TRIG 300* 02/05/2016 1950   HDL 26* 02/05/2016 1950   CHOLHDL 5.4 02/05/2016 1950   VLDL 60* 02/05/2016 1950   LDLCALC 55 02/05/2016 1950   HgbA1c: No results found for: HGBA1C Urine Drug Screen:    Component Value Date/Time   LABOPIA NONE DETECTED 02/06/2016 0430   COCAINSCRNUR NONE DETECTED 02/06/2016 0430   LABBENZ NONE DETECTED 02/06/2016 0430   AMPHETMU NONE DETECTED 02/06/2016 0430   THCU NONE DETECTED 02/06/2016 0430   LABBARB NONE DETECTED 02/06/2016 0430      IMAGING I have personally reviewed the radiological images below and agree with the radiology interpretations.  Ct Head Wo Contrast 02/05/2016   No acute intracranial hemorrhage. Mild age-related atrophy and chronic microvascular ischemic disease. If symptoms persist and there are no contraindications, MRI may provide better evaluation if clinically indicated   [name] Wo Contrast 02/05/2016   MRI HEAD IMPRESSION:  1. Two separate acute ischemic nonhemorrhagic infarcts involving the  left frontal and left occipital lobes as above. No associated mass-effect.  2. Moderate generalized cerebral atrophy with mild chronic small vessel ischemic disease.  MRA HEAD IMPRESSION:  Negative intracranial MRA. No large or proximal arterial branch occlusion. No high-grade or correctable stenosis.   LE venous doppler - There is acute DVT noted in one of the posterior tibial veins of the left lower extremity. All other veins appear thrombus free. However, there is sluggish flow noted in the right common femoral and femoral veins.   CUS - Bilateral: 1-39% ICA stenosis. Vertebral artery flow is antegrade.  TTE bubble study -  pending  TCD bubble study - pending    PHYSICAL EXAM  Temp:  [97.5 F (36.4 C)-98.4 F (36.9 C)] 98.4 F (36.9 C) (02/11 1341) Pulse Rate:  [61-90] 67 (02/11 1341) Resp:  [16-20] 16 (02/11 1341) BP: (122-144)/(81-103) 133/87 mmHg (02/11 1341) SpO2:  [95 %-98 %] 95 % (02/11 1341)  General - Well nourished, well developed, in no apparent distress.  Ophthalmologic - Fundi not visualized due to not visualized.  Cardiovascular - Regular rate and rhythm.  Mental Status -  Level of arousal and orientation to time, place, and person were intact. Language including expression, naming, repetition, comprehension was assessed and found intact. Fund of Knowledge was assessed and was intact.  Cranial Nerves II - XII - II - Visual field intact OU. III, IV, VI - Extraocular movements intact. V - Facial sensation intact bilaterally. VII - Facial movement intact bilaterally. VIII - Hearing & vestibular intact bilaterally. X - Palate elevates symmetrically. XI - Chin turning & shoulder shrug intact bilaterally. XII - Tongue protrusion intact.  Motor Strength - The patient's strength was normal in all extremities and pronator drift was absent.  Bulk was normal and fasciculations were absent.   Motor Tone - Muscle tone was assessed at the neck and appendages and was normal.  Reflexes - The patient's reflexes were 1+ in all extremities and he had no pathological reflexes.  Sensory - Light touch, temperature/pinprick were assessed and were symmetrical.    Coordination - The patient had normal movements in the hands and feet with no ataxia or dysmetria.  Tremor was absent.  Gait and Station - not tested due to safety concerns.   ASSESSMENT/PLAN Mr. MACHEAL SKAINS is a 65 y.o. male with history of hypertension, hypothyroidism, and arthritis presenting with dysarthria, right hemiparesis, and gait disturbance. He did not receive IV t-PA due to resolution of deficits.   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 Need to confirm PFO first.   Resultant resolution of deficits  MRI Two separate acute ischemic nonhemorrhagic infarcts involving the left frontal and left occipital lobes    MRA  Negative intracranial MRA.  Carotid Doppler  unremarkable  2D Echo with bubble study Pending  Lower extremity duplex - left LE DVT  TCD bubble study - pending  LDL - 55  HgbA1c pending  VTE prophylaxis - Lovenox  Diet Heart Room service appropriate?: Yes; Fluid consistency:: Thin  aspirin 325 mg daily prior to admission, now on eliquis for DVT treatment  Patient counseled to be compliant with his antithrombotic medications  Ongoing aggressive stroke risk factor management  Therapy recommendations: Pending  Disposition: Pending  Left LE DVT  Likely due to left Knee surgery 3 weeks ago  On eliquis   Will do TCD bubble study to rule out PFO  No SCDs  Hypertension  Stable  Permissive hypertension (  OK if < 220/120) but gradually normalize in 5-7 days  Hyperlipidemia  Home meds: No lipid lowering medications prior to admission  LDL 55, goal < 70  Other Stroke Risk Factors  Advanced age  Left knee surgery 3 weeks ago  Other Active Problems  Elevated triglycerides  Hospital day # 1  Rosalin Hawking, MD PhD Stroke Neurology 02/06/2016 3:38 PM   To contact Stroke Continuity provider, please refer to [email or web address] After hours, contact General Neurology

## 2016-02-06 NOTE — Progress Notes (Deleted)
VASCULAR LAB PRELIMINARY  PRELIMINARY  PRELIMINARY  PRELIMINARY    Pearlie Lafosse, RVT 02/06/2016, 12:01 PM

## 2016-02-06 NOTE — Progress Notes (Signed)
ANTICOAGULATION CONSULT NOTE - Initial Consult  Pharmacy Consult for apixaban Indication: DVT  No Known Allergies  Patient Measurements:    Vital Signs: Temp: 98.4 F (36.9 C) (02/11 1341) Temp Source: Oral (02/11 1341) BP: 133/87 mmHg (02/11 1341) Pulse Rate: 67 (02/11 1341)  Labs:  Recent Labs  02/05/16 1915 02/05/16 1930  HGB 13.2 14.6  HCT 39.6 43.0  PLT 289  --   APTT 32  --   LABPROT 15.2  --   INR 1.19  --   CREATININE 1.21 1.10    CrCl cannot be calculated (Unknown ideal weight.).   Medical History: Past Medical History  Diagnosis Date  . Hypertension   . Hypothyroidism   . Arthritis     Medications:  Scheduled:  . apixaban  10 mg Oral BID   Followed by  . [START ON 02/13/2016] apixaban  5 mg Oral BID  . levothyroxine  137 mcg Oral QAC breakfast  . rosuvastatin  10 mg Oral Daily    Assessment: 65 y/o M presents with stroke like symptoms. Code stroke called but determined to be TIA. t-PA not given. CT shows no intracranial hemorrhage. S/P L knee arthroplasty last month with DVT found on doppler this morning. Pharmacy consulted for apixaban for DVT. SCr 1.10, Hgb 14.6. No PTA anticoag.   Goal of Therapy:  Monitor platelets by anticoagulation protocol: Yes   Plan:  Apixaban 10mg  twice daily x7 days followed by 5mg  twice daily. Monitor CBC, s/sx bleeding   Judieth Keens, PharmD PGY1 Pharmacy Resident 02/06/2016,1:58 PM

## 2016-02-07 ENCOUNTER — Inpatient Hospital Stay (HOSPITAL_COMMUNITY): Payer: PPO

## 2016-02-07 DIAGNOSIS — E038 Other specified hypothyroidism: Secondary | ICD-10-CM

## 2016-02-07 DIAGNOSIS — I63412 Cerebral infarction due to embolism of left middle cerebral artery: Principal | ICD-10-CM

## 2016-02-07 DIAGNOSIS — I639 Cerebral infarction, unspecified: Secondary | ICD-10-CM

## 2016-02-07 DIAGNOSIS — Z96652 Presence of left artificial knee joint: Secondary | ICD-10-CM

## 2016-02-07 DIAGNOSIS — Q2112 Patent foramen ovale: Secondary | ICD-10-CM | POA: Insufficient documentation

## 2016-02-07 DIAGNOSIS — E785 Hyperlipidemia, unspecified: Secondary | ICD-10-CM | POA: Insufficient documentation

## 2016-02-07 DIAGNOSIS — Q211 Atrial septal defect: Secondary | ICD-10-CM | POA: Insufficient documentation

## 2016-02-07 DIAGNOSIS — I6789 Other cerebrovascular disease: Secondary | ICD-10-CM

## 2016-02-07 DIAGNOSIS — I824Z2 Acute embolism and thrombosis of unspecified deep veins of left distal lower extremity: Secondary | ICD-10-CM

## 2016-02-07 NOTE — Progress Notes (Signed)
  Echocardiogram 2D Echocardiogram has been performed.  Christian Weeks 02/07/2016, 11:11 AM

## 2016-02-07 NOTE — Progress Notes (Signed)
STROKE TEAM PROGRESS NOTE   HISTORY OF PRESENT ILLNESS Christian Weeks is a 65 y.o. male patient who presented presented to the emergency room via EMS for further evaluation of strokelike symptoms. At about 5:30 PM when he was having dinner with family, he was noted to have a sudden onset of slurred speech and right-sided weakness symptoms. 911 was called and EMS brought him into the ER, stroke code was called and patient was examined at about 7:30 PM in the ER when he arrived. Per EMS, his symptoms have significantly improvement when they arrived at his house. Initial blood sugars 103, and blood pressure [id number] systolic per EMS. Patient reports that his symptoms have resolved at this time. No focal sensory or motor symptoms now . Had some mild numbness paresthesias in the right upper extremity at the symptom onset in addition to feeling of weakness in the right arm and slurred speech . Denies vision symptoms, no symptoms involving lower extremities or left arm , denies any new or worsening balance problems . Never had prior strokes or TIAs . He reported gradual worsening of his gait for the past couple of years but nothing drastic today .   Date last known well: 02/05/16 Time last known well: 1730 tPA Given: No: Symptoms resolved, NIHSS 0.    SUBJECTIVE (INTERVAL HISTORY) Multiple family members present. The patient denies any new complaints. TTE bubble study inconclusive. TCD bubble study positive for PFO. No need for TEE or loop.    OBJECTIVE Temp:  [97.8 F (36.6 C)-98.3 F (36.8 C)] 98.2 F (36.8 C) (02/12 0923) Pulse Rate:  [62-73] 70 (02/12 0923) Cardiac Rhythm:  [-] Normal sinus rhythm (02/12 0900) Resp:  [16] 16 (02/12 0923) BP: (121-136)/(87-94) 123/94 mmHg (02/12 0923) SpO2:  [96 %-98 %] 97 % (02/12 0923) Weight:  [120 kg (264 lb 8.8 oz)] 120 kg (264 lb 8.8 oz) (02/11 1739)  CBC:   Recent Labs Lab 02/05/16 1915 02/05/16 1930  WBC 7.6  --   NEUTROABS 2.9  --   HGB 13.2 14.6   HCT 39.6 43.0  MCV 89.8  --   PLT 289  --     Basic Metabolic Panel:   Recent Labs Lab 02/05/16 1915 02/05/16 1930  NA 139 140  K 4.1 4.0  CL 103 103  CO2 25  --   GLUCOSE 120* 114*  BUN 12 14  CREATININE 1.21 1.10  CALCIUM 9.2  --     Lipid Panel:     Component Value Date/Time   CHOL 141 02/05/2016 1950   TRIG 300* 02/05/2016 1950   HDL 26* 02/05/2016 1950   CHOLHDL 5.4 02/05/2016 1950   VLDL 60* 02/05/2016 1950   [place] 55 02/05/2016 1950   HgbA1c: No results found for: HGBA1C Urine Drug Screen:     Component Value Date/Time   LABOPIA NONE DETECTED 02/06/2016 0430   COCAINSCRNUR NONE DETECTED 02/06/2016 0430   LABBENZ NONE DETECTED 02/06/2016 0430   AMPHETMU NONE DETECTED 02/06/2016 0430   THCU NONE DETECTED 02/06/2016 0430   LABBARB NONE DETECTED 02/06/2016 0430      IMAGING I have personally reviewed the radiological images below and agree with the radiology interpretations.  Ct Head Wo Contrast 02/05/2016   No acute intracranial hemorrhage. Mild age-related atrophy and chronic microvascular ischemic disease. If symptoms persist and there are no contraindications, MRI may provide better evaluation if clinically indicated   [name] Wo Contrast 02/05/2016   MRI HEAD IMPRESSION:  1.  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 No associated mass-effect.  2. Moderate generalized cerebral atrophy with mild chronic small vessel ischemic disease.  MRA HEAD IMPRESSION:  Negative intracranial MRA. No large or proximal arterial branch occlusion. No high-grade or correctable stenosis.   LE venous doppler - There is acute DVT noted in one of the posterior tibial veins of the left lower extremity. All other veins appear thrombus free. However, there is sluggish flow noted in the right common femoral and femoral veins.   CUS - Bilateral: 1-39% ICA stenosis. Vertebral artery flow is  antegrade.  TTE bubble study - - Technically difficult; LV function low normal to mildly reduced (EF 50); saline microcavitation study with late bubbles felt to be nondiagnostic; if clinically indicated, TEE would have greater sensitivity for source of embolus.  TCD bubble study - spencer degree I at rest, but IV-V with valsalva.   PHYSICAL EXAM  Temp:  [97.8 F (36.6 C)-98.3 F (36.8 C)] 98.2 F (36.8 C) (02/12 0923) Pulse Rate:  [62-73] 70 (02/12 0923) Resp:  [16] 16 (02/12 0923) BP: (121-136)/(87-94) 123/94 mmHg (02/12 0923) SpO2:  [96 %-98 %] 97 % (02/12 0923) Weight:  [120 kg (264 lb 8.8 oz)] 120 kg (264 lb 8.8 oz) (02/11 1739)  General - Well nourished, well developed, in no apparent distress.  Ophthalmologic - Fundi not visualized due to not visualized.  Cardiovascular - Regular rate and rhythm.  Mental Status -  Level of arousal and orientation to time, place, and person were intact. Language including expression, naming, repetition, comprehension was assessed and found intact. Fund of Knowledge was assessed and was intact.  Cranial Nerves II - XII - II - Visual field intact OU. III, IV, VI - Extraocular movements intact. V - Facial sensation intact bilaterally. VII - Facial movement intact bilaterally. VIII - Hearing & vestibular intact bilaterally. X - Palate elevates symmetrically. XI - Chin turning & shoulder shrug intact bilaterally. XII - Tongue protrusion intact.  Motor Strength - The patient's strength was normal in all extremities and pronator drift was absent.  Bulk was normal and fasciculations were absent.   Motor Tone - Muscle tone was assessed at the neck and appendages and was normal.  Reflexes - The patient's reflexes were 1+ in all extremities and he had no pathological reflexes.  Sensory - Light touch, temperature/pinprick were assessed and were symmetrical.    Coordination - The patient had normal movements in the hands and feet with  no ataxia or dysmetria.  Tremor was absent.  Gait and Station - not tested due to safety concerns.   ASSESSMENT/PLAN [name] is a 65 y.o. male with history of hypertension, hypothyroidism, recent left total knee replacement (01/15/2016),  and subsequent left lower extremity DVT treated with Eliquis, presenting with dysarthria, right hemiparesis, and gait disturbance. He did not receive IV t-PA due to resolution of deficits.   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  Carotid Doppler  unremarkable  2D Echo EF 50-55%  Lower extremity duplex - left LE DVT on Eliquis  TCD bubble study positive for PFO (small at rest, large with valsalva)  No need for TEE or loop recorder. Will cancel.  LDL - 55  HgbA1c pending  VTE prophylaxis - Eliquis Diet Heart Room service appropriate?: Yes; Fluid consistency:: Thin Diet NPO time specified Except for: Sips with Meds  aspirin 325 mg daily prior to admission, now on eliquis for DVT treatment  Patient counseled to be compliant with his antithrombotic medications  Ongoing aggressive stroke risk factor management  Therapy recommendations: Pending  Disposition: Pending  Left LE DVT  Likely due to left Knee surgery 01/15/2016  On eliquis   Likely the cause of stroke due to positive PFO  No SCDs  Hypertension  Stable  Permissive hypertension (OK if < 220/120) but gradually normalize in 5-7 days  Hyperlipidemia  Home meds: No lipid lowering medications prior to admission  LDL 55, goal < 70  Other Stroke Risk Factors  Advanced age  Left knee surgery 3 weeks ago  Other Active Problems  Elevated triglycerides  Hospital day # 2  Neurology will sign off. Please call with questions. Pt will  follow up with Dr. Erlinda Hong at [facility] in about 2 months. Thanks for the consult.  Rosalin Hawking, MD PhD Stroke Neurology 02/07/2016 4:21 PM     To contact Stroke Continuity provider, please refer to [email or web address] After hours, contact General Neurology

## 2016-02-07 NOTE — Progress Notes (Signed)
Transcranial Doppler Bubble study completed with Dr. Erlinda Hong.  Verbal consent taken and risks/ benefits explained.  HITS heard at rest: Spencer degree I HITS heard during valsalva: Spencer degree IV- V  PFO size: small size at rest, large size during valsalva.    Landry Mellow, RDMS, RVT 02/07/2016

## 2016-02-07 NOTE — Progress Notes (Signed)
Patient Demographics:    Christian Weeks, is a 65 y.o. male, DOB - 02-Apr-1951, [id number]  Admit date - 02/05/2016   Admitting Physician Ivor Costa, MD  Outpatient Primary MD for the patient is Horatio Pel, MD  LOS - 2   Chief Complaint  Patient presents with  . Transient Ischemic Attack        Subjective:    [name] today has, No headache, No chest pain, No abdominal pain - No Nausea, No new weakness tingling or numbness, No Cough - SOB.  Expressive aphasia much improved and now only minimally present.   Assessment  & Plan :     1. 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 He is being followed by neurology and by PT-OT and speech as well. Symptoms much improved.  2. Recent left knee surgery. On by Dr. Lorin Mercy. Stable. +ve DVT started on eliquis.  3. High triglycerides in the setting of stroke. Added low-dose statin.  4. Hypothyroidism. Continue home dose Synthroid. TSH stable at 5.9.    Code Status : Full  Family Communication  : None present  Disposition Plan  : Home once neurology workup was done  Consults  : Neurology, cardiology  Procedures  :  CT head and MRI brain confirming left occipital and frontal lobe ischemic infarct.  Carotid ultrasound  Bubble TTE  Lower extremity venous duplex  DVT Prophylaxis  :  Lovenox    Lab Results  Component Value Date   PLT 289  02/05/2016    Inpatient Medications  Scheduled Meds: . apixaban  10 mg Oral BID   Followed by  . [START ON 02/13/2016] apixaban  5 mg Oral BID  . levothyroxine  137 mcg Oral QAC breakfast  . rosuvastatin  10 mg Oral Daily   Continuous Infusions:   PRN Meds:.oxyCODONE-acetaminophen, senna-docusate  Antibiotics  :     Anti-infectives    None        Objective:   Filed Vitals:   02/06/16 1341 02/06/16 1739 02/06/16 2216 02/07/16 0555  BP: 133/87 136/87 121/88 132/88  Pulse: 67 73 62 63  Temp: 98.4 F (36.9 C) 98.3 F (36.8 C) 98.2 F (36.8 C) 97.8 F (36.6 C)  TempSrc: Oral Oral Oral Oral  Resp: 16  16 16   Height:  6' (1.829 m)    Weight:  120 kg (264 lb 8.8 oz)    SpO2: 95% 96% 98% 96%    Wt Readings from Last 3 Encounters:  02/06/16 120 kg (264 lb 8.8 oz)  01/15/16 120.203 kg (265 lb)  01/06/16 55.611 kg (122 lb 9.6 oz)     Intake/Output Summary (Last 24 hours) at 02/07/16 0915 Last data filed at 02/06/16 1300  Gross per 24 hour  Intake    120 ml  Output      0 ml  Net  120 ml     Physical Exam  Awake Alert, Oriented X 3, No new F.N deficits, Normal affect [place].AT,PERRAL, mild l facial droop Supple Neck,No JVD, No cervical lymphadenopathy appriciated.  Symmetrical Chest wall movement, Good air movement bilaterally, CTAB RRR,No Gallops,Rubs or new Murmurs, No Parasternal Heave +ve B.Sounds, Abd Soft, No tenderness, No organomegaly appriciated, No rebound - guarding or rigidity. No Cyanosis, Clubbing or edema, No new Rash or bruise, left knee postop scar stable right leg swollen       Data Review:   Micro Results No results found for this or any previous visit (from the past 240 hour(s)).  Radiology Reports Ct Head Wo Contrast  02/05/2016  ADDENDUM REPORT: 02/05/2016 19:49 ADDENDUM: These results were called by telephone at the time of interpretation on 02/05/2016 at 7:40 pm to nurse Gilda Crease, who verbally acknowledged these results. Electronically  Signed   By: Anner Crete M.D.   On: 02/05/2016 19:49  02/05/2016  CLINICAL DATA:  65 year old male with right arm weakness and slurred speech. Code stroke. EXAM: CT HEAD WITHOUT CONTRAST TECHNIQUE: Contiguous axial images were obtained from the base of the skull through the vertex without intravenous contrast. COMPARISON:  None. FINDINGS: There is mild prominence of the ventricles and sulci compatible with age-related atrophy. Mild periventricular and deep white matter chronic microvascular ischemic changes noted. There is no acute intracranial hemorrhage. No mass effect or midline shift noted. There is mild mucoperiosteal thickening of the paranasal sinuses with partial opacification of the ethmoid air cells. The mastoid air cells are well aerated. IMPRESSION: No acute intracranial hemorrhage. Mild age-related atrophy and chronic microvascular ischemic disease. If symptoms persist and there are no contraindications, MRI may provide better evaluation if clinically indicated Electronically Signed: By: Anner Crete M.D. On: 02/05/2016 19:41   Mr [name] Head Wo Contrast  02/05/2016  CLINICAL DATA:  Initial evaluation for acute right-sided weakness. EXAM: MRI HEAD WITHOUT CONTRAST MRA HEAD WITHOUT CONTRAST TECHNIQUE: Multiplanar, multiecho pulse sequences of the brain and surrounding structures were obtained without intravenous contrast. Angiographic images of the head were obtained using MRA technique without contrast. COMPARISON:  Prior CT from earlier the same day. FINDINGS: MRI HEAD FINDINGS Diffuse prominence of the CSF containing spaces is compatible with moderate age-related cerebral atrophy. Patchy T2/FLAIR hyperintensity within the periventricular and deep white matter both cerebral hemispheres most consistent with chronic small vessel ischemic disease, fairly mild nature. Chronic small vessel type changes present within the pons. Probable small remote lacunar infarcts within the central pons. There  is patchy and linear areas of restricted diffusion involving the mid/posterior left frontal lobe (series 5, image 16). This involves the cortical gray matter and underlying subcortical/ deep white matter. An additional area of patchy restricted diffusion involves the cortical gray matter of the left occipital lobe (series 4, image 23). No other areas of acute infarction. No associated mass effect or hemorrhage. Major intracranial vascular flow voids are maintained. No mass lesion, midline shift, or mass effect. No hydrocephalus. No extra-axial fluid collection. Craniocervical junction within normal limits. Pituitary gland unremarkable. No acute abnormality about the orbits. Mild mucosal thickening throughout the paranasal sinuses with scattered opacity within the left ethmoidal air cells. No significant mastoid effusion. Inner ear structures grossly normal. Bone marrow signal intensity within normal limits. No scalp soft tissue abnormality. MRA HEAD FINDINGS ANTERIOR CIRCULATION: Visualized distal cervical segments of the internal carotid arteries are mildly tortuous but widely patent with antegrade flow. Petrous, cavernous, supraclinoid segments are widely patent. A1  segments, anterior communicating artery, and anterior cerebral arteries well opacified. M1 segments widely patent without stenosis or occlusion. MCA bifurcations within normal limits. Distal MCA branches fairly symmetric and well opacified. POSTERIOR CIRCULATION: Vertebral arteries patent to the vertebrobasilar junction. Right posterior inferior cerebral artery patent. Left posterior inferior cerebral artery not visualized. Basilar artery mildly tortuous but widely patent to its distal aspect. Superior cerebellar arteries well opacified bilaterally. Both of the posterior cerebral arteries arise knee basilar artery and are well opacified to their distal aspects. A small left posterior communicating artery noted. No aneurysm or vascular malformation.  IMPRESSION: MRI HEAD IMPRESSION: 1. 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 No associated mass-effect. 2. Moderate generalized cerebral atrophy with mild chronic small vessel ischemic disease. MRA HEAD IMPRESSION: Negative intracranial MRA. No large or proximal arterial branch occlusion. No high-grade or correctable stenosis. Electronically Signed   By: Jeannine Boga M.D.   On: 02/05/2016 22:40   Mr Brain Wo Contrast  02/05/2016  CLINICAL DATA:  Initial evaluation for acute right-sided weakness. EXAM: MRI HEAD WITHOUT CONTRAST MRA HEAD WITHOUT CONTRAST TECHNIQUE: Multiplanar, multiecho pulse sequences of the brain and surrounding structures were obtained without intravenous contrast. Angiographic images of the head were obtained using MRA technique without contrast. COMPARISON:  Prior CT from earlier the same day. FINDINGS: MRI HEAD FINDINGS Diffuse prominence of the CSF containing spaces is compatible with moderate age-related cerebral atrophy. Patchy T2/FLAIR hyperintensity within the periventricular and deep white matter both cerebral hemispheres most consistent with chronic small vessel ischemic disease, fairly mild nature. Chronic small vessel type changes present within the pons. Probable small remote lacunar infarcts within the central pons. There is patchy and linear areas of restricted diffusion involving the mid/posterior left frontal lobe (series 5, image 16). This involves the cortical gray matter and underlying subcortical/ deep white matter. An additional area of patchy restricted diffusion involves the cortical gray matter of the left occipital lobe (series 4, image 23). No other areas of acute infarction. No associated mass effect or hemorrhage. Major intracranial vascular flow voids are maintained. No mass lesion, midline shift, or mass effect. No hydrocephalus. No extra-axial fluid collection. Craniocervical  junction within normal limits. Pituitary gland unremarkable. No acute abnormality about the orbits. Mild mucosal thickening throughout the paranasal sinuses with scattered opacity within the left ethmoidal air cells. No significant mastoid effusion. Inner ear structures grossly normal. Bone marrow signal intensity within normal limits. No scalp soft tissue abnormality. MRA HEAD FINDINGS ANTERIOR CIRCULATION: Visualized distal cervical segments of the internal carotid arteries are mildly tortuous but widely patent with antegrade flow. Petrous, cavernous, supraclinoid segments are widely patent. A1 segments, anterior communicating artery, and anterior cerebral arteries well opacified. M1 segments widely patent without stenosis or occlusion. MCA bifurcations within normal limits. Distal MCA branches fairly symmetric and well opacified. POSTERIOR CIRCULATION: Vertebral arteries patent to the vertebrobasilar junction. Right posterior inferior cerebral artery patent. Left posterior inferior cerebral artery not visualized. Basilar artery mildly tortuous but widely patent to its distal aspect. Superior cerebellar arteries well opacified bilaterally. Both of the posterior cerebral arteries arise knee basilar artery and are well opacified to their distal aspects. A small left posterior communicating artery noted. No aneurysm or vascular malformation. IMPRESSION: MRI HEAD IMPRESSION: 1. 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 No associated mass-effect. 2. Moderate generalized cerebral atrophy with mild chronic small vessel ischemic disease. MRA HEAD IMPRESSION:  Negative intracranial MRA. No large or proximal arterial branch occlusion. No high-grade or correctable stenosis. Electronically Signed   By: Jeannine Boga M.D.   On: 02/05/2016 22:40     CBC  Recent Labs Lab 02/05/16 1915 02/05/16 1930  WBC 7.6  --   HGB 13.2 14.6  HCT 39.6 43.0   PLT 289  --   MCV 89.8  --   MCH 29.9  --   MCHC 33.3  --   RDW 14.3  --   LYMPHSABS 3.4  --   MONOABS 0.7  --   EOSABS 0.6  --   BASOSABS 0.1  --     Chemistries   Recent Labs Lab 02/05/16 1915 02/05/16 1930  NA 139 140  K 4.1 4.0  CL 103 103  CO2 25  --   GLUCOSE 120* 114*  BUN 12 14  CREATININE 1.21 1.10  CALCIUM 9.2  --   AST 27  --   ALT 16*  --   ALKPHOS 94  --   BILITOT 0.5  --    ------------------------------------------------------------------------------------------------------------------  Recent Labs  02/05/16 1950  CHOL 141  HDL 26*  LDLCALC 55  TRIG 300*  CHOLHDL 5.4    No results found for: HGBA1C ------------------------------------------------------------------------------------------------------------------  Recent Labs  02/06/16 0515  TSH 5.938*   ------------------------------------------------------------------------------------------------------------------  Recent Labs  02/06/16 0515  VITAMINB12 231  FOLATE 7.6    Coagulation profile  Recent Labs Lab 02/05/16 1915  INR 1.19    No results for input(s): DDIMER in the last 72 hours.  Cardiac Enzymes No results for input(s): CKMB, TROPONINI, MYOGLOBIN in the last 168 hours.  Invalid input(s): CK ------------------------------------------------------------------------------------------------------------------ No results found for: BNP  Time Spent in minutes  35   Lala Lund K M.D on 02/07/2016 at 9:15 AM  Between 7am to 7pm - Pager - [phone]  After 7pm go to [email or web address] - password [email or web address]  [facility] -  Office  [phone]

## 2016-02-07 NOTE — Care Management Obs Status (Signed)
[facility] NOTIFICATION   Patient Details  Name: Christian Weeks MRN: [id number] Date of Birth: 03/05/1951   Medicare Observation Status Notification Given:  Yes  C44  And MOON given as pt was obs from 02/05/16 20:30 thru 02/06/16 15:52; pt is now inpatient.  Dellie Catholic, RN 02/07/2016, 9:22 AM

## 2016-02-07 NOTE — Procedures (Signed)
[facility]  [address] [place]. [place] [address].  [phone]   TRANSCRANIAL DOPPLER BUBBLE STUDY  Christian Weeks  Date of Birth: [birth year] Medical Record Number: [id number] Indications: stroke with DVT Date of Procedure: 02/07/2016 Clinical History: stroke with DVT Technical Description: Transcranial Doppler Bubble Study was performed at the bedside after taking written informed consent from the patient and explaining risk/benefits. The right middle cerebral artery was insonated using a hand held probe. And IV line had been previously inserted in the left forearm by the RN using aseptic precautions. Agitated saline injection at rest resulted < 10 HITs consistent with spencer degree I and after valsalva maneuver did result in HITS shower, consistent with spencer degree VI-V.  Impression: Positive Transcranial Doppler Bubble Study indicative of a small right to left intracardiac shunt at rest, but large RTLS during valsalva.  Results were explained to the patient. Questions were answered. It seems his stroke likely related to his DVT and PFO. Currently on anticoagulation.  Rosalin Hawking, MD PhD Stroke Neurology 02/07/2016 4:17 PM

## 2016-02-08 ENCOUNTER — Encounter: Payer: PPO | Admitting: Physical Therapy

## 2016-02-08 LAB — RPR: RPR: NONREACTIVE

## 2016-02-08 LAB — HEMOGLOBIN A1C
HEMOGLOBIN A1C: 5.4 % (ref 4.8–5.6)
MEAN PLASMA GLUCOSE: 108 mg/dL

## 2016-02-08 MED ORDER — APIXABAN 5 MG PO TABS: 10.0000 mg | ORAL_TABLET | Freq: Two times a day (BID) | ORAL | Status: DC

## 2016-02-08 MED ORDER — ROSUVASTATIN CALCIUM 10 MG PO TABS: 10.0000 mg | ORAL_TABLET | Freq: Every day | ORAL | Status: DC

## 2016-02-08 MED ORDER — APIXABAN 5 MG PO TABS: 5.0000 mg | ORAL_TABLET | Freq: Two times a day (BID) | ORAL | Status: DC

## 2016-02-08 NOTE — Care Management Note (Signed)
Case Management Note  Patient Details  Name: KIYANSH HORNECKER MRN: [id number] Date of Birth: 1951/09/17  Subjective/Objective:    Patient admitted with CVA. He is from home with his wife.                Action/Plan: Plan is for patient to discharge home today. Pt with orders for [facility] services but he is refusing at this time. He is already being seen for outpatient PT for his lt knee and wants to continue with this therapy only. CM informed him he also had ST ordered for home and he states he doesn't feel he needs this either. Patient started on Eliquis. CM has placed a benefits check and is awaiting results. CM gave the patient the 30 day free card and instructed him to ask his pharmacist about the monthly cost of Eliquis if benefits check not back by the time he discharges. CM informed him that if cost was too expensive to follow up with his PCP. Patient expressed understanding. Will updated bedside RN.   Expected Discharge Date:                  Expected Discharge Plan:  [place]  In-House Referral:     Discharge planning Services  CM Consult  Post Acute Care Choice:  Home Health (patient refusing [place] at this time) Choice offered to:  Patient  DME Arranged:    DME Agency:     HH Arranged:    [place] Agency:     Status of Service:  Completed, signed off  Medicare Important Message Given:    Date Medicare IM Given:    Medicare IM give by:    Date Additional Medicare IM Given:    Additional Medicare Important Message give by:     If discussed at [place] of Stay Meetings, dates discussed:    Additional Comments:  Pollie Friar, RN 02/08/2016, 9:18 AM

## 2016-02-08 NOTE — Progress Notes (Signed)
Patient ready for discharge to home; discharge instructions given and reviewed; Rx's given and reviewed; patient refused [place] EMMI followup; patient will make his follow up appointment with neurology within 2 months and see his PCP and ortho. Surgeon within one week. Discharged home accompanied by his son.

## 2016-02-08 NOTE — Discharge Instructions (Signed)
Follow with Primary MD Horatio Pel, MD in 7 days   Get CBC, CMP, 2 view Chest X ray checked  by Primary MD next visit.    Activity: As tolerated with Full fall precautions use walker/cane & assistance as needed   Disposition Home    Diet:   Heart Healthy  with feeding assistance and aspiration precautions.  For Heart failure patients - Check your Weight same time everyday, if you gain over 2 pounds, or you develop in leg swelling, experience more shortness of breath or chest pain, call your Primary MD immediately. Follow Cardiac Low Salt Diet and 1.5 lit/day fluid restriction.   On your next visit with your primary care physician please Get Medicines reviewed and adjusted.   Please request your Prim.MD to go over all Hospital Tests and Procedure/Radiological results at the follow up, please get all Hospital records sent to your Prim MD by signing hospital release before you go home.   If you experience worsening of your admission symptoms, develop shortness of breath, life threatening emergency, suicidal or homicidal thoughts you must seek medical attention immediately by calling 911 or calling your MD immediately  if symptoms less severe.  You Must read complete instructions/literature along with all the possible adverse reactions/side effects for all the Medicines you take and that have been prescribed to you. Take any new Medicines after you have completely understood and accpet all the possible adverse reactions/side effects.   Do not drive, operating heavy machinery, perform activities at heights, swimming or participation in water activities or provide baby sitting services if your were admitted for syncope or siezures until you have seen by Primary MD or a Neurologist and advised to do so again.  Do not drive when taking Pain medications.    Do not take more than prescribed Pain, Sleep and Anxiety Medications  Special Instructions: If you have smoked or chewed  Tobacco  in the last 2 yrs please stop smoking, stop any regular Alcohol  and or any Recreational drug use.  Wear Seat belts while driving.   Please note  You were cared for by a hospitalist during your hospital stay. If you have any questions about your discharge medications or the care you received while you were in the hospital after you are discharged, you can call the unit and asked to speak with the hospitalist on call if the hospitalist that took care of you is not available. Once you are discharged, your primary care physician will handle any further medical issues. Please note that NO REFILLS for any discharge medications will be authorized once you are discharged, as it is imperative that you return to your primary care physician (or establish a relationship with a primary care physician if you do not have one) for your aftercare needs so that they can reassess your need for medications and monitor your lab values.

## 2016-02-08 NOTE — Discharge Summary (Signed)
Christian Weeks, is a 65 y.o. male  DOB Jun 23, 1951  MRN [id number].  Admission date:  02/05/2016  Admitting Physician  Ivor Costa, MD  Discharge Date:  02/08/2016   Primary MD  Horatio Pel, MD  Recommendations for primary care physician for things to follow:   CBC, BMP in 3-4 days. Monitor secondary to factors for stroke. Monitor right leg DVT.   Admission Diagnosis  TIA (transient ischemic attack) [G45.9] Arthritis [M19.90] Calf pain, left [M79.662] Transient cerebral ischemia, unspecified transient cerebral ischemia type [G45.9] Hypothyroidism, unspecified hypothyroidism type [E03.9]   Discharge Diagnosis  TIA (transient ischemic attack) [G45.9] Arthritis [M19.90] Calf pain, left [M79.662] Transient cerebral ischemia, unspecified transient cerebral ischemia type [G45.9] Hypothyroidism, unspecified hypothyroidism type [E03.9]     Principal Problem:   Stroke [facility]) Active Problems:   Status post total left knee replacement   TIA (transient ischemic attack)   Hypothyroidism   Arthritis   Calf pain   Acute CVA (cerebrovascular accident) ([place])   PFO (patent foramen ovale)   HLD (hyperlipidemia)      Past Medical History  Diagnosis Date  . Hypertension   . Hypothyroidism   . Arthritis     Past Surgical History  Procedure Laterality Date  . Knee arthroplasty Left 01/15/2016    Procedure: COMPUTER ASSISTED TOTAL KNEE ARTHROPLASTY;  Surgeon: [name], MD;  Location: [place];  Service: Orthopedics;  Laterality: Left;       HPI  from the history and physical done on the day of admission:    Christian Weeks is a 65 y.o. male with PMH of hypertension, hypothyroidism, arthritis, recent left knee replacement, who presents with right-sided weakness, slurred speech, left arm numbness and left calf  pain.  Patient reports that he underwent a successful left knee replacement on 01/14/69. He has been recovering well. He started having mild left arm tingling and numbness, which has been going on for about 1 week. Today at about 1730, he started having right-sided weakness, slurred speech, which has gradually resolved. Currently no weakness in the right side. He seems to have mild difficulty speaking when interviewed patient. No vision changes, hearing loss, chest pain, shortness breath, cough, abdominal pain, diarrhea, symptoms of UTI. No fever or chills. Patient also reports having mild left calf tenderness.  In ED, patient was found to have negative CT head for acute abnormalities, INR 1.19, CBC 7.6, temperature normal, no tachycardia, electrolytes and renal function okay. Patient admitted to inpatient for further management treatment. Neurology was consulted.  EKG: Independently reviewed. QTC 446, LAD, no ischemic change.     Hospital Course:    1. Slurred speech and facial droop due to left frontal and occipital lobe ischemic infarct on MRI, neurology following, now on Eliquis due to presence of DVT, statin will be added as triglycerides were high although LDL was at goal. Stable carotid duplex & Bubble TTE , +ve transcranial bubble ultrasound , right leg DVT is confirmed, he most likely embolized his right leg DVT through PFO.  He is being followed by neurology and by PT-OT and speech as well. Symptoms much improved. Cleared for discharge by neurology with outpatient neurology follow-up and home health PT.  2. Recent left knee surgery. On by Dr. Lorin Mercy. Stable. +ve DVT started on eliquis. Follow with Dr. Lorin Mercy post discharge.  3. High triglycerides in the setting of stroke. Added low-dose statin.  4. Hypothyroidism. Continue home dose Synthroid. TSH stable at 5.9.   Discharge Condition: Stable  Follow UP  Follow-up Information    Follow up with Xu,Jindong, MD. Schedule an appointment  as soon as possible for a visit in 2 months.   Specialty:  Neurology   Why:  stroke clinic   Contact information:   [address] [place] [address] [phone]       Follow up with Horatio Pel, MD. Schedule an appointment as soon as possible for a visit in 1 week.   Specialty:  Internal Medicine   Contact information:   [place] [address] [phone]       Follow up with [name], MD. Schedule an appointment as soon as possible for a visit in 3 days.   Specialty:  Orthopedic Surgery   Contact information:   [place] [address] [phone]        Consults obtained - Neuro  Diet and Activity recommendation: See Discharge Instructions below  Discharge Instructions       Discharge Instructions    Ambulatory referral to Neurology    Complete by:  As directed   Pt will follow up with Dr. Erlinda Hong at [facility] in about 2 months. Thanks.     Diet - low sodium heart healthy    Complete by:  As directed      Discharge instructions    Complete by:  As directed   Follow with Primary MD Horatio Pel, MD in 7 days   Get CBC, CMP, 2 view Chest X ray checked  by Primary MD next visit.    Activity: As tolerated with Full fall precautions use walker/cane & assistance as needed   Disposition Home    Diet:   Heart Healthy  with feeding assistance and aspiration precautions.  For Heart failure patients - Check your Weight same time everyday, if you gain over 2 pounds, or you develop in leg swelling, experience more shortness of breath or chest pain, call your Primary MD immediately. Follow Cardiac Low Salt Diet and 1.5 lit/day fluid restriction.   On your next visit with your primary care physician please Get Medicines reviewed and adjusted.   Please request your Prim.MD to go over all Hospital Tests and Procedure/Radiological results at the follow up, please get all Hospital records sent to  your Prim MD by signing hospital release before you go home.   If you experience worsening of your admission symptoms, develop shortness of breath, life threatening emergency, suicidal or homicidal thoughts you must seek medical attention immediately by calling 911 or calling your MD immediately  if symptoms less severe.  You Must read complete instructions/literature along with all the possible adverse reactions/side effects for all the Medicines you take and that have been prescribed to you. Take any new Medicines after you have completely understood and accpet all the possible adverse reactions/side effects.   Do not drive, operating heavy machinery, perform activities at heights, swimming or participation in water activities or provide baby sitting services if your were admitted for syncope or siezures  until you have seen by Primary MD or a Neurologist and advised to do so again.  Do not drive when taking Pain medications.    Do not take more than prescribed Pain, Sleep and Anxiety Medications  Special Instructions: If you have smoked or chewed Tobacco  in the last 2 yrs please stop smoking, stop any regular Alcohol  and or any Recreational drug use.  Wear Seat belts while driving.   Please note  You were cared for by a hospitalist during your hospital stay. If you have any questions about your discharge medications or the care you received while you were in the hospital after you are discharged, you can call the unit and asked to speak with the hospitalist on call if the hospitalist that took care of you is not available. Once you are discharged, your primary care physician will handle any further medical issues. Please note that NO REFILLS for any discharge medications will be authorized once you are discharged, as it is imperative that you return to your primary care physician (or establish a relationship with a primary care physician if you do not have one) for your aftercare needs so  that they can reassess your need for medications and monitor your lab values.     Increase activity slowly    Complete by:  As directed              Discharge Medications       Medication List    TAKE these medications        acetaminophen 500 MG tablet  Commonly known as:  TYLENOL  Take 1,000 mg by mouth every 6 (six) hours as needed for moderate pain.     apixaban 5 MG Tabs tablet  Commonly known as:  ELIQUIS  Take 2 tablets (10 mg total) by mouth 2 (two) times daily.     apixaban 5 MG Tabs tablet  Commonly known as:  ELIQUIS  Take 1 tablet (5 mg total) by mouth 2 (two) times daily.  Start taking on:  02/13/2016     aspirin 325 MG EC tablet  Take 1 tablet (325 mg total) by mouth daily with breakfast.     levothyroxine 137 MCG tablet  Commonly known as:  SYNTHROID, LEVOTHROID  Take 137 mcg by mouth daily before breakfast.     losartan 100 MG tablet  Commonly known as:  COZAAR  Take 50 mg by mouth daily.     oxyCODONE-acetaminophen 5-325 MG tablet  Commonly known as:  ROXICET  Take 1 tablet by mouth every 4 (four) hours as needed for severe pain.     rosuvastatin 10 MG tablet  Commonly known as:  CRESTOR  Take 1 tablet (10 mg total) by mouth daily.        Major procedures and Radiology Reports - PLEASE review detailed and final reports for all details, in brief -    CT head and MRI brain confirming left occipital and frontal lobe ischemic infarct.  Carotid ultrasound - Bilateral: intimal wall thickening CCA. 1-39% ICA plaquing. Vertebral artery flow is antegrade.  Bubble TTE -   Left ventricle: The cavity size was normal. Wall thickness wasnormal. Systolic function was normal. The estimated ejection fraction was in the range of 50% to 55%. Wall motion was normal;there were no regional wall motion abnormalities. Doppler parameters are consistent with abnormal left ventricularrelaxation (grade 1 diastolic dysfunction). - Aortic root: The aortic root was  mildly dilated. - Mitral valve: Calcified annulus.  Impressions: Technically difficult; LV function low normal to mildly reduced(EF 50); saline microcavitation study with late bubbles felt tobe nondiagnostic; if clinically indicated, TEE would have greater sensitivity for source of embolus.   Lower extremity venous duplex - There is evidence of acute deep vein thrombosis involving one branch of the posterior tibial vein of the left lower extremity.   Transcranial bubble Doppler -  Positive Transcranial Doppler Bubble Study indicative of a small right to left intracardiac shunt at rest, but large RTLS during valsalva.   Ct Head Wo Contrast  02/05/2016  ADDENDUM REPORT: 02/05/2016 19:49 ADDENDUM: These results were called by telephone at the time of interpretation on 02/05/2016 at 7:40 pm to nurse Gilda Crease, who verbally acknowledged these results. Electronically Signed   By: Anner Crete M.D.   On: 02/05/2016 19:49  02/05/2016  CLINICAL DATA:  65 year old male with right arm weakness and slurred speech. Code stroke. EXAM: CT HEAD WITHOUT CONTRAST TECHNIQUE: Contiguous axial images were obtained from the base of the skull through the vertex without intravenous contrast. COMPARISON:  None. FINDINGS: There is mild prominence of the ventricles and sulci compatible with age-related atrophy. Mild periventricular and deep white matter chronic microvascular ischemic changes noted. There is no acute intracranial hemorrhage. No mass effect or midline shift noted. There is mild mucoperiosteal thickening of the paranasal sinuses with partial opacification of the ethmoid air cells. The mastoid air cells are well aerated. IMPRESSION: No acute intracranial hemorrhage. Mild age-related atrophy and chronic microvascular ischemic disease. If symptoms persist and there are no contraindications, MRI may provide better evaluation if clinically indicated Electronically Signed: By: Anner Crete M.D. On: 02/05/2016  19:41   Mr [name] Head Wo Contrast  02/05/2016  CLINICAL DATA:  Initial evaluation for acute right-sided weakness. EXAM: MRI HEAD WITHOUT CONTRAST MRA HEAD WITHOUT CONTRAST TECHNIQUE: Multiplanar, multiecho pulse sequences of the brain and surrounding structures were obtained without intravenous contrast. Angiographic images of the head were obtained using MRA technique without contrast. COMPARISON:  Prior CT from earlier the same day. FINDINGS: MRI HEAD FINDINGS Diffuse prominence of the CSF containing spaces is compatible with moderate age-related cerebral atrophy. Patchy T2/FLAIR hyperintensity within the periventricular and deep white matter both cerebral hemispheres most consistent with chronic small vessel ischemic disease, fairly mild nature. Chronic small vessel type changes present within the pons. Probable small remote lacunar infarcts within the central pons. There is patchy and linear areas of restricted diffusion involving the mid/posterior left frontal lobe (series 5, image 16). This involves the cortical gray matter and underlying subcortical/ deep white matter. An additional area of patchy restricted diffusion involves the cortical gray matter of the left occipital lobe (series 4, image 23). No other areas of acute infarction. No associated mass effect or hemorrhage. Major intracranial vascular flow voids are maintained. No mass lesion, midline shift, or mass effect. No hydrocephalus. No extra-axial fluid collection. Craniocervical junction within normal limits. Pituitary gland unremarkable. No acute abnormality about the orbits. Mild mucosal thickening throughout the paranasal sinuses with scattered opacity within the left ethmoidal air cells. No significant mastoid effusion. Inner ear structures grossly normal. Bone marrow signal intensity within normal limits. No scalp soft tissue abnormality. MRA HEAD FINDINGS ANTERIOR CIRCULATION: Visualized distal cervical segments of the internal carotid  arteries are mildly tortuous but widely patent with antegrade flow. Petrous, cavernous, supraclinoid segments are widely patent. A1 segments, anterior communicating artery, and anterior cerebral arteries well opacified. M1 segments widely patent without stenosis or occlusion. MCA bifurcations within normal  limits. Distal MCA branches fairly symmetric and well opacified. POSTERIOR CIRCULATION: Vertebral arteries patent to the vertebrobasilar junction. Right posterior inferior cerebral artery patent. Left posterior inferior cerebral artery not visualized. Basilar artery mildly tortuous but widely patent to its distal aspect. Superior cerebellar arteries well opacified bilaterally. Both of the posterior cerebral arteries arise knee basilar artery and are well opacified to their distal aspects. A small left posterior communicating artery noted. No aneurysm or vascular malformation. IMPRESSION: MRI HEAD IMPRESSION: 1. 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 No associated mass-effect. 2. Moderate generalized cerebral atrophy with mild chronic small vessel ischemic disease. MRA HEAD IMPRESSION: Negative intracranial MRA. No large or proximal arterial branch occlusion. No high-grade or correctable stenosis. Electronically Signed   By: Jeannine Boga M.D.   On: 02/05/2016 22:40   Mr Brain Wo Contrast  02/05/2016  CLINICAL DATA:  Initial evaluation for acute right-sided weakness. EXAM: MRI HEAD WITHOUT CONTRAST MRA HEAD WITHOUT CONTRAST TECHNIQUE: Multiplanar, multiecho pulse sequences of the brain and surrounding structures were obtained without intravenous contrast. Angiographic images of the head were obtained using MRA technique without contrast. COMPARISON:  Prior CT from earlier the same day. FINDINGS: MRI HEAD FINDINGS Diffuse prominence of the CSF containing spaces is compatible with moderate age-related cerebral atrophy. Patchy  T2/FLAIR hyperintensity within the periventricular and deep white matter both cerebral hemispheres most consistent with chronic small vessel ischemic disease, fairly mild nature. Chronic small vessel type changes present within the pons. Probable small remote lacunar infarcts within the central pons. There is patchy and linear areas of restricted diffusion involving the mid/posterior left frontal lobe (series 5, image 16). This involves the cortical gray matter and underlying subcortical/ deep white matter. An additional area of patchy restricted diffusion involves the cortical gray matter of the left occipital lobe (series 4, image 23). No other areas of acute infarction. No associated mass effect or hemorrhage. Major intracranial vascular flow voids are maintained. No mass lesion, midline shift, or mass effect. No hydrocephalus. No extra-axial fluid collection. Craniocervical junction within normal limits. Pituitary gland unremarkable. No acute abnormality about the orbits. Mild mucosal thickening throughout the paranasal sinuses with scattered opacity within the left ethmoidal air cells. No significant mastoid effusion. Inner ear structures grossly normal. Bone marrow signal intensity within normal limits. No scalp soft tissue abnormality. MRA HEAD FINDINGS ANTERIOR CIRCULATION: Visualized distal cervical segments of the internal carotid arteries are mildly tortuous but widely patent with antegrade flow. Petrous, cavernous, supraclinoid segments are widely patent. A1 segments, anterior communicating artery, and anterior cerebral arteries well opacified. M1 segments widely patent without stenosis or occlusion. MCA bifurcations within normal limits. Distal MCA branches fairly symmetric and well opacified. POSTERIOR CIRCULATION: Vertebral arteries patent to the vertebrobasilar junction. Right posterior inferior cerebral artery patent. Left posterior inferior cerebral artery not visualized. Basilar artery mildly  tortuous but widely patent to its distal aspect. Superior cerebellar arteries well opacified bilaterally. Both of the posterior cerebral arteries arise knee basilar artery and are well opacified to their distal aspects. A small left posterior communicating artery noted. No aneurysm or vascular malformation. IMPRESSION: MRI HEAD IMPRESSION: 1. 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 No associated mass-effect. 2. Moderate generalized cerebral atrophy with mild chronic small vessel ischemic disease. MRA HEAD IMPRESSION: Negative intracranial MRA. No large or proximal arterial branch occlusion. No high-grade or correctable stenosis. Electronically Signed   By: Marland Kitchen  Jeannine Boga M.D.   On: 02/05/2016 22:40    Micro Results      No results found for this or any previous visit (from the past 240 hour(s)).     Today   Subjective    Jayr Decastro today has no headache,no chest abdominal pain,no new weakness tingling or numbness, feels much better wants to go home today.     Objective   Blood pressure 129/91, pulse 87, temperature 97.5 F (36.4 C), temperature source Oral, resp. rate 20, height 6' (1.829 m), weight 120 kg (264 lb 8.8 oz), SpO2 96 %.  No intake or output data in the 24 hours ending 02/08/16 0948  Exam Awake Alert, Oriented x 3, No new F.N deficits, Normal affect [place].AT,PERRAL Supple Neck,No JVD, No cervical lymphadenopathy appriciated.  Symmetrical Chest wall movement, Good air movement bilaterally, CTAB RRR,No Gallops,Rubs or new Murmurs, No Parasternal Heave +ve B.Sounds, Abd Soft, Non tender, No organomegaly appriciated, No rebound -guarding or rigidity. No Cyanosis, Clubbing or edema, No new Rash or bruise   Data Review   CBC w Diff:  Lab Results  Component Value Date   WBC 7.6 02/05/2016   HGB 14.6 02/05/2016   HCT 43.0 02/05/2016   PLT 289 02/05/2016   LYMPHOPCT 45 02/05/2016   MONOPCT 9  02/05/2016   EOSPCT 7 02/05/2016   BASOPCT 1 02/05/2016    CMP:  Lab Results  Component Value Date   NA 140 02/05/2016   K 4.0 02/05/2016   CL 103 02/05/2016   CO2 25 02/05/2016   BUN 14 02/05/2016   CREATININE 1.10 02/05/2016   PROT 6.7 02/05/2016   ALBUMIN 3.5 02/05/2016   BILITOT 0.5 02/05/2016   ALKPHOS 94 02/05/2016   AST 27 02/05/2016   ALT 16* 02/05/2016  . Lab Results  Component Value Date   CHOL 141 02/05/2016   HDL 26* 02/05/2016   LDLCALC 55 02/05/2016   TRIG 300* 02/05/2016   CHOLHDL 5.4 02/05/2016   No results found for: HGBA1C   Total Time in preparing paper work, data evaluation and todays exam - 35 minutes  Thurnell Lose M.D on 02/08/2016 at 9:48 AM  [facility]   Office  [phone]

## 2016-02-08 NOTE — Care Management Important Message (Signed)
Important Message  Patient Details  Name: Christian Weeks MRN: [id number] Date of Birth: 05-01-1951   Medicare Important Message Given:  Yes    Miri Jose P [place] 02/08/2016, 1:41 PM

## 2016-02-10 ENCOUNTER — Encounter: Payer: PPO | Admitting: Physical Therapy

## 2016-02-12 ENCOUNTER — Encounter: Payer: Self-pay | Admitting: Physical Therapy

## 2016-02-12 ENCOUNTER — Ambulatory Visit: Payer: PPO | Admitting: Physical Therapy

## 2016-02-12 DIAGNOSIS — M25662 Stiffness of left knee, not elsewhere classified: Secondary | ICD-10-CM

## 2016-02-12 DIAGNOSIS — M25462 Effusion, left knee: Secondary | ICD-10-CM

## 2016-02-12 DIAGNOSIS — R29898 Other symptoms and signs involving the musculoskeletal system: Secondary | ICD-10-CM | POA: Diagnosis not present

## 2016-02-12 DIAGNOSIS — R531 Weakness: Secondary | ICD-10-CM | POA: Diagnosis present

## 2016-02-12 DIAGNOSIS — R269 Unspecified abnormalities of gait and mobility: Secondary | ICD-10-CM | POA: Diagnosis present

## 2016-02-12 NOTE — Therapy (Signed)
[facility] [place], [place], [address] Phone: [phone]   Fax:  [phone]  Physical Therapy Treatment  Patient Details  Name: Christian Weeks MRN: [id number] Date of Birth: [birth year] Referring Provider: Rodell Perna, MD  Encounter Date: 02/12/2016      PT End of Session - 02/12/16 1038    Visit Number 4   Number of Visits 18   Date for PT Re-Evaluation 03/14/16   PT Start Time [id number]   PT Stop Time 1129   PT Time Calculation (min) 54 min   Activity Tolerance Patient tolerated treatment well   Behavior During Therapy [facility] for tasks assessed/performed      Past Medical History  Diagnosis Date  . Hypertension   . Hypothyroidism   . Arthritis     Past Surgical History  Procedure Laterality Date  . Knee arthroplasty Left 01/15/2016    Procedure: COMPUTER ASSISTED TOTAL KNEE ARTHROPLASTY;  Surgeon: [name], MD;  Location: [place];  Service: Orthopedics;  Laterality: Left;    There were no vitals filed for this visit.  Visit Diagnosis:  Decreased ROM of left knee  Weakness  Swelling of knee joint, left  Abnormality of gait      Subjective Assessment - 02/12/16 1036    Subjective Patient reports that he had a mild stroke last week and was told not to exercise too much until today. States he has continued to do his exercises at home but not the same as completing them in therapy. Reports he is now on blood thinners due to stroke. Reports he has been cleared to return to exercise.   Pertinent History HTN   Patient Stated Goals improve movement   Currently in Pain? Yes   Pain Score 2    Pain Location Knee   Pain Orientation Left   Pain Descriptors / Indicators Tightness   Pain Type Surgical pain            [facility] PT Assessment - 02/12/16 0001    Assessment   Medical Diagnosis L TKA   Onset Date/Surgical Date 01/15/16   Next MD Visit 02/23/16   Prior Therapy n   Precautions   Precautions None                      [facility] Adult PT Treatment/Exercise - 02/12/16 0001    Knee/Hip Exercises: Aerobic   Nustep L3, seat 10 x15 min   Knee/Hip Exercises: Standing   Forward Lunges Left;2 sets;10 reps;3 seconds  14" step   Forward Step Up Left;3 sets;10 reps;Hand Hold: 2;Step Height: 6"   Rocker Board 3 minutes   Knee/Hip Exercises: Seated   Long Arc Quad Strengthening;Left;3 sets;10 reps;Weights   Long Arc Quad Weight 3 lbs.   Knee/Hip Exercises: Supine   Straight Leg Raises Strengthening;Left;3 sets;10 reps   Knee/Hip Exercises: Sidelying   Hip ABduction Strengthening;Left;3 sets;10 reps   Modalities   Modalities Vasopneumatic;Electrical Stimulation   Electrical Stimulation   Electrical Stimulation Location L knee   Electrical Stimulation Action IFC   Electrical Stimulation Parameters 1-10 Hz x15 min   Electrical Stimulation Goals Edema;Pain   Vasopneumatic   Number Minutes Vasopneumatic  15 minutes   Vasopnuematic Location  Knee   Vasopneumatic Pressure Medium   Vasopneumatic Temperature  34                  PT Short Term Goals - 02/03/16 1121    PT SHORT TERM  GOAL #1   Title improved ROM in L knee to 5 to 105 (02/22/16)   Time 3   Period Weeks   Status On-going   PT SHORT TERM GOAL #2   Title I with initial HEP   Time 2   Period Weeks   Status Achieved   PT SHORT TERM GOAL #3   Title able to amb community distances safely without AD (02/22/16)   Time 3   Period Weeks   Status On-going   PT SHORT TERM GOAL #4   Title able to sleep without waking from pain   Time 2   Period Weeks   Status On-going           PT Long Term Goals - 02/01/16 1112    PT LONG TERM GOAL #1   Title I with advanced HEP   Time 6   Period Weeks   Status New   PT LONG TERM GOAL #2   Title improved L knee extesion to 0 to normalize gait   Time 6   Period Weeks   Status New   PT LONG TERM GOAL #3   Title improved L knee flexion to 120 degrees or better to  normalize ADLs   Time 6   Period Weeks   Status New   PT LONG TERM GOAL #4   Title demo 5/5 L knee strength   Time 6   Period Weeks   Status New               Plan - 02/12/16 1115    Clinical Impression Statement Patient tolerated today's treatment well and completed all exercises well. Verbalized compliance with HEP while he was unable to exercise in PT. Uses SPC for ambulation intermittantly per patient report. Patient was encouraged to continue HS stretches at home to improve L knee extension. Had no complaints of pain or soreness with any exercises completed today. Normal modalities response noted following removal of the modalities. Patient denied general body pain following today's treatment.   Pt will benefit from skilled therapeutic intervention in order to improve on the following deficits Abnormal gait;Decreased range of motion;Pain;Decreased activity tolerance;Impaired flexibility;Decreased strength;Decreased mobility;Increased edema   Rehab Potential Excellent   PT Frequency 3x / week   PT Duration 6 weeks   PT Treatment/Interventions ADLs/Self Care Home Management;Cryotherapy;Electrical Stimulation;Moist Heat;Therapeutic exercise;Gait training;Neuromuscular re-education;Patient/family education;Passive range of motion   PT Next Visit Plan Nustep progress to bike; TKA protocol; modalities for edema and pain as needed.   PT Home Exercise Plan HEP- HS stretches given previously   Consulted and Agree with Plan of Care Patient        Problem List Patient Active Problem List   Diagnosis Date Noted  . PFO (patent foramen ovale)   . HLD (hyperlipidemia)   . Acute CVA (cerebrovascular accident) ([place]) 02/06/2016  . TIA (transient ischemic attack) 02/05/2016  . Stroke ([place]) 02/05/2016  . Hypothyroidism   . Arthritis   . Calf pain   . Status post total left knee replacement 01/15/2016    Wynelle Fanny, PTA 02/12/2016, 11:33 AM  [facility] [place], [place], [address] Phone: [phone]   Fax:  [phone]  Name: Christian Weeks MRN: [id number] Date of Birth: Jan 06, 1951

## 2016-02-15 ENCOUNTER — Ambulatory Visit: Payer: PPO | Admitting: Physical Therapy

## 2016-02-15 ENCOUNTER — Encounter: Payer: Self-pay | Admitting: Physical Therapy

## 2016-02-15 DIAGNOSIS — R531 Weakness: Secondary | ICD-10-CM

## 2016-02-15 DIAGNOSIS — M25462 Effusion, left knee: Secondary | ICD-10-CM

## 2016-02-15 DIAGNOSIS — R269 Unspecified abnormalities of gait and mobility: Secondary | ICD-10-CM

## 2016-02-15 DIAGNOSIS — M25662 Stiffness of left knee, not elsewhere classified: Secondary | ICD-10-CM

## 2016-02-15 DIAGNOSIS — R29898 Other symptoms and signs involving the musculoskeletal system: Secondary | ICD-10-CM | POA: Diagnosis not present

## 2016-02-15 NOTE — Therapy (Signed)
[facility] [place], [place], [address] Phone: [phone]   Fax:  [phone]  Physical Therapy Treatment  Patient Details  Name: Christian Weeks MRN: [id number] Date of Birth: Aug 02, 1951 Referring Provider: Rodell Perna, MD  Encounter Date: 02/15/2016      PT End of Session - 02/15/16 0818    Visit Number 5   Number of Visits 18   Date for PT Re-Evaluation 03/14/16   PT Start Time 0729   PT Stop Time 0830   PT Time Calculation (min) 61 min   Activity Tolerance Patient tolerated treatment well   Behavior During Therapy [facility] for tasks assessed/performed      Past Medical History  Diagnosis Date  . Hypertension   . Hypothyroidism   . Arthritis     Past Surgical History  Procedure Laterality Date  . Knee arthroplasty Left 01/15/2016    Procedure: COMPUTER ASSISTED TOTAL KNEE ARTHROPLASTY;  Surgeon: [name], MD;  Location: [place];  Service: Orthopedics;  Laterality: Left;    There were no vitals filed for this visit.  Visit Diagnosis:  Decreased ROM of left knee  Weakness  Swelling of knee joint, left  Abnormality of gait      Subjective Assessment - 02/15/16 0733    Subjective Patient did good after last treatment, some stiffness in knee today   Pertinent History HTN   Patient Stated Goals improve movement   Currently in Pain? Yes   Pain Score 2    Pain Location Knee   Pain Orientation Left   Pain Descriptors / Indicators Sore;Tightness   Pain Type Surgical pain   Pain Frequency Intermittent   Aggravating Factors  bending knee   Pain Relieving Factors rest            [facility] PT Assessment - 02/15/16 0001    ROM / Strength   AROM / PROM / Strength AROM;PROM   AROM   AROM Assessment Site Knee   Right/Left Knee Left   Left Knee Extension -14   Left Knee Flexion 100   PROM   PROM Assessment Site Knee   Right/Left Knee Left   Left Knee Extension -10   Left Knee Flexion 110                      [facility] Adult PT Treatment/Exercise - 02/15/16 0001    Knee/Hip Exercises: Aerobic   Nustep x72min L4 seat 10-7 for ROM   Knee/Hip Exercises: Standing   Lateral Step Up Left;3 sets;10 reps;Hand Hold: 2;Step Height: 6"   Forward Step Up Left;3 sets;10 reps;Hand Hold: 2;Step Height: 6"   Rocker Board 3 minutes   Electrical Stimulation   Electrical Stimulation Location L knee   Electrical Stimulation Action IFC   Electrical Stimulation Parameters 1-10hz    Electrical Stimulation Goals Edema;Pain   Vasopneumatic   Number Minutes Vasopneumatic  15 minutes   Vasopnuematic Location  Knee   Vasopneumatic Pressure Medium   Manual Therapy   Manual Therapy Passive ROM   Passive ROM PROM for left knee flexion and ext with low load holds to increase ROM                  PT Short Term Goals - 02/03/16 1121    PT SHORT TERM GOAL #1   Title improved ROM in L knee to 5 to 105 (02/22/16)   Time 3   Period Weeks   Status On-going   PT  SHORT TERM GOAL #2   Title I with initial HEP   Time 2   Period Weeks   Status Achieved   PT SHORT TERM GOAL #3   Title able to amb community distances safely without AD (02/22/16)   Time 3   Period Weeks   Status On-going   PT SHORT TERM GOAL #4   Title able to sleep without waking from pain   Time 2   Period Weeks   Status On-going           PT Long Term Goals - 02/01/16 1112    PT LONG TERM GOAL #1   Title I with advanced HEP   Time 6   Period Weeks   Status New   PT LONG TERM GOAL #2   Title improved L knee extesion to 0 to normalize gait   Time 6   Period Weeks   Status New   PT LONG TERM GOAL #3   Title improved L knee flexion to 120 degrees or better to normalize ADLs   Time 6   Period Weeks   Status New   PT LONG TERM GOAL #4   Title demo 5/5 L knee strength   Time 6   Period Weeks   Status New               Plan - 02/15/16 [id number]    Clinical Impression Statement Patient  progressing with knee ROM with flexion today, ext needs some increased passive and self stretching due to tightness. patient reported doing self stretches daily. Patient has 2/10 pain at rest and up to 5-6/10 with ROM. Patient unable to meet any further goals due to pain and full ROM deficits   Pt will benefit from skilled therapeutic intervention in order to improve on the following deficits Abnormal gait;Decreased range of motion;Pain;Decreased activity tolerance;Impaired flexibility;Decreased strength;Decreased mobility;Increased edema   Rehab Potential Excellent   PT Frequency 3x / week   PT Duration 6 weeks   PT Treatment/Interventions ADLs/Self Care Home Management;Cryotherapy;Electrical Stimulation;Moist Heat;Therapeutic exercise;Gait training;Neuromuscular re-education;Patient/family education;Passive range of motion   PT Next Visit Plan cont with POC, needs work on ROM esp ext (MD. Lorin Mercy 02/23/16)   Consulted and Agree with Plan of Care Patient        Problem List Patient Active Problem List   Diagnosis Date Noted  . PFO (patent foramen ovale)   . HLD (hyperlipidemia)   . Acute CVA (cerebrovascular accident) ([place]) 02/06/2016  . TIA (transient ischemic attack) 02/05/2016  . Stroke ([place]) 02/05/2016  . Hypothyroidism   . Arthritis   . Calf pain   . Status post total left knee replacement 01/15/2016    Phillips Climes , PTA  02/15/2016, 8:37 AM  [facility] [place], [place], [address] Phone: [phone]   Fax:  [phone]  Name: Christian Weeks MRN: [id number] Date of Birth: 1951/03/24

## 2016-02-17 ENCOUNTER — Encounter: Payer: Self-pay | Admitting: Physical Therapy

## 2016-02-17 ENCOUNTER — Ambulatory Visit: Payer: PPO | Admitting: Physical Therapy

## 2016-02-17 DIAGNOSIS — M25462 Effusion, left knee: Secondary | ICD-10-CM

## 2016-02-17 DIAGNOSIS — R29898 Other symptoms and signs involving the musculoskeletal system: Secondary | ICD-10-CM | POA: Diagnosis not present

## 2016-02-17 DIAGNOSIS — R269 Unspecified abnormalities of gait and mobility: Secondary | ICD-10-CM

## 2016-02-17 DIAGNOSIS — M25662 Stiffness of left knee, not elsewhere classified: Secondary | ICD-10-CM

## 2016-02-17 DIAGNOSIS — R531 Weakness: Secondary | ICD-10-CM

## 2016-02-17 NOTE — Therapy (Signed)
[facility] [place], [place], [address] Phone: [phone]   Fax:  [phone]  Physical Therapy Treatment  Patient Details  Name: Christian Weeks MRN: [id number] Date of Birth: 03/15/1951 Referring Provider: Rodell Perna, MD  Encounter Date: 02/17/2016      PT End of Session - 02/17/16 0943    Visit Number 6   Number of Visits 18   Date for PT Re-Evaluation 03/14/16   PT Start Time 0935   PT Stop Time 1033   PT Time Calculation (min) 58 min   Activity Tolerance Patient tolerated treatment well   Behavior During Therapy [facility] for tasks assessed/performed      Past Medical History  Diagnosis Date  . Hypertension   . Hypothyroidism   . Arthritis     Past Surgical History  Procedure Laterality Date  . Knee arthroplasty Left 01/15/2016    Procedure: COMPUTER ASSISTED TOTAL KNEE ARTHROPLASTY;  Surgeon: [name], MD;  Location: [place];  Service: Orthopedics;  Laterality: Left;    There were no vitals filed for this visit.  Visit Diagnosis:  Decreased ROM of left knee  Weakness  Swelling of knee joint, left  Abnormality of gait      Subjective Assessment - 02/17/16 0935    Subjective Reports he had a lot of soreness yesterday and didn't sleep well last night but better this morning. Reports L knee not swelling as much anymore.   Pertinent History HTN   Limitations Other (comment)   Patient Stated Goals improve movement   Currently in Pain? Yes   Pain Score 3    Pain Location Knee   Pain Orientation Left   Pain Descriptors / Indicators Sore   Pain Type Surgical pain            [facility] PT Assessment - 02/17/16 0001    Assessment   Medical Diagnosis L TKA   Onset Date/Surgical Date 01/15/16   Next MD Visit 02/23/16   Prior Therapy n   Precautions   Precautions None   ROM / Strength   AROM / PROM / Strength AROM;PROM   AROM   Overall AROM  Deficits   AROM Assessment Site Knee   Right/Left Knee  Left   Left Knee Extension -10   Left Knee Flexion 101   PROM   Overall PROM  Deficits   PROM Assessment Site Knee   Right/Left Knee Left   Left Knee Extension -10   Left Knee Flexion 118                     [facility] Adult PT Treatment/Exercise - 02/17/16 0001    Knee/Hip Exercises: Aerobic   Stationary Bike L1 x10 min to further improve ROM   Nustep L6 x10 min, seat 11-8 for ROM improvement   Knee/Hip Exercises: Standing   Lateral Step Up Left;3 sets;10 reps;Step Height: 6";Hand Hold: 1   Forward Step Up Left;3 sets;10 reps;Hand Hold: 2;Step Height: 6"   Rocker Board 3 minutes   Knee/Hip Exercises: Seated   Long Arc Quad Strengthening;Left;3 sets;10 reps;Weights   Long Arc Quad Weight 4 lbs.   Modalities   Modalities Vasopneumatic;Electrical Stimulation   Electrical Stimulation   Electrical Stimulation Location L knee   Electrical Stimulation Action IFC   Electrical Stimulation Parameters 1-10 Hz x15 min   Electrical Stimulation Goals Edema;Pain   Vasopneumatic   Number Minutes Vasopneumatic  15 minutes   Vasopnuematic Location  Knee  Vasopneumatic Pressure Medium   Vasopneumatic Temperature  60   Manual Therapy   Manual Therapy Passive ROM;Soft tissue mobilization   Soft tissue mobilization STW to L ITB to decrease tightness and soreness   Passive ROM PROM for left knee flexion and ext with low load holds to increase ROM                  PT Short Term Goals - 02/17/16 1023    PT SHORT TERM GOAL #1   Title improved ROM in L knee to 5 to 105 (02/22/16)   Time 3   Period Weeks   Status On-going  AROM L knee 10-101 deg as of 02/17/2016   PT SHORT TERM GOAL #2   Title I with initial HEP   Time 2   Period Weeks   Status Achieved   PT SHORT TERM GOAL #3   Title able to amb community distances safely without AD (02/22/16)   Time 3   Period Weeks   Status Partially Met  Uses AD for long distances and outdoors per patient report on 02/17/2016   PT  SHORT TERM GOAL #4   Title able to sleep without waking from pain   Time 2   Period Weeks   Status On-going           PT Long Term Goals - 02/17/16 1024    PT LONG TERM GOAL #1   Title I with advanced HEP   Time 6   Period Weeks   Status On-going   PT LONG TERM GOAL #2   Title improved L knee extesion to 0 to normalize gait   Time 6   Period Weeks   Status On-going  AROM L knee extension -10 deg from neutral 02/17/2016   PT LONG TERM GOAL #3   Title improved L knee flexion to 120 degrees or better to normalize ADLs   Time 6   Period Weeks   Status On-going  AROM L knee flexion 101 deg 02/17/2016   PT LONG TERM GOAL #4   Title demo 5/5 L knee strength   Time 6   Period Weeks   Status On-going               Plan - 02/17/16 0957    Clinical Impression Statement Patient has progressed well duirng today's treatment as he advanced from NuStep to stationary bike. Experienced L knee tightness upon start of stationary bike but following end of stationary bike patient denied pain and L knee felt decreased tightness. Patient ambulated into clinic today without AD and states he is trying to wean from AD only uses if long distance or outdoors. Completed all other stengthening exercises well without complaint of pain or compensatory strategies present. Patient continues to experienced increased pain with L knee extension passively. AROM L knee measured as 10-101 deg, PROM L knee 10-118 deg. Normal modalities response noted following removal of the modalities. Patient denied L knee pain following today's treatment.   Pt will benefit from skilled therapeutic intervention in order to improve on the following deficits Abnormal gait;Decreased range of motion;Pain;Decreased activity tolerance;Impaired flexibility;Decreased strength;Decreased mobility;Increased edema   Rehab Potential Excellent   PT Frequency 3x / week   PT Duration 6 weeks   PT Treatment/Interventions ADLs/Self Care Home  Management;Cryotherapy;Electrical Stimulation;Moist Heat;Therapeutic exercise;Gait training;Neuromuscular re-education;Patient/family education;Passive range of motion   PT Next Visit Plan cont with POC, needs work on ROM esp ext (MD. Lorin Mercy 02/23/16)   PT Home  Exercise Plan HEP- HS stretches given previously   Consulted and Agree with Plan of Care Patient        Problem List Patient Active Problem List   Diagnosis Date Noted  . PFO (patent foramen ovale)   . HLD (hyperlipidemia)   . Acute CVA (cerebrovascular accident) ([place]) 02/06/2016  . TIA (transient ischemic attack) 02/05/2016  . Stroke ([place]) 02/05/2016  . Hypothyroidism   . Arthritis   . Calf pain   . Status post total left knee replacement 01/15/2016    Wynelle Fanny, PTA 02/17/2016, 10:38 AM  [facility] [place], [place], [address] Phone: [phone]   Fax:  [phone]  Name: ASIEL CHROSTOWSKI MRN: [id number] Date of Birth: 1951-07-19

## 2016-02-19 ENCOUNTER — Encounter: Payer: Self-pay | Admitting: Physical Therapy

## 2016-02-19 ENCOUNTER — Ambulatory Visit: Payer: PPO | Admitting: Physical Therapy

## 2016-02-19 DIAGNOSIS — M25462 Effusion, left knee: Secondary | ICD-10-CM

## 2016-02-19 DIAGNOSIS — R269 Unspecified abnormalities of gait and mobility: Secondary | ICD-10-CM

## 2016-02-19 DIAGNOSIS — R29898 Other symptoms and signs involving the musculoskeletal system: Secondary | ICD-10-CM | POA: Diagnosis not present

## 2016-02-19 DIAGNOSIS — R531 Weakness: Secondary | ICD-10-CM

## 2016-02-19 DIAGNOSIS — M25662 Stiffness of left knee, not elsewhere classified: Secondary | ICD-10-CM

## 2016-02-19 NOTE — Therapy (Signed)
[facility] [place], [place], [address] Phone: [phone]   Fax:  [phone]  Physical Therapy Treatment  Patient Details  Name: Christian Weeks MRN: [id number] Date of Birth: 1951-12-02 Referring Provider: Rodell Perna, MD  Encounter Date: 02/19/2016      PT End of Session - 02/19/16 0729    Visit Number 7   Number of Visits 18   Date for PT Re-Evaluation 03/14/16   PT Start Time 0728   PT Stop Time 0818   PT Time Calculation (min) 50 min   Activity Tolerance Patient tolerated treatment well   Behavior During Therapy [facility] for tasks assessed/performed      Past Medical History  Diagnosis Date  . Hypertension   . Hypothyroidism   . Arthritis     Past Surgical History  Procedure Laterality Date  . Knee arthroplasty Left 01/15/2016    Procedure: COMPUTER ASSISTED TOTAL KNEE ARTHROPLASTY;  Surgeon: [name], MD;  Location: [place];  Service: Orthopedics;  Laterality: Left;    There were no vitals filed for this visit.  Visit Diagnosis:  Decreased ROM of left knee  Weakness  Swelling of knee joint, left  Abnormality of gait      Subjective Assessment - 02/19/16 0741    Subjective States that his knee is sore this morning especially along lateral L knee. States that he is trying to wean himself from cane and uses it only if I need it. States that he is going to work on his yard today.   Pertinent History HTN   Limitations Other (comment)   Patient Stated Goals improve movement   Currently in Pain? Yes   Pain Score 5    Pain Location Knee   Pain Orientation Left;Lateral   Pain Descriptors / Indicators Sore   Pain Type Surgical pain            [facility] PT Assessment - 02/19/16 0001    Assessment   Medical Diagnosis L TKA   Onset Date/Surgical Date 01/15/16   Next MD Visit 02/23/16   Prior Therapy n   Precautions   Precautions None                     [facility] Adult PT Treatment/Exercise  - 02/19/16 0001    Knee/Hip Exercises: Stretches   Active Hamstring Stretch Left;3 reps;30 seconds;Other (comment)  off 6" step   Knee/Hip Exercises: Aerobic   Stationary Bike L0 x12 min   Knee/Hip Exercises: Standing   Lateral Step Up Left;3 sets;10 reps;Step Height: 6";Hand Hold: 1   Forward Step Up Left;3 sets;10 reps;Hand Hold: 2;Step Height: 6"   Rocker Board 3 minutes   Knee/Hip Exercises: Seated   Long Arc Quad Strengthening;Left;3 sets;10 reps;Weights   Long Arc Quad Weight 4 lbs.   Modalities   Modalities Vasopneumatic;Electrical Stimulation   Electrical Stimulation   Electrical Stimulation Location L knee   Electrical Stimulation Action IFC   Electrical Stimulation Parameters 1-10 Hz x15 min   Electrical Stimulation Goals Edema;Pain   Vasopneumatic   Number Minutes Vasopneumatic  15 minutes   Vasopnuematic Location  Knee   Vasopneumatic Pressure Medium   Vasopneumatic Temperature  60   Manual Therapy   Manual Therapy Passive ROM;Soft tissue mobilization   Soft tissue mobilization STW to L ITB, HS to decrease tightness and soreness   Passive ROM PROM for left knee ext with low load holds to increase ROM  PT Short Term Goals - 02/17/16 1023    PT SHORT TERM GOAL #1   Title improved ROM in L knee to 5 to 105 (02/22/16)   Time 3   Period Weeks   Status On-going  AROM L knee 10-101 deg as of 02/17/2016   PT SHORT TERM GOAL #2   Title I with initial HEP   Time 2   Period Weeks   Status Achieved   PT SHORT TERM GOAL #3   Title able to amb community distances safely without AD (02/22/16)   Time 3   Period Weeks   Status Partially Met  Uses AD for long distances and outdoors per patient report on 02/17/2016   PT SHORT TERM GOAL #4   Title able to sleep without waking from pain   Time 2   Period Weeks   Status On-going           PT Long Term Goals - 02/17/16 1024    PT LONG TERM GOAL #1   Title I with advanced HEP   Time 6    Period Weeks   Status On-going   PT LONG TERM GOAL #2   Title improved L knee extesion to 0 to normalize gait   Time 6   Period Weeks   Status On-going  AROM L knee extension -10 deg from neutral 02/17/2016   PT LONG TERM GOAL #3   Title improved L knee flexion to 120 degrees or better to normalize ADLs   Time 6   Period Weeks   Status On-going  AROM L knee flexion 101 deg 02/17/2016   PT LONG TERM GOAL #4   Title demo 5/5 L knee strength   Time 6   Period Weeks   Status On-going               Plan - 02/19/16 0805    Clinical Impression Statement Patient tolerated today's treatment well although he arrived for treatment with increased L knee soreness. Patient presented with increased L HS and ITB tightness with nodules present upon palpation of the L iTB. Patient did not present in clinic with AD today. HS stretching at home and clinic is most difficult for patient per patient report. Encouraged patient to massage L iTB and to continue HS stretching at home. Normal modalites response noted following removal of the modalities. Patient denied L knee pain following today's treatment.   Pt will benefit from skilled therapeutic intervention in order to improve on the following deficits Abnormal gait;Decreased range of motion;Pain;Decreased activity tolerance;Impaired flexibility;Decreased strength;Decreased mobility;Increased edema   Rehab Potential Excellent   PT Frequency 3x / week   PT Duration 6 weeks   PT Treatment/Interventions ADLs/Self Care Home Management;Cryotherapy;Electrical Stimulation;Moist Heat;Therapeutic exercise;Gait training;Neuromuscular re-education;Patient/family education;Passive range of motion   PT Next Visit Plan Continue with MPT POC and TKR protocol for strengthening and L knee extension.   PT Home Exercise Plan HEP- HS stretches given previously   Consulted and Agree with Plan of Care Patient        Problem List Patient Active Problem List    Diagnosis Date Noted  . PFO (patent foramen ovale)   . HLD (hyperlipidemia)   . Acute CVA (cerebrovascular accident) ([place]) 02/06/2016  . TIA (transient ischemic attack) 02/05/2016  . Stroke ([place]) 02/05/2016  . Hypothyroidism   . Arthritis   . Calf pain   . Status post total left knee replacement 01/15/2016    Wynelle Fanny, PTA 02/19/2016, 8:21 AM  [facility] [place], [place], [address] Phone: [phone]   Fax:  [phone]  Name: Christian Weeks MRN: [id number] Date of Birth: [birth year]

## 2016-02-22 ENCOUNTER — Encounter: Payer: Self-pay | Admitting: Physical Therapy

## 2016-02-22 ENCOUNTER — Ambulatory Visit: Payer: PPO | Admitting: Physical Therapy

## 2016-02-22 DIAGNOSIS — R29898 Other symptoms and signs involving the musculoskeletal system: Secondary | ICD-10-CM | POA: Diagnosis not present

## 2016-02-22 DIAGNOSIS — M25462 Effusion, left knee: Secondary | ICD-10-CM

## 2016-02-22 DIAGNOSIS — R531 Weakness: Secondary | ICD-10-CM

## 2016-02-22 DIAGNOSIS — M25662 Stiffness of left knee, not elsewhere classified: Secondary | ICD-10-CM

## 2016-02-22 DIAGNOSIS — R269 Unspecified abnormalities of gait and mobility: Secondary | ICD-10-CM

## 2016-02-22 NOTE — Therapy (Addendum)
[facility] [place], [place], [address] Phone: [phone]   Fax:  [phone]  Physical Therapy Treatment  Patient Details  Name: Christian Weeks MRN: [id number] Date of Birth: August 26, 1951 Referring Provider: Rodell Perna, MD  Encounter Date: 02/22/2016      PT End of Session - 02/22/16 1119    Visit Number 8   Number of Visits 18   Date for PT Re-Evaluation 03/14/16   PT Start Time 1029   PT Stop Time 1128   PT Time Calculation (min) 59 min   Activity Tolerance Patient tolerated treatment well   Behavior During Therapy [facility] for tasks assessed/performed      Past Medical History  Diagnosis Date  . Hypertension   . Hypothyroidism   . Arthritis     Past Surgical History  Procedure Laterality Date  . Knee arthroplasty Left 01/15/2016    Procedure: COMPUTER ASSISTED TOTAL KNEE ARTHROPLASTY;  Surgeon: [name], MD;  Location: [facility];  Service: Orthopedics;  Laterality: Left;    There were no vitals filed for this visit.  Visit Diagnosis:  Decreased ROM of left knee  Weakness  Swelling of knee joint, left  Abnormality of gait      Subjective Assessment - 02/22/16 1036    Subjective Patient reported some soreness in knee today, and reported being able to do yard work over weekend   Pertinent History HTN   Patient Stated Goals improve movement   Currently in Pain? Yes   Pain Score 3    Pain Location Knee   Pain Orientation Left   Pain Descriptors / Indicators Sore   Pain Type Surgical pain   Pain Onset More than a month ago   Pain Frequency Intermittent   Aggravating Factors  bending knee   Pain Relieving Factors rest            [facility] PT Assessment - 02/22/16 0001    AROM   AROM Assessment Site Knee   Right/Left Knee Left   Left Knee Extension -14   Left Knee Flexion 103   PROM   PROM Assessment Site Knee   Right/Left Knee Left   Left Knee Extension -10   Left Knee Flexion 115                      [facility] Adult PT Treatment/Exercise - 02/22/16 0001    Knee/Hip Exercises: Aerobic   Stationary Bike x68mn L1-2   Knee/Hip Exercises: Machines for Strengthening   Cybex Knee Extension 20# 3x10   Knee/Hip Exercises: Standing   Lateral Step Up Left;3 sets;10 reps;Step Height: 6";Hand Hold: 1   Forward Step Up Left;3 sets;10 reps;Hand Hold: 2;Step Height: 6"   Rocker Board 3 minutes   Electrical Stimulation   Electrical Stimulation Location L knee   Electrical Stimulation Action IFC   Electrical Stimulation Parameters 1-'10hz'    Electrical Stimulation Goals Edema;Pain   Vasopneumatic   Number Minutes Vasopneumatic  15 minutes   Vasopnuematic Location  Knee   Vasopneumatic Pressure Medium   Manual Therapy   Manual Therapy Passive ROM   Passive ROM PROM for left knee flexion with low load stretch and hold, then ext with overpressure holds                  PT Short Term Goals - 02/22/16 1040    PT SHORT TERM GOAL #1   Title improved ROM in L knee to 5 to 105 (02/22/16)  Time 3   Period Weeks   Status On-going   PT SHORT TERM GOAL #2   Title I with initial HEP   Time 2   Period Weeks   Status Achieved   PT SHORT TERM GOAL #3   Title able to amb community distances safely without AD (02/22/16)   Period Weeks   Status Achieved  walking independently per patient (02/22/16)   PT SHORT TERM GOAL #4   Title able to sleep without waking from pain   Time 2   Period Weeks   Status Achieved  able to sleep with no pain (02/22/16)           PT Long Term Goals - 02/22/16 1114    PT LONG TERM GOAL #1   Title I with advanced HEP   Time 6   Period Weeks   Status On-going   PT LONG TERM GOAL #2   Title improved L knee extesion to 0 to normalize gait   Time 6   Period Weeks   Status On-going  AROM -14 degrees (02/22/16)   PT LONG TERM GOAL #3   Title improved L knee flexion to 120 degrees or better to normalize ADLs   Time 6   Period Weeks    Status On-going  AROM 103 degrees (02/22/16)   PT LONG TERM GOAL #4   Title demo 5/5 L knee strength   Time 6   Period Weeks   Status On-going               Plan - 02/22/16 1117    Clinical Impression Statement Patient progressing with all activities. Patient is able to walk without an assistive device and is able to sleep undisturbed. Patient is progressing with strengthening activities with no difficulty or pain. Patient has some stiffness and soreness in left knee with decrease ROM  today. Patient met STG # 3 and #4 today yet other goals ongoing due to full ROM deficits.   Pt will benefit from skilled therapeutic intervention in order to improve on the following deficits Abnormal gait;Decreased range of motion;Pain;Decreased activity tolerance;Impaired flexibility;Decreased strength;Decreased mobility;Increased edema   Rehab Potential Excellent   PT Frequency 3x / week   PT Duration 6 weeks   PT Treatment/Interventions ADLs/Self Care Home Management;Cryotherapy;Electrical Stimulation;Moist Heat;Therapeutic exercise;Gait training;Neuromuscular re-education;Patient/family education;Passive range of motion   PT Next Visit Plan Continue with MPT POC and TKR protocol for strengthening and L knee extension. (MD Lorin Mercy 02/23/16)   Consulted and Agree with Plan of Care Patient        Problem List Patient Active Problem List   Diagnosis Date Noted  . PFO (patent foramen ovale)   . HLD (hyperlipidemia)   . Acute CVA (cerebrovascular accident) ([place]) 02/06/2016  . TIA (transient ischemic attack) 02/05/2016  . Stroke ([facility]) 02/05/2016  . Hypothyroidism   . Arthritis   . Calf pain   . Status post total left knee replacement 01/15/2016   PHYSICAL THERAPY DISCHARGE SUMMARY  Visits from Start of Care: 8.  Current functional level related to goals / functional outcomes: See above.   Remaining deficits: Continued loss of left knee ROM.   Education /  Equipment: HEP. Plan: Patient agrees to discharge.  Patient goals were partially met. Patient is being discharged due to being pleased with the current functional level.  ?????      APPLE[place]ATE, [place], PTA 02/22/2016, 6:15 PM  Mali Applegate MPT [facility] [place], [place],  [place] Phone: [phone]   Fax:  [phone]  Name: BILLYJACK TROMPETER MRN: [id number] Date of Birth: [birth year]

## 2016-02-24 ENCOUNTER — Encounter: Payer: PPO | Admitting: Physical Therapy

## 2016-02-26 ENCOUNTER — Encounter: Payer: PPO | Admitting: Physical Therapy

## 2016-04-13 ENCOUNTER — Ambulatory Visit (INDEPENDENT_AMBULATORY_CARE_PROVIDER_SITE_OTHER): Payer: PPO | Admitting: Neurology

## 2016-04-13 ENCOUNTER — Ambulatory Visit: Payer: PPO | Admitting: Neurology

## 2016-04-13 ENCOUNTER — Encounter: Payer: Self-pay | Admitting: Neurology

## 2016-04-13 VITALS — BP 119/77 | HR 61 | Ht 72.0 in | Wt 249.0 lb

## 2016-04-13 DIAGNOSIS — Q211 Atrial septal defect: Secondary | ICD-10-CM | POA: Diagnosis not present

## 2016-04-13 DIAGNOSIS — Q2112 Patent foramen ovale: Secondary | ICD-10-CM

## 2016-04-13 DIAGNOSIS — I6340 Cerebral infarction due to embolism of unspecified cerebral artery: Secondary | ICD-10-CM | POA: Diagnosis not present

## 2016-04-13 DIAGNOSIS — Z9889 Other specified postprocedural states: Secondary | ICD-10-CM

## 2016-04-13 DIAGNOSIS — I82402 Acute embolism and thrombosis of unspecified deep veins of left lower extremity: Secondary | ICD-10-CM

## 2016-04-13 NOTE — Progress Notes (Signed)
STROKE NEUROLOGY FOLLOW UP NOTE  NAME: Christian Weeks DOB: October 07, 1951  REASON FOR VISIT: stroke follow up HISTORY FROM: pt and chart  Today we had the pleasure of seeing Christian Weeks in follow-up at our Neurology Clinic. Pt was accompanied by no one.   History Summary [name] is a 65 y.o. male with history of hypertension, hypothyroidism, left total knee replacement (01/15/2016) was admitted on 02/05/16 for dysarthria, right hemiparesis, and gait disturbance. MRI showed two separate acute ischemic infarcts involving the left frontal and left occipital lobes. MRA, CUS and TTE negative. LE venous doppler showed left LE DVT. TCD bubble study showed positive PFO. LDL 55 and A1C 5.4. He was started on eliquis for [facility]. He was discharged in good condition.   Interval History During the interval time, the patient has been doing well, no recurrent stroke like symptoms. Symptoms all resolved. He finished one month of eliquis and stopped by himself as he did not think he needs it. His wife also had physical deterioration from metastatic melanoma and eventually died on 04-23-16. That is another reason he did not continue eliquis therapy. He is on ASA 325 now. His BP today 119/77. Otherwise, he has no complains.    REVIEW OF SYSTEMS: Full 14 system review of systems performed and notable only for those listed below and in HPI above, all others are negative:  Constitutional:   Cardiovascular:  Ear/Nose/Throat:   Skin:  Eyes:   Respiratory:   Gastroitestinal:   Genitourinary:  Hematology/Lymphatic:   Endocrine:  Musculoskeletal:   Allergy/Immunology:   Neurological:   Psychiatric:  Sleep:   The following represents the patient's updated allergies and side effects list: No Known Allergies  The neurologically relevant items on the patient's problem list were reviewed on today's visit.  Neurologic Examination  A problem focused neurological exam (12 or more points of the single system  neurologic examination, vital signs counts as 1 point, cranial nerves count for 8 points) was performed.  Blood pressure 119/77, pulse 61, height 6' (1.829 m), weight 249 lb (112.946 kg).  General - Well nourished, well developed, in no apparent distress.  Ophthalmologic - Sharp disc margins OU.   Cardiovascular - Regular rate and rhythm with no murmur.  Mental Status -  Level of arousal and orientation to time, place, and person were intact. Language including expression, naming, repetition, comprehension was assessed and found intact. Attention span and concentration were normal. Fund of Knowledge was assessed and was intact.  Cranial Nerves II - XII - II - Visual field intact OU. III, IV, VI - Extraocular movements intact. V - Facial sensation intact bilaterally. VII - Facial movement intact bilaterally. VIII - Hearing & vestibular intact bilaterally. X - Palate elevates symmetrically. XI - Chin turning & shoulder shrug intact bilaterally. XII - Tongue protrusion intact.  Motor Strength - The patient's strength was normal in all extremities and pronator drift was absent.  Bulk was normal and fasciculations were absent.   Motor Tone - Muscle tone was assessed at the neck and appendages and was normal.  Reflexes - The patient's reflexes were 1+ in all extremities and he had no pathological reflexes.  Sensory - Light touch, temperature/pinprick were assessed and were normal.    Coordination - The patient had normal movements in the hands and feet with no ataxia or dysmetria.  Tremor was absent.  Gait and Station - The patient's transfers, posture, gait, station, and turns were observed as normal.  Functional score  mRS = 0   0 - No symptoms.   1 - No significant disability. Able to carry out all usual activities, despite some symptoms.   2 - Slight disability. Able to look after own affairs without assistance, but unable to carry out all previous activities.   3 -  Moderate disability. Requires some help, but able to walk unassisted.   4 - Moderately severe disability. Unable to attend to own bodily needs without assistance, and unable to walk unassisted.   5 - Severe disability. Requires constant nursing care and attention, bedridden, incontinent.   6 - Dead.   NIH Stroke Scale   Level Of Consciousness 0=Alert; keenly responsive 1=Not alert, but arousable by minor stimulation 2=Not alert, requires repeated stimulation 3=Responds only with reflex movements 0  LOC Questions to Month and Age 65=Answers both questions correctly 1=Answers one question correctly 2=Answers neither question correctly 0  LOC Commands      -Open/Close eyes     -Open/close grip 0=Performs both tasks correctly 1=Performs one task correctly 2=Performs neighter task correctly 0  Best Gaze 0=Normal 1=Partial gaze palsy 2=Forced deviation, or total gaze paresis 0  Visual 0=No visual loss 1=Partial hemianopia 2=Complete hemianopia 3=Bilateral hemianopia (blind including cortical blindness) 0  Facial Palsy 0=Normal symmetrical movement 1=Minor paralysis (asymmetry) 2=Partial paralysis (lower face) 3=Complete paralysis (upper and lower face) 0  Motor  0=No drift, limb holds posture for full 10 seconds 1=Drift, limb holds posture, no drift to bed 2=Some antigravity effort, cannot maintain posture, drifts to bed 3=No effort against gravity, limb falls 4=No movement Right Arm 0     Leg 0    Left Arm 0     Leg 0  Limb Ataxia 0=Absent 1=Present in one limb 2=Present in two limbs 0  Sensory 0=Normal 1=Mild to moderate sensory loss 2=Severe to total sensory loss 0  Best Language 0=No aphasia, normal 1=Mild to moderate aphasia 2=Mute, global aphasia 3=Mute, global aphasia 0  Dysarthria 0=Normal 1=Mild to moderate 2=Severe, unintelligible or mute/anarthric 0  Extinction/Neglect 0=No abnormality 1=Extinction to bilateral simultaneous stimulation 2=Profound neglect  0  Total   0     Data reviewed: I personally reviewed the images and agree with the radiology interpretations.   Component     Latest Ref Rng 02/05/2016 02/06/2016  Cholesterol     0 - 200 mg/dL 141   Triglycerides     <150 mg/dL 300 (H)   HDL Cholesterol     >40 mg/dL 26 (L)   Total CHOL/HDL Ratio      5.4   VLDL     0 - 40 mg/dL 60 (H)   LDL (calc)     0 - 99 mg/dL 55   Hemoglobin A1C     4.8 - 5.6 % 5.4   Mean Plasma Glucose      108   TSH     0.350 - 4.500 uIU/mL  5.938 (H)  Vitamin B12     180 - 914 pg/mL  231  Folate     >5.9 ng/mL  7.6  RPR     Non Reactive  Non Reactive  HIV     Non Reactive  Non Reactive    Assessment: As you may recall, he is a 65 y.o. Caucasian male with PMH of hypertension, hypothyroidism, left total knee replacement (01/15/2016) was admitted on 02/05/16 for dysarthria, right hemiparesis, and gait disturbance. MRI showed two separate acute ischemic infarcts involving the left frontal and left occipital  lobes. MRA, CUS and TTE negative. LE venous doppler showed left LE DVT. TCD bubble study showed positive PFO. LDL 55 and A1C 5.4. He was started on eliquis for [facility]. During the interval time, the patient has been doing well. Symptoms all resolved. He finished one month of eliquis and stopped by himself as he did not think he needs it. He is on ASA 325 now.   Plan:  - continue ASA for stroke prevention - check BP at home  - Follow up with your primary care physician for stroke risk factor modification. Recommend maintain blood pressure goal <130/80, diabetes with hemoglobin A1c goal below 6.5% and lipids with LDL cholesterol goal below 70 mg/dL.  - regular exercises and healthy diet - keep hydrated. If go for long ride, get out and walk every 2 hours.  - follow up in 6 months.   I spent more than 25 minutes of face to face time with the patient. Greater than 50% of time was spent in counseling and coordination of care.    No orders of the  defined types were placed in this encounter.    No orders of the defined types were placed in this encounter.    Patient Instructions  - continue ASA for stroke prevention - check BP at home  - Follow up with your primary care physician for stroke risk factor modification. Recommend maintain blood pressure goal <130/80, diabetes with hemoglobin A1c goal below 6.5% and lipids with LDL cholesterol goal below 70 mg/dL.  - regular exercises and healthy diet - keep hydrated. If you go for long ride, get out every 2 hours.  - follow up in 6 months.     Rosalin Hawking, MD PhD [facility] [address] [place], [place] [address] [phone]

## 2016-04-13 NOTE — Patient Instructions (Signed)
-   continue ASA for stroke prevention - check BP at home  - Follow up with your primary care physician for stroke risk factor modification. Recommend maintain blood pressure goal <130/80, diabetes with hemoglobin A1c goal below 6.5% and lipids with LDL cholesterol goal below 70 mg/dL.  - regular exercises and healthy diet - keep hydrated. If you go for long ride, get out every 2 hours.  - follow up in 6 months.

## 2016-10-13 ENCOUNTER — Ambulatory Visit: Payer: PPO | Admitting: Neurology

## 2016-10-14 ENCOUNTER — Ambulatory Visit (INDEPENDENT_AMBULATORY_CARE_PROVIDER_SITE_OTHER): Payer: PPO | Admitting: Orthopaedic Surgery

## 2016-10-25 ENCOUNTER — Encounter (INDEPENDENT_AMBULATORY_CARE_PROVIDER_SITE_OTHER): Payer: Self-pay | Admitting: Orthopaedic Surgery

## 2016-10-25 ENCOUNTER — Ambulatory Visit (INDEPENDENT_AMBULATORY_CARE_PROVIDER_SITE_OTHER): Payer: PPO | Admitting: Orthopaedic Surgery

## 2016-10-25 ENCOUNTER — Ambulatory Visit (INDEPENDENT_AMBULATORY_CARE_PROVIDER_SITE_OTHER): Payer: Self-pay

## 2016-10-25 ENCOUNTER — Ambulatory Visit (INDEPENDENT_AMBULATORY_CARE_PROVIDER_SITE_OTHER): Payer: PPO

## 2016-10-25 VITALS — BP 113/77 | HR 62 | Ht 72.0 in | Wt 236.0 lb

## 2016-10-25 DIAGNOSIS — M1712 Unilateral primary osteoarthritis, left knee: Secondary | ICD-10-CM | POA: Diagnosis not present

## 2016-10-25 DIAGNOSIS — M25561 Pain in right knee: Secondary | ICD-10-CM | POA: Diagnosis not present

## 2016-10-25 DIAGNOSIS — M1711 Unilateral primary osteoarthritis, right knee: Secondary | ICD-10-CM | POA: Insufficient documentation

## 2016-10-25 NOTE — Progress Notes (Signed)
Office Visit Note   Patient: Christian Weeks           Date of Birth: 13-Apr-1951           MRN: [id number] Visit Date: 10/25/2016              Requested by: Deland Pretty, MD [address] [place], [place] [address] PCP: Horatio Pel, MD   Assessment & Plan: Visit Diagnoses:  1. Right knee pain, unspecified chronicity   2. Unilateral primary osteoarthritis, left knee   3. Unilateral primary osteoarthritis, right knee   4. Post op left TKA doing well  Plan: Patient would like to proceed with right total knee arthroplasty. He had previous spinal anesthesia and did well. Risks of surgery discussed including risks of DVTs which she is artery had in the past. Use of anticoagulant discussed all questions answered he understands and requests we proceed. Patient has known PDA and had DVT in his calf with TIA/CVA after his left total knee arthroplasty. He was on pelvic rest for treatment previously. We will need to start Eliquis immediately after his surgery.  Follow-Up Instructions: No Follow-up on file.   Orders:  Orders Placed This Encounter  Procedures  . XR Knee 1-2 Views Left  . XR Knee 1-2 Views Right   No orders of the defined types were placed in this encounter.     Procedures: No procedures performed   Clinical Data: No additional findings.   Subjective: Chief Complaint  Patient presents with  . Right Knee - Pain    Patient here to discuss right knee replacement. He had left total knee in January and is doing well.  He states he has right knee pain most of the time. If he is up on it a lot during the day, the pain keeps him up at night. Patient denies swelling, but states knee feels like it wants to give sometimes when he is walking. He has a lot of trouble when walking on unlevel ground and stepping into a little place in the ground. He is taking Tylenol, especially at night, as needed. Tylenol does give him a little relief.   His wife passed  away several months ago after being diagnosed 2 months prior to that with the metastatic melanoma. Review of Systems  HENT:       Patient was classes  Respiratory: Negative.        Patient is a former smoker  Cardiovascular: Negative.        Has PDA and had DVT with TIA/CVA post left TKA  Gastrointestinal: Negative.   Endocrine: Negative.   Genitourinary: Negative.   Musculoskeletal:       Positive for history of DVT  Neurological: Negative for dizziness, facial asymmetry, speech difficulty, numbness and headaches.       History of CVA resolved with no residual.  Hematological:       Positive history of DVT     Objective: Vital Signs: BP 113/77   Pulse 62   Ht 6' (1.829 m)   Wt 236 lb (107 kg)   BMI 32.01 kg/m   Physical Exam  Constitutional: He is oriented to person, place, and time. He appears well-developed and well-nourished.  HENT:  Head: Normocephalic and atraumatic.  Eyes: EOM are normal. Pupils are equal, round, and reactive to light.  Neck: No tracheal deviation present. No thyromegaly present.  Cardiovascular: Normal rate.   Pulmonary/Chest: Effort normal. He has no wheezes.  Abdominal: Soft. Bowel  sounds are normal.  Musculoskeletal:  C orthopedic exam  Neurological: He is alert and oriented to person, place, and time.  Skin: Skin is warm and dry. Capillary refill takes less than 2 seconds.  Psychiatric: He has a normal mood and affect. His behavior is normal. Judgment and thought content normal.    Ortho Exam good cervical range of motion. Upper extremity show good muscle strength symmetrical 2+ reflexes no weakness biceps triceps brachioradialis. Patient is a mature with slight right knee limp. Left midline incision from total knee arthroplasty with good balance collateral ligaments flexion-extension full extension flexion 120 no swelling no pain good quad strength. He can walk up and down steps easily. Distal pulses are 2+ anterior tib gastrocsoleus are  normal no sciatic notch tenderness. Pelvis is level leg lengths are equal. Knee and ankle jerk are 2+. No rash or exposed skin negative Homans. Right knee crepitus medial palpable osteophytes. Patellofemoral crepitus with patellar loading.  Specialty Comments:  No specialty comments available.  Imaging: Xr Knee 1-2 Views Left  Result Date: 10/25/2016 AP and lateral standing x-rays left knee are obtained which shows post total knee arthroplasty left knee in good position and alignment with no evidence of loosening Impression:postop left total knee arthroplasty good position and alignment  Xr Knee 1-2 Views Right  Result Date: 10/25/2016 Standing AP and lateral right knee was obtained which shows end-stage tricompartmental degenerative arthritis with bone-on-bone changes medial compartment marginal osteophytes all 3 compartments. No acute fracture. Impression: Right knee osteoarthritis tricompartmental    PMFS History: Patient Active Problem List   Diagnosis Date Noted  . Unilateral primary osteoarthritis, right knee 10/25/2016  . DVT (deep venous thrombosis), left 04/13/2016  . Cerebrovascular accident (CVA) due to embolism of cerebral artery ([place]) 04/13/2016  . S/P knee surgery 04/13/2016  . PFO (patent foramen ovale)   . HLD (hyperlipidemia)   . Acute CVA (cerebrovascular accident) ([facility]) 02/06/2016  . TIA (transient ischemic attack) 02/05/2016  . Stroke ([facility]) 02/05/2016  . Hypothyroidism   . Arthritis   . Calf pain   . Status post total left knee replacement 01/15/2016   Past Medical History:  Diagnosis Date  . Arthritis   . Hypertension   . Hypothyroidism   . Stroke [facility])     Family History  Problem Relation Age of Onset  . Congestive Heart Failure Mother   . Diabetes Mother   . Thyroid disease Sister     Past Surgical History:  Procedure Laterality Date  . KNEE ARTHROPLASTY Left 01/15/2016   Procedure: COMPUTER ASSISTED TOTAL KNEE ARTHROPLASTY;  Surgeon: [name], MD;  Location: [facility];  Service: Orthopedics;  Laterality: Left;   Social History   Occupational History  . Not on file.   Social History Main Topics  . Smoking status: Former Research scientist (life sciences)  . Smokeless tobacco: Never Used  . Alcohol use No  . Drug use: No  . Sexual activity: Not on file

## 2016-11-15 ENCOUNTER — Encounter (HOSPITAL_COMMUNITY): Payer: Self-pay | Admitting: Emergency Medicine

## 2016-11-16 ENCOUNTER — Encounter (HOSPITAL_COMMUNITY): Payer: Self-pay

## 2016-11-16 ENCOUNTER — Encounter (HOSPITAL_COMMUNITY)
Admission: RE | Admit: 2016-11-16 | Discharge: 2016-11-16 | Disposition: A | Discharge status: Home / Self Care | Payer: PPO | Source: Ambulatory Visit | Attending: Orthopaedic Surgery | Admitting: Orthopaedic Surgery

## 2016-11-16 ENCOUNTER — Other Ambulatory Visit (HOSPITAL_COMMUNITY): Payer: Self-pay | Admitting: *Deleted

## 2016-11-16 DIAGNOSIS — Z01812 Encounter for preprocedural laboratory examination: Secondary | ICD-10-CM | POA: Diagnosis present

## 2016-11-16 HISTORY — DX: Personal history of urinary calculi: Z87.442

## 2016-11-16 HISTORY — DX: Acute embolism and thrombosis of unspecified vein: I82.90

## 2016-11-16 LAB — CBC
HEMATOCRIT: 44.9 % (ref 39.0–52.0)
Hemoglobin: 15.5 g/dL (ref 13.0–17.0)
MCH: 30.6 pg (ref 26.0–34.0)
MCHC: 34.5 g/dL (ref 30.0–36.0)
MCV: 88.7 fL (ref 78.0–100.0)
Platelets: 201 10*3/uL (ref 150–400)
RBC: 5.06 MIL/uL (ref 4.22–5.81)
RDW: 13.5 % (ref 11.5–15.5)
WBC: 8 10*3/uL (ref 4.0–10.5)

## 2016-11-16 LAB — URINALYSIS, ROUTINE W REFLEX MICROSCOPIC
BILIRUBIN URINE: NEGATIVE
Glucose, UA: 250 mg/dL — AB
Hgb urine dipstick: NEGATIVE
Ketones, ur: NEGATIVE mg/dL
Leukocytes, UA: NEGATIVE
NITRITE: NEGATIVE
PH: 5 (ref 5.0–8.0)
Protein, ur: NEGATIVE mg/dL
SPECIFIC GRAVITY, URINE: 1.024 (ref 1.005–1.030)

## 2016-11-16 LAB — COMPREHENSIVE METABOLIC PANEL
ALT: 18 U/L (ref 17–63)
ANION GAP: 7 (ref 5–15)
AST: 23 U/L (ref 15–41)
Albumin: 4 g/dL (ref 3.5–5.0)
Alkaline Phosphatase: 90 U/L (ref 38–126)
BILIRUBIN TOTAL: 0.7 mg/dL (ref 0.3–1.2)
BUN: 9 mg/dL (ref 6–20)
CO2: 23 mmol/L (ref 22–32)
Calcium: 9.2 mg/dL (ref 8.9–10.3)
Chloride: 108 mmol/L (ref 101–111)
Creatinine, Ser: 1 mg/dL (ref 0.61–1.24)
GFR calc Af Amer: >60 mL/min (ref >60)
Glucose, Bld: 90 mg/dL (ref 65–99)
POTASSIUM: 4.2 mmol/L (ref 3.5–5.1)
Sodium: 138 mmol/L (ref 135–145)
TOTAL PROTEIN: 7.5 g/dL (ref 6.5–8.1)

## 2016-11-16 LAB — TYPE AND SCREEN
ABO/RH(D): O POS
ANTIBODY SCREEN: NEGATIVE

## 2016-11-16 LAB — PROTIME-INR
INR: 1.04
PROTHROMBIN TIME: 13.6 s (ref 11.4–15.2)

## 2016-11-16 LAB — APTT: aPTT: 33 seconds (ref 24–36)

## 2016-11-16 LAB — ABO/RH: ABO/RH(D): O POS

## 2016-11-16 LAB — SURGICAL PCR SCREEN
MRSA, PCR: NEGATIVE
Staphylococcus aureus: POSITIVE — AB

## 2016-11-16 NOTE — Progress Notes (Signed)
Anesthesia Chart Review:  Pt is a 65 year old male scheduled for R total knee arthroplasty on 11/21/2016 with Rodell Perna, MD.   - Neurologist is Rosalin Hawking, MD, last office visit 04/13/16, 6 month f/u recommended.  - PCP is Deland Pretty, MD.   PMH includes:  HTN, hypothyroidism, stroke (L frontal and L occipital infarcts 02/05/16), DVT (01/2016). Former smoker. BMI 33. S/p L TKA 01/15/16.   Note: pt had DVT less than 1 month after TKA last winter.   Medications include: levothyroxine, losartan  Preoperative labs reviewed.    CXR 01/06/16: Mild bibasilar pleural parenchymal thickening noted most consistent with scarring. Mild cardiomegaly.  EKG 02/05/16: Sinus rhythm. Left axis deviation.  Transcranial doppler with bubbles 02/07/16:  - HITS heard at rest: Spencer degree I - HITS heard during valsalva: Spencer degree IV -V - PFO size: small size at rest, large size during valsalva. - Postive Transcranial Doppler study indicative of a large right to left intracardiac shunt with valsalava manouvre.  Echo bubble study 02/07/16:  - Left ventricle: The cavity size was normal. Wall thickness was normal. Systolic function was normal. The estimated ejection fraction was in the range of 50% to 55%. Wall motion was normal; there were no regional wall motion abnormalities. Doppler parameters are consistent with abnormal left ventricular relaxation (grade 1 diastolic dysfunction). - Aortic root: The aortic root was mildly dilated. - Mitral valve: Calcified annulus. - Impressions: Technically difficult; LV function low normal to mildly reduced (EF 50); saline microcavitation study with late bubbles felt to be nondiagnostic; if clinically indicated, TEE would have greater sensitivity for source of embolus.  Carotid duplex 02/06/16: 1-39% ICA plaquing.  Reviewed case with Dr. Lissa Hoard.  Pt's 2 infarcts and DVT within 20 days of TKA last January believed to be due to PFO identified during post-stroke work up.   Pt has not seen cardiology for this, nor is he on anticoagulation. Pt will need cardiology eval prior to surgery.Notified Sherrie in Dr. Lorin Mercy' office.   Willeen Cass, FNP-BC [facility] Phone: [phone] 11/16/2016 3:17 PM

## 2016-11-16 NOTE — Progress Notes (Signed)
Mupirocin Ointment Rx called into [facility] for positive PCR of Staph. Pt notified and will pick up Rx this evening and start it tonight

## 2016-11-16 NOTE — Progress Notes (Signed)
Pt denies cardiac history, chest pain or sob. States a couple of weeks after having left knee replacement in Jan, 2017 he had a mini stroke (speech was affected only) and was told the reason was a DVT in his leg caused the stroke. Pt has no lasting affects from stroke.

## 2016-11-16 NOTE — Pre-Procedure Instructions (Signed)
Christian Weeks  11/16/2016    Your procedure is scheduled on Monday, November 21, 2016 at 12:30 PM.   Report to [facility] Entrance "A" Admitting Office at 10:30 AM.   Call this number if you have problems the morning of surgery: [phone]   Questions prior to day of surgery, please call [phone] between 8 & 4 PM.   Remember:  Do not eat food or drink liquids after midnight Sunday, 11/20/16.  Take these medicines the morning of surgery with A SIP OF WATER: Levothyroxine (Synthroid), Oxycodone or Tylenol - if needed   Do not wear jewelry.  Do not wear lotions, powders, or cologne.  Men may shave face and neck.  Do not bring valuables to the hospital.  [facility] is not responsible for any belongings or valuables.  Contacts, dentures or bridgework may not be worn into surgery.  Leave your suitcase in the car.  After surgery it may be brought to your room.  For patients admitted to the hospital, discharge time will be determined by your treatment team.  Special instructions:  [place] - Preparing for Surgery  Before surgery, you can play an important role.  Because skin is not sterile, your skin needs to be as free of germs as possible.  You can reduce the number of germs on you skin by washing with CHG (chlorahexidine gluconate) soap before surgery.  CHG is an antiseptic cleaner which kills germs and bonds with the skin to continue killing germs even after washing.  Please DO NOT use if you have an allergy to CHG or antibacterial soaps.  If your skin becomes reddened/irritated stop using the CHG and inform your nurse when you arrive at Short Stay.  Do not shave (including legs and underarms) for at least 48 hours prior to the first CHG shower.  You may shave your face.  Please follow these instructions carefully:   1.  Shower with CHG Soap the night before surgery and the                    morning of Surgery.  2.  If you choose to wash your hair, wash your  hair first as usual with your       normal shampoo.  3.  After you shampoo, rinse your hair and body thoroughly to remove the shampoo.  4.  Use CHG as you would any other liquid soap.  You can apply chg directly       to the skin and wash gently with scrungie or a clean washcloth.  5.  Apply the CHG Soap to your body ONLY FROM THE NECK DOWN.        Do not use on open wounds or open sores.  Avoid contact with your eyes, ears, mouth and genitals (private parts).  Wash genitals (private parts) with your normal soap.  6.  Wash thoroughly, paying special attention to the area where your surgery        will be performed.  7.  Thoroughly rinse your body with warm water from the neck down.  8.  DO NOT shower/wash with your normal soap after using and rinsing off       the CHG Soap.  9.  Pat yourself dry with a clean towel.            10.  Wear clean pajamas.            11 .  Place clean sheets  on your bed the night of your first shower and do not        sleep with pets.  Day of Surgery  Do not apply any lotions the morning of surgery.  Please wear clean clothes to the hospital.   Please read over the following fact sheets that you were given.

## 2016-11-21 ENCOUNTER — Encounter (HOSPITAL_COMMUNITY): Admission: RE | Payer: Self-pay | Source: Ambulatory Visit

## 2016-11-21 ENCOUNTER — Inpatient Hospital Stay (HOSPITAL_COMMUNITY): Admission: RE | Admit: 2016-11-21 | Payer: PPO | Source: Ambulatory Visit | Admitting: Orthopaedic Surgery

## 2016-11-21 SURGERY — ARTHROPLASTY, KNEE, TOTAL
Anesthesia: Spinal | Laterality: Right

## 2016-11-22 ENCOUNTER — Encounter: Payer: Self-pay | Admitting: Cardiology

## 2016-11-30 ENCOUNTER — Ambulatory Visit (INDEPENDENT_AMBULATORY_CARE_PROVIDER_SITE_OTHER): Payer: PPO | Admitting: Cardiology

## 2016-11-30 ENCOUNTER — Encounter: Payer: Self-pay | Admitting: Cardiology

## 2016-11-30 VITALS — BP 132/84 | HR 58 | Ht 72.0 in | Wt 244.4 lb

## 2016-11-30 DIAGNOSIS — Z01810 Encounter for preprocedural cardiovascular examination: Secondary | ICD-10-CM

## 2016-11-30 NOTE — Patient Instructions (Addendum)
Medication Instructions:  Your physician recommends that you continue on your current medications as directed. Please refer to the Current Medication list given to you today.  * If you need a refill on your cardiac medications before your next appointment, please call your pharmacy.   Labwork: None ordered  Testing/Procedures: None ordered  Follow-Up: No follow up is needed at this time with Dr. Camnitz.  He will see you on an as needed basis.   Thank you for choosing [facility]!!   Cherylene Ferrufino, RN [phone]     

## 2016-11-30 NOTE — Progress Notes (Signed)
Electrophysiology Office Note   Date:  11/30/2016   ID:  Christian Weeks, DOB [birth year], MRN [id number]  PCP:  Horatio Pel, MD  Primary Electrophysiologist:  Constance Haw, MD    Chief Complaint  Patient presents with  . New Patient (Initial Visit)    Surgical Clearance     History of Present Illness: Christian Weeks is a 65 y.o. male who presents today for electrophysiology evaluation.   He has a history significant for hypertension, hypothyroidism, and CVA. He is planned for right total knee arthroplasty. He has had a left knee arthroplasty in the past. He is feeling well currently. He has been having no chest pain, shortness of breath, PND, or orthopnea. He can walk on flat ground without issues. He can climb stairs without issues, other than knee pain.   Today, he denies symptoms of palpitations, chest pain, shortness of breath, orthopnea, PND, lower extremity edema, claudication, dizziness, presyncope, syncope, bleeding, or neurologic sequela. The patient is tolerating medications without difficulties and is otherwise without complaint today.    Past Medical History:  Diagnosis Date  . Arthritis   . Blood clot in vein 01/2016  . History of kidney stones   . Hypertension   . Hypothyroidism   . Stroke [facility]) 01/2016   speech difficulty for a few hours   Past Surgical History:  Procedure Laterality Date  . KNEE ARTHROPLASTY Left 01/15/2016   Procedure: COMPUTER ASSISTED TOTAL KNEE ARTHROPLASTY;  Surgeon: [name], MD;  Location: [facility];  Service: Orthopedics;  Laterality: Left;     Current Outpatient Prescriptions  Medication Sig Dispense Refill  . acetaminophen (TYLENOL) 500 MG tablet Take 1,000 mg by mouth every 6 (six) hours as needed for moderate pain.    . Cholecalciferol (VITAMIN D3) 3000 units TABS Take 3,000 Units by mouth daily.    Marland Kitchen levothyroxine (SYNTHROID, LEVOTHROID) 137 MCG tablet Take 137 mcg by mouth daily before breakfast.    . losartan  (COZAAR) 100 MG tablet Take 50 mg by mouth daily.     No current facility-administered medications for this visit.     Allergies:   Patient has no known allergies.   Social History:  The patient  reports that he quit smoking about 20 years ago. He has never used smokeless tobacco. He reports that he does not drink alcohol or use drugs.   Family History:  The patient's family history includes Congestive Heart Failure in his mother; Diabetes in his mother; Thyroid disease in his sister.    ROS:  Please see the history of present illness.   Otherwise, review of systems is positive for knee pain.   All other systems are reviewed and negative.    PHYSICAL EXAM: VS:  BP 132/84   Pulse (!) 58   Ht 6' (1.829 m)   Wt 244 lb 6.4 oz (110.9 kg)   BMI 33.15 kg/m  , BMI Body mass index is 33.15 kg/m. GEN: Well nourished, well developed, in no acute distress  HEENT: normal  Neck: no JVD, carotid bruits, or masses Cardiac: Regular lower uterus had lower RRR; no murmurs, rubs, or gallops,no edema  Respiratory:  clear to auscultation bilaterally, normal work of breathing GI: soft, nontender, nondistended, + BS MS: no deformity or atrophy  Skin: warm and dry Neuro:  Strength and sensation are intact Psych: euthymic mood, full affect  EKG:  EKG is ordered today. Personal review of the ekg ordered shows sinus rhythm, rate 58, APCs  Recent Labs: 02/06/2016: TSH 5.938 11/16/2016: ALT 18; BUN 9; Creatinine, Ser 1.00; Hemoglobin 15.5; Platelets 201; Potassium 4.2; Sodium 138    Lipid Panel     Component Value Date/Time   CHOL 141 02/05/2016 1950   TRIG 300 (H) 02/05/2016 1950   HDL 26 (L) 02/05/2016 1950   CHOLHDL 5.4 02/05/2016 1950   VLDL 60 (H) 02/05/2016 1950   LDLCALC 55 02/05/2016 1950     Wt Readings from Last 3 Encounters:  11/30/16 244 lb 6.4 oz (110.9 kg)  11/16/16 242 lb 6.4 oz (110 kg)  10/25/16 236 lb (107 kg)      Other studies Reviewed: Additional studies/ records  that were reviewed today include: TTE 01/2016  Review of the above records today demonstrates:  - Left ventricle: The cavity size was normal. Wall thickness was   normal. Systolic function was normal. The estimated ejection   fraction was in the range of 50% to 55%. Wall motion was normal;   there were no regional wall motion abnormalities. Doppler   parameters are consistent with abnormal left ventricular   relaxation (grade 1 diastolic dysfunction). - Aortic root: The aortic root was mildly dilated. - Mitral valve: Calcified annulus.   ASSESSMENT AND PLAN:  1.  Preop evaluation: Currently he is having no episodes of chest pain, shortness of breath, PND, or orthopnea. He is having right knee arthroplasty which has yet to be scheduled. He has had a stroke in the past that was apparently related to a DVT and a PFO. As he is not having any cardiac symptoms, he would be likely an intermediate risk for an intermediate risk procedure. Work to control his blood pressure around the time of surgery. Agree with the possibility of anticoagulation after surgery to prevent DVTs and thus strokes due to his PFO.  2. Hypertension: Well-controlled today, no changes necessary.    Current medicines are reviewed at length with the patient today.   The patient does not have concerns regarding his medicines.  The following changes were made today:  none  Labs/ tests ordered today include:  Orders Placed This Encounter  Procedures  . EKG 12-Lead     Disposition:   FU with Sipriano Fendley PRN and well controlled today, no changes needed.  Signed, Shannen Vernon Meredith Leeds, MD  11/30/2016 11:01 AM     [facility] [address] [place] [address] [phone] (office) [phone] (fax)

## 2016-12-07 ENCOUNTER — Telehealth (INDEPENDENT_AMBULATORY_CARE_PROVIDER_SITE_OTHER): Payer: Self-pay | Admitting: Orthopaedic Surgery

## 2016-12-07 NOTE — Telephone Encounter (Signed)
Patient stopped by to talk with you about his knee replacement, he asked if you have received his clearance letter from his cardiologist ([email or web address]) as well. pls call him back at [phone]

## 2016-12-27 NOTE — Telephone Encounter (Signed)
I spoke with Christian Weeks.  We rescheduled surgery for 01/13/2017 at [facility]. All surgery information given.  He will stop by the office with this new insurance card for 2018.

## 2017-01-04 ENCOUNTER — Telehealth (INDEPENDENT_AMBULATORY_CARE_PROVIDER_SITE_OTHER): Payer: Self-pay | Admitting: Radiology

## 2017-01-04 ENCOUNTER — Encounter (HOSPITAL_COMMUNITY)
Admission: RE | Admit: 2017-01-04 | Discharge: 2017-01-04 | Disposition: A | Discharge status: Home / Self Care | Payer: Medicare HMO | Source: Ambulatory Visit | Attending: Surgery | Admitting: Surgery

## 2017-01-04 ENCOUNTER — Encounter (HOSPITAL_COMMUNITY)
Admission: RE | Admit: 2017-01-04 | Discharge: 2017-01-04 | Disposition: A | Discharge status: Home / Self Care | Payer: Medicare HMO | Source: Ambulatory Visit | Attending: Orthopaedic Surgery | Admitting: Orthopaedic Surgery

## 2017-01-04 ENCOUNTER — Encounter (HOSPITAL_COMMUNITY): Payer: Self-pay

## 2017-01-04 DIAGNOSIS — M1711 Unilateral primary osteoarthritis, right knee: Secondary | ICD-10-CM | POA: Insufficient documentation

## 2017-01-04 DIAGNOSIS — Z01818 Encounter for other preprocedural examination: Secondary | ICD-10-CM | POA: Diagnosis not present

## 2017-01-04 DIAGNOSIS — Z01812 Encounter for preprocedural laboratory examination: Secondary | ICD-10-CM | POA: Diagnosis present

## 2017-01-04 HISTORY — DX: Unspecified osteoarthritis, unspecified site: M19.90

## 2017-01-04 HISTORY — DX: Presence of dental prosthetic device (complete) (partial): Z97.2

## 2017-01-04 HISTORY — DX: Presence of spectacles and contact lenses: Z97.3

## 2017-01-04 LAB — BASIC METABOLIC PANEL
Anion gap: 7 (ref 5–15)
BUN: 13 mg/dL (ref 6–20)
CALCIUM: 9.2 mg/dL (ref 8.9–10.3)
CO2: 21 mmol/L — ABNORMAL LOW (ref 22–32)
CREATININE: 1.05 mg/dL (ref 0.61–1.24)
Chloride: 110 mmol/L (ref 101–111)
GFR calc Af Amer: >60 mL/min (ref >60)
GLUCOSE: 98 mg/dL (ref 65–99)
Potassium: 4.1 mmol/L (ref 3.5–5.1)
SODIUM: 138 mmol/L (ref 135–145)

## 2017-01-04 LAB — SURGICAL PCR SCREEN
MRSA, PCR: NEGATIVE
Staphylococcus aureus: POSITIVE — AB

## 2017-01-04 LAB — CBC
HCT: 46.4 % (ref 39.0–52.0)
Hemoglobin: 15.9 g/dL (ref 13.0–17.0)
MCH: 30.3 pg (ref 26.0–34.0)
MCHC: 34.3 g/dL (ref 30.0–36.0)
MCV: 88.4 fL (ref 78.0–100.0)
PLATELETS: 190 10*3/uL (ref 150–400)
RBC: 5.25 MIL/uL (ref 4.22–5.81)
RDW: 13.2 % (ref 11.5–15.5)
WBC: 9.4 10*3/uL (ref 4.0–10.5)

## 2017-01-04 NOTE — Telephone Encounter (Signed)
Call report Chest x-ray report is in EPIC on patient.

## 2017-01-04 NOTE — Pre-Procedure Instructions (Signed)
    Christian Weeks  01/04/2017      [facility] - [place], [place] - [address] [name] [address] Phone: [phone] Fax: [phone]    Your procedure is scheduled on Friday, January 13, 2017  Report to [facility] Admitting at 5:30 A.M.  Call this number if you have problems the morning of surgery:  [phone]   Remember:  Do not eat food or drink liquids after midnight Thursday, January 12, 2017  Take these medicines the morning of surgery with A SIP OF WATER :levothyroxine (SYNTHROID), if needed: Tylenol for pain Stop taking Aspirin, vitamins, fish oil and herbal medications. Do not take any NSAIDs ie: Ibuprofen, Advil, Naproxen, BC and Goody Powder or any medication containing Aspirin; stop Friday, January 06, 2017.  Do not wear jewelry, make-up or nail polish.  Do not wear lotions, powders, or perfumes, or deoderant.  Do not shave 48 hours prior to surgery.  Men may shave face and neck.  Do not bring valuables to the hospital.  [facility] is not responsible for any belongings or valuables.  Contacts, dentures or bridgework may not be worn into surgery.  Leave your suitcase in the car.  After surgery it may be brought to your room. For patients admitted to the hospital, discharge time will be determined by your treatment team. Special instructions:Shower the night before surgery and the morning of surgery with CHG. Please read over the following fact sheets that you were given. Pain Booklet, Coughing and Deep Breathing, MRSA Information and Surgical Site Infection Prevention

## 2017-01-04 NOTE — Progress Notes (Signed)
This morning I had a phone conversation with Christian Weeks in regards to preop chest x-ray that was performed today. Report read chronic bronchitic smoking-related changes. No acute pneumonia. Density in the retrocardiac region on the lateral view has been present as far back as August 2016 but no older studies are available. This likely reflects a confluence of normal densities but given the smoking history a contrast enhanced chest CT scan is recommended to more completely evaluate the longus and exclude underlying malignancy. Advised patient that I did order this study and we will see if this can be done before total knee replacement that is scheduled 19 January.

## 2017-01-04 NOTE — Progress Notes (Signed)
Pt denies SOB, chest pain, and being under the care of a cardiologist. Pt stated that he saw Dr. Curt Bears, Cardiology, for pre-op clearance only. Pt denies having a stress test and cardiac cath. Pt chart forwarded to anesthesia to review abnormal chest x ray result (  PA note in Epic regarding CXR f/u) and cardiac clearance note in Epic ( 11/30/16).

## 2017-01-06 NOTE — Progress Notes (Signed)
Anesthesia Chart Review:  Pt is a 66 year old male scheduled for R total knee arthroplasty on 01/13/17 with Rodell Perna, MD.   Surgery was originally scheduled 11/21/2016 but was postponed so pt could be evaluated by cardiology.  Pt had 2 strokes and a DVT within 20 days of TKA last January 2017 believed to be due to PFO identified during post-stroke work up. Pt saw Allegra Lai, MD with cardiology 11/30/16. Dr. Curt Bears cleared pt for surgery at intermediate risk and recommended anticoagulation after surgery to prevent DVTs and thus strokes due to his PFO.  - Neurologist is Rosalin Hawking, MD, last office visit 04/13/16, 6 month f/u recommended but has not yet happened.  - PCP is Deland Pretty, MD.   PMH includes:  HTN, hypothyroidism, stroke (L frontal and L occipital infarcts 02/05/16), DVT (01/2016). Former smoker. BMI 33. S/p L TKA 01/15/16.    Medications include: levothyroxine, losartan  Preoperative labs reviewed.    CXR 01/04/17: Chronic bronchitic-smoking related changes. No acute pneumonia. Density in the retrocardiac region on the lateral view has been present as far back as August of 2016, but no older studies are available. This likely reflects a confluence of normal densities but given the smoking history a contrast-enhanced chest CT scan is recommended to more completely evaluate the lungs and exclude underlying malignancy. - Dr. Narda Amber staff aware and CT has been ordered.   EKG 11/30/16: sinus bradycardia (58 bpm) with PACs.   Transcranial doppler with bubbles 02/07/16:  - HITS heard at rest: Spencer degree I - HITS heard during valsalva: Spencer degree IV -V - PFO size: small size at rest, large size during valsalva. - Postive Transcranial Doppler study indicative of a large right to left intracardiac shunt with valsalava manouvre.  Echo bubble study 02/07/16:  - Left ventricle: The cavity size was normal. Wall thickness was normal. Systolic function was normal. The estimated  ejectionfraction was in the range of 50% to 55%. Wall motion was normal; there were no regional wall motion abnormalities. Dopplerparameters are consistent with abnormal left ventricularrelaxation (grade 1 diastolic dysfunction). - Aortic root: The aortic root was mildly dilated. - Mitral valve: Calcified annulus. - Impressions: Technically difficult; LV function low normal to mildly reduced (EF 50); saline microcavitation study with late bubbles felt tobe nondiagnostic; if clinically indicated, TEE would have greatersensitivity for source of embolus.  Carotid duplex 02/06/16: 1-39% ICA plaquing.  If no changes, I anticipate pt can proceed with surgery as scheduled.   Willeen Cass, FNP-BC [facility]/Anesthesiology Phone: [phone] 01/06/2017 3:21 PM

## 2017-01-12 NOTE — H&P (Signed)
TOTAL KNEE ADMISSION H&P  Patient is being admitted for right total knee arthroplasty.  Subjective:  Chief Complaint:right knee pain.  HPI: Christian Weeks, 66 y.o. male, has a history of pain and functional disability in the right knee due to arthritis and has failed non-surgical conservative treatments for greater than 12 weeks to includeNSAID's and/or analgesics, corticosteriod injections, use of assistive devices and activity modification.  Onset of symptoms was gradual, starting 10 years ago with gradually worsening course since that time.  Patient currently rates pain in the right knee(s) at 10 out of 10 with activity. Patient has night pain, worsening of pain with activity and weight bearing, pain that interferes with activities of daily living, pain with passive range of motion and joint swelling.  Patient has evidence of subchondral cysts, subchondral sclerosis, periarticular osteophytes and joint space narrowing. There is no active infection.  Patient Active Problem List   Diagnosis Date Noted  . Unilateral primary osteoarthritis, right knee 10/25/2016  . DVT (deep venous thrombosis), left 04/13/2016  . Cerebrovascular accident (CVA) due to embolism of cerebral artery ([place]) 04/13/2016  . S/P knee surgery 04/13/2016  . PFO (patent foramen ovale)   . HLD (hyperlipidemia)   . Acute CVA (cerebrovascular accident) ([place]) 02/06/2016  . TIA (transient ischemic attack) 02/05/2016  . Stroke ([place]) 02/05/2016  . Hypothyroidism   . Arthritis   . Calf pain   . Status post total left knee replacement 01/15/2016   Past Medical History:  Diagnosis Date  . Arthritis   . Blood clot in vein 01/2016  . Degenerative joint disease   . History of kidney stones   . Hypertension   . Hypothyroidism   . Stroke ([place]) 01/2016   speech difficulty for a few hours  . Wears dentures   . Wears glasses     Past Surgical History:  Procedure Laterality Date  . KNEE ARTHROPLASTY Left 01/15/2016   Procedure: COMPUTER ASSISTED TOTAL KNEE ARTHROPLASTY;  Surgeon: [name], MD;  Location: [facility];  Service: Orthopedics;  Laterality: Left;    No prescriptions prior to admission.   No Known Allergies  Social History  Substance Use Topics  . Smoking status: Former Smoker    Quit date: 11/16/1996  . Smokeless tobacco: Never Used  . Alcohol use No    Family History  Problem Relation Age of Onset  . Congestive Heart Failure Mother   . Diabetes Mother   . Thyroid disease Sister      Review of Systems  Constitutional: Negative.   HENT: Negative.   Eyes: Negative.   Respiratory: Negative.   Cardiovascular: Negative.   Gastrointestinal: Negative.   Genitourinary: Negative.   Musculoskeletal: Positive for joint pain.  Skin: Negative.   Neurological: Negative.   Psychiatric/Behavioral: Negative.     Objective:  Physical Exam  Constitutional: He is oriented to person, place, and time. No distress.  HENT:  Head: Normocephalic and atraumatic.  Eyes: EOM are normal. Pupils are equal, round, and reactive to light.  Neck: Normal range of motion.  Respiratory: Effort normal. No respiratory distress.  GI: He exhibits no distension.  Musculoskeletal: He exhibits tenderness.  Neurological: He is alert and oriented to person, place, and time.  Skin: Skin is warm and dry.  Psychiatric: He has a normal mood and affect.    Vital signs in last 24 hours:    Labs:   Estimated body mass index is 33.07 kg/m as calculated from the following:   Height as of  01/04/17: 6' (1.829 m).   Weight as of 01/04/17: 243 lb 12.8 oz (110.6 kg).   Imaging Review Plain radiographs demonstrate moderate degenerative joint disease of the right knee(s). The overall alignment ismild varus. The bone quality appears to be good for age and reported activity level.  Assessment/Plan:  End stage arthritis, right knee   The patient history, physical examination, clinical judgment of the provider and  imaging studies are consistent with end stage degenerative joint disease of the right knee(s) and total knee arthroplasty is deemed medically necessary. The treatment options including medical management, injection therapy arthroscopy and arthroplasty were discussed at length. The risks and benefits of total knee arthroplasty were presented and reviewed. The risks due to aseptic loosening, infection, stiffness, patella tracking problems, thromboembolic complications and other imponderables were discussed. The patient acknowledged the explanation, agreed to proceed with the plan and consent was signed. Patient is being admitted for inpatient treatment for surgery, pain control, PT, OT, prophylactic antibiotics, VTE prophylaxis, progressive ambulation and ADL's and discharge planning. The patient is planning to be discharged home with home health services

## 2017-01-13 ENCOUNTER — Inpatient Hospital Stay (HOSPITAL_COMMUNITY): Payer: Medicare HMO

## 2017-01-13 ENCOUNTER — Inpatient Hospital Stay (HOSPITAL_COMMUNITY): Payer: Medicare HMO | Admitting: Emergency Medicine

## 2017-01-13 ENCOUNTER — Encounter (HOSPITAL_COMMUNITY): Payer: Self-pay | Admitting: *Deleted

## 2017-01-13 ENCOUNTER — Inpatient Hospital Stay (HOSPITAL_COMMUNITY)
Admission: RE | Admit: 2017-01-13 | Discharge: 2017-01-15 | DRG: 470 | Disposition: A | Discharge status: Home Health | Payer: Medicare HMO | Source: Ambulatory Visit | Attending: Orthopaedic Surgery | Admitting: Orthopaedic Surgery

## 2017-01-13 ENCOUNTER — Encounter (HOSPITAL_COMMUNITY)
Admission: RE | Disposition: A | Discharge status: Home Health | Payer: Self-pay | Source: Ambulatory Visit | Attending: Orthopaedic Surgery

## 2017-01-13 ENCOUNTER — Inpatient Hospital Stay (HOSPITAL_COMMUNITY): Payer: Medicare HMO | Admitting: Certified Registered Nurse Anesthetist

## 2017-01-13 DIAGNOSIS — Z471 Aftercare following joint replacement surgery: Secondary | ICD-10-CM | POA: Diagnosis not present

## 2017-01-13 DIAGNOSIS — Z96652 Presence of left artificial knee joint: Secondary | ICD-10-CM | POA: Diagnosis not present

## 2017-01-13 DIAGNOSIS — M1711 Unilateral primary osteoarthritis, right knee: Secondary | ICD-10-CM | POA: Diagnosis not present

## 2017-01-13 DIAGNOSIS — Z96651 Presence of right artificial knee joint: Secondary | ICD-10-CM | POA: Diagnosis not present

## 2017-01-13 DIAGNOSIS — E039 Hypothyroidism, unspecified: Secondary | ICD-10-CM | POA: Diagnosis present

## 2017-01-13 DIAGNOSIS — R9389 Abnormal findings on diagnostic imaging of other specified body structures: Secondary | ICD-10-CM

## 2017-01-13 DIAGNOSIS — I10 Essential (primary) hypertension: Secondary | ICD-10-CM | POA: Diagnosis not present

## 2017-01-13 DIAGNOSIS — E785 Hyperlipidemia, unspecified: Secondary | ICD-10-CM | POA: Diagnosis not present

## 2017-01-13 DIAGNOSIS — Z09 Encounter for follow-up examination after completed treatment for conditions other than malignant neoplasm: Secondary | ICD-10-CM

## 2017-01-13 DIAGNOSIS — M25561 Pain in right knee: Secondary | ICD-10-CM | POA: Diagnosis present

## 2017-01-13 DIAGNOSIS — R918 Other nonspecific abnormal finding of lung field: Secondary | ICD-10-CM | POA: Diagnosis not present

## 2017-01-13 DIAGNOSIS — Z86718 Personal history of other venous thrombosis and embolism: Secondary | ICD-10-CM

## 2017-01-13 DIAGNOSIS — Z87442 Personal history of urinary calculi: Secondary | ICD-10-CM | POA: Diagnosis not present

## 2017-01-13 DIAGNOSIS — Z87891 Personal history of nicotine dependence: Secondary | ICD-10-CM

## 2017-01-13 DIAGNOSIS — Z8673 Personal history of transient ischemic attack (TIA), and cerebral infarction without residual deficits: Secondary | ICD-10-CM

## 2017-01-13 DIAGNOSIS — G8918 Other acute postprocedural pain: Secondary | ICD-10-CM | POA: Diagnosis not present

## 2017-01-13 DIAGNOSIS — M25661 Stiffness of right knee, not elsewhere classified: Secondary | ICD-10-CM

## 2017-01-13 DIAGNOSIS — R262 Difficulty in walking, not elsewhere classified: Secondary | ICD-10-CM

## 2017-01-13 HISTORY — PX: TOTAL KNEE ARTHROPLASTY: SHX125

## 2017-01-13 SURGERY — ARTHROPLASTY, KNEE, TOTAL
Anesthesia: General | Site: Knee | Laterality: Right

## 2017-01-13 MED ORDER — ONDANSETRON HCL 4 MG/2ML IJ SOLN
INTRAMUSCULAR | Status: DC | PRN
  Administered 2017-01-13: 4 mg via INTRAVENOUS

## 2017-01-13 MED ORDER — MIDAZOLAM HCL 5 MG/5ML IJ SOLN
INTRAMUSCULAR | Status: DC | PRN
  Administered 2017-01-13: 2 mg via INTRAVENOUS

## 2017-01-13 MED ORDER — DOCUSATE SODIUM 100 MG PO CAPS
100.0000 mg | ORAL_CAPSULE | Freq: Two times a day (BID) | ORAL | Status: DC
  Administered 2017-01-13 – 2017-01-15 (×5): 100 mg via ORAL
  Filled 2017-01-13 (×4): qty 1

## 2017-01-13 MED ORDER — DEXAMETHASONE SODIUM PHOSPHATE 10 MG/ML IJ SOLN
INTRAMUSCULAR | Status: AC
  Filled 2017-01-13: qty 1

## 2017-01-13 MED ORDER — HYDROMORPHONE HCL 1 MG/ML IJ SOLN
INTRAMUSCULAR | Status: AC
  Filled 2017-01-13: qty 0.5

## 2017-01-13 MED ORDER — DEXAMETHASONE SODIUM PHOSPHATE 10 MG/ML IJ SOLN
INTRAMUSCULAR | Status: DC | PRN
  Administered 2017-01-13: 10 mg via INTRAVENOUS

## 2017-01-13 MED ORDER — ONDANSETRON HCL 4 MG PO TABS: 4.0000 mg | ORAL_TABLET | Freq: Four times a day (QID) | ORAL | Status: DC | PRN

## 2017-01-13 MED ORDER — HYDROMORPHONE HCL 2 MG/ML IJ SOLN: 0.5000 mg | INTRAMUSCULAR | Status: DC | PRN

## 2017-01-13 MED ORDER — METOCLOPRAMIDE HCL 5 MG PO TABS: 5.0000 mg | ORAL_TABLET | Freq: Three times a day (TID) | ORAL | Status: DC | PRN

## 2017-01-13 MED ORDER — ACETAMINOPHEN 650 MG RE SUPP: 650.0000 mg | Freq: Four times a day (QID) | RECTAL | Status: DC | PRN

## 2017-01-13 MED ORDER — PHENYLEPHRINE 40 MCG/ML (10ML) SYRINGE FOR IV PUSH (FOR BLOOD PRESSURE SUPPORT)
PREFILLED_SYRINGE | INTRAVENOUS | Status: AC
  Filled 2017-01-13: qty 10

## 2017-01-13 MED ORDER — BUPIVACAINE HCL (PF) 0.25 % IJ SOLN
INTRAMUSCULAR | Status: AC
  Filled 2017-01-13: qty 30

## 2017-01-13 MED ORDER — ACETAMINOPHEN 325 MG PO TABS
650.0000 mg | ORAL_TABLET | Freq: Four times a day (QID) | ORAL | Status: DC | PRN
  Administered 2017-01-13 – 2017-01-15 (×6): 650 mg via ORAL
  Filled 2017-01-13 (×6): qty 2

## 2017-01-13 MED ORDER — SODIUM CHLORIDE 0.9 % IV SOLN
INTRAVENOUS | Status: DC
  Administered 2017-01-13: 13:00:00 via INTRAVENOUS

## 2017-01-13 MED ORDER — VITAMIN D 1000 UNITS PO TABS
3000.0000 [IU] | ORAL_TABLET | Freq: Every day | ORAL | Status: DC
  Administered 2017-01-14 – 2017-01-15 (×2): 3000 [IU] via ORAL
  Filled 2017-01-13 (×3): qty 3

## 2017-01-13 MED ORDER — METOCLOPRAMIDE HCL 5 MG/ML IJ SOLN: 5.0000 mg | Freq: Three times a day (TID) | INTRAMUSCULAR | Status: DC | PRN

## 2017-01-13 MED ORDER — FENTANYL CITRATE (PF) 100 MCG/2ML IJ SOLN
INTRAMUSCULAR | Status: AC
  Filled 2017-01-13: qty 4

## 2017-01-13 MED ORDER — CHLORHEXIDINE GLUCONATE 4 % EX LIQD: 60.0000 mL | Freq: Once | CUTANEOUS | Status: DC

## 2017-01-13 MED ORDER — PROPOFOL 10 MG/ML IV BOLUS
INTRAVENOUS | Status: AC
  Filled 2017-01-13: qty 20

## 2017-01-13 MED ORDER — APIXABAN 2.5 MG PO TABS
2.5000 mg | ORAL_TABLET | Freq: Two times a day (BID) | ORAL | Status: DC
  Administered 2017-01-13 – 2017-01-15 (×4): 2.5 mg via ORAL
  Filled 2017-01-13 (×4): qty 1

## 2017-01-13 MED ORDER — BUPIVACAINE HCL (PF) 0.25 % IJ SOLN
INTRAMUSCULAR | Status: DC | PRN
  Administered 2017-01-13: 20 mL

## 2017-01-13 MED ORDER — SODIUM CHLORIDE 0.9 % IR SOLN
Status: DC | PRN
  Administered 2017-01-13: 3000 mL

## 2017-01-13 MED ORDER — PROPOFOL 500 MG/50ML IV EMUL
INTRAVENOUS | Status: DC | PRN
  Administered 2017-01-13: 75 ug/kg/min via INTRAVENOUS

## 2017-01-13 MED ORDER — HYDROMORPHONE HCL 1 MG/ML IJ SOLN
0.2500 mg | INTRAMUSCULAR | Status: DC | PRN
  Administered 2017-01-13: 0.5 mg via INTRAVENOUS

## 2017-01-13 MED ORDER — ONDANSETRON HCL 4 MG/2ML IJ SOLN: 4.0000 mg | Freq: Four times a day (QID) | INTRAMUSCULAR | Status: DC | PRN

## 2017-01-13 MED ORDER — OXYCODONE HCL 5 MG PO TABS
5.0000 mg | ORAL_TABLET | ORAL | Status: DC | PRN
  Administered 2017-01-13: 10 mg via ORAL
  Filled 2017-01-13: qty 2

## 2017-01-13 MED ORDER — EPINEPHRINE PF 1 MG/ML IJ SOLN
INTRAMUSCULAR | Status: AC
  Filled 2017-01-13: qty 1

## 2017-01-13 MED ORDER — 0.9 % SODIUM CHLORIDE (POUR BTL) OPTIME
TOPICAL | Status: DC | PRN
  Administered 2017-01-13: 1000 mL

## 2017-01-13 MED ORDER — BUPIVACAINE LIPOSOME 1.3 % IJ SUSP
INTRAMUSCULAR | Status: DC | PRN
  Administered 2017-01-13: 20 mL

## 2017-01-13 MED ORDER — ONDANSETRON HCL 4 MG/2ML IJ SOLN
INTRAMUSCULAR | Status: AC
  Filled 2017-01-13: qty 2

## 2017-01-13 MED ORDER — BUPIVACAINE LIPOSOME 1.3 % IJ SUSP
20.0000 mL | INTRAMUSCULAR | Status: DC
  Filled 2017-01-13: qty 20

## 2017-01-13 MED ORDER — DEXTROSE 5 % IV SOLN
500.0000 mg | Freq: Four times a day (QID) | INTRAVENOUS | Status: DC | PRN
  Filled 2017-01-13: qty 5

## 2017-01-13 MED ORDER — METHOCARBAMOL 500 MG PO TABS
500.0000 mg | ORAL_TABLET | Freq: Four times a day (QID) | ORAL | Status: DC | PRN
  Administered 2017-01-13: 500 mg via ORAL
  Filled 2017-01-13: qty 1

## 2017-01-13 MED ORDER — LOSARTAN POTASSIUM 50 MG PO TABS
50.0000 mg | ORAL_TABLET | Freq: Every day | ORAL | Status: DC
  Filled 2017-01-13 (×2): qty 1

## 2017-01-13 MED ORDER — MIDAZOLAM HCL 2 MG/2ML IJ SOLN
INTRAMUSCULAR | Status: AC
  Filled 2017-01-13: qty 2

## 2017-01-13 MED ORDER — LACTATED RINGERS IV SOLN
INTRAVENOUS | Status: DC | PRN
  Administered 2017-01-13 (×2): via INTRAVENOUS

## 2017-01-13 MED ORDER — PHENOL 1.4 % MT LIQD: 1.0000 | OROMUCOSAL | Status: DC | PRN

## 2017-01-13 MED ORDER — POLYETHYLENE GLYCOL 3350 17 G PO PACK: 17.0000 g | PACK | Freq: Every day | ORAL | Status: DC | PRN

## 2017-01-13 MED ORDER — CEFAZOLIN SODIUM-DEXTROSE 2-4 GM/100ML-% IV SOLN
2.0000 g | INTRAVENOUS | Status: AC
  Administered 2017-01-13: 2 g via INTRAVENOUS
  Filled 2017-01-13: qty 100

## 2017-01-13 MED ORDER — FENTANYL CITRATE (PF) 100 MCG/2ML IJ SOLN
INTRAMUSCULAR | Status: DC | PRN
  Administered 2017-01-13 (×2): 50 ug via INTRAVENOUS

## 2017-01-13 MED ORDER — HYDROMORPHONE HCL 2 MG/ML IJ SOLN: 0.2500 mg | INTRAMUSCULAR | Status: DC | PRN

## 2017-01-13 MED ORDER — MENTHOL 3 MG MT LOZG: 1.0000 | LOZENGE | OROMUCOSAL | Status: DC | PRN

## 2017-01-13 MED ORDER — PROPOFOL 10 MG/ML IV BOLUS
INTRAVENOUS | Status: DC | PRN
  Administered 2017-01-13: 20 mg via INTRAVENOUS

## 2017-01-13 MED ORDER — PHENYLEPHRINE HCL 10 MG/ML IJ SOLN
INTRAVENOUS | Status: DC | PRN
  Administered 2017-01-13: 20 ug/min via INTRAVENOUS

## 2017-01-13 MED ORDER — LEVOTHYROXINE SODIUM 25 MCG PO TABS
137.0000 ug | ORAL_TABLET | Freq: Every day | ORAL | Status: DC
  Administered 2017-01-14 – 2017-01-15 (×2): 137 ug via ORAL
  Filled 2017-01-13 (×2): qty 1

## 2017-01-13 SURGICAL SUPPLY — 65 items
BANDAGE ACE 4X5 VEL STRL LF (GAUZE/BANDAGES/DRESSINGS) ×2 IMPLANT
BANDAGE ACE 6X5 VEL STRL LF (GAUZE/BANDAGES/DRESSINGS) ×2 IMPLANT
BANDAGE ESMARK 6X9 LF (GAUZE/BANDAGES/DRESSINGS) ×1 IMPLANT
BENZOIN TINCTURE PRP APPL 2/3 (GAUZE/BANDAGES/DRESSINGS) ×2 IMPLANT
BLADE SAGITTAL 25.0X1.19X90 (BLADE) ×2 IMPLANT
BLADE SAW SGTL 13X75X1.27 (BLADE) ×2 IMPLANT
BNDG CMPR 9X6 STRL LF SNTH (GAUZE/BANDAGES/DRESSINGS) ×1
BNDG CMPR MED 10X6 ELC LF (GAUZE/BANDAGES/DRESSINGS) ×1
BNDG ELASTIC 6X10 VLCR STRL LF (GAUZE/BANDAGES/DRESSINGS) ×2 IMPLANT
BNDG ESMARK 6X9 LF (GAUZE/BANDAGES/DRESSINGS) ×2
BOWL SMART MIX CTS (DISPOSABLE) ×2 IMPLANT
CAPT KNEE TOTAL 3 ATTUNE ×2 IMPLANT
CEMENT HV SMART SET (Cement) ×2 IMPLANT
COVER SURGICAL LIGHT HANDLE (MISCELLANEOUS) ×2 IMPLANT
CUFF TOURNIQUET SINGLE 34IN LL (TOURNIQUET CUFF) ×2 IMPLANT
CUFF TOURNIQUET SINGLE 44IN (TOURNIQUET CUFF) IMPLANT
DRAPE ORTHO SPLIT 77X108 STRL (DRAPES) ×4
DRAPE SURG ORHT 6 SPLT 77X108 (DRAPES) ×2 IMPLANT
DRAPE U-SHAPE 47X51 STRL (DRAPES) ×2 IMPLANT
DRSG PAD ABDOMINAL 8X10 ST (GAUZE/BANDAGES/DRESSINGS) ×2 IMPLANT
DURAPREP 26ML APPLICATOR (WOUND CARE) ×2 IMPLANT
ELECT REM PT RETURN 9FT ADLT (ELECTROSURGICAL) ×2
ELECTRODE REM PT RTRN 9FT ADLT (ELECTROSURGICAL) ×1 IMPLANT
EVACUATOR 1/8 PVC DRAIN (DRAIN) IMPLANT
FACESHIELD WRAPAROUND (MASK) ×2 IMPLANT
GAUZE SPONGE 4X4 12PLY STRL (GAUZE/BANDAGES/DRESSINGS) ×2 IMPLANT
GAUZE XEROFORM 5X9 LF (GAUZE/BANDAGES/DRESSINGS) ×2 IMPLANT
GLOVE BIOGEL PI IND STRL 8 (GLOVE) ×2 IMPLANT
GLOVE BIOGEL PI INDICATOR 8 (GLOVE) ×2
GLOVE ORTHO TXT STRL SZ7.5 (GLOVE) ×4 IMPLANT
GOWN STRL REUS W/ TWL LRG LVL3 (GOWN DISPOSABLE) ×1 IMPLANT
GOWN STRL REUS W/ TWL XL LVL3 (GOWN DISPOSABLE) ×1 IMPLANT
GOWN STRL REUS W/TWL 2XL LVL3 (GOWN DISPOSABLE) ×2 IMPLANT
GOWN STRL REUS W/TWL LRG LVL3 (GOWN DISPOSABLE) ×2
GOWN STRL REUS W/TWL XL LVL3 (GOWN DISPOSABLE) ×2
HANDPIECE INTERPULSE COAX TIP (DISPOSABLE) ×2
IMMOBILIZER KNEE 22 UNIV (SOFTGOODS) ×2 IMPLANT
KIT BASIN OR (CUSTOM PROCEDURE TRAY) ×2 IMPLANT
KIT ROOM TURNOVER OR (KITS) ×2 IMPLANT
MANIFOLD NEPTUNE II (INSTRUMENTS) ×2 IMPLANT
MARKER SKIN DUAL TIP RULER LAB (MISCELLANEOUS) ×2 IMPLANT
NEEDLE HYPO 25GX1X1/2 BEV (NEEDLE) ×2 IMPLANT
NS IRRIG 1000ML POUR BTL (IV SOLUTION) ×2 IMPLANT
PACK TOTAL JOINT (CUSTOM PROCEDURE TRAY) ×2 IMPLANT
PAD ARMBOARD 7.5X6 YLW CONV (MISCELLANEOUS) ×4 IMPLANT
PAD CAST 4YDX4 CTTN HI CHSV (CAST SUPPLIES) ×1 IMPLANT
PADDING CAST COTTON 4X4 STRL (CAST SUPPLIES) ×2
PADDING CAST COTTON 6X4 STRL (CAST SUPPLIES) ×2 IMPLANT
SET HNDPC FAN SPRY TIP SCT (DISPOSABLE) ×1 IMPLANT
SPONGE GAUZE 4X4 12PLY STER LF (GAUZE/BANDAGES/DRESSINGS) ×2 IMPLANT
STAPLER VISISTAT 35W (STAPLE) ×2 IMPLANT
STRIP CLOSURE SKIN 1/2X4 (GAUZE/BANDAGES/DRESSINGS) ×4 IMPLANT
SUCTION FRAZIER HANDLE 10FR (MISCELLANEOUS) ×1
SUCTION TUBE FRAZIER 10FR DISP (MISCELLANEOUS) ×1 IMPLANT
SUT VIC AB 0 CT1 27 (SUTURE) ×2
SUT VIC AB 0 CT1 27XBRD ANBCTR (SUTURE) ×1 IMPLANT
SUT VIC AB 1 CTX 36 (SUTURE) ×4
SUT VIC AB 1 CTX36XBRD ANBCTR (SUTURE) ×2 IMPLANT
SUT VIC AB 2-0 CT1 27 (SUTURE) ×4
SUT VIC AB 2-0 CT1 TAPERPNT 27 (SUTURE) ×2 IMPLANT
SUT VIC AB 3-0 X1 27 (SUTURE) ×2 IMPLANT
SYR CONTROL 10ML LL (SYRINGE) ×2 IMPLANT
TOWEL OR 17X24 6PK STRL BLUE (TOWEL DISPOSABLE) ×2 IMPLANT
TOWEL OR 17X26 10 PK STRL BLUE (TOWEL DISPOSABLE) ×2 IMPLANT
TRAY CATH 16FR W/PLASTIC CATH (SET/KITS/TRAYS/PACK) IMPLANT

## 2017-01-13 NOTE — Anesthesia Procedure Notes (Signed)
Spinal  Patient location during procedure: OR Start time: 01/13/2017 7:40 AM End time: 01/13/2017 7:55 AM Reason for block: at surgeon's request Staffing Anesthesiologist: Antwuan Eckley Performed: anesthesiologist  Preanesthetic Checklist Completed: site marked, surgical consent, pre-op evaluation, timeout performed, IV checked, risks and benefits discussed and monitors and equipment checked Spinal Block Patient position: sitting Prep: DuraPrep Patient monitoring: heart rate and cardiac monitor Approach: midline Location: L3-4 Injection technique: single-shot Needle Needle type: Quincke  Needle gauge: 22 G Assessment Sensory level: T10 Additional Notes Clear CSF 13mg  spinal marcaine  Pt tolerated procedure well

## 2017-01-13 NOTE — Evaluation (Signed)
Physical Therapy Evaluation Patient Details Name: Christian Weeks MRN: [id number] DOB: May 22, 1951 Today's Date: 01/13/2017   History of Present Illness  66 yo male admitted on 01/13/17 for right TKA. 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   Clinical Impression  Pt is POD 0 and limited due to medical complications this session. Pt has increased drainage in his bandage and RN requests PT hold off on OOB mobility until tomorrow. Pt was assisted out of CPM, KI was applied with ice and initiated ankle pumps in HEP. Prior to admission, pt lived alone in single level home with 3 steps. Pt was completely independent including driving and working as a Building control surveyor. Pt lost his wife in April of last year and is fairly newly widowed. Pt will have assistance from his children who live nearby upon discharge. Pt will benefit from HHPT upon discharge to assist with maximizing his functional outcomes. Pt will require additional assessment of mobility next session. Pt continues to benefit from acute pt services to assist with return home.     Follow Up Recommendations Home health PT;Supervision/Assistance - 24 hour    Equipment Recommendations  None recommended by PT;Other (comment) (pt already has all equipment in place )    Recommendations for Other Services       Precautions / Restrictions Precautions Precautions: Knee Precaution Booklet Issued: No Precaution Comments: To review next session Required Braces or Orthoses: Knee Immobilizer - Right Knee Immobilizer - Right: On except when in CPM Restrictions Weight Bearing Restrictions: Yes RLE Weight Bearing: Weight bearing as tolerated      Mobility  Bed Mobility               General bed mobility comments: Unable to fully assess. Pt is, however, able to sit up in bed without assistance. Unable to move EOB or OOB due to increased drainage in right knee. RN requests PT hold off until tomrorow to  allow bleeding to stop prior to moving pt OOB.   Transfers                 General transfer comment: Unable to assess.   Ambulation/Gait             General Gait Details: Unable to assess  Stairs            Wheelchair Mobility    Modified Rankin (Stroke Patients Only)       Balance                                             Pertinent Vitals/Pain Pain Assessment: 0-10 Pain Score: 3  Pain Location: right knee Pain Descriptors / Indicators: Throbbing;Discomfort Pain Intervention(s): Limited activity within patient's tolerance;Monitored during session;Premedicated before session;Repositioned;Ice applied    Home Living Family/patient expects to be discharged to:: Private residence Living Arrangements: Alone Available Help at Discharge: Family;Available 24 hours/day;Other (Comment) (family lives close by) Type of Home: House Home Access: Stairs to enter Entrance Stairs-Rails: Right Entrance Stairs-Number of Steps: 3 Home Layout: One level Home Equipment: Walker - 2 wheels;Bedside commode;Cane - single point;Grab bars - tub/shower      Prior Function Level of Independence: Independent         Comments: was completely independent, working as a Barista   Dominant Hand: Left  Extremity/Trunk Assessment   Upper Extremity Assessment Upper Extremity Assessment: Defer to OT evaluation    Lower Extremity Assessment Lower Extremity Assessment: LLE deficits/detail LLE Deficits / Details: pt with normal post op pain and weakness. At least 3/5 ankle and 2/5 knee and hip per gross functional assessment.        Communication   Communication: No difficulties  Cognition Arousal/Alertness: Awake/alert Behavior During Therapy: WFL for tasks assessed/performed Overall Cognitive Status: Within Functional Limits for tasks assessed                      General Comments General comments (skin integrity,  edema, etc.): Pt has increased drainage on bandage. RN notified and assessed. Requested PT hold until tomorrow for OOB mobility. Removed CPM and made pt comfortable in bed. Reviewed ankle pumps in HEP. Applied immobilizer and applied ice.      Exercises Total Joint Exercises Ankle Circles/Pumps: AROM;Both;20 reps;Supine   Assessment/Plan    PT Assessment Patient needs continued PT services  PT Problem List Decreased strength;Decreased range of motion;Decreased activity tolerance;Decreased balance;Decreased mobility;Decreased coordination;Pain          PT Treatment Interventions DME instruction;Gait training;Stair training;Functional mobility training;Therapeutic activities;Therapeutic exercise;Balance training;Patient/family education    PT Goals (Current goals can be found in the Care Plan section)  Acute Rehab PT Goals Patient Stated Goal: to go home PT Goal Formulation: With patient Time For Goal Achievement: 01/20/17 Potential to Achieve Goals: Good    Frequency 7X/week   Barriers to discharge        Co-evaluation               End of Session Equipment Utilized During Treatment: Left knee immobilizer Activity Tolerance: Treatment limited secondary to medical complications (Comment) (Increased drainage in Right knee. RN requested PT hold ) Patient left: in bed;with call bell/phone within reach;with SCD's reapplied Nurse Communication: Other (comment) (increased drainage)         Time: [id number] PT Time Calculation (min) (ACUTE ONLY): 32 min   Charges:   PT Evaluation $PT Eval Moderate Complexity: 1 Procedure PT Treatments $Therapeutic Activity: 8-22 mins   PT G Codes:        Scheryl Marten PT, DPT  [phone]  01/13/2017, 5:01 PM

## 2017-01-13 NOTE — Anesthesia Procedure Notes (Signed)
Anesthesia Regional Block:  Adductor canal blockAdductor canal block Narrative:

## 2017-01-13 NOTE — Brief Op Note (Signed)
01/13/2017  10:22 AM  PATIENT:  Christian Weeks  66 y.o. male  PRE-OPERATIVE DIAGNOSIS:  Right Knee Osteoarthritis  POST-OPERATIVE DIAGNOSIS:  Right Knee Osteoarthritis  PROCEDURE:  Procedure(s): RIGHT TOTAL KNEE ARTHROPLASTY (Right)  SURGEON:  Surgeon(s) and Role:    * [name], MD - Primary  PHYSICIAN ASSISTANT: Benjiman Core     ANESTHESIA:   spinal  EBL:  Total I/O In: 1200 [I.V.:1200] Out: 25 [Blood:25]  BLOOD ADMINISTERED:none  DRAINS: none   LOCAL MEDICATIONS USED:  MARCAINE/exparel     SPECIMEN:  No Specimen  DISPOSITION OF SPECIMEN:  N/A  COUNTS:  YES  TOURNIQUET:   Total Tourniquet Time Documented: Thigh (Right) - 68 minutes Total: Thigh (Right) - 68 minutes   DICTATION: .Viviann Spare Dictation  PLAN OF CARE: Admit to inpatient   PATIENT DISPOSITION:  PACU - hemodynamically stable.

## 2017-01-13 NOTE — Anesthesia Postprocedure Evaluation (Addendum)
Anesthesia Post Note  Patient: Christian Weeks  Procedure(s) Performed: Procedure(s) (LRB): RIGHT TOTAL KNEE ARTHROPLASTY (Right)  Patient location during evaluation: PACU Anesthesia Type: Spinal Level of consciousness: awake Pain management: pain level controlled Vital Signs Assessment: post-procedure vital signs reviewed and stable Respiratory status: spontaneous breathing Cardiovascular status: stable Anesthetic complications: no       Last Vitals:  Vitals:   01/13/17 1134 01/13/17 1155  BP: 112/66 107/71  Pulse: (!) 55 (!) 56  Resp: 20 18  Temp: 36.6 C 36.5 C    Last Pain:  Vitals:   01/13/17 1155  TempSrc: Oral  PainSc:                  Jenni Thew

## 2017-01-13 NOTE — Transfer of Care (Signed)
Immediate Anesthesia Transfer of Care Note  Patient: Christian Weeks  Procedure(s) Performed: Procedure(s): RIGHT TOTAL KNEE ARTHROPLASTY (Right)  Patient Location: PACU  Anesthesia Type:Spinal and MAC combined with regional for post-op pain  Level of Consciousness: awake, alert  and oriented  Airway & Oxygen Therapy: Patient Spontanous Breathing and Patient connected to nasal cannula oxygen  Post-op Assessment: Report given to RN and Post -op Vital signs reviewed and stable  Post vital signs: Reviewed and stable  Last Vitals:  Vitals:   01/13/17 0636  BP: 115/88  Pulse: (!) 58  Resp: 18  Temp: 36.8 C    Last Pain:  Vitals:   01/13/17 0636  TempSrc: Oral      Patients Stated Pain Goal: 5 (21/74/71 5953)  Complications: No apparent anesthesia complications

## 2017-01-13 NOTE — Op Note (Signed)
Preop diagnosis: Right primary knee osteoarthritis  Postop diagnosis: Same  Procedure: Right cemented total knee arthroplasty  Surgeon: Rodell Perna M.D.  Asst. Benjiman Core PA-C medically necessary and present for the entire procedure   Anesthesia: Spinal plus Marcaine and Expiril  Tourniquet time 66 minutes 350  Implants:Attune #8 femur #9 tibia rotating platform 5 mm. 41 mm 3 peg patella.  Procedure: After standard prepping draping preoperative antibiotics timeout procedure proximal thigh tourniquet DuraPrep including foot standard total knee sheets drapes sterile skin marker Betadine Steri-Drape was used. Leg was wrapped an Esmarch tourniquet inflated.  Midline incision was made. Medial parapatellar incision. Patella was everted 10 mm resected. The patient had a large the knee joint and intramedullary holes placed in the femur trials sized for an 8 distal cuts were made. On the tibial side resection was made using the 3 mm stylus. Block was tight so we went back and take additional 2 mm off the femur also 2 mm off the tibia. This allowed the #5 fit nicely. He was tighter medially with bone-on-bone changes in part of the deep MCL was stripped off the tibial side. Spurs removed off of both sides of the femur and tibia chamfer cuts made on the femur keel preparation on the tibia. Pulse lavage resection of all meniscal remnants anterior cruciate ligament PCL. Trials were inserted the full extension good collateral balance. Pulse lavage vacuum mixing of the cement cementing the tibia followed by femur insertion of the five  Millimeter Attune rotating platform. Patella clamp was held. All excess cement was removed. Once the cement was hard at 15 minutes tourniquet was used deflated hemostasis was obtained in standard layered closure postop dressing and transferred recovery room. Instrument count needle count was correct.

## 2017-01-13 NOTE — Discharge Instructions (Signed)
Information on my medicine - ELIQUIS® (apixaban) ° °This medication education was reviewed with me or my healthcare representative as part of my discharge preparation.  The pharmacist that spoke with me during my hospital stay was:  Josselyne Onofrio Brown, RPH ° °Why was Eliquis® prescribed for you? °Eliquis® was prescribed for you to reduce the risk of blood clots forming after orthopedic surgery.   ° °What do You need to know about Eliquis®? °Take your Eliquis® TWICE DAILY - one tablet in the morning and one tablet in the evening with or without food.  It would be best to take the dose about the same time each day. ° °If you have difficulty swallowing the tablet whole please discuss with your pharmacist how to take the medication safely. ° °Take Eliquis® exactly as prescribed by your doctor and DO NOT stop taking Eliquis® without talking to the doctor who prescribed the medication.  Stopping without other medication to take the place of Eliquis® may increase your risk of developing a clot. ° °After discharge, you should have regular check-up appointments with your healthcare provider that is prescribing your Eliquis®. ° °What do you do if you miss a dose? °If a dose of ELIQUIS® is not taken at the scheduled time, take it as soon as possible on the same day and twice-daily administration should be resumed.  The dose should not be doubled to make up for a missed dose.  Do not take more than one tablet of ELIQUIS at the same time. ° °Important Safety Information °A possible side effect of Eliquis® is bleeding. You should call your healthcare provider right away if you experience any of the following: °? Bleeding from an injury or your nose that does not stop. °? Unusual colored urine (red or dark brown) or unusual colored stools (red or black). °? Unusual bruising for unknown reasons. °? A serious fall or if you hit your head (even if there is no bleeding). ° °Some medicines may interact with Eliquis® and might  increase your risk of bleeding or clotting while on Eliquis®. To help avoid this, consult your healthcare provider or pharmacist prior to using any new prescription or non-prescription medications, including herbals, vitamins, non-steroidal anti-inflammatory drugs (NSAIDs) and supplements. ° °This website has more information on Eliquis® (apixaban): http://www.eliquis.com/eliquis/home ° °

## 2017-01-13 NOTE — Anesthesia Preprocedure Evaluation (Addendum)
Anesthesia Evaluation  Patient identified by MRN, date of birth, ID band Patient awake    Reviewed: Allergy & Precautions, NPO status   Airway Mallampati: II  TM Distance: >3 FB     Dental   Pulmonary former smoker,    breath sounds clear to auscultation       Cardiovascular hypertension, + Peripheral Vascular Disease   Rhythm:Regular Rate:Normal     Neuro/Psych TIA   GI/Hepatic negative GI ROS, Neg liver ROS,   Endo/Other  Hypothyroidism   Renal/GU      Musculoskeletal   Abdominal   Peds  Hematology   Anesthesia Other Findings   Reproductive/Obstetrics                             Anesthesia Physical Anesthesia Plan  ASA: III  Anesthesia Plan: Spinal   Post-op Pain Management:  Regional for Post-op pain   Induction: Intravenous  Airway Management Planned: Simple Face Mask  Additional Equipment:   Intra-op Plan:   Post-operative Plan: Extubation in OR  Informed Consent: I have reviewed the patients History and Physical, chart, labs and discussed the procedure including the risks, benefits and alternatives for the proposed anesthesia with the patient or authorized representative who has indicated his/her understanding and acceptance.   Dental advisory given  Plan Discussed with:   Anesthesia Plan Comments:        Anesthesia Quick Evaluation

## 2017-01-13 NOTE — Anesthesia Procedure Notes (Signed)
Anesthesia Regional Block:  Adductor canal block  Pre-Anesthetic Checklist: ,, timeout performed, Correct Patient, Correct Site, Correct Laterality, Correct Procedure, Correct Position, risks and benefits discussed, surgical consent, pre-op evaluation,  At surgeon's request and post-op pain management  Laterality: Right  Prep: Dura Prep       Needles:   Needle Type: Stimulator Needle - 80          Additional Needles:  Procedures: Doppler guided, ultrasound guided (picture in chart) and nerve stimulator Adductor canal block Narrative:  Start time: 01/13/2017 7:05 AM End time: 01/13/2017 7:20 AM Injection made incrementally with aspirations every 5 mL.  Performed by: Personally  Anesthesiologist: Belinda Block

## 2017-01-14 ENCOUNTER — Inpatient Hospital Stay (HOSPITAL_COMMUNITY): Payer: Medicare HMO

## 2017-01-14 LAB — CBC
HEMATOCRIT: 37.7 % — AB (ref 39.0–52.0)
Hemoglobin: 12.7 g/dL — ABNORMAL LOW (ref 13.0–17.0)
MCH: 29.7 pg (ref 26.0–34.0)
MCHC: 33.7 g/dL (ref 30.0–36.0)
MCV: 88.3 fL (ref 78.0–100.0)
PLATELETS: 182 10*3/uL (ref 150–400)
RBC: 4.27 MIL/uL (ref 4.22–5.81)
RDW: 13.4 % (ref 11.5–15.5)
WBC: 16.1 10*3/uL — AB (ref 4.0–10.5)

## 2017-01-14 LAB — BASIC METABOLIC PANEL
ANION GAP: 7 (ref 5–15)
BUN: 12 mg/dL (ref 6–20)
CALCIUM: 8.9 mg/dL (ref 8.9–10.3)
CO2: 25 mmol/L (ref 22–32)
Chloride: 105 mmol/L (ref 101–111)
Creatinine, Ser: 1.2 mg/dL (ref 0.61–1.24)
Glucose, Bld: 126 mg/dL — ABNORMAL HIGH (ref 65–99)
POTASSIUM: 4.5 mmol/L (ref 3.5–5.1)
Sodium: 137 mmol/L (ref 135–145)

## 2017-01-14 MED ORDER — IOPAMIDOL (ISOVUE-300) INJECTION 61%
INTRAVENOUS | Status: AC
  Administered 2017-01-14: 75 mL
  Filled 2017-01-14: qty 75

## 2017-01-14 NOTE — Progress Notes (Signed)
Physical Therapy Treatment Patient Details Name: Christian Weeks MRN: [id number] DOB: 1951/08/21 Today's Date: 01/14/2017    History of Present Illness 66 yo male admitted on 01/13/17 for right TKA. 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     PT Comments    Pt is POD 1 and moving very well with therapy. Pt has limited pain and has not taken any medications since yesterday afternoon. Performed gait assessment, bed mobs and transfers with Min guard for majority and improved to supervision toward end of session. Pt is making steady progress toward goals. Pt will need to perform stair negotiation prior to discharge and may be ready next session.    Follow Up Recommendations  Home health PT;Supervision/Assistance - 24 hour     Equipment Recommendations  None recommended by PT    Recommendations for Other Services       Precautions / Restrictions Precautions Precautions: Knee Precaution Booklet Issued: Yes (comment) Precaution Comments: reviewed no pillow under knee  Required Braces or Orthoses: Knee Immobilizer - Right Knee Immobilizer - Right: On except when in CPM Restrictions Weight Bearing Restrictions: Yes RLE Weight Bearing: Weight bearing as tolerated    Mobility  Bed Mobility Overal bed mobility: Needs Assistance Bed Mobility: Supine to Sit     Supine to sit: Modified independent (Device/Increase time)     General bed mobility comments: pt is able to sit EOb without assistance and without HOB elevated. Minimal use of railings  Transfers Overall transfer level: Needs assistance Equipment used: Rolling walker (2 wheeled) Transfers: Sit to/from Stand Sit to Stand: Min guard         General transfer comment: Min guard for safety, mostly Mod I  Ambulation/Gait Ambulation/Gait assistance: Min guard Ambulation Distance (Feet): 250 Feet Assistive device: Rolling walker (2 wheeled) Gait Pattern/deviations:  Step-through pattern;Decreased step length - left;Decreased stance time - right;Antalgic Gait velocity: decreased Gait velocity interpretation: Below normal speed for age/gender General Gait Details: Mild antalgic gait. Minimal cueing for sequencing. Improved cadence as gait distance increases.    Stairs            Wheelchair Mobility    Modified Rankin (Stroke Patients Only)       Balance Overall balance assessment: Needs assistance Sitting-balance support: No upper extremity supported;Feet supported Sitting balance-Leahy Scale: Good Sitting balance - Comments: EOB with no back support   Standing balance support: No upper extremity supported Standing balance-Leahy Scale: Fair Standing balance comment: Able to stand statically briefly without assistance.                     Cognition Arousal/Alertness: Awake/alert Behavior During Therapy: WFL for tasks assessed/performed Overall Cognitive Status: Within Functional Limits for tasks assessed                      Exercises Total Joint Exercises Ankle Circles/Pumps: AROM;Both;20 reps;Supine Quad Sets: AROM;Right;10 reps;Supine Heel Slides: AROM;Right;10 reps;Supine Hip ABduction/ADduction: AROM;Right;10 reps;Supine Straight Leg Raises: AROM;Right;10 reps;Supine Goniometric ROM: 5-94 (extension limited by bandage)    General Comments        Pertinent Vitals/Pain Pain Assessment: 0-10 Pain Score: 3  Pain Location: right knee following exercise. 0/10 before exercise Pain Descriptors / Indicators: Aching;Discomfort Pain Intervention(s): Monitored during session;Ice applied    Home Living                      Prior Function  PT Goals (current goals can now be found in the care plan section) Acute Rehab PT Goals Patient Stated Goal: to go home Progress towards PT goals: Progressing toward goals    Frequency    7X/week      PT Plan Current plan remains appropriate     Co-evaluation             End of Session Equipment Utilized During Treatment: Gait belt;Right knee immobilizer Activity Tolerance: Patient tolerated treatment well Patient left: in chair;with call bell/phone within reach     Time: 0743-0815 PT Time Calculation (min) (ACUTE ONLY): 32 min  Charges:  $Gait Training: 8-22 mins $Therapeutic Exercise: 8-22 mins                    G Codes:      Scheryl Marten PT, DPT  [phone]  01/14/2017, 8:21 AM

## 2017-01-14 NOTE — Care Management Note (Signed)
Case Management Note  Patient Details  Name: Christian Weeks MRN: [id number] Date of Birth: [birth year]  Subjective/Objective:  67 yr old gentleman s/p right total knee arthroplasty.                  Action/Plan: Case manager spoke with patient concerning [facility] and DME needs. Choice was offered for [facility], CM called referral to Melene Muller, [place] Liaison. Patient states he has rolling walker and 3in1 from previous surgery. He will have family support at discharge.    Expected Discharge Date:  01/15/17               Expected Discharge Plan:  [place]  In-House Referral:  NA  Discharge planning Services  CM Consult  Post Acute Care Choice:  Home Health Choice offered to:  Patient  DME Arranged:  N/A DME Agency:  NA  HH Arranged:  PT [place] Agency:  [facility]  Status of Service:  Completed, signed off  If discussed at [place] of Stay Meetings, dates discussed:    Additional Comments:  Ninfa Meeker, RN 01/14/2017, 11:06 AM

## 2017-01-14 NOTE — Progress Notes (Signed)
   Subjective:  Patient reports pain as moderate.    Objective:   VITALS:   Vitals:   01/13/17 1954 01/14/17 0032 01/14/17 0457 01/14/17 1053  BP: 98/70 106/72 98/64 105/79  Pulse: 72 67 62 60  Resp: 18 18 18    Temp: 98.3 F (36.8 C) 97.8 F (36.6 C) 97.8 F (36.6 C)   TempSrc: Oral Oral Oral   SpO2: 95% 94% 94% 97%    Neurologically intact Neurovascular intact Sensation intact distally Intact pulses distally Dorsiflexion/Plantar flexion intact Incision: dressing C/D/I and no drainage No cellulitis present Compartment soft   Lab Results  Component Value Date   WBC 16.1 (H) 01/14/2017   HGB 12.7 (L) 01/14/2017   HCT 37.7 (L) 01/14/2017   MCV 88.3 01/14/2017   PLT 182 01/14/2017     Assessment/Plan:  1 Day Post-Op   - Expected postop acute blood loss anemia - will monitor for symptoms - Up with PT/OT - DVT ppx - SCDs, ambulation, eliquis - WBAT operative extremity - Pain control - Discharge planning - home sunday  Eduard Roux 01/14/2017, 12:22 PM [phone]

## 2017-01-14 NOTE — Progress Notes (Signed)
Physical Therapy Treatment Patient Details Name: KAELYN YOUNGDAHL MRN: [id number] DOB: 04-05-1951 Today's Date: 01/14/2017    History of Present Illness 66 yo male admitted on 01/13/17 for right TKA. 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     PT Comments    Patient is progressing well with mobility and tolerated increased gait distance and stair training this session. Current plan remains appropriate.   Follow Up Recommendations  Home health PT;Supervision/Assistance - 24 hour     Equipment Recommendations  None recommended by PT    Recommendations for Other Services       Precautions / Restrictions Precautions Precautions: Knee Precaution Booklet Issued: Yes (comment) Precaution Comments: reviewed no pillow under knee  Required Braces or Orthoses: Knee Immobilizer - Right Knee Immobilizer - Right: On except when in CPM Restrictions Weight Bearing Restrictions: Yes RLE Weight Bearing: Weight bearing as tolerated    Mobility  Bed Mobility Overal bed mobility: Modified Independent Bed Mobility: Supine to Sit              Transfers Overall transfer level: Needs assistance Equipment used: Rolling walker (2 wheeled) Transfers: Sit to/from Stand Sit to Stand: Supervision         General transfer comment: safe hand placement; supervision for safety  Ambulation/Gait Ambulation/Gait assistance: Supervision Ambulation Distance (Feet): 350 Feet Assistive device: Rolling walker (2 wheeled) Gait Pattern/deviations: Step-through pattern;Decreased step length - left;Decreased stance time - right;Decreased weight shift to right Gait velocity: decreased   General Gait Details: pt with steady gait and good step through pattern; cues for R knee extension during stance phase and posture   Stairs Stairs: Yes   Stair Management: One rail Right;Step to pattern;Forwards Number of Stairs: 10 General stair comments: cues  for sequencing and technique  Wheelchair Mobility    Modified Rankin (Stroke Patients Only)       Balance Overall balance assessment: Needs assistance Sitting-balance support: No upper extremity supported;Feet supported Sitting balance-Leahy Scale: Good     Standing balance support: No upper extremity supported Standing balance-Leahy Scale: Fair Standing balance comment: able to static stand without support                    Cognition Arousal/Alertness: Awake/alert Behavior During Therapy: WFL for tasks assessed/performed Overall Cognitive Status: Within Functional Limits for tasks assessed                      Exercises Total Joint Exercises Long Arc Quad: AROM;Right;10 reps    General Comments        Pertinent Vitals/Pain Pain Assessment: 0-10 Pain Score: 3  Pain Location: R knee while ambulating Pain Descriptors / Indicators: Sore Pain Intervention(s): Limited activity within patient's tolerance;Monitored during session;Premedicated before session;Repositioned    Home Living                      Prior Function            PT Goals (current goals can now be found in the care plan section) Acute Rehab PT Goals Patient Stated Goal: to go home Progress towards PT goals: Progressing toward goals    Frequency    7X/week      PT Plan Current plan remains appropriate    Co-evaluation             End of Session Equipment Utilized During Treatment: Gait belt Activity Tolerance: Patient tolerated  treatment well Patient left: in chair;with call bell/phone within reach;Other (comment) (R LE in zero degree foam)     Time: [id number] PT Time Calculation (min) (ACUTE ONLY): 20 min  Charges:  $Gait Training: 8-22 mins                    G Codes:      Salina April, PTA Pager: [phone]   01/14/2017, 1:59 PM

## 2017-01-14 NOTE — Evaluation (Addendum)
Occupational Therapy Evaluation/Discharge Patient Details Name: Christian Weeks MRN: [id number] DOB: 01/04/1951 Today's Date: 01/14/2017    History of Present Illness 66 yo male admitted on 01/13/17 for right TKA. 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    Clinical Impression   PTA, pt was independent with ADL and functional mobility. He underwent L TKA approximately 1 year ago and has all DME needs met. Pt currently requires min guard assist for tub transfer and supervision for all other ADL. All education complete with pt and family concerning safe ADL post-operatively, fall prevention, energy conservation, knee precautions, and use of ice for pain management. Pt's son present and engaged in session and demonstrates ability to provide necessary assistance. Pt/family demonstrate understanding of all education and report no further questions/concerns. Acute OT will sign off. No OT follow-up recommended.     Follow Up Recommendations  No OT follow up;Supervision/Assistance - 24 hour (Initially)    Equipment Recommendations  None recommended by OT (Has DME needs met)    Recommendations for Other Services       Precautions / Restrictions Precautions Precautions: Knee Precaution Booklet Issued: Yes (comment) Precaution Comments: reviewed no pillow under knee and knee precautions during ADL Required Braces or Orthoses: Knee Immobilizer - Right Knee Immobilizer - Right: On except when in CPM Restrictions Weight Bearing Restrictions: Yes RLE Weight Bearing: Weight bearing as tolerated      Mobility Bed Mobility Overal bed mobility: Modified Independent Bed Mobility: Supine to Sit     Supine to sit: Modified independent (Device/Increase time)     General bed mobility comments: Increased time and minimal use of bed rails  Transfers Overall transfer level: Needs assistance Equipment used: Rolling walker (2 wheeled) Transfers:  Sit to/from Stand Sit to Stand: Supervision         General transfer comment: safe hand placement; supervision for safety    Balance Overall balance assessment: Needs assistance Sitting-balance support: No upper extremity supported;Feet supported Sitting balance-Leahy Scale: Good Sitting balance - Comments: EOB with no back support   Standing balance support: No upper extremity supported;During functional activity Standing balance-Leahy Scale: Fair Standing balance comment: able to static stand without support for UB dressing tasks                            ADL Overall ADL's : Needs assistance/impaired     Grooming: Supervision/safety;Standing   Upper Body Bathing: Set up;Sitting   Lower Body Bathing: Supervison/ safety;Sit to/from stand   Upper Body Dressing : Standing;Supervision/safety   Lower Body Dressing: Supervision/safety;Sit to/from stand   Toilet Transfer: Supervision/safety;Ambulation;BSC;RW   Toileting- Clothing Manipulation and Hygiene: Supervision/safety;Sit to/from stand   Tub/ Shower Transfer: Min guard;Ambulation;Rolling walker;Shower seat;Tub transfer   Functional mobility during ADLs: Supervision/safety;Rolling walker General ADL Comments: Pt and son educated on compensatory ADL strategies, safe tub transfer with tub bench, fall prevention, energy conservation, and use of ice for pain management.     Vision Vision Assessment?: No apparent visual deficits   Perception     Praxis      Pertinent Vitals/Pain Pain Assessment: 0-10 Pain Score: 1  Pain Location: R knee while ambulating Pain Descriptors / Indicators: Sore Pain Intervention(s): Limited activity within patient's tolerance;Monitored during session;Repositioned     Hand Dominance Left   Extremity/Trunk Assessment Upper Extremity Assessment Upper Extremity Assessment: Overall WFL for tasks assessed   Lower Extremity Assessment Lower Extremity  Assessment: LLE  deficits/detail LLE Deficits / Details: Decreased strength and ROM as expected post-operatively.       Communication Communication Communication: No difficulties   Cognition Arousal/Alertness: Awake/alert Behavior During Therapy: WFL for tasks assessed/performed Overall Cognitive Status: Within Functional Limits for tasks assessed                     General Comments       Exercises Exercises: Total Joint     Shoulder Instructions      Home Living Family/patient expects to be discharged to:: Private residence Living Arrangements: Alone Available Help at Discharge: Family;Friend(s);Available 24 hours/day;Other (Comment) (Family lives close by) Type of Home: House Home Access: Stairs to enter Technical brewer of Steps: 3 Entrance Stairs-Rails: Right Home Layout: One level     Bathroom Shower/Tub: Tub/shower unit;Curtain Shower/tub characteristics: Architectural technologist: Handicapped height Bathroom Accessibility: Yes   Home Equipment: Environmental consultant - 2 wheels;Bedside commode;Cane - single point;Grab bars - tub/shower;Shower seat          Prior Functioning/Environment Level of Independence: Independent        Comments: was completely independent, working as a Water engineer Problem List: Decreased strength;Decreased range of motion;Decreased activity tolerance;Impaired balance (sitting and/or standing);Decreased safety awareness;Decreased knowledge of use of DME or AE;Decreased knowledge of precautions;Pain   OT Treatment/Interventions:      OT Goals(Current goals can be found in the care plan section) Acute Rehab OT Goals Patient Stated Goal: to go home OT Goal Formulation: With patient Time For Goal Achievement: 01/21/17 Potential to Achieve Goals: Good  OT Frequency:     Barriers to D/C:            Co-evaluation              End of Session Equipment Utilized During Treatment: Gait belt;Rolling walker  Activity Tolerance: Patient  tolerated treatment well Patient left: in bed;with call bell/phone within reach;with family/visitor present   Time: 1435-1454 OT Time Calculation (min): 19 min Charges:  OT General Charges $OT Visit: 1 Procedure OT Evaluation $OT Eval Moderate Complexity: 1 Procedure  Norman Herrlich, OTR/L [phone] 01/14/2017, 3:55 PM

## 2017-01-15 LAB — CBC
HCT: 33.4 % — ABNORMAL LOW (ref 39.0–52.0)
Hemoglobin: 11.6 g/dL — ABNORMAL LOW (ref 13.0–17.0)
MCH: 30.4 pg (ref 26.0–34.0)
MCHC: 34.7 g/dL (ref 30.0–36.0)
MCV: 87.4 fL (ref 78.0–100.0)
PLATELETS: 142 10*3/uL — AB (ref 150–400)
RBC: 3.82 MIL/uL — AB (ref 4.22–5.81)
RDW: 13.3 % (ref 11.5–15.5)
WBC: 8.8 10*3/uL (ref 4.0–10.5)

## 2017-01-15 MED ORDER — APIXABAN 2.5 MG PO TABS: 2.5000 mg | ORAL_TABLET | Freq: Two times a day (BID) | ORAL | 0 refills | Status: DC

## 2017-01-15 NOTE — Progress Notes (Signed)
Discharge instructions provided to patient.  IV removed.  Dr. Ninfa Linden contacted for discharge summary, eliquis prescription and face to face.  Information received.  Patient escorted via wheelchair.

## 2017-01-15 NOTE — Progress Notes (Signed)
Physical Therapy Treatment Patient Details Name: Christian Weeks MRN: [id number] DOB: 1951/08/07 Today's Date: 01/15/2017    History of Present Illness 66 yo male admitted on 01/13/17 for right TKA. 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     PT Comments    Pt is POD 2 and moving well with therapy this session. Gait starts out slower and improves as distances increases and patients becomes more comfortable. Pt relies less on RW for mobility this session. Increased gait distance noted. Reviewed exercises and measurements taken. Pt is making excellent progress with therapy.    Follow Up Recommendations  Home health PT;Supervision/Assistance - 24 hour     Equipment Recommendations  None recommended by PT    Recommendations for Other Services       Precautions / Restrictions Precautions Precautions: Knee Precaution Booklet Issued: Yes (comment) Precaution Comments: reviewed no pillow under knee and knee precautions during ADL Required Braces or Orthoses: Knee Immobilizer - Right Knee Immobilizer - Right: On except when in CPM Restrictions Weight Bearing Restrictions: Yes RLE Weight Bearing: Weight bearing as tolerated    Mobility  Bed Mobility Overal bed mobility: Modified Independent                Transfers Overall transfer level: Needs assistance Equipment used: Rolling walker (2 wheeled) Transfers: Sit to/from Stand Sit to Stand: Supervision         General transfer comment: safe hand placement; supervision for safety  Ambulation/Gait Ambulation/Gait assistance: Supervision Ambulation Distance (Feet): 500 Feet Assistive device: Rolling walker (2 wheeled) Gait Pattern/deviations: Step-through pattern;Decreased step length - left;Decreased stance time - right;Decreased weight shift to right Gait velocity: decreased Gait velocity interpretation: Below normal speed for age/gender General Gait Details: pt  with steady gait and good step through pattern; cues for R knee extension during stance phase and posture   Stairs            Wheelchair Mobility    Modified Rankin (Stroke Patients Only)       Balance                                    Cognition Arousal/Alertness: Awake/alert Behavior During Therapy: WFL for tasks assessed/performed Overall Cognitive Status: Within Functional Limits for tasks assessed                      Exercises Total Joint Exercises Long Arc Quad: AROM;Right;10 reps;Seated Knee Flexion: AROM;Right;10 reps;Seated Goniometric ROM: 5-95    General Comments        Pertinent Vitals/Pain Pain Assessment: 0-10 Pain Score: 2  Pain Location: R knee while ambulating Pain Descriptors / Indicators: Sore Pain Intervention(s): Limited activity within patient's tolerance;Monitored during session;Ice applied;Premedicated before session    Home Living                      Prior Function            PT Goals (current goals can now be found in the care plan section) Acute Rehab PT Goals Patient Stated Goal: to go home Progress towards PT goals: Progressing toward goals    Frequency    7X/week      PT Plan Current plan remains appropriate    Co-evaluation             End of Session Equipment  Utilized During Treatment: Gait belt Activity Tolerance: Patient tolerated treatment well Patient left: in chair;with call bell/phone within reach;Other (comment) (Zero degree foam)     Time: [id number] PT Time Calculation (min) (ACUTE ONLY): 26 min  Charges:  $Gait Training: 8-22 mins $Therapeutic Exercise: 8-22 mins                    G Codes:      Scheryl Marten PT, DPT  [phone]  01/15/2017, 8:45 AM

## 2017-01-15 NOTE — Discharge Summary (Signed)
Patient ID: Christian Weeks MRN: [id number] DOB/AGE: 04/29/1951 66 y.o.  Admit date: 01/13/2017 Discharge date: 01/15/2017  Admission Diagnoses:  Active Problems:   Unilateral primary osteoarthritis, right knee   Arthritis of right knee   Discharge Diagnoses:  Same  Past Medical History:  Diagnosis Date  . Arthritis   . Blood clot in vein 01/2016  . Degenerative joint disease   . History of kidney stones   . Hypertension   . Hypothyroidism   . Stroke ([place]) 01/2016   speech difficulty for a few hours  . Wears dentures   . Wears glasses     Surgeries: Procedure(s): RIGHT TOTAL KNEE ARTHROPLASTY on 01/13/2017   Consultants:   Discharged Condition: Improved  Hospital Course: IZAEAH HLAVAC is an 66 y.o. male who was admitted 01/13/2017 for operative treatment of<principal problem not specified>. Patient has severe unremitting pain that affects sleep, daily activities, and work/hobbies. After pre-op clearance the patient was taken to the operating room on 01/13/2017 and underwent  Procedure(s): RIGHT TOTAL KNEE ARTHROPLASTY.    Patient was given perioperative antibiotics: Anti-infectives    Start     Dose/Rate Route Frequency Ordered Stop   01/13/17 0551  ceFAZolin (ANCEF) IVPB 2g/100 mL premix     2 g 200 mL/hr over 30 Minutes Intravenous On call to O.R. 01/13/17 [id number] 01/13/17 0803       Patient was given sequential compression devices, early ambulation, and chemoprophylaxis to prevent DVT.  Patient benefited maximally from hospital stay and there were no complications.    Recent vital signs: Patient Vitals for the past 24 hrs:  BP Temp Temp src Pulse Resp SpO2  01/15/17 0932 109/70 - - - - -  01/15/17 0525 104/69 98.2 F (36.8 C) Oral 66 18 95 %  01/14/17 2053 113/74 98.2 F (36.8 C) Oral 71 18 96 %  01/14/17 1500 127/72 98.6 F (37 C) Oral 68 18 100 %     Recent laboratory studies:  Recent Labs  01/14/17 0337 01/15/17 0514  WBC 16.1* 8.8  HGB 12.7* 11.6*   HCT 37.7* 33.4*  PLT 182 142*  NA 137  --   K 4.5  --   CL 105  --   CO2 25  --   BUN 12  --   CREATININE 1.20  --   GLUCOSE 126*  --   CALCIUM 8.9  --      Discharge Medications:   Allergies as of 01/15/2017   No Known Allergies     Medication List    TAKE these medications   acetaminophen 500 MG tablet Commonly known as:  TYLENOL Take 1,000 mg by mouth every 6 (six) hours as needed for moderate pain.   apixaban 2.5 MG Tabs tablet Commonly known as:  ELIQUIS Take 1 tablet (2.5 mg total) by mouth every 12 (twelve) hours.   levothyroxine 137 MCG tablet Commonly known as:  SYNTHROID, LEVOTHROID Take 137 mcg by mouth daily before breakfast.   losartan 100 MG tablet Commonly known as:  COZAAR Take 50 mg by mouth daily.   Vitamin D3 3000 units Tabs Take 3,000 Units by mouth daily.       Diagnostic Studies: Dg Chest 2 View  Result Date: 01/04/2017 CLINICAL DATA:  Preoperative examination prior to knee replacement surgery. History of previous CVA, hypertension, former smoker. EXAM: CHEST  2 VIEW COMPARISON:  PA and lateral chest x-ray of January 06, 2016 and July 28, 2015. FINDINGS: The lungs are adequately inflated. There  is no focal infiltrate. The interstitial markings are coarse. There is increased density in the retrocardiac region on the lateral view which is stable. There is no pleural effusion. There is stable prominence of the inferior aspect of the left first costomanubrial junction. The heart and pulmonary vascularity are normal. There is calcification in the wall of the aortic arch. IMPRESSION: Chronic bronchitic-smoking related changes. No acute pneumonia. Density in the retrocardiac region on the lateral view has been present as far back as August of 2016, but no older studies are available. This likely reflects a confluence of normal densities but given the smoking history a contrast-enhanced chest CT scan is recommended to more completely evaluate the lungs and  exclude underlying malignancy. These results will be called to the ordering clinician or representative by the Radiologist Assistant, and communication documented in the PACS or zVision Dashboard. Electronically Signed   By: David  Martinique M.D.   On: 01/04/2017 10:19   Ct Chest W Contrast  Result Date: 01/14/2017 CLINICAL DATA:  Unexplained retrocardiac density on a recent lateral chest radiograph view. Inpatient. EXAM: CT CHEST WITH CONTRAST TECHNIQUE: Multidetector CT imaging of the chest was performed during intravenous contrast administration. CONTRAST:  61mL ISOVUE-300 IOPAMIDOL (ISOVUE-300) INJECTION 61% COMPARISON:  01/04/2017 chest radiograph. FINDINGS: Cardiovascular: Top-normal heart size. No significant pericardial fluid/thickening. Left anterior descending coronary atherosclerosis. Atherosclerotic and tortuous thoracic aorta with aneurysmal 4.7 cm maximum diameter ascending thoracic aorta. Normal caliber pulmonary arteries. No central pulmonary emboli. Mediastinum/Nodes: Heterogeneous and atrophic appearing thyroid gland without discrete thyroid nodules. Unremarkable esophagus. No pathologically enlarged axillary, mediastinal or hilar lymph nodes. Lungs/Pleura: No pneumothorax. No pleural effusion. There are four scattered solid pulmonary nodules, largest 5 mm in the right middle lobe associated with the minor fissure (series 3/image 79) and 5 mm in the lingula (series 3/ image 100). No acute consolidative airspace disease, lung masses or additional significant pulmonary nodules. Mild centrilobular and paraseptal emphysema. Upper abdomen: Unremarkable. Musculoskeletal: No aggressive appearing focal osseous lesions. Moderate thoracic spondylosis. IMPRESSION: 1. Retrocardiac density on the lateral chest radiograph view correlates with focal tortuosity of the descending thoracic aorta. No lung masses. No consolidative airspace disease. 2. Four scattered solid pulmonary nodules, largest 5 mm. No follow-up  needed if patient is low-risk (and has no known or suspected primary neoplasm). Non-contrast chest CT can be considered in 12 months if patient is high-risk. This recommendation follows the consensus statement: Guidelines for Management of Incidental Pulmonary Nodules Detected on CT Images: From the Fleischner Society 2017; Radiology 2017; 284:228-243. 3. Aortic atherosclerosis. Ascending thoracic aortic aneurysm, maximum diameter 4.7 cm. Ascending thoracic aortic aneurysm. Recommend semi-annual imaging followup by CTA or MRA and referral to cardiothoracic surgery if not already obtained. This recommendation follows 2010 ACCF/AHA/AATS/ACR/ASA/SCA/SCAI/SIR/STS/SVM Guidelines for the Diagnosis and Management of Patients With Thoracic Aortic Disease. Circulation. 2010; 121: [id number]. 4. Mild emphysema. Electronically Signed   By: Ilona Sorrel M.D.   On: 01/14/2017 07:59   Dg Knee Right Port  Result Date: 01/13/2017 CLINICAL DATA:  Total knee replacement. EXAM: PORTABLE RIGHT KNEE - 1-2 VIEW COMPARISON:  10/25/2016 FINDINGS: Total knee arthroplasty without periprosthetic fracture or subluxation. Expected postoperative soft tissue swelling and gas. Approximately 17 mm rounded area sclerosis in the distal femur favoring benign chondroid lesion, better visualized on preoperative imaging. IMPRESSION: No acute finding after total knee arthroplasty. Electronically Signed   By: Monte Fantasia M.D.   On: 01/13/2017 11:02    Disposition: 01-Home or Self Care  Discharge Instructions  Call MD / Call 911    Complete by:  As directed    If you experience chest pain or shortness of breath, CALL 911 and be transported to the hospital emergency room.  If you develope a fever above 101.5 F, pus (white drainage) or increased drainage or redness at the wound, or calf pain, call your surgeon's office.   Constipation Prevention    Complete by:  As directed    Drink plenty of fluids.  Prune juice may be helpful.  You may  use a stool softener, such as Colace (over the counter) 100 mg twice a day.  Use MiraLax (over the counter) for constipation as needed.   Discharge patient    Complete by:  As directed    Discharge disposition:  01-Home or Self Care   Discharge patient date:  01/15/2017   Driving restrictions    Complete by:  As directed    No driving while taking narcotic pain meds.   Increase activity slowly as tolerated    Complete by:  As directed       Follow-up Information    [facility] Follow up.   Why:  Someone from [facility] will contact you to arrange start date and time for therapy. Contact information: [address] [place] [address] [phone]            Signed: Mcarthur Rossetti 01/15/2017, 2:38 PM

## 2017-01-16 DIAGNOSIS — Z8673 Personal history of transient ischemic attack (TIA), and cerebral infarction without residual deficits: Secondary | ICD-10-CM | POA: Diagnosis not present

## 2017-01-16 DIAGNOSIS — Z96652 Presence of left artificial knee joint: Secondary | ICD-10-CM | POA: Diagnosis not present

## 2017-01-16 DIAGNOSIS — E785 Hyperlipidemia, unspecified: Secondary | ICD-10-CM | POA: Diagnosis not present

## 2017-01-16 DIAGNOSIS — Z86718 Personal history of other venous thrombosis and embolism: Secondary | ICD-10-CM | POA: Diagnosis not present

## 2017-01-16 DIAGNOSIS — Q211 Atrial septal defect: Secondary | ICD-10-CM | POA: Diagnosis not present

## 2017-01-16 DIAGNOSIS — M1990 Unspecified osteoarthritis, unspecified site: Secondary | ICD-10-CM | POA: Diagnosis not present

## 2017-01-16 DIAGNOSIS — Z471 Aftercare following joint replacement surgery: Secondary | ICD-10-CM | POA: Diagnosis not present

## 2017-01-16 DIAGNOSIS — Z96651 Presence of right artificial knee joint: Secondary | ICD-10-CM | POA: Diagnosis not present

## 2017-01-16 DIAGNOSIS — I10 Essential (primary) hypertension: Secondary | ICD-10-CM | POA: Diagnosis not present

## 2017-01-17 ENCOUNTER — Encounter (HOSPITAL_COMMUNITY): Payer: Self-pay | Admitting: Orthopaedic Surgery

## 2017-01-18 DIAGNOSIS — M1990 Unspecified osteoarthritis, unspecified site: Secondary | ICD-10-CM | POA: Diagnosis not present

## 2017-01-18 DIAGNOSIS — E785 Hyperlipidemia, unspecified: Secondary | ICD-10-CM | POA: Diagnosis not present

## 2017-01-18 DIAGNOSIS — Z86718 Personal history of other venous thrombosis and embolism: Secondary | ICD-10-CM | POA: Diagnosis not present

## 2017-01-18 DIAGNOSIS — Z471 Aftercare following joint replacement surgery: Secondary | ICD-10-CM | POA: Diagnosis not present

## 2017-01-18 DIAGNOSIS — Z96651 Presence of right artificial knee joint: Secondary | ICD-10-CM | POA: Diagnosis not present

## 2017-01-18 DIAGNOSIS — I10 Essential (primary) hypertension: Secondary | ICD-10-CM | POA: Diagnosis not present

## 2017-01-18 DIAGNOSIS — Q211 Atrial septal defect: Secondary | ICD-10-CM | POA: Diagnosis not present

## 2017-01-18 DIAGNOSIS — Z8673 Personal history of transient ischemic attack (TIA), and cerebral infarction without residual deficits: Secondary | ICD-10-CM | POA: Diagnosis not present

## 2017-01-18 DIAGNOSIS — Z96652 Presence of left artificial knee joint: Secondary | ICD-10-CM | POA: Diagnosis not present

## 2017-01-18 NOTE — Addendum Note (Signed)
Addendum  created 01/18/17 2154 by Belinda Block, MD   Sign clinical note

## 2017-01-20 ENCOUNTER — Telehealth (INDEPENDENT_AMBULATORY_CARE_PROVIDER_SITE_OTHER): Payer: Self-pay | Admitting: *Deleted

## 2017-01-20 DIAGNOSIS — I10 Essential (primary) hypertension: Secondary | ICD-10-CM | POA: Diagnosis not present

## 2017-01-20 DIAGNOSIS — Q211 Atrial septal defect: Secondary | ICD-10-CM | POA: Diagnosis not present

## 2017-01-20 DIAGNOSIS — Z86718 Personal history of other venous thrombosis and embolism: Secondary | ICD-10-CM | POA: Diagnosis not present

## 2017-01-20 DIAGNOSIS — Z8673 Personal history of transient ischemic attack (TIA), and cerebral infarction without residual deficits: Secondary | ICD-10-CM | POA: Diagnosis not present

## 2017-01-20 DIAGNOSIS — Z96651 Presence of right artificial knee joint: Secondary | ICD-10-CM | POA: Diagnosis not present

## 2017-01-20 DIAGNOSIS — Z96652 Presence of left artificial knee joint: Secondary | ICD-10-CM | POA: Diagnosis not present

## 2017-01-20 DIAGNOSIS — Z471 Aftercare following joint replacement surgery: Secondary | ICD-10-CM | POA: Diagnosis not present

## 2017-01-20 DIAGNOSIS — E785 Hyperlipidemia, unspecified: Secondary | ICD-10-CM | POA: Diagnosis not present

## 2017-01-20 DIAGNOSIS — M1990 Unspecified osteoarthritis, unspecified site: Secondary | ICD-10-CM | POA: Diagnosis not present

## 2017-01-20 NOTE — Telephone Encounter (Signed)
JUST LEAVE IT ALONE UNLESS DRAINING OUT.

## 2017-01-20 NOTE — Telephone Encounter (Signed)
I called Cheryl and advised. °

## 2017-01-20 NOTE — Telephone Encounter (Signed)
Malachy Mood from [facility] called this afternoon in regards to this patient's dressing changes. She wanted to know when they could be removed and instructions on what to do please? Her cb #  [phone]. Thank you

## 2017-01-20 NOTE — Telephone Encounter (Signed)
Ok for dry dressing change until follow up appt?

## 2017-01-23 DIAGNOSIS — Z96652 Presence of left artificial knee joint: Secondary | ICD-10-CM | POA: Diagnosis not present

## 2017-01-23 DIAGNOSIS — Q211 Atrial septal defect: Secondary | ICD-10-CM | POA: Diagnosis not present

## 2017-01-23 DIAGNOSIS — E785 Hyperlipidemia, unspecified: Secondary | ICD-10-CM | POA: Diagnosis not present

## 2017-01-23 DIAGNOSIS — Z96651 Presence of right artificial knee joint: Secondary | ICD-10-CM | POA: Diagnosis not present

## 2017-01-23 DIAGNOSIS — I10 Essential (primary) hypertension: Secondary | ICD-10-CM | POA: Diagnosis not present

## 2017-01-23 DIAGNOSIS — Z8673 Personal history of transient ischemic attack (TIA), and cerebral infarction without residual deficits: Secondary | ICD-10-CM | POA: Diagnosis not present

## 2017-01-23 DIAGNOSIS — M1990 Unspecified osteoarthritis, unspecified site: Secondary | ICD-10-CM | POA: Diagnosis not present

## 2017-01-23 DIAGNOSIS — Z86718 Personal history of other venous thrombosis and embolism: Secondary | ICD-10-CM | POA: Diagnosis not present

## 2017-01-23 DIAGNOSIS — Z471 Aftercare following joint replacement surgery: Secondary | ICD-10-CM | POA: Diagnosis not present

## 2017-01-25 DIAGNOSIS — Q211 Atrial septal defect: Secondary | ICD-10-CM | POA: Diagnosis not present

## 2017-01-25 DIAGNOSIS — E785 Hyperlipidemia, unspecified: Secondary | ICD-10-CM | POA: Diagnosis not present

## 2017-01-25 DIAGNOSIS — I10 Essential (primary) hypertension: Secondary | ICD-10-CM | POA: Diagnosis not present

## 2017-01-25 DIAGNOSIS — Z8673 Personal history of transient ischemic attack (TIA), and cerebral infarction without residual deficits: Secondary | ICD-10-CM | POA: Diagnosis not present

## 2017-01-25 DIAGNOSIS — Z96651 Presence of right artificial knee joint: Secondary | ICD-10-CM | POA: Diagnosis not present

## 2017-01-25 DIAGNOSIS — M1990 Unspecified osteoarthritis, unspecified site: Secondary | ICD-10-CM | POA: Diagnosis not present

## 2017-01-25 DIAGNOSIS — Z471 Aftercare following joint replacement surgery: Secondary | ICD-10-CM | POA: Diagnosis not present

## 2017-01-25 DIAGNOSIS — Z96652 Presence of left artificial knee joint: Secondary | ICD-10-CM | POA: Diagnosis not present

## 2017-01-25 DIAGNOSIS — Z86718 Personal history of other venous thrombosis and embolism: Secondary | ICD-10-CM | POA: Diagnosis not present

## 2017-01-26 DIAGNOSIS — Z8673 Personal history of transient ischemic attack (TIA), and cerebral infarction without residual deficits: Secondary | ICD-10-CM | POA: Diagnosis not present

## 2017-01-26 DIAGNOSIS — M1990 Unspecified osteoarthritis, unspecified site: Secondary | ICD-10-CM | POA: Diagnosis not present

## 2017-01-26 DIAGNOSIS — I10 Essential (primary) hypertension: Secondary | ICD-10-CM | POA: Diagnosis not present

## 2017-01-26 DIAGNOSIS — Z96651 Presence of right artificial knee joint: Secondary | ICD-10-CM | POA: Diagnosis not present

## 2017-01-26 DIAGNOSIS — Z471 Aftercare following joint replacement surgery: Secondary | ICD-10-CM | POA: Diagnosis not present

## 2017-01-26 DIAGNOSIS — E785 Hyperlipidemia, unspecified: Secondary | ICD-10-CM | POA: Diagnosis not present

## 2017-01-26 DIAGNOSIS — Q211 Atrial septal defect: Secondary | ICD-10-CM | POA: Diagnosis not present

## 2017-01-26 DIAGNOSIS — Z96652 Presence of left artificial knee joint: Secondary | ICD-10-CM | POA: Diagnosis not present

## 2017-01-26 DIAGNOSIS — Z86718 Personal history of other venous thrombosis and embolism: Secondary | ICD-10-CM | POA: Diagnosis not present

## 2017-01-27 ENCOUNTER — Ambulatory Visit (INDEPENDENT_AMBULATORY_CARE_PROVIDER_SITE_OTHER): Payer: Medicare HMO

## 2017-01-27 ENCOUNTER — Encounter (INDEPENDENT_AMBULATORY_CARE_PROVIDER_SITE_OTHER): Payer: Self-pay | Admitting: Orthopaedic Surgery

## 2017-01-27 ENCOUNTER — Ambulatory Visit (INDEPENDENT_AMBULATORY_CARE_PROVIDER_SITE_OTHER): Payer: Self-pay | Admitting: Orthopaedic Surgery

## 2017-01-27 VITALS — BP 120/79 | HR 73 | Ht 72.0 in | Wt 236.0 lb

## 2017-01-27 DIAGNOSIS — M1711 Unilateral primary osteoarthritis, right knee: Secondary | ICD-10-CM

## 2017-01-27 DIAGNOSIS — Z96651 Presence of right artificial knee joint: Secondary | ICD-10-CM

## 2017-01-27 NOTE — Progress Notes (Addendum)
Post-Op Visit Note   Patient: Christian Weeks           Date of Birth: 01/05/1951           MRN: [id number] Visit Date: 01/27/2017 PCP: Horatio Pel, MD   Assessment & Plan:  Chief Complaint:  Chief Complaint  Patient presents with  . Right Knee - Routine Post Op   Visit Diagnoses:  1. Unilateral primary osteoarthritis, right knee   2. S/P total knee arthroplasty, right     Plan: He lacks about 3 reaching full extension he's been working on prone positioning to get this. Flexion to 108, quad strength is significantly improved with walking without any ambulatory aids. He is off his pain medication. He had previous DVT last year when he had his left total knee arthroplasty in his left calf. He'll continue his course until his 1 month after surgery and then he can stop.  Follow-Up Instructions: Return in about 4 weeks (around 02/24/2017).   Orders:  Orders Placed This Encounter  Procedures  . XR Knee 1-2 Views Right   No orders of the defined types were placed in this encounter.  HPI Patient returns for first post op visit. He is status post right total knee arthroplasty on 01/13/2017. He is two weeks out. He is doing well. Has been doing well in home health physical therapy. He states he has 2 visits left. His pain is better than before the surgery, but he says he still has a dull ache. He is taking Tylenol for pain with relief. He would like to discuss discontinuing Eliquis. Incision looks good, staples removed, steris applied.   Imaging: No results found.  PMFS History: Patient Active Problem List   Diagnosis Date Noted  . Arthritis of right knee 01/13/2017  . Unilateral primary osteoarthritis, right knee 10/25/2016  . DVT (deep venous thrombosis), left 04/13/2016  . Cerebrovascular accident (CVA) due to embolism of cerebral artery ([place]) 04/13/2016  . S/P knee surgery 04/13/2016  . PFO (patent foramen ovale)   . HLD (hyperlipidemia)   . Acute CVA  (cerebrovascular accident) ([place]) 02/06/2016  . TIA (transient ischemic attack) 02/05/2016  . Stroke ([place]) 02/05/2016  . Hypothyroidism   . Arthritis   . Calf pain   . S/P total knee arthroplasty, right 01/15/2016   Past Medical History:  Diagnosis Date  . Arthritis   . Blood clot in vein 01/2016  . Degenerative joint disease   . History of kidney stones   . Hypertension   . Hypothyroidism   . Stroke ([place]) 01/2016   speech difficulty for a few hours  . Wears dentures   . Wears glasses     Family History  Problem Relation Age of Onset  . Congestive Heart Failure Mother   . Diabetes Mother   . Thyroid disease Sister     Past Surgical History:  Procedure Laterality Date  . KNEE ARTHROPLASTY Left 01/15/2016   Procedure: COMPUTER ASSISTED TOTAL KNEE ARTHROPLASTY;  Surgeon: [name], MD;  Location: [facility];  Service: Orthopedics;  Laterality: Left;  . TOTAL KNEE ARTHROPLASTY Right 01/13/2017   Procedure: RIGHT TOTAL KNEE ARTHROPLASTY;  Surgeon: [name], MD;  Location: [facility];  Service: Orthopedics;  Laterality: Right;   Social History   Occupational History  . Not on file.   Social History Main Topics  . Smoking status: Former Smoker    Quit date: 11/16/1996  . Smokeless tobacco: Never Used  . Alcohol use No  .  Drug use: No  . Sexual activity: Not on file

## 2017-01-31 DIAGNOSIS — Z471 Aftercare following joint replacement surgery: Secondary | ICD-10-CM | POA: Diagnosis not present

## 2017-01-31 DIAGNOSIS — Z8673 Personal history of transient ischemic attack (TIA), and cerebral infarction without residual deficits: Secondary | ICD-10-CM | POA: Diagnosis not present

## 2017-01-31 DIAGNOSIS — E785 Hyperlipidemia, unspecified: Secondary | ICD-10-CM | POA: Diagnosis not present

## 2017-01-31 DIAGNOSIS — I10 Essential (primary) hypertension: Secondary | ICD-10-CM | POA: Diagnosis not present

## 2017-01-31 DIAGNOSIS — Q211 Atrial septal defect: Secondary | ICD-10-CM | POA: Diagnosis not present

## 2017-01-31 DIAGNOSIS — Z96652 Presence of left artificial knee joint: Secondary | ICD-10-CM | POA: Diagnosis not present

## 2017-01-31 DIAGNOSIS — M1990 Unspecified osteoarthritis, unspecified site: Secondary | ICD-10-CM | POA: Diagnosis not present

## 2017-01-31 DIAGNOSIS — Z96651 Presence of right artificial knee joint: Secondary | ICD-10-CM | POA: Diagnosis not present

## 2017-01-31 DIAGNOSIS — Z86718 Personal history of other venous thrombosis and embolism: Secondary | ICD-10-CM | POA: Diagnosis not present

## 2017-02-02 ENCOUNTER — Telehealth (INDEPENDENT_AMBULATORY_CARE_PROVIDER_SITE_OTHER): Payer: Self-pay

## 2017-02-02 DIAGNOSIS — E785 Hyperlipidemia, unspecified: Secondary | ICD-10-CM | POA: Diagnosis not present

## 2017-02-02 DIAGNOSIS — Z86718 Personal history of other venous thrombosis and embolism: Secondary | ICD-10-CM | POA: Diagnosis not present

## 2017-02-02 DIAGNOSIS — Z96651 Presence of right artificial knee joint: Secondary | ICD-10-CM | POA: Diagnosis not present

## 2017-02-02 DIAGNOSIS — I10 Essential (primary) hypertension: Secondary | ICD-10-CM | POA: Diagnosis not present

## 2017-02-02 DIAGNOSIS — M1990 Unspecified osteoarthritis, unspecified site: Secondary | ICD-10-CM | POA: Diagnosis not present

## 2017-02-02 DIAGNOSIS — Z96652 Presence of left artificial knee joint: Secondary | ICD-10-CM | POA: Diagnosis not present

## 2017-02-02 DIAGNOSIS — Z471 Aftercare following joint replacement surgery: Secondary | ICD-10-CM | POA: Diagnosis not present

## 2017-02-02 DIAGNOSIS — Z8673 Personal history of transient ischemic attack (TIA), and cerebral infarction without residual deficits: Secondary | ICD-10-CM | POA: Diagnosis not present

## 2017-02-02 DIAGNOSIS — Q211 Atrial septal defect: Secondary | ICD-10-CM | POA: Diagnosis not present

## 2017-02-02 NOTE — Telephone Encounter (Signed)
Refill of Eliquis

## 2017-02-02 NOTE — Telephone Encounter (Signed)
Please advise 

## 2017-02-07 NOTE — Telephone Encounter (Signed)
[facility]. He should be off this now. Was for 4 wks then stop. thanks

## 2017-02-08 NOTE — Telephone Encounter (Signed)
I called and spoke with patient to advise that he no longer needs to take eliquis, he was suppose to take for 4 weeks only per Dr. Lorin Mercy. He expressed understanding. And he is going to call his pharmacy to tell them he is not taking this anymore.

## 2017-02-21 ENCOUNTER — Ambulatory Visit (INDEPENDENT_AMBULATORY_CARE_PROVIDER_SITE_OTHER): Payer: Medicare HMO | Admitting: Orthopaedic Surgery

## 2017-02-21 VITALS — BP 106/82 | HR 80

## 2017-02-21 DIAGNOSIS — Z96651 Presence of right artificial knee joint: Secondary | ICD-10-CM

## 2017-02-21 DIAGNOSIS — I712 Thoracic aortic aneurysm, without rupture, unspecified: Secondary | ICD-10-CM

## 2017-02-21 NOTE — Progress Notes (Addendum)
Office Visit Note   Patient: Christian Weeks           Date of Birth: [birth year]           MRN: [id number] Visit Date: 02/21/2017              Requested by: Christian Pretty, MD [address] [place], [place] [address] PCP: Christian Pel, MD   Assessment & Plan: Visit Diagnoses:  1. S/P total knee arthroplasty, right   2. Thoracic aortic aneurysm without rupture ([place])     Plan: He is doing well his total knee from quad standpoint and also flexion. He needs some more work on extension of his right knee and we rediscussed prone positioning with weight that he continues to get the last 5 of extension in his knee. Stability is good incisions well-healed. I will recheck him in one month. Patient had some small granuloma present on chest x-ray and these appear stable with the largest of the 5 being 5 mm in size. Incidental finding of thoracic aneurysm 4.7 cm. Recommendations from the radiologist was for thoracic surgery referral so they can follow him we will schedule this for him. He will return to see me in one month to check his knee range of motion.  Follow-Up Instructions: Return in about 1 month (around 03/21/2017).   Orders:  No orders of the defined types were placed in this encounter.  No orders of the defined types were placed in this encounter.     Procedures: No procedures performed   Clinical Data: No additional findings.   Subjective: Chief Complaint  Patient presents with  . Left Knee - Follow-up    Christian Weeks here today 5 weeks, 4 days post operative from total knee replacement. He states he is doing very well. His ROM and strength are getting much better. He is going up and downstairs with ease at this point.     Review of Systems 14 point review of systems updated from surgery and unchanged other than CT scan chest results as above.   Objective: Vital Signs: BP 106/82 (BP Location: Left Arm, Patient Position: Sitting, Cuff Size: Normal)    Pulse 80   Physical Exam patient has 115 flexion of the knee incisions well-healed he lacks 3-5 reaching full extension collateral ligaments are stable no significant knee effusion. Skin incision is well-healed.  Ortho Exam  Specialty Comments:  No specialty comments available.  Imaging: No results found.   PMFS History: Patient Active Problem List   Diagnosis Date Noted  . Thoracic aortic aneurysm without rupture ([place]) 02/21/2017  . Arthritis of right knee 01/13/2017  . Unilateral primary osteoarthritis, right knee 10/25/2016  . DVT (deep venous thrombosis), left 04/13/2016  . Cerebrovascular accident (CVA) due to embolism of cerebral artery ([place]) 04/13/2016  . S/P knee surgery 04/13/2016  . PFO (patent foramen ovale)   . HLD (hyperlipidemia)   . Acute CVA (cerebrovascular accident) ([place]) 02/06/2016  . TIA (transient ischemic attack) 02/05/2016  . Stroke ([place]) 02/05/2016  . Hypothyroidism   . Arthritis   . Calf pain   . S/P total knee arthroplasty, right 01/15/2016   Past Medical History:  Diagnosis Date  . Arthritis   . Blood clot in vein 01/2016  . Degenerative joint disease   . History of kidney stones   . Hypertension   . Hypothyroidism   . Stroke ([place]) 01/2016   speech difficulty for a few hours  . Wears dentures   .  Wears glasses     Family History  Problem Relation Age of Onset  . Congestive Heart Failure Mother   . Diabetes Mother   . Thyroid disease Sister     Past Surgical History:  Procedure Laterality Date  . KNEE ARTHROPLASTY Left 01/15/2016   Procedure: COMPUTER ASSISTED TOTAL KNEE ARTHROPLASTY;  Surgeon: Christian Killings, MD;  Location: [facility];  Service: Orthopedics;  Laterality: Left;  . TOTAL KNEE ARTHROPLASTY Right 01/13/2017   Procedure: RIGHT TOTAL KNEE ARTHROPLASTY;  Surgeon: Christian Killings, MD;  Location: [facility];  Service: Orthopedics;  Laterality: Right;   Social History   Occupational History  . Not on file.   Social History Main  Topics  . Smoking status: Former Smoker    Quit date: 11/16/1996  . Smokeless tobacco: Never Used  . Alcohol use No  . Drug use: No  . Sexual activity: Not on file

## 2017-03-07 ENCOUNTER — Telehealth (INDEPENDENT_AMBULATORY_CARE_PROVIDER_SITE_OTHER): Payer: Self-pay | Admitting: Orthopaedic Surgery

## 2017-03-07 DIAGNOSIS — I712 Thoracic aortic aneurysm, without rupture, unspecified: Secondary | ICD-10-CM

## 2017-03-07 NOTE — Telephone Encounter (Signed)
I sent the referral over and spoke with [facility] Referral coordinator and she stated that she will work on getting him scheduled but would not be this week.

## 2017-03-07 NOTE — Telephone Encounter (Signed)
Pt checking status of a referral he was suppose to have sent for a Cardiologist for an aneurism.   [phone]

## 2017-03-07 NOTE — Telephone Encounter (Signed)
Can you call patient once appt is made with Dr. Servando Snare or let me know and I will call him? Thanks.

## 2017-03-08 NOTE — Telephone Encounter (Signed)
I called and spoke with patient. He has appt with Dr. Jobie Quaker next Wednesday.

## 2017-03-15 ENCOUNTER — Institutional Professional Consult (permissible substitution) (INDEPENDENT_AMBULATORY_CARE_PROVIDER_SITE_OTHER): Payer: Medicare HMO | Admitting: Cardiothoracic Surgery

## 2017-03-15 ENCOUNTER — Encounter: Payer: Self-pay | Admitting: Cardiothoracic Surgery

## 2017-03-15 VITALS — BP 118/88 | HR 64 | Resp 20 | Ht 72.0 in | Wt 240.0 lb

## 2017-03-15 DIAGNOSIS — I7121 Aneurysm of the ascending aorta, without rupture: Secondary | ICD-10-CM

## 2017-03-15 DIAGNOSIS — I712 Thoracic aortic aneurysm, without rupture: Secondary | ICD-10-CM | POA: Diagnosis not present

## 2017-03-15 NOTE — Progress Notes (Signed)
[address]       [place] [address]             [phone]                    Tillman H Ramroop S[facility]y Medical Record #[id number] Date of Birth: [birth year]  Referring: [name], MD Primary Care: Horatio Pel, MD  Chief Complaint:    Chief Complaint  Patient presents with  . Thoracic Aortic Aneurysm    Surgical eval, Chest CT 01/14/17    History of Present Illness:    Christian Weeks 66 y.o. male is seen in the office  today for Dilatation of the ascending aorta found on CT scan. In early 2017 the patient underwent a left total knee replacement, following this he had a transient neurologic deficit with trouble with speech. This resolved fairly quickly. Echocardiogram did not confirm a patent foramen on bubble test, transcranial Doppler did. At the time the patient was noted to have a left lower extremity DVT. He notes he was anticoagulated for one month.   He returned in early 2018 to have the right knee replaced, on a preop chest x-ray it was suggested that he had a descending thoracic aneurysm. A CT of the chest was performed, there was no descending thoracic aneurysm there was mild dilatation of the ascending aorta, 4.7 cm.  The patient has no family history of aortic aneurysm, Marfan's or other connective tissue disorder, there is no family history of young family members dying suddenly without known cause. Patient notes his mother died of congestive heart failure at age 42 father died at age 52 possibly from a myocardial infarction. One sister died at age 27 with severe COPD, he has to other brothers 81 and 78 and a sister age 23 for left.  Current Activity/ Functional Status:  Patient is independent with mobility/ambulation, transfers, ADL's, IADL's.   Zubrod Score: At the time of surgery this patient's most appropriate activity status/level should be described as: []     0    Normal activity, no symptoms [x]     1    Restricted in physical  strenuous activity but ambulatory, able to do out light work []     2    Ambulatory and capable of self care, unable to do work activities, up and about               >50 % of waking hours                              []     3    Only limited self care, in bed greater than 50% of waking hours []     4    Completely disabled, no self care, confined to bed or chair []     5    Moribund   Past Medical History:  Diagnosis Date  . Arthritis   . Blood clot in vein 01/2016  . Degenerative joint disease   . History of kidney stones   . Hypertension   . Hypothyroidism   . Stroke ([facility]) 01/2016   speech difficulty for a few hours  . Wears dentures   . Wears glasses     Past Surgical History:  Procedure Laterality Date  . KNEE ARTHROPLASTY Left 01/15/2016   Procedure: COMPUTER ASSISTED TOTAL KNEE ARTHROPLASTY;  Surgeon: [name], MD;  Location: [facility];  Service: Orthopedics;  Laterality: Left;  . TOTAL KNEE ARTHROPLASTY Right 01/13/2017   Procedure: RIGHT TOTAL KNEE ARTHROPLASTY;  Surgeon: [name], MD;  Location: [facility];  Service: Orthopedics;  Laterality: Right;    Family History  Problem Relation Age of Onset  . Congestive Heart Failure Mother   . Diabetes Mother   . Thyroid disease Sister     Social History   Social History  . Marital status: Widowed- Patient's wife died in 04-09-16     Spouse name: N/A  . Number of children: N/A  . Years of education: N/A   Occupational History  . Not on file.   Social History Main Topics  . Smoking status: Former Smoker    Quit date: 11/16/1996  . Smokeless tobacco: Never Used  . Alcohol use No  . Drug use: No  . Sexual activity: Not on file      History  Smoking Status  . Former Smoker  . Quit date: 11/16/1996  Smokeless Tobacco  . Never Used    History  Alcohol Use No     No Known Allergies  Current Outpatient Prescriptions  Medication Sig Dispense Refill  . acetaminophen (TYLENOL) 500 MG tablet Take 1,000 mg by mouth  every 6 (six) hours as needed for moderate pain.    Marland Kitchen apixaban (ELIQUIS) 2.5 MG TABS tablet Take 1 tablet (2.5 mg total) by mouth every 12 (twelve) hours. (Patient not taking: Reported on 02/21/2017) 60 tablet 0  . Cholecalciferol (VITAMIN D3) 3000 units TABS Take 3,000 Units by mouth daily.    Marland Kitchen levothyroxine (SYNTHROID, LEVOTHROID) 137 MCG tablet Take 137 mcg by mouth daily before breakfast.    . losartan (COZAAR) 100 MG tablet Take 50 mg by mouth daily.     No current facility-administered medications for this visit.       Review of Systems:     Cardiac Review of Systems: Y or N  Chest Pain [ n   ]  Resting SOB [ n  ] Exertional SOB  [ n ]  Orthopnea [ n ]   Pedal Edema [n   ]    Palpitations [n  ] Syncope  Florencio.Farrier  ]   Presyncope [ n  ]  General Review of Systems: [Y] = yes [  ]=no Constitional: recent weight change [  ];  Wt loss over the last 3 months [   ] anorexia [  ]; fatigue [  ]; nausea [  ]; night sweats [  ]; fever [  ]; or chills [  ];          Dental: poor dentition[  ]; Last Dentist visit:   Eye : blurred vision [  ]; diplopia [   ]; vision changes [  ];  Amaurosis fugax[  ]; Resp: cough [  ];  wheezing[  ];  hemoptysis[  ]; shortness of breath[  ]; paroxysmal nocturnal dyspnea[  ]; dyspnea on exertion[  ]; or orthopnea[  ];  GI:  gallstones[  ], vomiting[  ];  dysphagia[  ]; melena[  ];  hematochezia [  ]; heartburn[  ];   Hx of  Colonoscopy[y  ]; GU: kidney stones [  ]; hematuria[  ];   dysuria [  ];  nocturia[  ];  history of     obstruction [  ]; urinary frequency [  ]             Skin: rash, swelling[  ];, hair loss[  ];  peripheral edema[  ];  or itching[  ]; Musculosketetal: myalgias[  ];  joint swelling[  ];  joint erythema[  ];  joint pain[  ];  back pain[  ];  Heme/Lymph: bruising[  ];  bleeding[  ];  anemia[  ];  Neuro: TIA[  ];  headaches[  ];  stroke[y  ];  vertigo[  ];  seizures[  ];   paresthesias[  ];  difficulty walking[some  ];  Psych:depression[  ]; anxiety[   ];  Endocrine: diabetes[  ];  thyroid dysfunction[  ];  Immunizations: Flu up to date Blue.Reese  ]; Pneumococcal up to date Blue.Reese  ];  Other:  Physical Exam: BP 118/88   Pulse 64   Resp 20   Ht 6' (1.829 m)   Wt 240 lb (108.9 kg)   SpO2 99% Comment: RA  BMI 32.55 kg/m   PHYSICAL EXAMINATION: General appearance: alert, cooperative, appears stated age and no distress Head: Normocephalic, without obvious abnormality, atraumatic Neck: no adenopathy, no carotid bruit, no JVD, supple, symmetrical, trachea midline and thyroid not enlarged, symmetric, no tenderness/mass/nodules Lymph nodes: Cervical, supraclavicular, and axillary nodes normal. Resp: clear to auscultation bilaterally Back: symmetric, no curvature. ROM normal. No CVA tenderness. Cardio: regular rate and rhythm, S1, S2 normal, no murmur, click, rub or gallop GI: soft, non-tender; bowel sounds normal; no masses,  no organomegaly Extremities: extremities normal, atraumatic, no cyanosis or edema and Homans sign is negative, no sign of DVT Neurologic: Grossly normal Palpable DP and PT pulses bilaterally  Diagnostic Studies & Laboratory data:     Recent Radiology Findings:   CLINICAL DATA:  Unexplained retrocardiac density on a recent lateral chest radiograph view. Inpatient.  EXAM: CT CHEST WITH CONTRAST  TECHNIQUE: Multidetector CT imaging of the chest was performed during intravenous contrast administration.  CONTRAST:  47mL ISOVUE-300 IOPAMIDOL (ISOVUE-300) INJECTION 61%  COMPARISON:  01/04/2017 chest radiograph.  FINDINGS: Cardiovascular: Top-normal heart size. No significant pericardial fluid/thickening. Left anterior descending coronary atherosclerosis. Atherosclerotic and tortuous thoracic aorta with aneurysmal 4.7 cm maximum diameter ascending thoracic aorta. Normal caliber pulmonary arteries. No central pulmonary emboli.  Mediastinum/Nodes: Heterogeneous and atrophic appearing thyroid gland without discrete  thyroid nodules. Unremarkable esophagus. No pathologically enlarged axillary, mediastinal or hilar lymph nodes.  Lungs/Pleura: No pneumothorax. No pleural effusion. There are four scattered solid pulmonary nodules, largest 5 mm in the right middle lobe associated with the minor fissure (series 3/image 79) and 5 mm in the lingula (series 3/ image 100). No acute consolidative airspace disease, lung masses or additional significant pulmonary nodules. Mild centrilobular and paraseptal emphysema.  Upper abdomen: Unremarkable.  Musculoskeletal: No aggressive appearing focal osseous lesions. Moderate thoracic spondylosis.  IMPRESSION: 1. Retrocardiac density on the lateral chest radiograph view correlates with focal tortuosity of the descending thoracic aorta. No lung masses. No consolidative airspace disease. 2. Four scattered solid pulmonary nodules, largest 5 mm. No follow-up needed if patient is low-risk (and has no known or suspected primary neoplasm). Non-contrast chest CT can be considered in 12 months if patient is high-risk. This recommendation follows the consensus statement: Guidelines for Management of Incidental Pulmonary Nodules Detected on CT Images: From the Fleischner Society 2017; Radiology 2017; 284:228-243. 3. Aortic atherosclerosis. Ascending thoracic aortic aneurysm, maximum diameter 4.7 cm. Ascending thoracic aortic aneurysm. Recommend semi-annual imaging followup by CTA or MRA and referral to cardiothoracic surgery if not already obtained. This recommendation follows 2010 ACCF/AHA/AATS/ACR/ASA/SCA/SCAI/SIR/STS/SVM Guidelines for the Diagnosis and Management of Patients With Thoracic Aortic Disease. Circulation. 2010; 121: C585-I778.  4. Mild emphysema.  I have independently reviewed the above radiology studies  and reviewed the findings with the patient.    Recent Lab Findings: Lab Results  Component Value Date   WBC 8.8 01/15/2017   HGB 11.6 (L)  01/15/2017   HCT 33.4 (L) 01/15/2017   PLT 142 (L) 01/15/2017   GLUCOSE 126 (H) 01/14/2017   CHOL 141 02/05/2016   TRIG 300 (H) 02/05/2016   HDL 26 (L) 02/05/2016   LDLCALC 55 02/05/2016   ALT 18 11/16/2016   AST 23 11/16/2016   NA 137 01/14/2017   K 4.5 01/14/2017   CL 105 01/14/2017   CREATININE 1.20 01/14/2017   BUN 12 01/14/2017   CO2 25 01/14/2017   TSH 5.938 (H) 02/06/2016   INR 1.04 11/16/2016   HGBA1C 5.4 02/05/2016   ECHO:01/2016 LV EF: 50% -   55%  ------------------------------------------------------------------- Indications:      CVA 436.  ------------------------------------------------------------------- Study Conclusions  - Left ventricle: The cavity size was normal. Wall thickness was   normal. Systolic function was normal. The estimated ejection   fraction was in the range of 50% to 55%. Wall motion was normal;   there were no regional wall motion abnormalities. Doppler   parameters are consistent with abnormal left ventricular   relaxation (grade 1 diastolic dysfunction). - Aortic root: The aortic root was mildly dilated. - Mitral valve: Calcified annulus.  Impressions:  - Technically difficult; LV function low normal to mildly reduced   (EF 50); saline microcavitation study with late bubbles felt to   be nondiagnostic; if clinically indicated, TEE would have greater   sensitivity for source of embolus.  Transthoracic echocardiography.  M-mode, complete 2D, spectral Doppler, and color Doppler.  Birthdate:  Patient birthdate: [birth year].  Age:  Patient is 66 yr old.  Sex:  Gender: male. BMI: 35.8 kg/m^2.  Blood pressure:     123/94  Patient status: Inpatient.  Study date:  Study date: 02/07/2016. Study time: 10:04 AM.  Location:  Bedside.  -------------------------------------------------------------------  ------------------------------------------------------------------- Left ventricle:  The cavity size was normal. Wall thickness  was normal. Systolic function was normal. The estimated ejection fraction was in the range of 50% to 55%. Wall motion was normal; there were no regional wall motion abnormalities. Doppler parameters are consistent with abnormal left ventricular relaxation (grade 1 diastolic dysfunction).  ------------------------------------------------------------------- Aortic valve:   Trileaflet; mildly calcified leaflets. Mobility was not restricted.  Doppler:  Transvalvular velocity was within the normal range. There was no stenosis. There was no regurgitation.   ------------------------------------------------------------------- Aorta:  Aortic root: The aortic root was mildly dilated.  ------------------------------------------------------------------- Mitral valve:   Calcified annulus. Mobility was not restricted. Doppler:  Transvalvular velocity was within the normal range. There was no evidence for stenosis. There was no regurgitation.  ------------------------------------------------------------------- Left atrium:  The atrium was normal in size.  ------------------------------------------------------------------- Right ventricle:  The cavity size was normal. Systolic function was normal.  ------------------------------------------------------------------- Pulmonic valve:    Doppler:  Transvalvular velocity was within the normal range. There was no evidence for stenosis.  ------------------------------------------------------------------- Tricuspid valve:   Structurally normal valve.    Doppler: Transvalvular velocity was within the normal range. There was trivial regurgitation.  ------------------------------------------------------------------- Pulmonary artery:   Systolic pressure was within the normal range.   ------------------------------------------------------------------- Right atrium:  The atrium was normal in  size.  ------------------------------------------------------------------- Pericardium:  There was no pericardial effusion.  ------------------------------------------------------------------- Systemic veins: Inferior vena cava: The vessel was normal in size.  ------------------------------------------------------------------- Measurements  Left ventricle                         Value        Reference  LV ID, ED, PLAX chordal        (H)     52.3  mm     43 - 52  LV ID, ES, PLAX chordal        (H)     43.2  mm     23 - 38  LV fx shortening, PLAX chordal (L)     17    %      >=29  LV PW thickness, ED                    10.2  mm     ---------  IVS/LV PW ratio, ED                    1.04         <=1.3  LV ejection fraction, 1-p A4C          47    %      ---------  LV e&', lateral                         12.4  cm/s   ---------  LV E/e&', lateral                       4.73         ---------  LV e&', medial                          6.85  cm/s   ---------  LV E/e&', medial                        8.57         ---------  LV e&', average                         9.63  cm/s   ---------  LV E/e&', average                       6.1          ---------    Ventricular septum                     Value        Reference  IVS thickness, ED                      10.6  mm     ---------    LVOT                                   Value        Reference  LVOT ID, S                             23    mm     ---------  LVOT area  4.15  cm^2   ---------    Aorta                                  Value        Reference  Aortic root ID, ED                     38    mm     ---------    Left atrium                            Value        Reference  LA ID, A-P, ES                         38    mm     ---------  LA ID/bsa, A-P                         1.51  cm/m^2 <=2.2  LA volume, ES, 1-p A4C                 45.9  ml     ---------  LA volume/bsa, ES, 1-p A4C             18.3  ml/m^2  ---------    Mitral valve                           Value        Reference  Mitral E-wave peak velocity            58.7  cm/s   ---------  Mitral A-wave peak velocity            67.4  cm/s   ---------  Mitral deceleration time       (H)     313   ms     150 - 230  Mitral E/A ratio, peak                 0.9          ---------    Systemic veins                         Value        Reference  Estimated CVP                          3     mm Hg  ---------    Right ventricle                        Value        Reference  TAPSE                                  15.3  mm     ---------  RV s&', lateral, S                      11.7  cm/s   ---------  Legend: (L)  and  (H)  mark values outside specified reference range.  ------------------------------------------------------------------- Prepared and Electronically  Authenticated by  Kirk Ruths 2017-02-12T15:08:13    [facility]*                         [address], [place] [address]                            [phone]  ------------------------------------------------------------------- Noninvasive Vascular Lab  Transcranial Duplex Study  Patient:    Harland, Aguiniga MR #:       [id number] Study Date: 02/07/2016 Gender:     M Age:        31 Height: Weight: BSA: Pt. Status: Room:       5C16C   ATTENDING    Eliezer Bottom [id number]  SONOGRAPHER  Landry Mellow, RVT, RDMS  Rachel Bo  Teressa Senter  Reports also to:  ------------------------------------------------------------------- History and indications:  Indications  Stroke patient with lower extremity DVT.  ------------------------------------------------------------------- Study information:  Study status:  Routine.  Procedure:  A vascular evaluation was performed. The right ICA, right ophthalmic, right  middle cerebral, right anterior cerebral, right posterior cerebral, left ICA, left ophthalmic, left middle cerebral, left anterior cerebral, and left posterior cerebral arteries were studied. Image quality was excellent.    Transcranial duplex study.  Birthdate:  Patient birthdate: 1951-02-13.  Age:  Patient is 66 yr old.  Sex:  Gender: male.  Study date:  Study date: 02/07/2016. Study time: 02:36 PM. Location:  Vascular laboratory.  Patient status:  Inpatient.   ------------------------------------------------------------------- Summary:  - Transcranial Doppler Bubble study.   Performed by Dr. Erlinda Hong.     Verbal consent taken and risks/ benefits explained.     Left forearm IV was used for procedure.   Right Middle Cerebral artery was insonated.     HITS heard at rest: Spencer degree I   HITS heard during valsalva: Spencer degree IV -V     PFO size: small size at rest, large size during valsalva. - Postive Transcranial Doppler study indicative of a large right to   left intracardiac shunt with valsalava manouvre.  Prepared and Electronically Authenticated by  Antony Contras MD 2017-02-14T08:34:19   Aortic Size Index=   4.7      /Body surface area is 2.35 meters squared. = 2.0  < 2.75 cm/m2      4% risk per year 2.75 to 4.25          8% risk per year > 4.25 cm/m2    20% risk per year  cross sectional area of aorta cm2/height in meters > 10 consider  surgery  Assessment / Plan:   Aortic atherosclerosis. Ascending thoracic aortic aneurysm, maximum diameter 4.7 cm. Ascending thoracic aortic aneurysm. Trileaflet-  Aortic Valve  Stroke without residual 01/2016  PFO- by transcranial doppler  History of left leg DVT Bilateral knee replacements  I reviewed with the patient the findings of his recent CT scan of the chest, he has no other chest CTs to compare for the size of the aorta. I discussed with him the diagnosis, the need to have good blood  pressure control, ideally with  beta blocker included, and to avoid strenuous lifting or straining associated with Valsalva.   We will plan a follow-up CTA of the chest to measure the aortic size in 6 months if this is stable we will spread the surveillance period  Out to  longer interval.  I  spent 40 minutes counseling the patient face to face and 50% or more the  time was spent in counseling and coordination of care. The total time spent in the appointment was 60 minutes.  Grace Isaac MD      [address] [place] [address] Office [phone]   Beeper [phone]  03/15/2017 3:58 PM

## 2017-03-15 NOTE — Patient Instructions (Signed)
It's best to avoid activities that cause grunting or straining (medically referred to as a "valsalva maneuver"). This happens when a person bears down against a closed throat to increase the strength of arm or abdominal muscles. There's often a tendency to do this when lifting heavy weights, doing sit-ups, push-ups or chin-ups, etc., but it may be harmful.    Thoracic Aortic Aneurysm An aneurysm is a bulge in an artery. It happens when blood pushes up against a weakened or damaged artery wall. A thoracic aortic aneurysm is an aneurysm that occurs in the first part of the aorta, between the heart and the diaphragm. The aorta is the main artery of the body. It supplies blood from the heart to the rest of the body. Some aneurysms may not cause symptoms or problems. However, the major concern with a thoracic aortic aneurysm is that it can enlarge and burst (rupture), or blood can flow between the layers of the wall of the aorta through a tear (aorticdissection). Both of these conditions can cause bleeding inside the body and can be life-threatening if they are not diagnosed and treated right away. What are the causes? The exact cause of this condition is not known. What increases the risk? The following factors may make you more likely to develop this condition:  Being age 17 or older.  Having a hardening of the arteries caused by the buildup of fat and other substances in the lining of a blood vessel (arteriosclerosis).  Having inflammation of the walls of an artery (arteritis).  Having a genetic disease that weakens the body's connective tissue, such as Marfan syndrome.  Having an injury or trauma to the aorta.  Having an infection that is caused by bacteria, such as syphilis or staphylococcus, in the wall of the aorta (infectious aortitis).  Having high blood pressure (hypertension).  Being male.  Being white (Caucasian).  Having high cholesterol.  Having a family history of  aneurysms.  Using tobacco.  Having chronic obstructive pulmonary disease (COPD). What are the signs or symptoms? Symptoms of this condition vary depending on the size and rate of growth of the aneurysm. Most grow slowly and do not cause any symptoms. When symptoms do occur, they may include:  Pain in the chest, back, sides, or abdomen. The pain may vary in intensity. A sudden onset of severe pain may indicate that the aneurysm has ruptured.  Hoarseness.  Cough.  Shortness of breath.  Swallowing problems.  Swelling in the face, arms, or legs.  Fever.  Unexplained weight loss. How is this diagnosed? This condition may be diagnosed with:  An ultrasound.  X-rays.  A CT scan.  An MRI.  Tests to check the arteries for damage or blockages (angiogram). Most unruptured thoracic aortic aneurysms cause no symptoms, so they are often found during exams for other conditions. How is this treated? Treatment for this condition depends on:  The size of the aneurysm.  How fast the aneurysm is growing.  Your age.  Risk factors for rupture. Aneurysms that are smaller than 2.2 inches (5.5 cm) may be managed by using medicines to control blood pressure, manage pain, or fight infection. You may need regular monitoring to see if the aneurysm is getting bigger. Your health care provider may recommend that you have an ultrasound every year or every 6 months. How often you need to have an ultrasound depends on the size of the aneurysm, how fast it is growing, and whether you have a family history of aneurysms.  Surgical repair may be needed if your aneurysm is larger than 2.2 inches or if it is growing quickly. Follow these instructions at home: Eating and drinking   Eat a healthy diet. Your health care provider may recommend that you:  Lower your salt (sodium) intake. In some people, too much salt can raise blood pressure and increase the risk of thoracic aortic aneurysm.  Avoid foods  that are high in saturated fat and cholesterol, such as red meat and dairy.  Eat a diet that is low in sugar.  Increase your fiber intake by including whole grains, vegetables, and fruits in your diet. Eating these foods may help to lower blood pressure.  Limit or avoid alcohol as recommended by your health care provider. Lifestyle   Follow instructions from your health care provider about healthy lifestyle habits. Your health care provider may recommend that you:  Do not use any products that contain nicotine or tobacco, such as cigarettes and e-cigarettes. If you need help quitting, ask your health care provider.  Keep your blood pressure within normal limits. The target limit for most people is below 120/80. Check your blood pressure regularly. If it is high, ask your health care provider about ways that you can control it.  Keep your blood sugar (glucose) level and cholesterol levels within normal limits. Target limits for most people are:  Blood glucose level: Less than 100 mg/dL.  Total cholesterol level: Less than 200 mg/dL.  Maintain a healthy weight. Activity   Stay physically active and exercise regularly. Talk with your health care provider about how often you should exercise and ask which types of exercise are safe for you.  Avoid heavy lifting and activities that take a lot of effort (are strenuous). Ask your health care provider what activities are safe for you. General instructions   Keep all follow-up visits as told by your health care provider. This is important.  Talk with your health care provider about regular screenings to see if the aneurysm is getting bigger.  Take over-the-counter and prescription medicines only as told by your health care provider. Contact a health care provider if:  You have discomfort in your upper back, neck, or abdomen.  You have trouble swallowing.  You have a cough or hoarseness.  You have a family history of aneurysms.  You  have unexplained weight loss. Get help right away if:  You have sudden, severe pain in your upper back and abdomen. This pain may move into your chest and arms.  You have shortness of breath.  You have a fever. This information is not intended to replace advice given to you by your health care provider. Make sure you discuss any questions you have with your health care provider. Document Released: 12/12/2005 Document Revised: 09/23/2016 Document Reviewed: 09/23/2016 Elsevier Interactive Patient Education  2017 [employer].   Aortic Dissection An aortic dissection happens when there is a tear in the main blood vessel of the body (aorta). The aorta comes out of the heart, curves around, and then goes down the chest (thoracic aorta) and into the abdomen (abdominal aorta) to supply arteries with blood. The wall of the aorta has inner and outer layers. Aortic dissection occurs most often in the thoracic aorta. As the tear widens and blood flows through it, the aorta becomes "double-barreled." This means that one part of the aorta continues to carry blood to the body, but blood also flows into the tear, between the layers of the aorta. The torn part  of the aorta fills with blood and swells up. This can reduce blood flow through the part of the aorta that is still supplying blood to the body. Aortic dissection is a medical emergency. What are the causes? An aortic dissection is commonly caused by weakening of the artery wall due to high blood pressure. Other causes may include:  An injury, such as from a car crash.  Birth defects that affect the heart (congenital heart defects).  Thickening of the artery walls. In some cases, the cause is not known. What increases the risk? The following factors may make you more likely to develop this condition:  Having certain medical conditions, such as:  High blood pressure (hypertension).  Hardening and narrowing of the arteries  (atherosclerosis).  A genetic disorder that affects the connective tissue, such as Marfan syndrome or Ehlers-Danlos syndrome.  A condition that causes inflammation of blood vessels, such as giant cell arteritis.  Having a chest injury.  Having surgery on the aorta.  Being born with a congenital heart defect.  Being male.  Being older than age 23.  Using cocaine.  Smoking.  Lifting heavy weights or doing other types of high-intensity resistance training. What are the signs or symptoms? Signs and symptoms of aortic dissection start suddenly. The most common symptoms are:  Severe chest pain that may feel like tearing, stabbing, or sharp pain.  Severe pain that spreads (radiates) to the back, neck, jaw, or abdomen. Other symptoms may include:  Trouble breathing.  Dizziness or fainting.  Sudden weakness on one side of the body.  Nausea or vomiting.  Trouble swallowing.  Coughing up blood.  Vomiting blood.  Clammy skin. How is this diagnosed? This condition may be diagnosed based on:  Your symptoms.  A physical exam. This may include:  Listening for abnormal blood flow sounds (murmurs) in your chest or abdomen.  Checking your pulse in your arms and legs.  Checking your blood pressure to see whether it is low or whether there is a difference between the measurements in your right and left arm.  Electrocardiogram (ECG). This test measures the electrical activity in your heart.  Chest X-ray.  CT scan.  MRI.  Aortic angiogram. This test involves injecting dye to make it easier to see your blood vessels clearly.  Echocardiogram to study your heart using sound waves.  Blood tests. How is this treated? It is important to treat an aortic dissection as quickly as possible. Treatment may start as soon as your health care provider thinks that you have aortic dissection. Treatment depends on the location and severity of your dissection and your overall health.  Treatment may include:  Medicines to lower your blood pressure.  Surgery to repair the dissected part of your aorta with artificial material (syntheticgraft).  A medical procedure to insert a stent-graft into the aorta (endovascular procedure). During this procedure, a long, thin tube (stent) is inserted into an artery near the groin (femoral artery) and moved up to the damaged part of the aorta. Then, the stent is opened to help improve blood flow and prevent future dissection. Follow these instructions at home: Activity   Avoid activities that could injure your chest or your abdomen. Ask your health care provider what activities are safe for you.  After you have recovered, try to stay active. Ask your health care provider what activities are safe for you after recovery.  Do not lift anything that is heavier than 10 lb (4.5 kg) until your health care provider  approves.  Do not drive or use heavy machinery while taking prescription pain medicine. Eating and drinking   Eat a heart-healthy diet, which includes lots of fresh fruits and vegetables, low-fat (lean) protein, and whole grains.  Check ingredients and nutrition facts on packaged foods and beverages, and avoid foods with high amounts of:  Salt (sodium).  Saturated fats (like red meat).  Trans fats (like fried food). General instructions   Take over-the-counter and prescription medicines only as told by your health care provider.  Work with your health care provider to manage your blood pressure.  Talk with your health care provider about how to manage stress.  Do not use any products that contain nicotine or tobacco, such as cigarettes and e-cigarettes. If you need help quitting, ask your health care provider.  Keep all follow-up visits as told by your health care provider. This is important. Get help right away if:  You develop any symptoms of aortic dissection after treatment, including severe pain in your chest,  back, or abdomen.  You have a pain in your abdomen.  You have trouble breathing or you develop a cough.  You faint.  You develop a racing heartbeat. These symptoms may represent a serious problem that is an emergency. Do not wait to see if the symptoms will go away. Get medical help right away. Call your local emergency services (911 in the [place] Do not drive yourself to the hospital. Summary  An aortic dissection happens when there is a tear in the main blood vessel of the body (aorta). It is a medical emergency.  The most common symptom is severe pain in the chest that spreads (radiates) to the back, neck, jaw, or abdomen.  It is important to treat an aortic dissection as quickly as possible. Treatment typically includes surgery and medicines. This information is not intended to replace advice given to you by your health care provider. Make sure you discuss any questions you have with your health care provider. Document Released: 03/20/2008 Document Revised: 10/31/2016 Document Reviewed: 10/31/2016 Elsevier Interactive Patient Education  2017 [employer].

## 2017-03-28 ENCOUNTER — Ambulatory Visit (INDEPENDENT_AMBULATORY_CARE_PROVIDER_SITE_OTHER): Payer: Medicare HMO | Admitting: Orthopaedic Surgery

## 2017-03-28 ENCOUNTER — Encounter (INDEPENDENT_AMBULATORY_CARE_PROVIDER_SITE_OTHER): Payer: Self-pay | Admitting: Orthopaedic Surgery

## 2017-03-28 VITALS — BP 117/88 | HR 76 | Ht 72.0 in | Wt 240.0 lb

## 2017-03-28 DIAGNOSIS — Z96651 Presence of right artificial knee joint: Secondary | ICD-10-CM

## 2017-03-28 NOTE — Patient Instructions (Signed)
Must avoid doing sit ups and any heavy lifting per Dr. Lurline Del office  Okay to continue home exercises for right knee but again no heavy resistance her Dr. Lurline Del office.

## 2017-03-28 NOTE — Progress Notes (Signed)
Post-Op Visit Note   Patient: Christian Weeks           Date of Birth: 07-Mar-1951           MRN: [id number] Visit Date: 03/28/2017 PCP: Horatio Pel, MD   Assessment & Plan:  Chief Complaint:  Chief Complaint  Patient presents with  . Right Knee - Routine Post Op   Visit Diagnoses:  1. Status post total knee replacement, right     Plan: I did speak with Ryan Dr. Lurline Del assistant.  She paged him and advised [place] that patient should discontinue sit ups which he has been doing. He can continue doing home exercises for his right knee but will avoid heavy resistance. Stressed to patient the importance of being compliant with these instructions. He will follow-up with [place] in 4 months for recheck of his right knee. Advised that if he has any signs of chest pain, shortness of breath or any other symptoms that have been previously discussed with Dr. Roxy Horseman that he should go immediately to the emergency room. Patient and his wife voiced understanding.  Follow-Up Instructions: Return in about 4 months (around 07/28/2017).   Orders:  No orders of the defined types were placed in this encounter.  No orders of the defined types were placed in this encounter.  HPI Patient returns for one month follow up right knee. He is status post right total knee arthroplasty on 01/13/2017. He is 10 weeks and 4 days post op. He is doing well. He continues to work on extension. He states that the knee is just a little sore, but he is able to walk a lot better than before. He did have appointment with Dr. Jobie Quaker in regards to thoracic aneurysm.  Dr. Roxy Horseman advised patient to avoid any heavy lifting or strenuous activity. Also avoid anything that may cause Valsalva maneuvers  Imaging: No results found.  PMFS History: Patient Active Problem List   Diagnosis Date Noted  . Thoracic aortic aneurysm without rupture ([place]) 02/21/2017  . Arthritis of right knee 01/13/2017  . Unilateral primary  osteoarthritis, right knee 10/25/2016  . DVT (deep venous thrombosis), left 04/13/2016  . Cerebrovascular accident (CVA) due to embolism of cerebral artery ([place]) 04/13/2016  . S/P knee surgery 04/13/2016  . PFO (patent foramen ovale)   . HLD (hyperlipidemia)   . Acute CVA (cerebrovascular accident) ([place]) 02/06/2016  . TIA (transient ischemic attack) 02/05/2016  . Stroke ([place]) 02/05/2016  . Hypothyroidism   . Arthritis   . Calf pain   . S/P total knee arthroplasty, right 01/15/2016   Past Medical History:  Diagnosis Date  . Arthritis   . Blood clot in vein 01/2016  . Degenerative joint disease   . History of kidney stones   . Hypertension   . Hypothyroidism   . Stroke ([place]) 01/2016   speech difficulty for a few hours  . Wears dentures   . Wears glasses     Family History  Problem Relation Age of Onset  . Congestive Heart Failure Mother   . Diabetes Mother   . Thyroid disease Sister     Past Surgical History:  Procedure Laterality Date  . KNEE ARTHROPLASTY Left 01/15/2016   Procedure: COMPUTER ASSISTED TOTAL KNEE ARTHROPLASTY;  Surgeon: [name], MD;  Location: [facility];  Service: Orthopedics;  Laterality: Left;  . TOTAL KNEE ARTHROPLASTY Right 01/13/2017   Procedure: RIGHT TOTAL KNEE ARTHROPLASTY;  Surgeon: [name], MD;  Location: [facility];  Service:  Orthopedics;  Laterality: Right;   Social History   Occupational History  . Not on file.   Social History Main Topics  . Smoking status: Former Smoker    Quit date: 11/16/1996  . Smokeless tobacco: Never Used  . Alcohol use No  . Drug use: No  . Sexual activity: Not on file   Exam Right knee range of motion about 3-120. Good quad strength. Knee nontender.

## 2017-04-04 DIAGNOSIS — Z79899 Other long term (current) drug therapy: Secondary | ICD-10-CM | POA: Diagnosis not present

## 2017-04-04 DIAGNOSIS — E669 Obesity, unspecified: Secondary | ICD-10-CM | POA: Diagnosis not present

## 2017-04-04 DIAGNOSIS — E039 Hypothyroidism, unspecified: Secondary | ICD-10-CM | POA: Diagnosis not present

## 2017-04-04 DIAGNOSIS — Z6832 Body mass index (BMI) 32.0-32.9, adult: Secondary | ICD-10-CM | POA: Diagnosis not present

## 2017-04-04 DIAGNOSIS — I10 Essential (primary) hypertension: Secondary | ICD-10-CM | POA: Diagnosis not present

## 2017-04-04 DIAGNOSIS — Z87891 Personal history of nicotine dependence: Secondary | ICD-10-CM | POA: Diagnosis not present

## 2017-04-04 DIAGNOSIS — Z0000 Encounter for general adult medical examination without abnormal findings: Secondary | ICD-10-CM | POA: Diagnosis not present

## 2017-04-04 DIAGNOSIS — K08109 Complete loss of teeth, unspecified cause, unspecified class: Secondary | ICD-10-CM | POA: Diagnosis not present

## 2017-04-04 DIAGNOSIS — Z972 Presence of dental prosthetic device (complete) (partial): Secondary | ICD-10-CM | POA: Diagnosis not present

## 2017-05-30 DIAGNOSIS — L729 Follicular cyst of the skin and subcutaneous tissue, unspecified: Secondary | ICD-10-CM | POA: Diagnosis not present

## 2017-05-30 DIAGNOSIS — L089 Local infection of the skin and subcutaneous tissue, unspecified: Secondary | ICD-10-CM | POA: Diagnosis not present

## 2017-05-30 DIAGNOSIS — N529 Male erectile dysfunction, unspecified: Secondary | ICD-10-CM | POA: Diagnosis not present

## 2017-06-23 DIAGNOSIS — L723 Sebaceous cyst: Secondary | ICD-10-CM | POA: Diagnosis not present

## 2017-07-26 ENCOUNTER — Ambulatory Visit (INDEPENDENT_AMBULATORY_CARE_PROVIDER_SITE_OTHER): Payer: Medicare HMO | Admitting: Orthopaedic Surgery

## 2017-08-10 ENCOUNTER — Other Ambulatory Visit: Payer: Self-pay | Admitting: *Deleted

## 2017-08-10 DIAGNOSIS — I712 Thoracic aortic aneurysm, without rupture, unspecified: Secondary | ICD-10-CM

## 2017-08-15 DIAGNOSIS — I10 Essential (primary) hypertension: Secondary | ICD-10-CM | POA: Diagnosis not present

## 2017-08-15 DIAGNOSIS — E039 Hypothyroidism, unspecified: Secondary | ICD-10-CM | POA: Diagnosis not present

## 2017-08-15 DIAGNOSIS — Z125 Encounter for screening for malignant neoplasm of prostate: Secondary | ICD-10-CM | POA: Diagnosis not present

## 2017-08-15 DIAGNOSIS — E559 Vitamin D deficiency, unspecified: Secondary | ICD-10-CM | POA: Diagnosis not present

## 2017-08-15 DIAGNOSIS — E785 Hyperlipidemia, unspecified: Secondary | ICD-10-CM | POA: Diagnosis not present

## 2017-09-19 DIAGNOSIS — M859 Disorder of bone density and structure, unspecified: Secondary | ICD-10-CM | POA: Diagnosis not present

## 2017-09-19 DIAGNOSIS — Z1159 Encounter for screening for other viral diseases: Secondary | ICD-10-CM | POA: Diagnosis not present

## 2017-09-19 DIAGNOSIS — M8580 Other specified disorders of bone density and structure, unspecified site: Secondary | ICD-10-CM | POA: Diagnosis not present

## 2017-09-19 DIAGNOSIS — Z923 Personal history of irradiation: Secondary | ICD-10-CM | POA: Diagnosis not present

## 2017-09-19 DIAGNOSIS — I10 Essential (primary) hypertension: Secondary | ICD-10-CM | POA: Diagnosis not present

## 2017-09-19 DIAGNOSIS — Z0001 Encounter for general adult medical examination with abnormal findings: Secondary | ICD-10-CM | POA: Diagnosis not present

## 2017-09-19 DIAGNOSIS — E032 Hypothyroidism due to medicaments and other exogenous substances: Secondary | ICD-10-CM | POA: Diagnosis not present

## 2017-09-21 ENCOUNTER — Ambulatory Visit (INDEPENDENT_AMBULATORY_CARE_PROVIDER_SITE_OTHER): Payer: Medicare HMO | Admitting: Cardiothoracic Surgery

## 2017-09-21 ENCOUNTER — Encounter: Payer: Self-pay | Admitting: Cardiothoracic Surgery

## 2017-09-21 ENCOUNTER — Ambulatory Visit
Admission: RE | Admit: 2017-09-21 | Discharge: 2017-09-21 | Disposition: A | Discharge status: Home / Self Care | Payer: Medicare HMO | Source: Ambulatory Visit | Attending: Cardiothoracic Surgery | Admitting: Cardiothoracic Surgery

## 2017-09-21 VITALS — BP 97/71 | HR 54 | Resp 16 | Ht 72.0 in | Wt 242.0 lb

## 2017-09-21 DIAGNOSIS — I712 Thoracic aortic aneurysm, without rupture, unspecified: Secondary | ICD-10-CM

## 2017-09-21 DIAGNOSIS — I7121 Aneurysm of the ascending aorta, without rupture: Secondary | ICD-10-CM

## 2017-09-21 MED ORDER — IOPAMIDOL (ISOVUE-370) INJECTION 76%
80.0000 mL | Freq: Once | INTRAVENOUS | Status: AC | PRN
  Administered 2017-09-21: 80 mL via INTRAVENOUS

## 2017-09-21 NOTE — Progress Notes (Signed)
[address]       [place] [address]             [phone]                    Mazi H [facility] [place] [facility] Medical Record #[id number] Date of Birth: 11-29-1951  Referring: [name], MD Primary Care: Deland Pretty, MD  Chief Complaint:    Chief Complaint  Patient presents with  . TAA    6 month f/u with CTA CHEST    History of Present Illness:    Christian Weeks 66 y.o. male is seen in the office  today for dilation ascending aorta found on CT scan. In early 2017 the patient underwent a left total knee replacement, following this he had a transient neurologic deficit with trouble with speech. This resolved fairly quickly. Echocardiogram did not confirm a patent foramen on bubble test, transcranial Doppler did. At the time the patient was noted to have a left lower extremity DVT. He notes he was anticoagulated for one month.   He returned in early 2018 to have the right knee replaced, on a preop chest x-ray it was suggested that he had a descending thoracic aneurysm. A CT of the chest was performed, there was no descending thoracic aneurysm there was mild dilatation of the ascending aorta, 4.7 cm. echocardiogram showed a trileaflet aortic valve without stenosis or insufficiency.  The patient has no family history of aortic aneurysm, Marfan's or other connective tissue disorder, there is no family history of young family members dying suddenly without known cause. Patient notes his mother died of congestive heart failure at age 18 father died at age 54 possibly from a myocardial infarction. One sister died at age 48 with severe COPD, he has to other brothers 46 and 52 and a sister age 54 for left.   Current Activity/ Functional Status:  Patient is independent with mobility/ambulation, transfers, ADL's, IADL's.   Zubrod Score: At the time of surgery this patient's most appropriate activity status/level should be described as: []     0    Normal activity, no  symptoms [x]     1    Restricted in physical strenuous activity but ambulatory, able to do out light work []     2    Ambulatory and capable of self care, unable to do work activities, up and about               >50 % of waking hours                              []     3    Only limited self care, in bed greater than 50% of waking hours []     4    Completely disabled, no self care, confined to bed or chair []     5    Moribund   Past Medical History:  Diagnosis Date  . Arthritis   . Blood clot in vein 01/2016  . Degenerative joint disease   . History of kidney stones   . Hypertension   . Hypothyroidism   . Stroke ([place]) 01/2016   speech difficulty for a few hours  . Wears dentures   . Wears glasses     Past Surgical History:  Procedure Laterality Date  . KNEE ARTHROPLASTY Left 01/15/2016   Procedure: COMPUTER ASSISTED TOTAL KNEE ARTHROPLASTY;  Surgeon:  [name], MD;  Location: [facility];  Service: Orthopedics;  Laterality: Left;  . TOTAL KNEE ARTHROPLASTY Right 01/13/2017   Procedure: RIGHT TOTAL KNEE ARTHROPLASTY;  Surgeon: [name], MD;  Location: [facility];  Service: Orthopedics;  Laterality: Right;    Family History  Problem Relation Age of Onset  . Congestive Heart Failure Mother   . Diabetes Mother   . Thyroid disease Sister     Social History   Social History  . Marital status: Widowed- Patient's wife died in 2016/04/16     Spouse name: N/A  . Number of children: N/A  . Years of education: N/A   Occupational History  . Not on file.   Social History Main Topics  . Smoking status: Former Smoker    Quit date: 11/16/1996  . Smokeless tobacco: Never Used  . Alcohol use No  . Drug use: No  . Sexual activity: Not on file      History  Smoking Status  . Former Smoker  . Quit date: 11/16/1996  Smokeless Tobacco  . Never Used    History  Alcohol Use No     No Known Allergies  Current Outpatient Prescriptions  Medication Sig Dispense Refill  . acetaminophen  (TYLENOL) 500 MG tablet Take 1,000 mg by mouth every 6 (six) hours as needed for moderate pain.    . Cholecalciferol (VITAMIN D3) 3000 units TABS Take 3,000 Units by mouth daily.    Marland Kitchen levothyroxine (SYNTHROID, LEVOTHROID) 137 MCG tablet Take 137 mcg by mouth daily before breakfast.    . losartan (COZAAR) 100 MG tablet Take 50 mg by mouth daily.     No current facility-administered medications for this visit.       Review of Systems:     Cardiac Review of Systems: Y or N  Chest Pain [ n   ]  Resting SOB [ n  ] Exertional SOB  [ n ]  Orthopnea [ n ]   Pedal Edema [n   ]    Palpitations [n  ] Syncope  Florencio.Farrier  ]   Presyncope [ n  ]  General Review of Systems: [Y] = yes [  ]=no Constitional: recent weight change [  ];  Wt loss over the last 3 months [   ] anorexia [  ]; fatigue [  ]; nausea [  ]; night sweats [  ]; fever [  ]; or chills [  ];          Dental: poor dentition[  ]; Last Dentist visit:   Eye : blurred vision [  ]; diplopia [   ]; vision changes [  ];  Amaurosis fugax[  ]; Resp: cough [  ];  wheezing[  ];  hemoptysis[  ]; shortness of breath[  ]; paroxysmal nocturnal dyspnea[  ]; dyspnea on exertion[  ]; or orthopnea[  ];  GI:  gallstones[  ], vomiting[  ];  dysphagia[  ]; melena[  ];  hematochezia [  ]; heartburn[  ];   Hx of  Colonoscopy[y  ]; GU: kidney stones [  ]; hematuria[  ];   dysuria [  ];  nocturia[  ];  history of     obstruction [  ]; urinary frequency [  ]             Skin: rash, swelling[  ];, hair loss[  ];  peripheral edema[  ];  or itching[  ]; Musculosketetal: myalgias[  ];  joint swelling[  ];  joint erythema[  ];  joint pain[  ];  back pain[  ];  Heme/Lymph: bruising[  ];  bleeding[  ];  anemia[  ];  Neuro: TIA[  ];  headaches[  ];  stroke[y  ];  vertigo[  ];  seizures[  ];   paresthesias[  ];  difficulty walking[some  ];  Psych:depression[  ]; anxiety[  ];  Endocrine: diabetes[  ];  thyroid dysfunction[  ];  Immunizations: Flu up to date Blue.Reese  ]; Pneumococcal up to  date Blue.Reese  ];  Other:  Physical Exam: BP 97/71 (BP Location: Right Arm, Patient Position: Sitting, Cuff Size: Large)   Pulse (!) 54   Resp 16   Ht 6' (1.829 m)   Wt 242 lb (109.8 kg)   SpO2 94% Comment: ON RA  BMI 32.82 kg/m   PHYSICAL EXAMINATION: General appearance: alert, cooperative, appears stated age and no distress Head: Normocephalic, without obvious abnormality, atraumatic Neck: no adenopathy, no carotid bruit, no JVD, supple, symmetrical, trachea midline and thyroid not enlarged, symmetric, no tenderness/mass/nodules Lymph nodes: Cervical, supraclavicular, and axillary nodes normal. Resp: clear to auscultation bilaterally Back: symmetric, no curvature. ROM normal. No CVA tenderness. Cardio: regular rate and rhythm, S1, S2 normal, no murmur, click, rub or gallop GI: soft, non-tender; bowel sounds normal; no masses,  no organomegaly Extremities: extremities normal, atraumatic, no cyanosis or edema and Homans sign is negative, no sign of DVT Neurologic: Grossly normal Full DP and PT pulses bilaterally  Diagnostic Studies & Laboratory data:     Recent Radiology Findings:  Ct Angio Chest Aorta W/cm &/or Wo/cm  Result Date: 09/21/2017 CLINICAL DATA:  Follow-up thoracic aortic aneurysm EXAM: CT ANGIOGRAPHY CHEST WITH CONTRAST TECHNIQUE: Multidetector CT imaging of the chest was performed using the standard protocol during bolus administration of intravenous contrast. Multiplanar CT image reconstructions and MIPs were obtained to evaluate the vascular anatomy. CONTRAST:  80 cc Isovue 370 IV COMPARISON:  01/14/2017 FINDINGS: Cardiovascular: Stable ascending thoracic aorta, measuring up to 4.6 cm compared to 4.7 cm previously. No evidence of dissection. Heart is borderline in size. Scattered aortic and coronary artery calcifications. Mediastinum/Nodes: No mediastinal, hilar, or axillary adenopathy. Lungs/Pleura: Linear areas of scarring in the lingula. Lingular nodule measures 5 mm on  image 78, stable. Nodule along the right minor fissure measures 5 mm on image 67, stable slight nodularity along the left major fissure on image 59 is stable. Left upper lobe nodule on image 54 measures 5 mm, stable. No new or enlarging pulmonary nodules. No pleural effusions. Upper Abdomen: Imaging into the upper abdomen shows no acute findings. Musculoskeletal: Chest wall soft tissues are unremarkable. No acute bony abnormality. Review of the MIP images confirms the above findings. 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 Recommend semi-annual imaging followup by CTA or MRA and referral to cardiothoracic surgery if not already obtained. This recommendation follows 2010 ACCF/AHA/AATS/ACR/ASA/SCA/SCAI/SIR/STS/SVM Guidelines for the Diagnosis and Management of Patients With Thoracic Aortic Disease. Circulation. 2010; 121: V400-Q676 Scattered small pulmonary nodules are stable. No follow-up needed if patient is low-risk (and has no known or suspected primary neoplasm). Non-contrast chest CT can be considered in 12 months if patient is high-risk. This recommendation follows the consensus statement: Guidelines for Management of Incidental Pulmonary Nodules Detected on CT Images: From the Fleischner Society 2017; Radiology 2017; 284:228-243. Electronically Signed   By: Rolm Baptise M.D.   On: 09/21/2017 15:20        CLINICAL DATA:  Unexplained retrocardiac density on a  recent lateral chest radiograph view. Inpatient.  EXAM: CT CHEST WITH CONTRAST  TECHNIQUE: Multidetector CT imaging of the chest was performed during intravenous contrast administration.  CONTRAST:  41mL ISOVUE-300 IOPAMIDOL (ISOVUE-300) INJECTION 61%  COMPARISON:  01/04/2017 chest radiograph.  FINDINGS: Cardiovascular: Top-normal heart size. No significant pericardial fluid/thickening. Left anterior descending coronary atherosclerosis. Atherosclerotic and tortuous thoracic aorta with aneurysmal 4.7  cm maximum diameter ascending thoracic aorta. Normal caliber pulmonary arteries. No central pulmonary emboli.  Mediastinum/Nodes: Heterogeneous and atrophic appearing thyroid gland without discrete thyroid nodules. Unremarkable esophagus. No pathologically enlarged axillary, mediastinal or hilar lymph nodes.  Lungs/Pleura: No pneumothorax. No pleural effusion. There are four scattered solid pulmonary nodules, largest 5 mm in the right middle lobe associated with the minor fissure (series 3/image 79) and 5 mm in the lingula (series 3/ image 100). No acute consolidative airspace disease, lung masses or additional significant pulmonary nodules. Mild centrilobular and paraseptal emphysema.  Upper abdomen: Unremarkable.  Musculoskeletal: No aggressive appearing focal osseous lesions. Moderate thoracic spondylosis.  IMPRESSION: 1. Retrocardiac density on the lateral chest radiograph view correlates with focal tortuosity of the descending thoracic aorta. No lung masses. No consolidative airspace disease. 2. Four scattered solid pulmonary nodules, largest 5 mm. No follow-up needed if patient is low-risk (and has no known or suspected primary neoplasm). Non-contrast chest CT can be considered in 12 months if patient is high-risk. This recommendation follows the consensus statement: Guidelines for Management of Incidental Pulmonary Nodules Detected on CT Images: From the Fleischner Society 2017; Radiology 2017; 284:228-243. 3. Aortic atherosclerosis. Ascending thoracic aortic aneurysm, maximum diameter 4.7 cm. Ascending thoracic aortic aneurysm. Recommend semi-annual imaging followup by CTA or MRA and referral to cardiothoracic surgery if not already obtained. This recommendation follows 2010 ACCF/AHA/AATS/ACR/ASA/SCA/SCAI/SIR/STS/SVM Guidelines for the Diagnosis and Management of Patients With Thoracic Aortic Disease. Circulation. 2010; 121: A250-N397. 4. Mild  emphysema.      Recent Lab Findings: Lab Results  Component Value Date   WBC 8.8 01/15/2017   HGB 11.6 (L) 01/15/2017   HCT 33.4 (L) 01/15/2017   PLT 142 (L) 01/15/2017   GLUCOSE 126 (H) 01/14/2017   CHOL 141 02/05/2016   TRIG 300 (H) 02/05/2016   HDL 26 (L) 02/05/2016   LDLCALC 55 02/05/2016   ALT 18 11/16/2016   AST 23 11/16/2016   NA 137 01/14/2017   K 4.5 01/14/2017   CL 105 01/14/2017   CREATININE 1.20 01/14/2017   BUN 12 01/14/2017   CO2 25 01/14/2017   TSH 5.938 (H) 02/06/2016   INR 1.04 11/16/2016   HGBA1C 5.4 02/05/2016   ECHO:01/2016 LV EF: 50% -   55%  ------------------------------------------------------------------- Indications:      CVA 436.  ------------------------------------------------------------------- Study Conclusions  - Left ventricle: The cavity size was normal. Wall thickness was   normal. Systolic function was normal. The estimated ejection   fraction was in the range of 50% to 55%. Wall motion was normal;   there were no regional wall motion abnormalities. Doppler   parameters are consistent with abnormal left ventricular   relaxation (grade 1 diastolic dysfunction). - Aortic root: The aortic root was mildly dilated. - Mitral valve: Calcified annulus.  Impressions:  - Technically difficult; LV function low normal to mildly reduced   (EF 50); saline microcavitation study with late bubbles felt to   be nondiagnostic; if clinically indicated, TEE would have greater   sensitivity for source of embolus.  Transthoracic echocardiography.  M-mode, complete 2D, spectral Doppler, and color Doppler.  Birthdate:  Patient birthdate: Mar 04, 1951.  Age:  Patient is 66 yr old.  Sex:  Gender: male. BMI: 35.8 kg/m^2.  Blood pressure:     123/94  Patient status: Inpatient.  Study date:  Study date: 02/07/2016. Study time: 10:04 AM.  Location:   Bedside.  -------------------------------------------------------------------  ------------------------------------------------------------------- Left ventricle:  The cavity size was normal. Wall thickness was normal. Systolic function was normal. The estimated ejection fraction was in the range of 50% to 55%. Wall motion was normal; there were no regional wall motion abnormalities. Doppler parameters are consistent with abnormal left ventricular relaxation (grade 1 diastolic dysfunction).  ------------------------------------------------------------------- Aortic valve:   Trileaflet; mildly calcified leaflets. Mobility was not restricted.  Doppler:  Transvalvular velocity was within the normal range. There was no stenosis. There was no regurgitation.   ------------------------------------------------------------------- Aorta:  Aortic root: The aortic root was mildly dilated.  ------------------------------------------------------------------- Mitral valve:   Calcified annulus. Mobility was not restricted. Doppler:  Transvalvular velocity was within the normal range. There was no evidence for stenosis. There was no regurgitation.  ------------------------------------------------------------------- Left atrium:  The atrium was normal in size.  ------------------------------------------------------------------- Right ventricle:  The cavity size was normal. Systolic function was normal.  ------------------------------------------------------------------- Pulmonic valve:    Doppler:  Transvalvular velocity was within the normal range. There was no evidence for stenosis.  ------------------------------------------------------------------- Tricuspid valve:   Structurally normal valve.    Doppler: Transvalvular velocity was within the normal range. There was trivial regurgitation.  ------------------------------------------------------------------- Pulmonary artery:    Systolic pressure was within the normal range.   ------------------------------------------------------------------- Right atrium:  The atrium was normal in size.  ------------------------------------------------------------------- Pericardium:  There was no pericardial effusion.  ------------------------------------------------------------------- Systemic veins: Inferior vena cava: The vessel was normal in size.  ------------------------------------------------------------------- Measurements   Left ventricle                         Value        Reference  LV ID, ED, PLAX chordal        (H)     52.3  mm     43 - 52  LV ID, ES, PLAX chordal        (H)     43.2  mm     23 - 38  LV fx shortening, PLAX chordal (L)     17    %      >=29  LV PW thickness, ED                    10.2  mm     ---------  IVS/LV PW ratio, ED                    1.04         <=1.3  LV ejection fraction, 1-p A4C          47    %      ---------  LV e&', lateral                         12.4  cm/s   ---------  LV E/e&', lateral                       4.73         ---------  LV e&', medial  6.85  cm/s   ---------  LV E/e&', medial                        8.57         ---------  LV e&', average                         9.63  cm/s   ---------  LV E/e&', average                       6.1          ---------    Ventricular septum                     Value        Reference  IVS thickness, ED                      10.6  mm     ---------    LVOT                                   Value        Reference  LVOT ID, S                             23    mm     ---------  LVOT area                              4.15  cm^2   ---------    Aorta                                  Value        Reference  Aortic root ID, ED                     38    mm     ---------    Left atrium                            Value        Reference  LA ID, A-P, ES                         38    mm     ---------  LA ID/bsa, A-P                          1.51  cm/m^2 <=2.2  LA volume, ES, 1-p A4C                 45.9  ml     ---------  LA volume/bsa, ES, 1-p A4C             18.3  ml/m^2 ---------    Mitral valve                           Value        Reference  Mitral E-wave peak velocity  58.7  cm/s   ---------  Mitral A-wave peak velocity            67.4  cm/s   ---------  Mitral deceleration time       (H)     313   ms     150 - 230  Mitral E/A ratio, peak                 0.9          ---------    Systemic veins                         Value        Reference  Estimated CVP                          3     mm Hg  ---------    Right ventricle                        Value        Reference  TAPSE                                  15.3  mm     ---------  RV s&', lateral, S                      11.7  cm/s   ---------  Legend: (L)  and  (H)  mark values outside specified reference range.  ------------------------------------------------------------------- Prepared and Electronically Authenticated by  Kirk Ruths 2017-02-12T15:08:13    *[facility]*                         [address], [place] [address]                            [phone]  ------------------------------------------------------------------- Noninvasive Vascular Lab  Transcranial Duplex Study  Patient:    Christian, Weeks MR #:       [id number] Study Date: 02/07/2016 Gender:     M Age:        2 Height: Weight: BSA: Pt. Status: Room:       [id number]   ATTENDING    Eliezer Bottom [id number]  SONOGRAPHER  Landry Mellow, RVT, RDMS  Rachel Bo  Teressa Senter  Reports also to:  ------------------------------------------------------------------- History and indications:  Indications  Stroke patient with lower extremity  DVT.  ------------------------------------------------------------------- Study information:  Study status:  Routine.  Procedure:  A vascular evaluation was performed. The right ICA, right ophthalmic, right middle cerebral, right anterior cerebral, right posterior cerebral, left ICA, left ophthalmic, left middle cerebral, left anterior cerebral, and left posterior cerebral arteries were studied. Image quality was excellent.    Transcranial duplex study.  Birthdate:  Patient birthdate: [birth year].  Age:  Patient is 67 yr old.  Sex:  Gender:  male.  Study date:  Study date: 02/07/2016. Study time: 02:36 PM. Location:  Vascular laboratory.  Patient status:  Inpatient.   ------------------------------------------------------------------- Summary:  - Transcranial Doppler Bubble study.   Performed by Dr. Erlinda Hong.     Verbal consent taken and risks/ benefits explained.     Left forearm IV was used for procedure.   Right Middle Cerebral artery was insonated.     HITS heard at rest: Spencer degree I   HITS heard during valsalva: Spencer degree IV -V     PFO size: small size at rest, large size during valsalva. - Postive Transcranial Doppler study indicative of a large right to   left intracardiac shunt with valsalava manouvre.  Prepared and Electronically Authenticated by  Antony Contras MD 2017-02-14T08:34:19   Aortic Size Index=   4.7      /Body surface area is 2.36 meters squared. = 2.0  < 2.75 cm/m2      4% risk per year 2.75 to 4.25          8% risk per year > 4.25 cm/m2    20% risk per year  cross sectional area of aorta cm2/height in meters > 10 consider  surgery  Assessment / Plan:   Aortic atherosclerosis. Ascending aortic thoracic aneurysm 4.6 cm in size with a trileaflet aortic valve Stroke without residual 01/2016  PFO- by transcranial doppler  History of left leg DVT Bilateral knee replacements  Follow-up CTA of the chest show stability of the ascending aorta and  4.6 cm compared to 6 months, the initial scan the patient had. I've reviewed the diagnosis ascending aortic dilatation with the patient and his wife, good blood pressure control, avoiding heavy lifting and avoidance of Cipro was discussed with the patient. We'll plan a follow-up CTA of the chest in 8 months.  Patient was warned about not using Cipro and similar antibiotics. Recent studies have raised concern that fluoroquinolone antibiotics could be associated with an increased risk of aortic aneurysm Fluoroquinolones have non-antimicrobial properties that might jeopardise the integrity of the extracellular matrix of the vascular wall In a  propensity score matched cohort study in [place], there was a 66% increased rate of aortic aneurysm or dissection associated with oral fluoroquinolone use, compared with amoxicillin use, within a 60 day risk period from start of treatment  Grace Isaac MD      [address] [place] [address] Office [phone]   Beeper [phone]  09/21/2017 4:10 PM

## 2017-09-21 NOTE — Patient Instructions (Signed)

## 2017-10-23 DIAGNOSIS — Z1212 Encounter for screening for malignant neoplasm of rectum: Secondary | ICD-10-CM | POA: Diagnosis not present

## 2017-10-23 DIAGNOSIS — Z1211 Encounter for screening for malignant neoplasm of colon: Secondary | ICD-10-CM | POA: Diagnosis not present

## 2017-11-23 IMAGING — CT CT CHEST W/ CM
2 of 3 series · 15 of 36 positions shown, 18 images · IV contrast (Omni 300)
Comparison: 01/04/2017 chest radiograph.

CLINICAL DATA: Unexplained retrocardiac density on a recent lateral
chest radiograph view. Inpatient.

EXAM:
CT CHEST WITH CONTRAST
TECHNIQUE: Multidetector CT imaging of the chest was performed during
intravenous contrast administration.
CONTRAST:  75mL 0YKBMT-7TT IOPAMIDOL (0YKBMT-7TT) INJECTION 61%

[Series 2: chest with 2mm st · axial · 0.88mm/px · z∈[+1254,+1554]mm · 12 of 176 slices shown, 15 images]
[im 13/176  mediastinal]
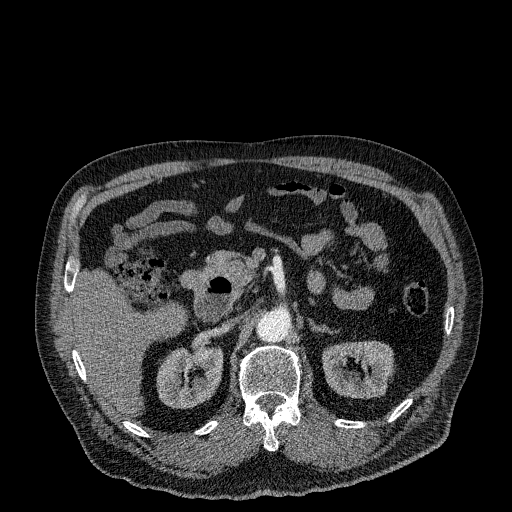
[im 13/176  lung]
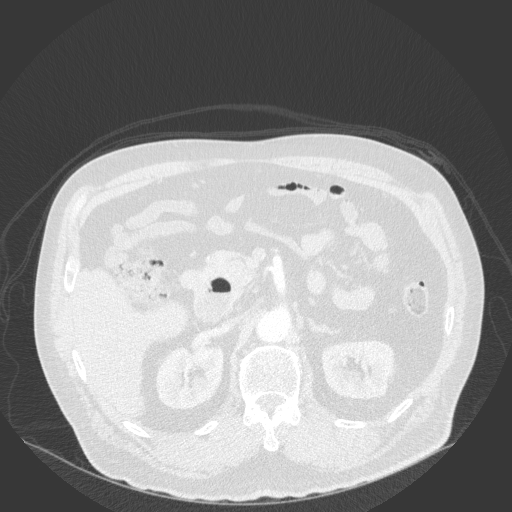
[im 26/176  lung]
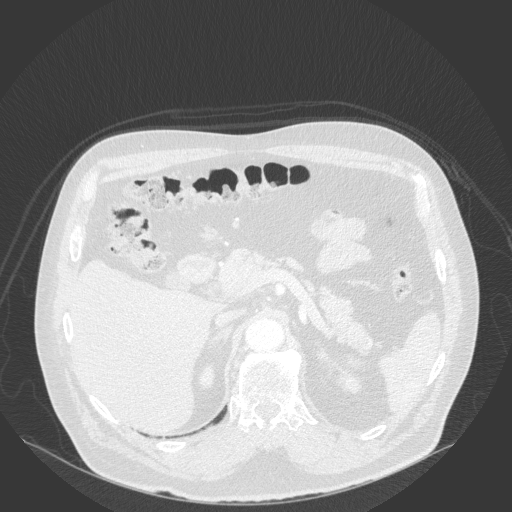
[im 39/176  lung]
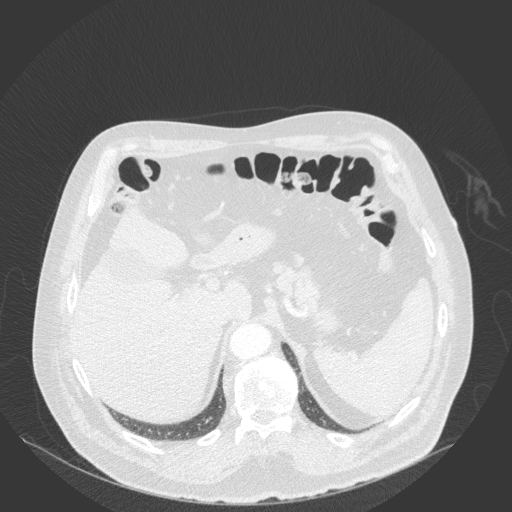
[im 52/176  lung]
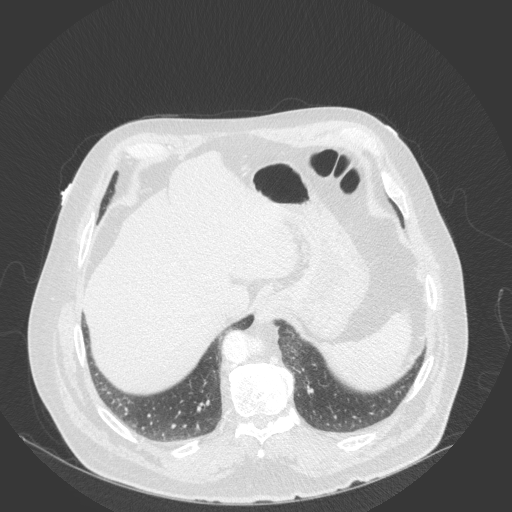
[im 65/176  mediastinal]
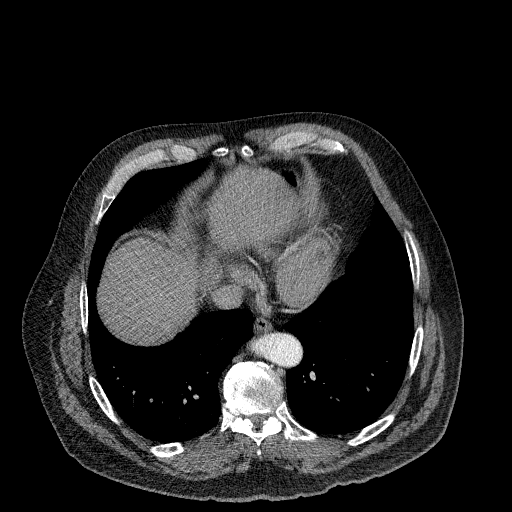
[im 65/176  lung]
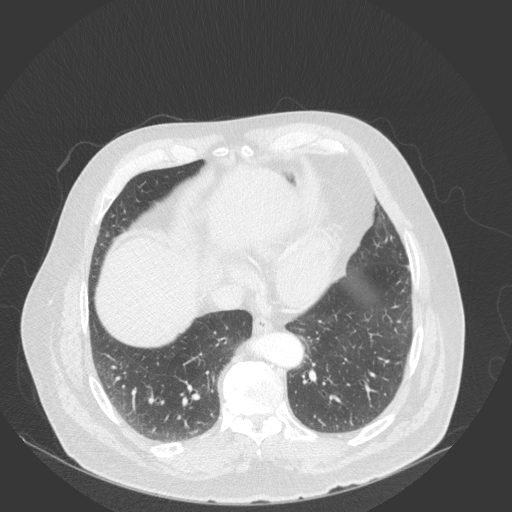
[im 78/176  lung]
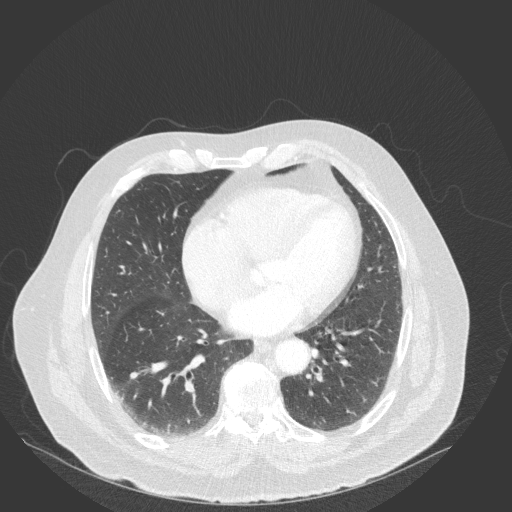
[im 98/176  lung]
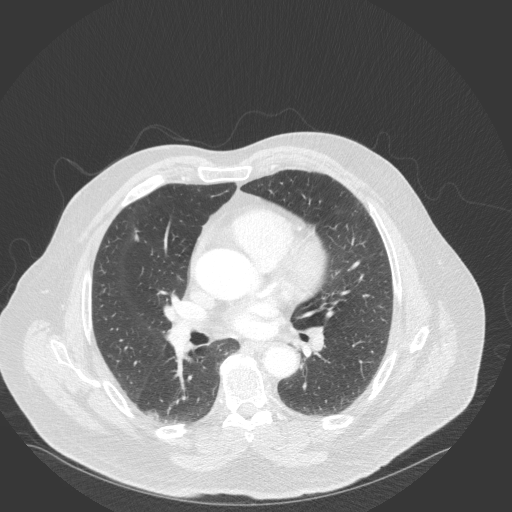
[im 111/176  lung]
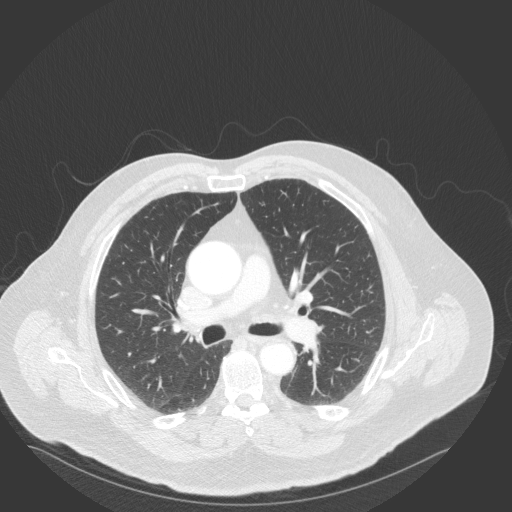
[im 124/176  mediastinal]
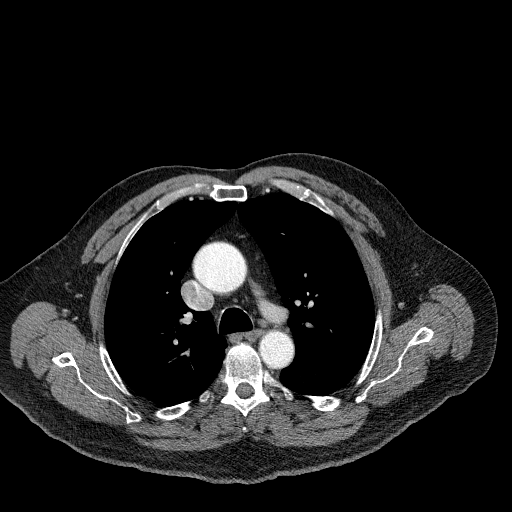
[im 124/176  lung]
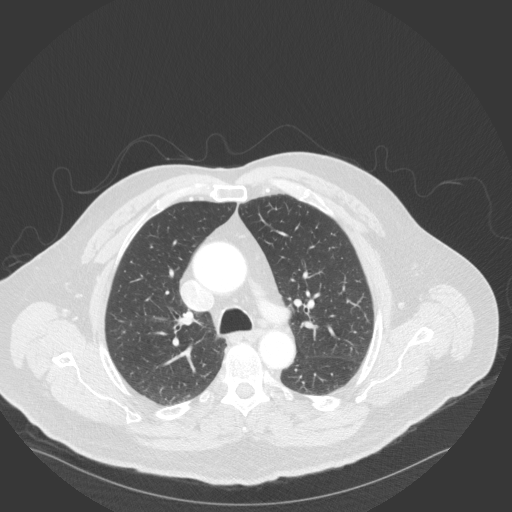
[im 137/176  lung]
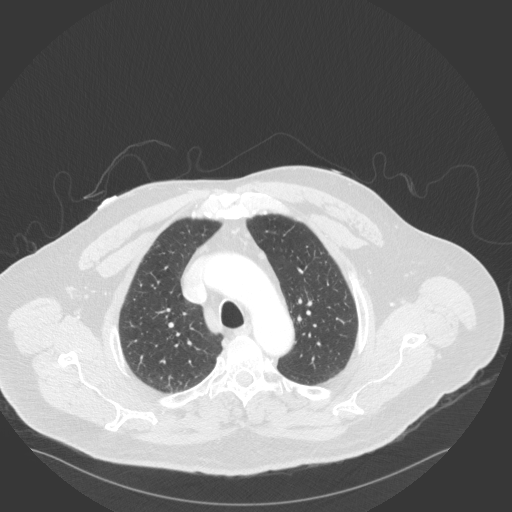
[im 150/176  lung]
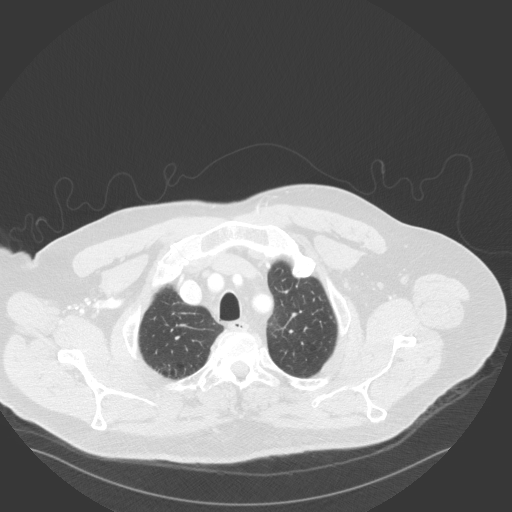
[im 163/176  lung]
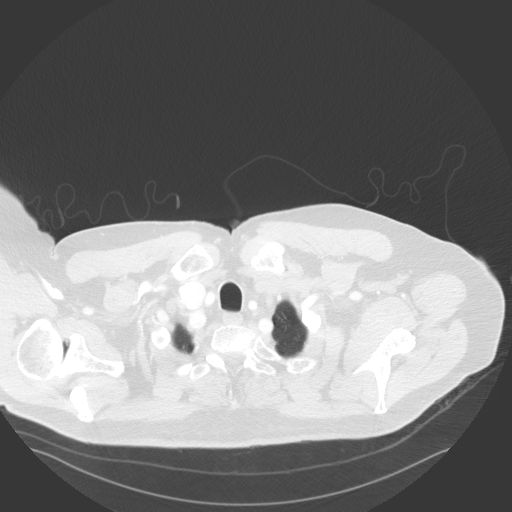

[Series 5: chest with 3mm st cor · coronal · 0.67mm/px · 3 of 101 slices shown]
[im 21/101  lung]
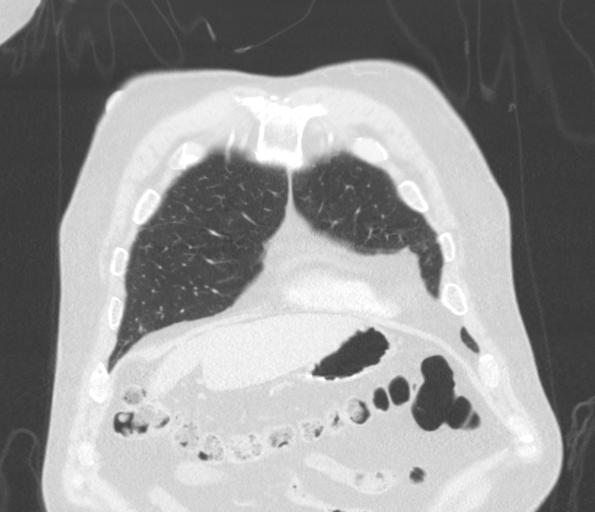
[im 41/101  lung]
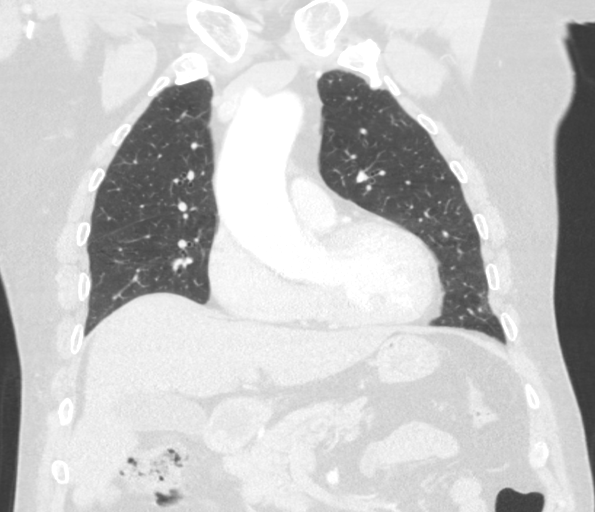
[im 61/101  lung]
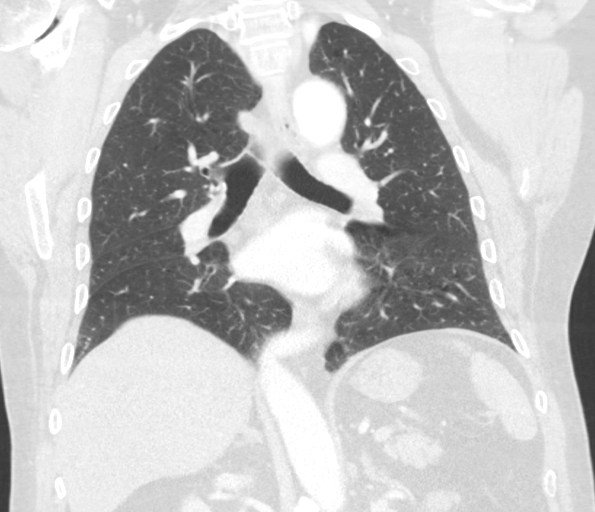

[15 of 36 positions shown; findings below may reference images not displayed]

FINDINGS: Cardiovascular: Top-normal heart size. No significant pericardial
fluid/thickening. Left anterior descending coronary atherosclerosis.
Atherosclerotic and tortuous thoracic aorta with aneurysmal 4.7 cm
maximum diameter ascending thoracic aorta. Normal caliber pulmonary
arteries. No central pulmonary emboli.

Mediastinum/Nodes: Heterogeneous and atrophic appearing thyroid
gland without discrete thyroid nodules. Unremarkable esophagus. No
pathologically enlarged axillary, mediastinal or hilar lymph nodes.

Lungs/Pleura: No pneumothorax. No pleural effusion. There are four
scattered solid pulmonary nodules, largest 5 mm in the right middle
lobe associated with the minor fissure (series 3/image 79) and 5 mm
in the lingula (series 3/ image 100). No acute consolidative
airspace disease, lung masses or additional significant pulmonary
nodules. Mild centrilobular and paraseptal emphysema.

Upper abdomen: Unremarkable.

Musculoskeletal: No aggressive appearing focal osseous lesions.
Moderate thoracic spondylosis.
IMPRESSION: 1. Retrocardiac density on the lateral chest radiograph view
correlates with focal tortuosity of the descending thoracic aorta.
No lung masses. No consolidative airspace disease.
2. Four scattered solid pulmonary nodules, largest 5 mm. No
follow-up needed if patient is low-risk (and has no known or
suspected primary neoplasm). Non-contrast chest CT can be considered
in 12 months if patient is high-risk. This recommendation follows
the consensus statement: Guidelines for Management of Incidental
Pulmonary Nodules Detected on CT Images: From the [HOSPITAL]
3. Aortic atherosclerosis. Ascending thoracic aortic aneurysm,
maximum diameter 4.7 cm. Ascending thoracic aortic aneurysm.
Recommend semi-annual imaging followup by CTA or MRA and referral to
cardiothoracic surgery if not already obtained. This recommendation
follows 1393 ACCF/AHA/AATS/ACR/ASA/SCA/[place] Guidelines
for the Diagnosis and Management of Patients With Thoracic Aortic
Disease. Circulation. 1393; 121: e266-e369.
4. Mild emphysema.

## 2017-11-27 DIAGNOSIS — I10 Essential (primary) hypertension: Secondary | ICD-10-CM | POA: Diagnosis not present

## 2017-11-27 DIAGNOSIS — E559 Vitamin D deficiency, unspecified: Secondary | ICD-10-CM | POA: Diagnosis not present

## 2017-11-27 DIAGNOSIS — M8580 Other specified disorders of bone density and structure, unspecified site: Secondary | ICD-10-CM | POA: Diagnosis not present

## 2018-01-22 DIAGNOSIS — R195 Other fecal abnormalities: Secondary | ICD-10-CM | POA: Diagnosis not present

## 2018-01-22 DIAGNOSIS — Z1210 Encounter for screening for malignant neoplasm of intestinal tract, unspecified: Secondary | ICD-10-CM | POA: Diagnosis not present

## 2018-02-26 DIAGNOSIS — C189 Malignant neoplasm of colon, unspecified: Secondary | ICD-10-CM | POA: Diagnosis not present

## 2018-02-26 DIAGNOSIS — K635 Polyp of colon: Secondary | ICD-10-CM | POA: Diagnosis not present

## 2018-02-26 DIAGNOSIS — C187 Malignant neoplasm of sigmoid colon: Secondary | ICD-10-CM | POA: Diagnosis not present

## 2018-02-26 DIAGNOSIS — Z1211 Encounter for screening for malignant neoplasm of colon: Secondary | ICD-10-CM | POA: Diagnosis not present

## 2018-02-26 DIAGNOSIS — D126 Benign neoplasm of colon, unspecified: Secondary | ICD-10-CM | POA: Diagnosis not present

## 2018-02-26 DIAGNOSIS — K5730 Diverticulosis of large intestine without perforation or abscess without bleeding: Secondary | ICD-10-CM | POA: Diagnosis not present

## 2018-02-26 HISTORY — PX: OTHER SURGICAL HISTORY: SHX169

## 2018-02-27 ENCOUNTER — Other Ambulatory Visit: Payer: Self-pay | Admitting: Gastroenterology

## 2018-02-27 DIAGNOSIS — C189 Malignant neoplasm of colon, unspecified: Secondary | ICD-10-CM

## 2018-02-28 ENCOUNTER — Other Ambulatory Visit: Payer: Self-pay | Admitting: Gastroenterology

## 2018-02-28 ENCOUNTER — Ambulatory Visit
Admission: RE | Admit: 2018-02-28 | Discharge: 2018-02-28 | Disposition: A | Discharge status: Home / Self Care | Payer: Medicare HMO | Source: Ambulatory Visit | Attending: Gastroenterology | Admitting: Gastroenterology

## 2018-02-28 ENCOUNTER — Ambulatory Visit: Payer: Self-pay | Admitting: General Surgery

## 2018-02-28 ENCOUNTER — Other Ambulatory Visit: Payer: Medicare HMO

## 2018-02-28 DIAGNOSIS — D126 Benign neoplasm of colon, unspecified: Secondary | ICD-10-CM | POA: Diagnosis not present

## 2018-02-28 DIAGNOSIS — K635 Polyp of colon: Secondary | ICD-10-CM | POA: Diagnosis not present

## 2018-02-28 DIAGNOSIS — C189 Malignant neoplasm of colon, unspecified: Secondary | ICD-10-CM

## 2018-02-28 DIAGNOSIS — K6389 Other specified diseases of intestine: Secondary | ICD-10-CM

## 2018-02-28 DIAGNOSIS — R195 Other fecal abnormalities: Secondary | ICD-10-CM

## 2018-02-28 DIAGNOSIS — K639 Disease of intestine, unspecified: Secondary | ICD-10-CM | POA: Diagnosis not present

## 2018-02-28 MED ORDER — IOPAMIDOL (ISOVUE-300) INJECTION 61%
100.0000 mL | Freq: Once | INTRAVENOUS | Status: AC | PRN
  Administered 2018-02-28: 125 mL via INTRAVENOUS

## 2018-03-01 ENCOUNTER — Other Ambulatory Visit: Payer: Medicare HMO

## 2018-03-01 ENCOUNTER — Telehealth: Payer: Self-pay | Admitting: Cardiology

## 2018-03-01 NOTE — Telephone Encounter (Signed)
° °  [facility] Pre-operative Risk Assessment    Request for surgical clearance:  1. What type of surgery is being performed? Colon surgery   2. When is this surgery scheduled? Not scheduled yet but would like to have it next.. Urgent   3. What type of clearance is required (medical clearance vs. Pharmacy clearance to hold med vs. Both)? Medical clearance  4. Are there any medications that need to be held prior to surgery and how long? N/A  5. Practice name and name of physician performing surgery? [facility]// Dr. Eddie Dibbles Toph   What is your office phone and fax number? Office # [phone]  Fax # [phone]  6. Anesthesia type (None, local, MAC, general) ? General    Marjean Donna 03/01/2018, 3:47 PM  _________________________________________________________________   (provider comments below)

## 2018-03-02 NOTE — Telephone Encounter (Signed)
Called patient to schedule appointment. Left message for patient to contact office.

## 2018-03-02 NOTE — Telephone Encounter (Signed)
   Primary Cardiologist: Dr Curt Bears  Chart reviewed as part of pre-operative protocol coverage. Because of Caeson H Gaglio's past medical history and time since last visit, he/she will require a follow-up visit in order to better assess preoperative cardiovascular risk.  Pre-op covering staff: - Please schedule appointment and call patient to inform them. - Please contact requesting surgeon's office via preferred method (i.e, phone, fax) to inform them of need for appointment prior to surgery.  Kerin Ransom, PA-C  03/02/2018, 2:37 PM

## 2018-03-02 NOTE — Telephone Encounter (Signed)
Follow up  ° ° °Patient is returning call. Please call to discuss.  °

## 2018-03-02 NOTE — Telephone Encounter (Signed)
Spoke with patient and informed him that he would need an appt for cardiac clearance. Appt made for Monday with Cecilie Kicks on Monday 3.11.2019; patient voiced understanding.

## 2018-03-05 ENCOUNTER — Encounter: Payer: Self-pay | Admitting: Cardiology

## 2018-03-05 ENCOUNTER — Ambulatory Visit: Payer: Medicare HMO | Admitting: Cardiology

## 2018-03-05 VITALS — BP 106/82 | HR 62 | Ht 72.0 in | Wt 247.0 lb

## 2018-03-05 DIAGNOSIS — I10 Essential (primary) hypertension: Secondary | ICD-10-CM | POA: Diagnosis not present

## 2018-03-05 DIAGNOSIS — Z01810 Encounter for preprocedural cardiovascular examination: Secondary | ICD-10-CM

## 2018-03-05 DIAGNOSIS — I719 Aortic aneurysm of unspecified site, without rupture: Secondary | ICD-10-CM | POA: Diagnosis not present

## 2018-03-05 MED ORDER — LOSARTAN POTASSIUM 100 MG PO TABS: 50.0000 mg | ORAL_TABLET | Freq: Every day | ORAL | 3 refills | Status: DC

## 2018-03-05 NOTE — Patient Instructions (Signed)
Medication Instructions:  1. Your physician recommends that you continue on your current medications as directed. Please refer to the Current Medication list given to you today.   Labwork: NONE ORDERED TODAY  Testing/Procedures: NONE ORDERED TODAY  Follow-Up: Your physician wants you to follow-up in: [place] will receive a reminder letter in the mail two months in advance. If you don't receive a letter, please call our office to schedule the follow-up appointment.   Any Other Special Instructions Will Be Listed Below (If Applicable).     If you need a refill on your cardiac medications before your next appointment, please call your pharmacy.

## 2018-03-05 NOTE — Progress Notes (Signed)
Cardiology Office Note   Date:  03/05/2018   ID:  Christian Weeks, DOB [birth year], MRN [id number]  PCP:  Christian Pretty, MD  Cardiologist:  Christian Weeks ?    Chief Complaint  Patient presents with  . Pre-op Exam    for colon surgery       History of Present Illness: Christian Weeks is a 67 y.o. male who presents for pre-op clearance for colon surgery, Urgent per Dr. Marlou Weeks.  Plan to remove portion of large intestine.    He has a history significant for hypertension, hypothyroidism, and CVA. He has had right total knee arthroplasty and Lt.knee arthroplasty in the past.    He is followed by Dr. Servando Weeks for ascending aortic aneurysm of 4.6 CM stable. Per Dr. Servando Weeks . "I've reviewed the diagnosis ascending aortic dilatation with the patient and his wife, good blood pressure control, avoiding heavy lifting and avoidance of Cipro was discussed with the patient. We'll plan a follow-up CTA of the chest in 8 months.  Patient was warned about not using Cipro and similar antibiotics.  Recent studies have raised concern that fluoroquinolone antibiotics could be associated with an increased risk of aortic aneurysm  Fluoroquinolones have non-antimicrobial properties that might jeopardise the integrity of the extracellular matrix of the vascular wall  In a  propensity score matched cohort study in [place], there was a 66% increased rate of aortic aneurysm or dissection associated with oral fluoroquinolone use, compared with amoxicillin use, within a 60 day risk period from start of treatment    Today, he denies symptoms of palpitations, or rapid heart rate, no chest pain, shortness of breath, orthopnea, PND, lower extremity edema, claudication, dizziness, presyncope, syncope, bleeding, or neurologic sequela.  The cancer was found on screening colonoscopy.  He is walking 2 miles per day without pain or SOB, he can climb stairs but has not done so.      Past Medical History:  Diagnosis Date  .  Arthritis   . Blood clot in vein 01/2016  . Degenerative joint disease   . History of kidney stones   . Hypertension   . Hypothyroidism   . Stroke (Christian Weeks) 01/2016   speech difficulty for a few hours  . Wears dentures   . Wears glasses     Past Surgical History:  Procedure Laterality Date  . KNEE ARTHROPLASTY Left 01/15/2016   Procedure: COMPUTER ASSISTED TOTAL KNEE ARTHROPLASTY;  Surgeon: Christian Killings, MD;  Location: [facility];  Service: Orthopedics;  Laterality: Left;  . TOTAL KNEE ARTHROPLASTY Right 01/13/2017   Procedure: RIGHT TOTAL KNEE ARTHROPLASTY;  Surgeon: Christian Killings, MD;  Location: [facility];  Service: Orthopedics;  Laterality: Right;     Current Outpatient Medications  Medication Sig Dispense Refill  . acetaminophen (TYLENOL) 500 MG tablet Take 1,000 mg by mouth every 6 (six) hours as needed for moderate pain.    . Cholecalciferol (VITAMIN D3) 3000 units TABS Take 3,000 Units by mouth daily.    Marland Kitchen levothyroxine (SYNTHROID, LEVOTHROID) 137 MCG tablet Take 137 mcg by mouth daily before breakfast.    . losartan (COZAAR) 100 MG tablet Take 0.5 tablets (50 mg total) by mouth daily. 90 tablet 3   No current facility-administered medications for this visit.     Allergies:   Patient has no known allergies.    Social History:  The patient  reports that he quit smoking about 21 years ago. he has never used smokeless tobacco. He reports that he does  not drink alcohol or use drugs.   Family History:  The patient's family history includes Congestive Heart Failure in his mother; Diabetes in his mother; Thyroid disease in his sister.    ROS:  General:no colds or fevers, no weight changes Skin:no rashes or ulcers HEENT:no blurred vision, no congestion CV:see HPI PUL:see HPI GI:no diarrhea constipation or melena, no indigestion GU:no hematuria, no dysuria MS:no joint pain, no claudication Neuro:no syncope, no lightheadedness Endo:no diabetes, + thyroid disease  Wt Readings from Last  3 Encounters:  03/05/18 247 lb (112 kg)  09/21/17 242 lb (109.8 kg)  03/28/17 240 lb (108.9 kg)     PHYSICAL EXAM: VS:  BP 106/82   Pulse 62   Ht 6' (1.829 m)   Wt 247 lb (112 kg)   BMI 33.50 kg/m  , BMI Body mass index is 33.5 kg/m. General:Pleasant affect, NAD Skin:Warm and dry, brisk capillary refill HEENT:normocephalic, sclera clear, mucus membranes moist Neck:supple, no JVD, no bruits  Heart:S1S2 RRR without murmur, gallup, rub or click Lungs:clear without rales, rhonchi, or wheezes IEP:PIRJ, non tender, + BS, do not palpate liver spleen or masses Ext:no lower ext edema, 2+ pedal pulses, 2+ radial pulses Neuro:alert and oriented X 3, MAE, follows commands, + facial symmetry    EKG:  EKG is ordered today. The ekg ordered today demonstrates SR at 62 no changes from EKG 2017.    Recent Labs: No results found for requested labs within last 8760 hours.    Lipid Panel    Component Value Date/Time   CHOL 141 02/05/2016 1950   TRIG 300 (H) 02/05/2016 1950   HDL 26 (L) 02/05/2016 1950   CHOLHDL 5.4 02/05/2016 1950   VLDL 60 (H) 02/05/2016 1950   Between 55 02/05/2016 1950       Other studies Reviewed: Additional studies/ records that were reviewed today include: .  Echo 01/2016 Study Conclusions  - Left ventricle: The cavity size was normal. Wall thickness was   normal. Systolic function was normal. The estimated ejection   fraction was in the range of 50% to 55%. Wall motion was normal;   there were no regional wall motion abnormalities. Doppler   parameters are consistent with abnormal left ventricular   relaxation (grade 1 diastolic dysfunction). - Aortic root: The aortic root was mildly dilated. - Mitral valve: Calcified annulus.  Impressions:  - Technically difficult; LV function low normal to mildly reduced   (EF 50); saline microcavitation study with late bubbles felt to   be nondiagnostic; if clinically indicated, TEE would have greater    sensitivity for source of embolus.   cTA of  Chest  08/2018  IMPRESSION: 4.6 cm ascending thoracic aortic aneurysm, stable. Recommend semi-annual imaging followup by CTA or MRA and referral to cardiothoracic surgery if not already obtained. This recommendation follows 2010 ACCF/AHA/AATS/ACR/ASA/SCA/SCAI/SIR/STS/SVM Guidelines for the Diagnosis and Management of Patients With Thoracic Aortic Disease. Circulation. 2010; 121: J884-Z660  ASSESSMENT AND PLAN:  1.  Pre-op clearance for abd surgery with removal of portion of large intestine for cancer..  Pt is class III risk - due to hx of stroke in the past with DVT and PFO after knee surgery.  He has no chest pain or SOB.  I discussed with Dr. Meda Coffee who agreed to proceed with the surgery with no further cardiac work up.    2.  Aortic atherosclerosis. Ascending aortic thoracic aneurysm 4.6 cm in size with a trileaflet aortic valve followed by Dr. Servando Weeks last CTA of chest  08/2017  Pt instructed no heavy lifting and no cipro.  For CTA of chest in May.    3.  HTN controlled.    Current medicines are reviewed with the patient today.  The patient Has no concerns regarding medicines.  The following changes have been made:  See above Labs/ tests ordered today include:see above  Disposition:   FU:  see above  Signed, Cecilie Kicks, NP  03/05/2018 2:28 PM    [facility] [place], [place], [place], [place] Phone: [phone]; Fax: [phone]

## 2018-03-06 ENCOUNTER — Encounter (HOSPITAL_COMMUNITY): Payer: Self-pay

## 2018-03-06 NOTE — Progress Notes (Signed)
EKG- 03/05/2018- epic 03/05/2018- Office visit with Sandria Senter for cardiac clearance- epic  09/21/2017- LOV- with Dr Servando Snare- Vascular Surgery regarding ascending thoracic aortic aneurysm  09/21/2017-CT Angio Chest- epic  02/07/2016-ECHO-epic

## 2018-03-06 NOTE — Patient Instructions (Signed)
Christian Weeks  03/06/2018   Your procedure is scheduled on: 03/08/2018   Report to [facility] Main  Entrance    ARRIVE AT 29 AM. Have a seat in the Main Lobby. Please note there is a phone at the [employer]. Please call [phone] on that phone. Someone from Short Stay will come and get you from the Main Lobby and take you to Short Stay.  Call this number if you have problems the morning of surgery [phone]   Remember: Do not eat food or drink liquids :After Midnight.            Follow Bowel Prep instructions per MD.      Take these medicines the morning of surgery with A SIP OF WATER: Synthroid                                 You may not have any metal on your body including hair pins and              piercings  Do not wear jewelry, , lotions, powders or perfumes, deodorant           .              Men may shave face and neck.   Do not bring valuables to the hospital. [place].  Contacts, dentures or bridgework may not be worn into surgery.  Leave suitcase in the car. After surgery it may be brought to your room.                       Please read over the following fact sheets you were given: _____________________________________________________________________             NO SOLID FOOD AFTER MIDNIGHT THE NIGHT PRIOR TO SURGERY. NOTHING BY MOUTH EXCEPT CLEAR LIQUIDS UNTIL 3 HOURS PRIOR TO [place] SURGERY. PLEASE FINISH ENSURE DRINK PER SURGEON ORDER 3 HOURS PRIOR TO SCHEDULED SURGERY TIME WHICH NEEDS TO BE COMPLETED AT ____0430   CLEAR LIQUID DIET   Foods Allowed                                                                     Foods Excluded  Coffee and tea, regular and decaf                             liquids that you cannot  Plain Jell-O in any flavor                                             see through such as: Fruit ices (not with fruit pulp)  milk, soups, orange juice  Iced Popsicles                                    All solid food Carbonated beverages, regular and diet                                    Cranberry, grape and apple juices Sports drinks like Gatorade Lightly seasoned clear broth or consume(fat free) Sugar, honey syrup  Sample Menu Breakfast                                Lunch                                     Supper Cranberry juice                    Beef broth                            Chicken broth Jell-O                                     Grape juice                           Apple juice Coffee or tea                        Jell-O                                      Popsicle                                                Coffee or tea                        Coffee or tea  _____________________________________________________________________  ________. [facility] - Preparing for Surgery Before surgery, you can play an important role.  Because skin is not sterile, your skin needs to be as free of germs as possible.  You can reduce the number of germs on your skin by washing with CHG (chlorahexidine gluconate) soap before surgery.  CHG is an antiseptic cleaner which kills germs and bonds with the skin to continue killing germs even after washing. Please DO NOT use if you have an allergy to CHG or antibacterial soaps.  If your skin becomes reddened/irritated stop using the CHG and inform your nurse when you arrive at Short Stay. Do not shave (including legs and underarms) for at least 48 hours prior to the first CHG shower.  You may shave your face/neck. Please follow these instructions carefully:  1.  Shower with CHG Soap the night before surgery and the  morning of Surgery.  2.  If you choose to  wash your hair, wash your hair first as usual with your  normal  shampoo.  3.  After you shampoo, rinse your hair and body thoroughly to remove the  shampoo.                           4.  Use CHG as  you would any other liquid soap.  You can apply chg directly  to the skin and wash                       Gently with a scrungie or clean washcloth.  5.  Apply the CHG Soap to your body ONLY FROM THE NECK DOWN.   Do not use on face/ open                           Wound or open sores. Avoid contact with eyes, ears mouth and genitals (private parts).                       Wash face,  Genitals (private parts) with your normal soap.             6.  Wash thoroughly, paying special attention to the area where your surgery  will be performed.  7.  Thoroughly rinse your body with warm water from the neck down.  8.  DO NOT shower/wash with your normal soap after using and rinsing off  the CHG Soap.                9.  Pat yourself dry with a clean towel.            10.  Wear clean pajamas.            11.  Place clean sheets on your bed the night of your first shower and do not  sleep with pets. Day of Surgery : Do not apply any lotions/deodorants the morning of surgery.  Please wear clean clothes to the hospital/surgery center.  FAILURE TO FOLLOW THESE INSTRUCTIONS MAY RESULT IN THE CANCELLATION OF YOUR SURGERY PATIENT SIGNATURE_________________________________  NURSE SIGNATURE__________________________________  ________________________________________________________________________  WHAT IS A BLOOD TRANSFUSION? Blood Transfusion Information  A transfusion is the replacement of blood or some of its parts. Blood is made up of multiple cells which provide different functions.  Red blood cells carry oxygen and are used for blood loss replacement.  White blood cells fight against infection.  Platelets control bleeding.  Plasma helps clot blood.  Other blood products are available for specialized needs, such as hemophilia or other clotting disorders. BEFORE THE TRANSFUSION  Who gives blood for transfusions?   Healthy volunteers who are fully evaluated to make sure their blood is safe. This is  blood bank blood. Transfusion therapy is the safest it has ever been in the practice of medicine. Before blood is taken from a donor, a complete history is taken to make sure that person has no history of diseases nor engages in risky social behavior (examples are intravenous drug use or sexual activity with multiple partners). The donor's travel history is screened to minimize risk of transmitting infections, such as malaria. The donated blood is tested for signs of infectious diseases, such as HIV and hepatitis. The blood is then tested to be sure it is compatible with you in order to minimize the chance of a transfusion reaction. If you or  a relative donates blood, this is often done in anticipation of surgery and is not appropriate for emergency situations. It takes many days to process the donated blood. RISKS AND COMPLICATIONS Although transfusion therapy is very safe and saves many lives, the main dangers of transfusion include:   Getting an infectious disease.  Developing a transfusion reaction. This is an allergic reaction to something in the blood you were given. Every precaution is taken to prevent this. The decision to have a blood transfusion has been considered carefully by your caregiver before blood is given. Blood is not given unless the benefits outweigh the risks. AFTER THE TRANSFUSION  Right after receiving a blood transfusion, you will usually feel much better and more energetic. This is especially true if your red blood cells have gotten low (anemic). The transfusion raises the level of the red blood cells which carry oxygen, and this usually causes an energy increase.  The nurse administering the transfusion will monitor you carefully for complications. HOME CARE INSTRUCTIONS  No special instructions are needed after a transfusion. You may find your energy is better. Speak with your caregiver about any limitations on activity for underlying diseases you may have. SEEK MEDICAL  CARE IF:   Your condition is not improving after your transfusion.  You develop redness or irritation at the intravenous (IV) site. SEEK IMMEDIATE MEDICAL CARE IF:  Any of the following symptoms occur over the next 12 hours:  Shaking chills.  You have a temperature by mouth above 102 F (38.9 C), not controlled by medicine.  Chest, back, or muscle pain.  People around you feel you are not acting correctly or are confused.  Shortness of breath or difficulty breathing.  Dizziness and fainting.  You get a rash or develop hives.  You have a decrease in urine output.  Your urine turns a dark color or changes to pink, red, or brown. Any of the following symptoms occur over the next 10 days:  You have a temperature by mouth above 102 F (38.9 C), not controlled by medicine.  Shortness of breath.  Weakness after normal activity.  The white part of the eye turns yellow (jaundice).  You have a decrease in the amount of urine or are urinating less often.  Your urine turns a dark color or changes to pink, red, or brown. Document Released: 12/09/2000 Document Revised: 03/05/2012 Document Reviewed: 07/28/2008 [facility] Patient Information 2014 [place], [place].  _______________________________________________________________________

## 2018-03-07 ENCOUNTER — Inpatient Hospital Stay (HOSPITAL_COMMUNITY): Admission: RE | Admit: 2018-03-07 | Payer: Medicare HMO | Source: Ambulatory Visit

## 2018-03-07 ENCOUNTER — Other Ambulatory Visit: Payer: Self-pay

## 2018-03-07 ENCOUNTER — Encounter (HOSPITAL_COMMUNITY)
Admission: RE | Admit: 2018-03-07 | Discharge: 2018-03-07 | Disposition: A | Discharge status: Home / Self Care | Payer: Medicare HMO | Source: Ambulatory Visit | Attending: General Surgery | Admitting: General Surgery

## 2018-03-07 ENCOUNTER — Encounter (HOSPITAL_COMMUNITY): Payer: Self-pay

## 2018-03-07 HISTORY — DX: Thoracic aortic aneurysm, without rupture: I71.2

## 2018-03-07 HISTORY — DX: Other specified diseases of intestine: K63.89

## 2018-03-07 HISTORY — DX: Aneurysm of the ascending aorta, without rupture: I71.21

## 2018-03-07 LAB — CBC WITH DIFFERENTIAL/PLATELET
BASOS ABS: 0 10*3/uL (ref 0.0–0.1)
Basophils Relative: 0 %
EOS ABS: 0.4 10*3/uL (ref 0.0–0.7)
EOS PCT: 6 %
HCT: 45.9 % (ref 39.0–52.0)
Hemoglobin: 15.9 g/dL (ref 13.0–17.0)
LYMPHS PCT: 21 %
Lymphs Abs: 1.5 10*3/uL (ref 0.7–4.0)
MCH: 30.9 pg (ref 26.0–34.0)
MCHC: 34.6 g/dL (ref 30.0–36.0)
MCV: 89.1 fL (ref 78.0–100.0)
Monocytes Absolute: 0.9 10*3/uL (ref 0.1–1.0)
Monocytes Relative: 13 %
Neutro Abs: 4.1 10*3/uL (ref 1.7–7.7)
Neutrophils Relative %: 60 %
PLATELETS: 224 10*3/uL (ref 150–400)
RBC: 5.15 MIL/uL (ref 4.22–5.81)
RDW: 13.3 % (ref 11.5–15.5)
WBC: 6.9 10*3/uL (ref 4.0–10.5)

## 2018-03-07 LAB — COMPREHENSIVE METABOLIC PANEL
ALT: 19 U/L (ref 17–63)
AST: 25 U/L (ref 15–41)
Albumin: 4 g/dL (ref 3.5–5.0)
Alkaline Phosphatase: 96 U/L (ref 38–126)
Anion gap: 8 (ref 5–15)
BUN: 12 mg/dL (ref 6–20)
CHLORIDE: 106 mmol/L (ref 101–111)
CO2: 24 mmol/L (ref 22–32)
CREATININE: 1.05 mg/dL (ref 0.61–1.24)
Calcium: 9.1 mg/dL (ref 8.9–10.3)
GFR calc Af Amer: >60 mL/min (ref >60)
GFR calc non Af Amer: >60 mL/min (ref >60)
GLUCOSE: 106 mg/dL — AB (ref 65–99)
Potassium: 4.1 mmol/L (ref 3.5–5.1)
SODIUM: 138 mmol/L (ref 135–145)
Total Bilirubin: 0.6 mg/dL (ref 0.3–1.2)
Total Protein: 7.4 g/dL (ref 6.5–8.1)

## 2018-03-07 LAB — ABO/RH: ABO/RH(D): O POS

## 2018-03-07 NOTE — Anesthesia Preprocedure Evaluation (Addendum)
Anesthesia Evaluation  Patient identified by MRN, date of birth, ID band Patient awake    Reviewed: Allergy & Precautions, NPO status   Airway Mallampati: II  TM Distance: >3 FB     Dental   Pulmonary former smoker,    breath sounds clear to auscultation       Cardiovascular hypertension, + Peripheral Vascular Disease   Rhythm:Regular Rate:Normal  Thoracic ascending aortic aneurysm (HCC)  4.6-4.7 cm followed by Dr Servando Snare- LOV- 08/2017-epic     Neuro/Psych Stroke  01/2016 speech difficulty for a few hours after knee replacement left  TIA   GI/Hepatic negative GI ROS, Neg liver ROS,   Endo/Other  Hypothyroidism   Renal/GU      Musculoskeletal  (+) Arthritis , Osteoarthritis,    Abdominal   Peds  Hematology   Anesthesia Other Findings   Reproductive/Obstetrics                             Anesthesia Physical  Anesthesia Plan  ASA: III  Anesthesia Plan: General   Post-op Pain Management:    Induction: Intravenous  PONV Risk Score and Plan: 2 and Ondansetron and Treatment may vary due to age or medical condition  Airway Management Planned: Oral ETT  Additional Equipment:   Intra-op Plan:   Post-operative Plan: Extubation in OR  Informed Consent: I have reviewed the patients History and Physical, chart, labs and discussed the procedure including the risks, benefits and alternatives for the proposed anesthesia with the patient or authorized representative who has indicated his/her understanding and acceptance.   Dental advisory given  Plan Discussed with: Surgeon, Anesthesiologist and CRNA  Anesthesia Plan Comments: (  )      Anesthesia Quick Evaluation

## 2018-03-08 ENCOUNTER — Inpatient Hospital Stay (HOSPITAL_COMMUNITY): Payer: Medicare HMO | Admitting: Anesthesiology

## 2018-03-08 ENCOUNTER — Encounter (HOSPITAL_COMMUNITY)
Admission: RE | Disposition: A | Discharge status: Home / Self Care | Payer: Self-pay | Source: Ambulatory Visit | Attending: General Surgery

## 2018-03-08 ENCOUNTER — Inpatient Hospital Stay (HOSPITAL_COMMUNITY)
Admission: RE | Admit: 2018-03-08 | Discharge: 2018-03-12 | DRG: 331 | Disposition: A | Discharge status: Home / Self Care | Payer: Medicare HMO | Source: Ambulatory Visit | Attending: General Surgery | Admitting: General Surgery

## 2018-03-08 ENCOUNTER — Encounter (HOSPITAL_COMMUNITY): Payer: Self-pay | Admitting: Emergency Medicine

## 2018-03-08 DIAGNOSIS — R1900 Intra-abdominal and pelvic swelling, mass and lump, unspecified site: Secondary | ICD-10-CM | POA: Diagnosis present

## 2018-03-08 DIAGNOSIS — Z01818 Encounter for other preprocedural examination: Secondary | ICD-10-CM

## 2018-03-08 DIAGNOSIS — C187 Malignant neoplasm of sigmoid colon: Principal | ICD-10-CM | POA: Diagnosis present

## 2018-03-08 DIAGNOSIS — Z79890 Hormone replacement therapy (postmenopausal): Secondary | ICD-10-CM

## 2018-03-08 DIAGNOSIS — I712 Thoracic aortic aneurysm, without rupture: Secondary | ICD-10-CM | POA: Diagnosis present

## 2018-03-08 DIAGNOSIS — Z87891 Personal history of nicotine dependence: Secondary | ICD-10-CM | POA: Diagnosis not present

## 2018-03-08 DIAGNOSIS — C772 Secondary and unspecified malignant neoplasm of intra-abdominal lymph nodes: Secondary | ICD-10-CM | POA: Diagnosis not present

## 2018-03-08 DIAGNOSIS — I739 Peripheral vascular disease, unspecified: Secondary | ICD-10-CM | POA: Diagnosis not present

## 2018-03-08 DIAGNOSIS — E039 Hypothyroidism, unspecified: Secondary | ICD-10-CM | POA: Diagnosis present

## 2018-03-08 DIAGNOSIS — Z8673 Personal history of transient ischemic attack (TIA), and cerebral infarction without residual deficits: Secondary | ICD-10-CM | POA: Diagnosis not present

## 2018-03-08 DIAGNOSIS — I10 Essential (primary) hypertension: Secondary | ICD-10-CM | POA: Diagnosis present

## 2018-03-08 DIAGNOSIS — Z96652 Presence of left artificial knee joint: Secondary | ICD-10-CM | POA: Diagnosis present

## 2018-03-08 DIAGNOSIS — K6389 Other specified diseases of intestine: Secondary | ICD-10-CM | POA: Diagnosis not present

## 2018-03-08 DIAGNOSIS — Z01812 Encounter for preprocedural laboratory examination: Secondary | ICD-10-CM

## 2018-03-08 DIAGNOSIS — E785 Hyperlipidemia, unspecified: Secondary | ICD-10-CM | POA: Diagnosis not present

## 2018-03-08 HISTORY — PX: LAPAROSCOPIC SIGMOID COLECTOMY: SHX5928

## 2018-03-08 LAB — TYPE AND SCREEN
ABO/RH(D): O POS
Antibody Screen: NEGATIVE

## 2018-03-08 LAB — CEA: CEA: 1.3 ng/mL (ref 0.0–4.7)

## 2018-03-08 SURGERY — COLECTOMY, SIGMOID, LAPAROSCOPIC
Anesthesia: General

## 2018-03-08 MED ORDER — CHLORHEXIDINE GLUCONATE CLOTH 2 % EX PADS: 6.0000 | MEDICATED_PAD | Freq: Once | CUTANEOUS | Status: DC

## 2018-03-08 MED ORDER — ONDANSETRON HCL 4 MG/2ML IJ SOLN
INTRAMUSCULAR | Status: DC | PRN
  Administered 2018-03-08: 4 mg via INTRAVENOUS

## 2018-03-08 MED ORDER — 0.9 % SODIUM CHLORIDE (POUR BTL) OPTIME
TOPICAL | Status: DC | PRN
  Administered 2018-03-08: 3000 mL

## 2018-03-08 MED ORDER — LIDOCAINE HCL (CARDIAC) 20 MG/ML IV SOLN
INTRAVENOUS | Status: DC | PRN
  Administered 2018-03-08: 100 mg via INTRAVENOUS

## 2018-03-08 MED ORDER — ACETAMINOPHEN 160 MG/5ML PO SOLN: 325.0000 mg | ORAL | Status: DC | PRN

## 2018-03-08 MED ORDER — KETOROLAC TROMETHAMINE 30 MG/ML IJ SOLN: 30.0000 mg | Freq: Once | INTRAMUSCULAR | Status: DC | PRN

## 2018-03-08 MED ORDER — RINGERS IRRIGATION IR SOLN: Status: DC | PRN

## 2018-03-08 MED ORDER — ROCURONIUM BROMIDE 10 MG/ML (PF) SYRINGE
PREFILLED_SYRINGE | INTRAVENOUS | Status: AC
  Filled 2018-03-08: qty 5

## 2018-03-08 MED ORDER — ONDANSETRON HCL 4 MG/2ML IJ SOLN: 4.0000 mg | Freq: Four times a day (QID) | INTRAMUSCULAR | Status: DC | PRN

## 2018-03-08 MED ORDER — SUGAMMADEX SODIUM 500 MG/5ML IV SOLN
INTRAVENOUS | Status: DC | PRN
  Administered 2018-03-08: 250 mg via INTRAVENOUS

## 2018-03-08 MED ORDER — MEPERIDINE HCL 50 MG/ML IJ SOLN: 6.2500 mg | INTRAMUSCULAR | Status: DC | PRN

## 2018-03-08 MED ORDER — ONDANSETRON HCL 4 MG/2ML IJ SOLN: 4.0000 mg | Freq: Once | INTRAMUSCULAR | Status: DC | PRN

## 2018-03-08 MED ORDER — OXYCODONE HCL 5 MG PO TABS: 5.0000 mg | ORAL_TABLET | Freq: Once | ORAL | Status: DC | PRN

## 2018-03-08 MED ORDER — DEXAMETHASONE SODIUM PHOSPHATE 10 MG/ML IJ SOLN
INTRAMUSCULAR | Status: DC | PRN
  Administered 2018-03-08: 10 mg via INTRAVENOUS

## 2018-03-08 MED ORDER — ONDANSETRON HCL 4 MG/2ML IJ SOLN
INTRAMUSCULAR | Status: AC
  Filled 2018-03-08: qty 2

## 2018-03-08 MED ORDER — LACTATED RINGERS IV SOLN
INTRAVENOUS | Status: DC | PRN
  Administered 2018-03-08 (×3): via INTRAVENOUS

## 2018-03-08 MED ORDER — FENTANYL CITRATE (PF) 100 MCG/2ML IJ SOLN
INTRAMUSCULAR | Status: DC | PRN
  Administered 2018-03-08: 25 ug via INTRAVENOUS
  Administered 2018-03-08: 50 ug via INTRAVENOUS
  Administered 2018-03-08: 100 ug via INTRAVENOUS
  Administered 2018-03-08: 25 ug via INTRAVENOUS

## 2018-03-08 MED ORDER — DEXAMETHASONE SODIUM PHOSPHATE 10 MG/ML IJ SOLN
INTRAMUSCULAR | Status: AC
  Filled 2018-03-08: qty 1

## 2018-03-08 MED ORDER — CELECOXIB 200 MG PO CAPS
200.0000 mg | ORAL_CAPSULE | ORAL | Status: AC
  Administered 2018-03-08: 200 mg via ORAL
  Filled 2018-03-08: qty 1

## 2018-03-08 MED ORDER — MIDAZOLAM HCL 2 MG/2ML IJ SOLN
INTRAMUSCULAR | Status: AC
  Filled 2018-03-08: qty 2

## 2018-03-08 MED ORDER — ACETAMINOPHEN 325 MG PO TABS: 325.0000 mg | ORAL_TABLET | ORAL | Status: DC | PRN

## 2018-03-08 MED ORDER — BUPIVACAINE-EPINEPHRINE (PF) 0.5% -1:200000 IJ SOLN
INTRAMUSCULAR | Status: AC
  Filled 2018-03-08: qty 30

## 2018-03-08 MED ORDER — MIDAZOLAM HCL 5 MG/5ML IJ SOLN
INTRAMUSCULAR | Status: DC | PRN
  Administered 2018-03-08: 2 mg via INTRAVENOUS

## 2018-03-08 MED ORDER — LIDOCAINE HCL 2 % IJ SOLN
INTRAMUSCULAR | Status: AC
  Filled 2018-03-08: qty 20

## 2018-03-08 MED ORDER — LOSARTAN POTASSIUM 50 MG PO TABS
50.0000 mg | ORAL_TABLET | Freq: Every day | ORAL | Status: DC
  Administered 2018-03-08 – 2018-03-12 (×5): 50 mg via ORAL
  Filled 2018-03-08 (×5): qty 1

## 2018-03-08 MED ORDER — METHOCARBAMOL 500 MG PO TABS: 500.0000 mg | ORAL_TABLET | Freq: Four times a day (QID) | ORAL | Status: DC | PRN

## 2018-03-08 MED ORDER — EPHEDRINE 5 MG/ML INJ
INTRAVENOUS | Status: AC
  Filled 2018-03-08: qty 10

## 2018-03-08 MED ORDER — FENTANYL CITRATE (PF) 100 MCG/2ML IJ SOLN: 25.0000 ug | INTRAMUSCULAR | Status: DC | PRN

## 2018-03-08 MED ORDER — ALVIMOPAN 12 MG PO CAPS
12.0000 mg | ORAL_CAPSULE | ORAL | Status: AC
  Administered 2018-03-08: 12 mg via ORAL
  Filled 2018-03-08: qty 1

## 2018-03-08 MED ORDER — PROPOFOL 10 MG/ML IV BOLUS
INTRAVENOUS | Status: AC
  Filled 2018-03-08: qty 20

## 2018-03-08 MED ORDER — HEPARIN SODIUM (PORCINE) 5000 UNIT/ML IJ SOLN
5000.0000 [IU] | Freq: Three times a day (TID) | INTRAMUSCULAR | Status: DC
  Administered 2018-03-09 – 2018-03-12 (×10): 5000 [IU] via SUBCUTANEOUS
  Filled 2018-03-08 (×10): qty 1

## 2018-03-08 MED ORDER — LIDOCAINE 20MG/ML (2%) 15 ML SYRINGE OPTIME
INTRAMUSCULAR | Status: DC | PRN
  Administered 2018-03-08: 1 mg/kg/h via INTRAVENOUS

## 2018-03-08 MED ORDER — ONDANSETRON 4 MG PO TBDP: 4.0000 mg | ORAL_TABLET | Freq: Four times a day (QID) | ORAL | Status: DC | PRN

## 2018-03-08 MED ORDER — GABAPENTIN 300 MG PO CAPS
300.0000 mg | ORAL_CAPSULE | ORAL | Status: AC
  Administered 2018-03-08: 300 mg via ORAL
  Filled 2018-03-08: qty 1

## 2018-03-08 MED ORDER — ROCURONIUM BROMIDE 100 MG/10ML IV SOLN
INTRAVENOUS | Status: DC | PRN
  Administered 2018-03-08: 60 mg via INTRAVENOUS
  Administered 2018-03-08 (×3): 20 mg via INTRAVENOUS
  Administered 2018-03-08 (×2): 10 mg via INTRAVENOUS

## 2018-03-08 MED ORDER — ACETAMINOPHEN 500 MG PO TABS
1000.0000 mg | ORAL_TABLET | ORAL | Status: AC
  Administered 2018-03-08: 1000 mg via ORAL
  Filled 2018-03-08: qty 2

## 2018-03-08 MED ORDER — ALBUMIN HUMAN 5 % IV SOLN
INTRAVENOUS | Status: DC | PRN
  Administered 2018-03-08 (×2): via INTRAVENOUS

## 2018-03-08 MED ORDER — LIDOCAINE 2% (20 MG/ML) 5 ML SYRINGE
INTRAMUSCULAR | Status: AC
  Filled 2018-03-08: qty 5

## 2018-03-08 MED ORDER — KCL IN DEXTROSE-NACL 20-5-0.9 MEQ/L-%-% IV SOLN
INTRAVENOUS | Status: DC
  Administered 2018-03-08 – 2018-03-12 (×4): via INTRAVENOUS
  Filled 2018-03-08 (×6): qty 1000

## 2018-03-08 MED ORDER — EPHEDRINE SULFATE 50 MG/ML IJ SOLN
INTRAMUSCULAR | Status: DC | PRN
  Administered 2018-03-08 (×2): 5 mg via INTRAVENOUS

## 2018-03-08 MED ORDER — FENTANYL CITRATE (PF) 250 MCG/5ML IJ SOLN
INTRAMUSCULAR | Status: AC
  Filled 2018-03-08: qty 5

## 2018-03-08 MED ORDER — MORPHINE SULFATE (PF) 4 MG/ML IV SOLN: 1.0000 mg | INTRAVENOUS | Status: DC | PRN

## 2018-03-08 MED ORDER — BUPIVACAINE-EPINEPHRINE 0.25% -1:200000 IJ SOLN
INTRAMUSCULAR | Status: DC | PRN
  Administered 2018-03-08: 30 mL

## 2018-03-08 MED ORDER — ALVIMOPAN 12 MG PO CAPS
12.0000 mg | ORAL_CAPSULE | Freq: Two times a day (BID) | ORAL | Status: DC
  Administered 2018-03-09 – 2018-03-11 (×6): 12 mg via ORAL
  Filled 2018-03-08 (×7): qty 1

## 2018-03-08 MED ORDER — PROPOFOL 10 MG/ML IV BOLUS
INTRAVENOUS | Status: DC | PRN
  Administered 2018-03-08: 150 mg via INTRAVENOUS

## 2018-03-08 MED ORDER — OXYCODONE HCL 5 MG/5ML PO SOLN
5.0000 mg | Freq: Once | ORAL | Status: DC | PRN
  Filled 2018-03-08: qty 5

## 2018-03-08 MED ORDER — KETAMINE HCL 10 MG/ML IJ SOLN
INTRAMUSCULAR | Status: AC
  Filled 2018-03-08: qty 1

## 2018-03-08 MED ORDER — CEFOTETAN DISODIUM-DEXTROSE 2-2.08 GM-%(50ML) IV SOLR
2.0000 g | INTRAVENOUS | Status: AC
  Administered 2018-03-08: 2 g via INTRAVENOUS

## 2018-03-08 MED ORDER — PANTOPRAZOLE SODIUM 40 MG IV SOLR
40.0000 mg | Freq: Every day | INTRAVENOUS | Status: DC
  Administered 2018-03-08 – 2018-03-10 (×3): 40 mg via INTRAVENOUS
  Filled 2018-03-08 (×3): qty 40

## 2018-03-08 MED ORDER — SUGAMMADEX SODIUM 500 MG/5ML IV SOLN
INTRAVENOUS | Status: AC
  Filled 2018-03-08: qty 5

## 2018-03-08 MED ORDER — ACETAMINOPHEN 325 MG PO TABS
325.0000 mg | ORAL_TABLET | ORAL | Status: DC | PRN
  Administered 2018-03-09: 650 mg via ORAL
  Filled 2018-03-08: qty 2

## 2018-03-08 MED ORDER — OXYCODONE HCL 5 MG/5ML PO SOLN: 5.0000 mg | Freq: Once | ORAL | Status: DC | PRN

## 2018-03-08 MED ORDER — KETAMINE HCL 10 MG/ML IJ SOLN
INTRAMUSCULAR | Status: DC | PRN
  Administered 2018-03-08: 40 mg via INTRAVENOUS

## 2018-03-08 MED ORDER — CEFOTETAN DISODIUM-DEXTROSE 2-2.08 GM-%(50ML) IV SOLR
2.0000 g | INTRAVENOUS | Status: DC
  Filled 2018-03-08: qty 50

## 2018-03-08 MED ORDER — LEVOTHYROXINE SODIUM 137 MCG PO TABS
137.0000 ug | ORAL_TABLET | Freq: Every day | ORAL | Status: DC
  Administered 2018-03-09 – 2018-03-12 (×4): 137 ug via ORAL
  Filled 2018-03-08 (×4): qty 1

## 2018-03-08 SURGICAL SUPPLY — 55 items
ADH SKN CLS APL DERMABOND .7 (GAUZE/BANDAGES/DRESSINGS)
APPLIER CLIP 5 13 M/L LIGAMAX5 (MISCELLANEOUS)
APPLIER CLIP ROT 10 11.4 M/L (STAPLE)
APR CLP MED LRG 11.4X10 (STAPLE)
BLADE EXTENDED COATED 6.5IN (ELECTRODE) IMPLANT
CABLE HIGH FREQUENCY MONO STRZ (ELECTRODE) ×2 IMPLANT
CELLS DAT CNTRL 66122 CELL SVR (MISCELLANEOUS) IMPLANT
CLIP APPLIE 5 13 M/L LIGAMAX5 (MISCELLANEOUS) IMPLANT
CLIP APPLIE ROT 10 11.4 M/L (STAPLE) IMPLANT
COUNTER NEEDLE 20 DBL MAG RED (NEEDLE) ×2 IMPLANT
COVER MAYO STAND STRL (DRAPES) ×6 IMPLANT
DECANTER SPIKE VIAL GLASS SM (MISCELLANEOUS) ×2 IMPLANT
DERMABOND ADVANCED (GAUZE/BANDAGES/DRESSINGS)
DERMABOND ADVANCED .7 DNX12 (GAUZE/BANDAGES/DRESSINGS) IMPLANT
DRAPE LAPAROSCOPIC ABDOMINAL (DRAPES) ×2 IMPLANT
DRSG OPSITE POSTOP 4X10 (GAUZE/BANDAGES/DRESSINGS) IMPLANT
DRSG OPSITE POSTOP 4X6 (GAUZE/BANDAGES/DRESSINGS) ×2 IMPLANT
DRSG OPSITE POSTOP 4X8 (GAUZE/BANDAGES/DRESSINGS) IMPLANT
ELECT PENCIL ROCKER SW 15FT (MISCELLANEOUS) ×4 IMPLANT
ELECT REM PT RETURN 15FT ADLT (MISCELLANEOUS) ×2 IMPLANT
GAUZE SPONGE 2X2 8PLY STRL LF (GAUZE/BANDAGES/DRESSINGS) IMPLANT
GAUZE SPONGE 4X4 12PLY STRL (GAUZE/BANDAGES/DRESSINGS) IMPLANT
GLOVE BIO SURGEON STRL SZ7.5 (GLOVE) ×4 IMPLANT
GOWN STRL REUS W/TWL XL LVL3 (GOWN DISPOSABLE) ×8 IMPLANT
LEGGING LITHOTOMY PAIR STRL (DRAPES) ×2 IMPLANT
LIGASURE IMPACT 36 18CM CVD LR (INSTRUMENTS) ×2 IMPLANT
LUBRICANT JELLY K Y 4OZ (MISCELLANEOUS) IMPLANT
PACK COLON (CUSTOM PROCEDURE TRAY) ×2 IMPLANT
PORT LAP GEL ALEXIS MED 5-9CM (MISCELLANEOUS) ×2 IMPLANT
RELOAD PROXIMATE 75MM BLUE (ENDOMECHANICALS) ×2 IMPLANT
RTRCTR WOUND ALEXIS 18CM MED (MISCELLANEOUS)
SCISSORS LAP 5X35 DISP (ENDOMECHANICALS) ×2 IMPLANT
SET IRRIG TUBING LAPAROSCOPIC (IRRIGATION / IRRIGATOR) ×2 IMPLANT
SHEARS HARMONIC ACE PLUS 36CM (ENDOMECHANICALS) ×2 IMPLANT
SLEEVE XCEL OPT CAN 5 100 (ENDOMECHANICALS) ×4 IMPLANT
SPONGE GAUZE 2X2 STER 10/PKG (GAUZE/BANDAGES/DRESSINGS)
STAPLER CIRC CVD 29MM 37CM (STAPLE) ×2 IMPLANT
STAPLER PROXIMATE 75MM BLUE (STAPLE) ×2 IMPLANT
STAPLER VISISTAT 35W (STAPLE) IMPLANT
SUT PDS AB 1 CTX 36 (SUTURE) IMPLANT
SUT PDS AB 1 TP1 96 (SUTURE) ×4 IMPLANT
SUT PROLENE 2 0 SH DA (SUTURE) ×2 IMPLANT
SUT SILK 2 0 (SUTURE) ×1
SUT SILK 2 0 SH CR/8 (SUTURE) ×2 IMPLANT
SUT SILK 2-0 18XBRD TIE 12 (SUTURE) ×1 IMPLANT
SUT SILK 3 0 (SUTURE) ×2
SUT SILK 3 0 SH CR/8 (SUTURE) ×2 IMPLANT
SUT SILK 3-0 18XBRD TIE 12 (SUTURE) ×1 IMPLANT
SUT VIC AB 0 UR5 27 (SUTURE) ×2 IMPLANT
TOWEL OR NON WOVEN STRL DISP B (DISPOSABLE) ×2 IMPLANT
TRAY FOLEY W/METER SILVER 16FR (SET/KITS/TRAYS/PACK) ×2 IMPLANT
TROCAR BLADELESS OPT 5 100 (ENDOMECHANICALS) ×2 IMPLANT
TROCAR XCEL NON-BLD 11X100MML (ENDOMECHANICALS) IMPLANT
TUBING CONNECTING 10 (TUBING) IMPLANT
TUBING INSUF HEATED (TUBING) ×2 IMPLANT

## 2018-03-08 NOTE — H&P (Signed)
Christian Weeks  Location: [facility] Patient #: [id number] DOB: 10/04/1951 Single / Language: Cleophus Molt / Race: White Male   History of Present Illness  The patient is a 67 year old male who presents for a follow-up for Abdominal mass. We are asked to see the patient in consultation by Dr. Clarene Essex to evaluate him for a sigmoid colon mass. The patient is a 67 year old white male who recently did a cold guard test which was positive. He was then set up for a colonoscopy. He had never had a colonoscopy before. The colonoscopy found some diverticuli and several polyps throughout the colon which were removed. It also found a mass in the mid sigmoid colon that was nonobstructing but very worrisome for cancer. This was biopsied and the pathology is pending. He denies any abdominal pain. His appetite is been good. Over the last 6 months he has been having a bowel movement about every other day where he used to have him every day. He denies any blood in the stool. His weight has been stable. He has no family history of colon cancers or other types of cancers. He does not smoke.   Problem List/Past Medical  SEBACEOUS CYST (L72.3)  MASS OF COLON (K63.9)   Past Surgical History  Knee Surgery  Bilateral.  Diagnostic Studies History Colonoscopy  never  Allergies  NKDA  Medication History  Cholecalciferol (3000UNIT Tablet, Oral) Active. Levothyroxine Sodium (137MCG Capsule, Oral) Active. Losartan Potassium (50MG  Tablet, Oral) Active. Apixaban (2.5MG  Tablet, Oral) Active.  Social History  Alcohol use  Remotely quit alcohol use. Caffeine use  Tea. No drug use  Tobacco use  Former smoker.  Family History  Thyroid problems  Mother, Sister.  Other Problems Cerebrovascular Accident  High blood pressure  Thyroid Disease     Review of Systems General Not Present- Appetite Loss, Chills, Fatigue, Fever, Night Sweats, Weight Gain and Weight  Loss. Skin Present- New Lesions. Not Present- Change in Wart/Mole, Dryness, Hives, Jaundice, Non-Healing Wounds, Rash and Ulcer. HEENT Not Present- Earache, Hearing Loss, Hoarseness, Nose Bleed, Oral Ulcers, Ringing in the Ears, Seasonal Allergies, Sinus Pain, Sore Throat, Visual Disturbances, Wears glasses/contact lenses and Yellow Eyes. Respiratory Not Present- Bloody sputum, Chronic Cough, Difficulty Breathing, Snoring and Wheezing. Breast Not Present- Breast Mass, Breast Pain, Nipple Discharge and Skin Changes. Cardiovascular Not Present- Chest Pain, Difficulty Breathing Lying Down, Leg Cramps, Palpitations, Rapid Heart Rate, Shortness of Breath and Swelling of Extremities. Gastrointestinal Not Present- Abdominal Pain, Bloating, Bloody Stool, Change in Bowel Habits, Chronic diarrhea, Constipation, Difficulty Swallowing, Excessive gas, Gets full quickly at meals, Hemorrhoids, Indigestion, Nausea, Rectal Pain and Vomiting. Male Genitourinary Not Present- Blood in Urine, Change in Urinary Stream, Frequency, Impotence, Nocturia, Painful Urination, Urgency and Urine Leakage. Musculoskeletal Not Present- Back Pain, Joint Pain, Joint Stiffness, Muscle Pain, Muscle Weakness and Swelling of Extremities. Neurological Not Present- Decreased Memory, Fainting, Headaches, Numbness, Seizures, Tingling, Tremor, Trouble walking and Weakness. Psychiatric Not Present- Anxiety, Bipolar, Change in Sleep Pattern, Depression, Fearful and Frequent crying. Endocrine Not Present- Cold Intolerance, Excessive Hunger, Hair Changes, Heat Intolerance and New Diabetes. Hematology Not Present- Blood Thinners, Easy Bruising, Excessive bleeding, Gland problems, HIV and Persistent Infections.   Physical Exam  General Mental Status-Alert. General Appearance-Consistent with stated age. Hydration-Well hydrated. Voice-Normal.  Head and Neck Head-normocephalic, atraumatic with no lesions or palpable  masses. Trachea-midline. Thyroid Gland Characteristics - normal size and consistency.  Eye Eyeball - Bilateral-Extraocular movements intact. Sclera/Conjunctiva - Bilateral-No scleral icterus.  Chest  and Lung Exam Chest and lung exam reveals -quiet, even and easy respiratory effort with no use of accessory muscles and on auscultation, normal breath sounds, no adventitious sounds and normal vocal resonance. Inspection Chest Wall - Normal. Back - normal.  Cardiovascular Cardiovascular examination reveals -normal heart sounds, regular rate and rhythm with no murmurs and normal pedal pulses bilaterally.  Abdomen Note: The abdomen is soft and nontender. There is no palpable mass. There is no palpable groin or supraclavicular lymphadenopathy.   Neurologic Neurologic evaluation reveals -alert and oriented x 3 with no impairment of recent or remote memory. Mental Status-Normal.  Musculoskeletal Normal Exam - Left-Upper Extremity Strength Normal and Lower Extremity Strength Normal. Normal Exam - Right-Upper Extremity Strength Normal and Lower Extremity Strength Normal.  Lymphatic Head & Neck  General Head & Neck Lymphatics: Bilateral - Description - Normal. Axillary  General Axillary Region: Bilateral - Description - Normal. Tenderness - Non Tender. Femoral & Inguinal  Generalized Femoral & Inguinal Lymphatics: Bilateral - Description - Normal. Tenderness - Non Tender.    Assessment & Plan MASS OF COLON (K63.9) Impression: The patient appears to have a mass of the mid sigmoid colon that is very worrisome for a cancer. Because of this I would recommend that this area be removed. He would also like to have this done. I have discussed with him in detail the risks and benefits of the operation as well as some of the technical aspects and he understands and wishes to proceed. His pathology should be available in the next couple days. He had a CT scan of the abdomen and  pelvis earlier today and I will review this with radiology. If this shows no significant evidence of disease outside of the colon then I would plan for a laparoscopic assisted sigmoid colectomy. I will check liver functions and a CEA level prior to surgery. He has had a recent CT scan of the chest that looked benign. He also has a descending thoracic aneurysm and is followed by Dr. Servando Snare. I will get clearance from Dr. Servando Snare prior surgery. Current Plans Pt Education - Colonic Diseases: discussed with patient and provided information.

## 2018-03-08 NOTE — Transfer of Care (Signed)
Immediate Anesthesia Transfer of Care Note  Patient: Christian Weeks  Procedure(s) Performed: LAPAROSCOPIC ASSISTED SIGMOID COLECTOMY ERAS PATHWAY (N/A )  Patient Location: PACU  Anesthesia Type:General  Level of Consciousness: awake, alert  and oriented  Airway & Oxygen Therapy: Patient Spontanous Breathing and Patient connected to face mask oxygen  Post-op Assessment: Report given to RN and Post -op Vital signs reviewed and stable  Post vital signs: Reviewed and stable  Last Vitals:  Vitals:   03/08/18 0554 03/08/18 1126  BP: (!) 127/94 118/76  Pulse: 76 69  Resp: 18 16  Temp: 36.5 C 36.8 C  SpO2: 96% 96%    Last Pain:  Vitals:   03/08/18 0554  TempSrc: Oral      Patients Stated Pain Goal: 4 (41/63/84 5364)  Complications: No apparent anesthesia complications

## 2018-03-08 NOTE — Anesthesia Procedure Notes (Signed)
Procedure Name: Intubation Date/Time: 03/08/2018 7:45 AM Performed by: Glory Buff, CRNA Pre-anesthesia Checklist: Patient identified, Emergency Drugs available, Suction available and Patient being monitored Patient Re-evaluated:Patient Re-evaluated prior to induction Oxygen Delivery Method: Circle system utilized Preoxygenation: Pre-oxygenation with 100% oxygen Induction Type: IV induction Ventilation: Mask ventilation without difficulty Laryngoscope Size: Miller and 3 Grade View: Grade I Tube type: Oral Tube size: 7.5 mm Number of attempts: 1 Airway Equipment and Method: Stylet and Oral airway Placement Confirmation: ETT inserted through vocal cords under direct vision,  positive ETCO2 and breath sounds checked- equal and bilateral Secured at: 22 (at gums) cm Tube secured with: Tape Dental Injury: Teeth and Oropharynx as per pre-operative assessment

## 2018-03-08 NOTE — Anesthesia Postprocedure Evaluation (Signed)
Anesthesia Post Note  Patient: DYLLEN MENNING  Procedure(s) Performed: LAPAROSCOPIC ASSISTED SIGMOID COLECTOMY ERAS PATHWAY (N/A )     Patient location during evaluation: PACU Anesthesia Type: General Level of consciousness: awake and alert Pain management: pain level controlled Vital Signs Assessment: post-procedure vital signs reviewed and stable Respiratory status: spontaneous breathing, nonlabored ventilation, respiratory function stable and patient connected to nasal cannula oxygen Cardiovascular status: blood pressure returned to baseline and stable Postop Assessment: no apparent nausea or vomiting Anesthetic complications: no    Last Vitals:  Vitals:   03/08/18 0554 03/08/18 1126  BP: (!) 127/94 118/76  Pulse: 76 69  Resp: 18 16  Temp: 36.5 C 36.8 C  SpO2: 96% 96%    Last Pain:  Vitals:   03/08/18 1126  TempSrc:   PainSc: (P) 0-No pain                 Caren Garske

## 2018-03-08 NOTE — Op Note (Addendum)
03/08/2018  11:30 AM  PATIENT:  Christian Weeks  67 y.o. male  PRE-OPERATIVE DIAGNOSIS:  Sigmoid colon mass  POST-OPERATIVE DIAGNOSIS:  Sigmoid colon mass  PROCEDURE:  Procedure(s): LAPAROSCOPIC ASSISTED SIGMOID COLECTOMY ERAS PATHWAY  MOBILIZATION OF SPLENIC FLEXURE RIGID SIGMOIDOSCOPY  SURGEON:  Surgeon(s) and Role:    * Jovita Kussmaul, MD - Primary    * Mcarthur Rossetti, MD - Assisting  PHYSICIAN ASSISTANT:   ASSISTANTS: Dr. Ninfa Linden and Dr. Dema Severin   ANESTHESIA:   local and general  EBL:  150 mL   BLOOD ADMINISTERED:none  DRAINS: none   LOCAL MEDICATIONS USED:  MARCAINE     SPECIMEN:  Source of Specimen:  sigmoid colon  DISPOSITION OF SPECIMEN:  PATHOLOGY  COUNTS:  YES  TOURNIQUET:  * No tourniquets in log *  DICTATION: .Dragon Dictation   After informed consent was obtained the patient was brought to the operating room and placed in the supine position on the operating table.  After adequate induction of general anesthesia the patient was moved in the lithotomy position and all pressure points were padded.  The abdomen and perineum were then prepped with ChloraPrep and Betadine, allowed to dry, and draped in usual sterile manner.  An appropriate timeout was performed.  I accessed the abdomen in the right upper quadrant with a 5 mm Optiview port and camera bluntly dissecting through the layers of the abdominal wall under direct vision.  The abdomen was then insufflated with carbon dioxide without difficulty.  2 other 5 mm ports were placed on the right side of the abdomen under direct vision.  I was then able to mobilize the left colon by incising its retroperitoneal attachment along the white line of Toldt.  I was able to take down the splenic flexure doing this as well.  Some of the splenic flexure was separated from the omentum to allow for good mobilization.  Next a small lower midline incision was made with a 10 blade knife.  The incision was carried through the  skin and subcutaneous tissue sharply with electrocautery until the fascia of the abdominal wall was encountered.  The fascia of the abdominal wall was also incised with electrocautery and the preperitoneal space was probed bluntly with the finger until the peritoneum was opened and access was gained to the abdominal cavity.  A wound protector was deployed.  The rest of the sigmoid and rectum were mobilized near the base of the mesentery.  We were able to identify the left ureter and stay well away from that.  Next we chose areas above and below the area of tattooing for division of the rectosigmoid and sigmoid colon.  Each area was divided by a single firing of a GIA-75 stapler.  The mesentery was then taken down sharply with the LigaSure.  The specimen was opened on the back table and there appeared to be nice wide margin around the cancer.  Next the staple line of the proximal colon was removed with an Allen clamp and 15 blade knife.  The proximal colon was sized and we chose a 29 mm EEA stapler.  A 2-0 Prolene pursestring stitch was placed around the cut edge of the proximal colon.  The anvil was placed inside the lumen and the pursestring stitch was cinched down and tied around the neck.  Next a 29 mm EEA stapler was brought in through the rectum and the spike was deployed along the anterior wall of the rectum just below the staple line.  The 2 ends of the stapler was then placed together, the stapler was closed into the green zone and then fired.  This created a nice widely patent enteroenterostomy.  The stapling device was removed.  The staple line was reinforced with multiple 2-0 silk stitches.  The pelvis was then filled with sterile water and Dr. Ninfa Linden performed a rigid sigmoidoscopy and insufflated air into the rectum and sigmoid area.  The bowel proximal to the anastomosis was clamped with a soft bowel clamp and with the insufflation there were no bubbles noted.  The anastomosis appeared healthy.  The  rigid sigmoidoscope was removed.  The abdomen was irrigated with copious amounts of water and saline.  At this point all drapes gowns and gloves were changed.  The abdomen was generally inspected and no other abnormalities were noted.  The peritoneum was then closed with a 0 Vicryl stitch.  The fascia of the abdominal wall was then closed with 2 running #1 double-stranded loop PDS sutures.  The subcutaneous tissue was irrigated with copious amounts of saline.  The skin was closed with staples and sterile dressings were applied.  The patient tolerated the procedure well.  At the end of the case all needle sponge and instrument counts were correct.  The patient was then awakened and taken to recovery in stable condition.  PLAN OF CARE: Admit to inpatient   PATIENT DISPOSITION:  PACU - hemodynamically stable.   Delay start of Pharmacological VTE agent (>24hrs) due to surgical blood loss or risk of bleeding: no

## 2018-03-08 NOTE — Interval H&P Note (Signed)
History and Physical Interval Note:  03/08/2018 7:11 AM  Christian Weeks  has presented today for surgery, with the diagnosis of Sigmoid colon mass  The various methods of treatment have been discussed with the patient and family. After consideration of risks, benefits and other options for treatment, the patient has consented to  Procedure(s): [name] (N/A) as a surgical intervention .  The patient's history has been reviewed, patient examined, no change in status, stable for surgery.  I have reviewed the patient's chart and labs.  Questions were answered to the patient's satisfaction.     TOTH III,Orville Widmann S

## 2018-03-09 LAB — BASIC METABOLIC PANEL
ANION GAP: 9 (ref 5–15)
BUN: 11 mg/dL (ref 6–20)
CHLORIDE: 107 mmol/L (ref 101–111)
CO2: 22 mmol/L (ref 22–32)
Calcium: 8.8 mg/dL — ABNORMAL LOW (ref 8.9–10.3)
Creatinine, Ser: 1.12 mg/dL (ref 0.61–1.24)
GFR calc Af Amer: >60 mL/min (ref >60)
GLUCOSE: 131 mg/dL — AB (ref 65–99)
POTASSIUM: 4.2 mmol/L (ref 3.5–5.1)
SODIUM: 138 mmol/L (ref 135–145)

## 2018-03-09 LAB — CBC
HEMATOCRIT: 41.1 % (ref 39.0–52.0)
HEMOGLOBIN: 14.2 g/dL (ref 13.0–17.0)
MCH: 31.1 pg (ref 26.0–34.0)
MCHC: 34.5 g/dL (ref 30.0–36.0)
MCV: 89.9 fL (ref 78.0–100.0)
Platelets: 202 10*3/uL (ref 150–400)
RBC: 4.57 MIL/uL (ref 4.22–5.81)
RDW: 13.3 % (ref 11.5–15.5)
WBC: 13.6 10*3/uL — AB (ref 4.0–10.5)

## 2018-03-09 NOTE — Progress Notes (Signed)
1 Day Post-Op   Subjective/Chief Complaint: No complaints   Objective: Vital signs in last 24 hours: Temp:  [97.6 F (36.4 C)-98.9 F (37.2 C)] 98 F (36.7 C) (03/15 0542) Pulse Rate:  [56-76] 56 (03/15 0542) Resp:  [14-20] 16 (03/15 0542) BP: (98-118)/(61-76) 103/68 (03/15 0542) SpO2:  [94 %-100 %] 100 % (03/15 0542) Last BM Date: 03/08/18  Intake/Output from previous day: 03/14 0701 - 03/15 0700 In: 3953.8 [P.O.:360; I.V.:3043.8; IV Piggyback:550] Out: 2815 [Urine:2665; Blood:150] Intake/Output this shift: No intake/output data recorded.  General appearance: alert and cooperative Resp: clear to auscultation bilaterally Cardio: regular rate and rhythm GI: soft, mild tenderness. incisions look good. good bs  Lab Results:  Recent Labs    03/07/18 0804 03/09/18 0441  WBC 6.9 13.6*  HGB 15.9 14.2  HCT 45.9 41.1  PLT 224 202   BMET Recent Labs    03/07/18 0804 03/09/18 0441  NA 138 138  K 4.1 4.2  CL 106 107  CO2 24 22  GLUCOSE 106* 131*  BUN 12 11  CREATININE 1.05 1.12  CALCIUM 9.1 8.8*   PT/INR No results for input(s): LABPROT, INR in the last 72 hours. ABG No results for input(s): PHART, HCO3 in the last 72 hours.  Invalid input(s): PCO2, PO2  Studies/Results: No results found.  Anti-infectives: Anti-infectives (From admission, onward)   Start     Dose/Rate Route Frequency Ordered Stop   03/08/18 0557  cefoTEtan in Dextrose 5% (CEFOTAN) IVPB 2 g     2 g Intravenous On call to O.R. 03/08/18 0557 03/08/18 0817   03/08/18 0557  cefoTEtan in Dextrose 5% (CEFOTAN) IVPB 2 g  Status:  Discontinued     2 g Intravenous On call to O.R. 03/08/18 0557 03/08/18 1252      Assessment/Plan: s/p Procedure(s): LAPAROSCOPIC ASSISTED SIGMOID COLECTOMY ERAS PATHWAY (N/A) Continue clears until bowel function returns  POD 1 OOB/ambulate D/c foley Continue entereg  LOS: 1 day    TOTH III,Harshita Bernales S 03/09/2018

## 2018-03-10 LAB — GLUCOSE, CAPILLARY: Glucose-Capillary: 146 mg/dL — ABNORMAL HIGH (ref 65–99)

## 2018-03-10 NOTE — Progress Notes (Signed)
Patient ID: Christian Weeks, male   DOB: 1951-05-05, 67 y.o.   MRN: [id number] [facility] Progress Note:   2 Days Post-Op  Subjective: Mental status is clear Objective: Vital signs in last 24 hours: Temp:  [98.1 F (36.7 C)-98.8 F (37.1 C)] 98.2 F (36.8 C) (03/16 0529) Pulse Rate:  [56-59] 58 (03/16 0529) Resp:  [16-17] 16 (03/16 0529) BP: (128-132)/(77-96) 132/91 (03/16 0529) SpO2:  [98 %-100 %] 100 % (03/16 0529)  Intake/Output from previous day: 03/15 0701 - 03/16 0700 In: 2850 [P.O.:1050; I.V.:1800] Out: 2450 [Urine:2450] Intake/Output this shift: No intake/output data recorded.  Physical Exam: Work of breathing is normal;  Taking clears and had flatus last night and this am  Lab Results:  Results for orders placed or performed during the hospital encounter of 03/08/18 (from the past 48 hour(s))  Basic metabolic panel     Status: Abnormal   Collection Time: 03/09/18  4:41 AM  Result Value Ref Range   Sodium 138 135 - 145 mmol/L   Potassium 4.2 3.5 - 5.1 mmol/L   Chloride 107 101 - 111 mmol/L   CO2 22 22 - 32 mmol/L   Glucose, Bld 131 (H) 65 - 99 mg/dL   BUN 11 6 - 20 mg/dL   Creatinine, Ser 1.12 0.61 - 1.24 mg/dL   Calcium 8.8 (L) 8.9 - 10.3 mg/dL   GFR calc non Af Amer >60 >60 mL/min   GFR calc Af Amer >60 >60 mL/min    Comment: (NOTE) The eGFR has been calculated using the CKD EPI equation. This calculation has not been validated in all clinical situations. eGFR's persistently <60 mL/min signify possible Chronic Kidney Disease.    Anion gap 9 5 - 15    Comment: Performed at [facility] [place], [place] [address] [place], [place] [address]  CBC     Status: Abnormal   Collection Time: 03/09/18  4:41 AM  Result Value Ref Range   WBC 13.6 (H) 4.0 - 10.5 K/uL   RBC 4.57 4.22 - 5.81 MIL/uL   Hemoglobin 14.2 13.0 - 17.0 g/dL   HCT 41.1 39.0 - 52.0 %   MCV 89.9 78.0 - 100.0 fL   MCH 31.1 26.0 - 34.0 pg   MCHC 34.5 30.0 - 36.0 g/dL   RDW  13.3 11.5 - 15.5 %   Platelets 202 150 - 400 K/uL    Comment: Performed at [facility], [place] [address], [place], [place] [address]    Radiology/Results: No results found.  Anti-infectives: Anti-infectives (From admission, onward)   Start     Dose/Rate Route Frequency Ordered Stop   03/08/18 0557  cefoTEtan in Dextrose 5% (CEFOTAN) IVPB 2 g     2 g Intravenous On call to O.R. 03/08/18 0557 03/08/18 0817   03/08/18 0557  cefoTEtan in Dextrose 5% (CEFOTAN) IVPB 2 g  Status:  Discontinued     2 g Intravenous On call to O.R. 03/08/18 0557 03/08/18 1252      Assessment/Plan: Problem List: Patient Active Problem List   Diagnosis Date Noted  . Colon cancer ([place]) 03/08/2018  . Thoracic aortic aneurysm without rupture ([place]) 02/21/2017  . Arthritis of right knee 01/13/2017  . Unilateral primary osteoarthritis, right knee 10/25/2016  . DVT (deep venous thrombosis), left 04/13/2016  . Cerebrovascular accident (CVA) due to embolism of cerebral artery ([place]) 04/13/2016  . S/P knee surgery 04/13/2016  . PFO (patent foramen ovale)   . HLD (hyperlipidemia)   .  Acute CVA (cerebrovascular accident) ([place]) 02/06/2016  . TIA (transient ischemic attack) 02/05/2016  . Stroke ([place]) 02/05/2016  . Hypothyroidism   . Arthritis   . Calf pain   . S/P total knee arthroplasty, right 01/15/2016    Advance to full liquids 2 Days Post-Op    LOS: 2 days   Matt B. Hassell Done, MD, [facility] [phone] beeper [phone]  03/10/2018 8:41 AM

## 2018-03-11 MED ORDER — PANTOPRAZOLE SODIUM 40 MG PO TBEC
40.0000 mg | DELAYED_RELEASE_TABLET | Freq: Every day | ORAL | Status: DC
  Administered 2018-03-11: 40 mg via ORAL
  Filled 2018-03-11: qty 1

## 2018-03-11 NOTE — Progress Notes (Signed)
Pt checked frequently, rested quietly at long intervals with eyes closed, resp even and unlabored. Awake and ambulating in the halls, states "trying to get things moving to help myself." Pt able to pass flatus, denies bowel movement at this time. No acute distress noted.

## 2018-03-11 NOTE — Progress Notes (Signed)
Patient ID: Christian Weeks, male   DOB: July 26, 1951, 67 y.o.   MRN: [id number] [facility] Progress Note:   3 Days Post-Op  Subjective: Mental status is clear.  Up and about Objective: Vital signs in last 24 hours: Temp:  [97.4 F (36.3 C)-98.5 F (36.9 C)] 97.9 F (36.6 C) (03/17 0550) Pulse Rate:  [64-74] 68 (03/17 0755) Resp:  [16-17] 16 (03/17 0550) BP: (104-125)/(75-88) 104/78 (03/17 0755) SpO2:  [95 %-99 %] 99 % (03/17 0550)  Intake/Output from previous day: 03/16 0701 - 03/17 0700 In: 1169.5 [P.O.:882; I.V.:287.5] Out: 2025 [Urine:2025] Intake/Output this shift: Total I/O In: 120 [P.O.:120] Out: 600 [Urine:600]  Physical Exam: Work of breathing is normal;  Incisions OK  Lab Results:  Results for orders placed or performed during the hospital encounter of 03/08/18 (from the past 48 hour(s))  Glucose, capillary     Status: Abnormal   Collection Time: 03/10/18 12:00 PM  Result Value Ref Range   Glucose-Capillary 146 (H) 65 - 99 mg/dL    Radiology/Results: No results found.  Anti-infectives: Anti-infectives (From admission, onward)   Start     Dose/Rate Route Frequency Ordered Stop   03/08/18 0557  cefoTEtan in Dextrose 5% (CEFOTAN) IVPB 2 g     2 g Intravenous On call to O.R. 03/08/18 0557 03/08/18 0817   03/08/18 0557  cefoTEtan in Dextrose 5% (CEFOTAN) IVPB 2 g  Status:  Discontinued     2 g Intravenous On call to O.R. 03/08/18 0557 03/08/18 1252      Assessment/Plan: Problem List: Patient Active Problem List   Diagnosis Date Noted  . Colon cancer ([place]) 03/08/2018  . Thoracic aortic aneurysm without rupture ([place]) 02/21/2017  . Arthritis of right knee 01/13/2017  . Unilateral primary osteoarthritis, right knee 10/25/2016  . DVT (deep venous thrombosis), left 04/13/2016  . Cerebrovascular accident (CVA) due to embolism of cerebral artery ([place]) 04/13/2016  . S/P knee surgery 04/13/2016  . PFO (patent foramen ovale)   . HLD (hyperlipidemia)   .  Acute CVA (cerebrovascular accident) ([place]) 02/06/2016  . TIA (transient ischemic attack) 02/05/2016  . Stroke ([place]) 02/05/2016  . Hypothyroidism   . Arthritis   . Calf pain   . S/P total knee arthroplasty, right 01/15/2016    Passing gas;  Will offer solid diet 3 Days Post-Op    LOS: 3 days   Matt B. Hassell Done, MD, [facility] [phone] beeper [phone]  03/11/2018 12:21 PM

## 2018-03-11 NOTE — Progress Notes (Signed)
PHARMACIST - PHYSICIAN COMMUNICATION  DR:   Marlou Starks  CONCERNING: IV to Oral Route Change Policy  RECOMMENDATION: This patient is receiving Protonix by the intravenous route.  Based on criteria approved by the Pharmacy and Therapeutics Committee, the intravenous medication(s) is/are being converted to the equivalent oral dose form(s).   DESCRIPTION: These criteria include:  The patient is eating (either orally or via tube) and/or has been taking other orally administered medications for a least 24 hours  The patient has no evidence of active gastrointestinal bleeding or impaired GI absorption (gastrectomy, short bowel, patient on TNA or NPO).  If you have questions about this conversion, please contact the Pharmacy Department  []   [phone] )  [facility] []   [phone] )  [facility] []   [phone] )  [facility] []   [phone] )  [facility] [x]   [phone] )  [place], [place], [facility] 03/11/2018 10:28 AM

## 2018-03-12 MED ORDER — TRAMADOL HCL 50 MG PO TABS: 50.0000 mg | ORAL_TABLET | Freq: Four times a day (QID) | ORAL | 0 refills | Status: DC | PRN

## 2018-03-12 NOTE — Discharge Instructions (Signed)
POST OP INSTRUCTIONS  1. DIET: As tolerated. Follow a light bland diet the first 24 hours after arrival home, such as soup, liquids, crackers, etc.  Be sure to include lots of fluids daily.  Avoid fast food or heavy meals as your are more likely to get nauseated.  Eat a low fat the next few days after surgery.  2. Take your usually prescribed home medications unless otherwise directed.  3. PAIN CONTROL: a. Pain is best controlled by a usual combination of three different methods TOGETHER: i. Ice/Heat ii. Over the counter pain medication iii. Prescription pain medication b. Most patients will experience some swelling and bruising around the surgical site.  Ice packs or heating pads (30-60 minutes up to 6 times a day) will help. Some people prefer to use ice alone, heat alone, alternating between ice & heat.  Experiment to what works for you.  Swelling and bruising can take several weeks to resolve.   c. It is helpful to take an over-the-counter pain medication regularly for the first few weeks: i. Ibuprofen (Motrin/Advil) - 200mg  tabs - take 3 tabs (600mg ) every 6 hours as needed for pain ii. Acetaminophen (Tylenol) - you may take 650mg  every 6 hours as needed. You can take this with motrin as they act differently on the body. If you are taking a narcotic pain medication that has acetaminophen in it, do not take over the counter tylenol at the same time.  Iii. NOTE: You may take both of these medications together - most patients  find it most helpful when alternating between the two (i.e. Ibuprofen at 6am,  tylenol at 9am, ibuprofen at 12pm ...) d. A  prescription for pain medication should be given to you upon discharge.  Take your pain medication as prescribed if your pain is not adequatly controlled with the over-the-counter pain reliefs mentioned above.  4. Avoid getting constipated.  Between the surgery and the pain medications, it is common to experience some constipation.  Increasing fluid  intake and taking a fiber supplement (such as Metamucil, Citrucel, FiberCon, MiraLax, etc) 1-2 times a day regularly will usually help prevent this problem from occurring.  A mild laxative (prune juice, Milk of Magnesia, MiraLax, etc) should be taken according to package directions if there are no bowel movements after 48 hours.    5. Dressing: Your incision is covered in Dermabond which is like sterile superglue for the skin. This will come off on it's own in a couple weeks. It is waterproof and you may bathe normally starting the day after your surgery in a shower. Avoid baths/pools/lakes/oceans until your wounds have fully healed.  6. ACTIVITIES as tolerated:   a. Avoid heavy lifting (>10lbs or 1 gallon of milk) for the next 6 weeks. b. You may resume regular (light) daily activities beginning the next day--such as daily self-care, walking, climbing stairs--gradually increasing activities as tolerated.  If you can walk 30 minutes without difficulty, it is safe to try more intense activity such as jogging, treadmill, bicycling, low-impact aerobics.  c. DO NOT PUSH THROUGH PAIN.  Let pain be your guide: If it hurts to do something, don't do it. d. Dennis Bast may drive when you are no longer taking prescription pain medication, you can comfortably wear a seatbelt, and you can safely maneuver your car and apply brakes. e. Dennis Bast may have sexual intercourse when it is comfortable.   7. FOLLOW UP in our office a. Please call [facility] at [phone] to set up an appointment  to see your surgeon in the office for a follow-up appointment approximately 2 weeks after your surgery. b. Make sure that you call for this appointment the day you arrive home to insure a convenient appointment time.  9. If you have disability or family leave forms that need to be completed, you may have them completed by your primary care physician's office; for return to work instructions, please ask our office staff and they will be happy to  assist you in obtaining this documentation   When to call us [phone]: 1. Poor pain control 2. Reactions / problems with new medications (rash/itching, etc)  3. Fever over 101.5 F (38.5 C) 4. Inability to urinate 5. Nausea/vomiting 6. Worsening swelling or bruising 7. Continued bleeding from incision. 8. Increased pain, redness, or drainage from the incision  The clinic staff is available to answer your questions during regular business hours (8:30am-5pm).  Please dont hesitate to call and ask to speak to one of our nurses for clinical concerns.   A surgeon from [facility] is always on call at the hospitals   If you have a medical emergency, go to the nearest emergency room or call 911.  [facility], [place] [address] [place], [place], [place]  [address] MAIN: [phone] FAX: [phone] [email or web address]

## 2018-03-12 NOTE — Progress Notes (Signed)
Discharge instructions reviewed with the patient. All questions answered. Patient wheeled to vehicle with belongings by nurse tech.

## 2018-03-12 NOTE — Discharge Summary (Signed)
Patient ID: Christian Weeks MRN: [id number] DOB/AGE: Jul 28, 1951 67 y.o.  Admit date: 03/08/2018 Discharge date: 03/12/2018  Discharge Diagnoses Patient Active Problem List   Diagnosis Date Noted  . Colon cancer ([place]) 03/08/2018  . Thoracic aortic aneurysm without rupture ([place]) 02/21/2017  . Arthritis of right knee 01/13/2017  . Unilateral primary osteoarthritis, right knee 10/25/2016  . DVT (deep venous thrombosis), left 04/13/2016  . Cerebrovascular accident (CVA) due to embolism of cerebral artery ([place]) 04/13/2016  . S/P knee surgery 04/13/2016  . PFO (patent foramen ovale)   . HLD (hyperlipidemia)   . Acute CVA (cerebrovascular accident) ([place]) 02/06/2016  . TIA (transient ischemic attack) 02/05/2016  . Stroke ([place]) 02/05/2016  . Hypothyroidism   . Arthritis   . Calf pain   . S/P total knee arthroplasty, right 01/15/2016    Consultants none  Procedures Lap assisted sigmoid colectomy and rigid proctosig 03/08/18  Hospital Course:  He was admitted postoperatively and recovered uneventfully from his surgery. He began having bowel function and his diet advanced. He was given solid food 3/17 which he tolerated. On 3/18 he had no n/v, was having flatus and BMs without blood and was feeling great. He had no complaints.  Vitals:   03/11/18 1450 03/11/18 2115 03/12/18 0622 03/12/18 0819  BP: 114/70 (!) 118/94 125/86 104/83  Pulse: (!) 59 62 67 62  Resp: 16 16 16    Temp: 97.8 F (36.6 C) 97.8 F (36.6 C) 97.6 F (36.4 C)   TempSrc: Oral Oral Oral   SpO2: 97% 97% 96%   Weight:      Height:       Gen: NAD, comfortable CV: RRR Abd: soft, NT/ND; dressings taken down - incisions c/d/i without erythema, staples in place   Allergies as of 03/12/2018      Reactions   Poison Oak Extract Rash      Medication List    TAKE these medications   acetaminophen 500 MG tablet Commonly known as:  TYLENOL Take 1,000 mg by mouth every 6 (six) hours as needed for moderate pain or  headache.   levothyroxine 137 MCG tablet Commonly known as:  SYNTHROID, LEVOTHROID Take 137 mcg by mouth daily before breakfast.   losartan 100 MG tablet Commonly known as:  COZAAR Take 0.5 tablets (50 mg total) by mouth daily.   traMADol 50 MG tablet Commonly known as:  ULTRAM Take 1 tablet (50 mg total) by mouth every 6 (six) hours as needed (pain not controlled with ibuprofen/tylenol).   Vitamin D3 3000 units Tabs Take 3,000 Units by mouth daily.          Sharon Mt. Dema Severin, M.D. [facility]

## 2018-04-02 ENCOUNTER — Telehealth: Payer: Self-pay | Admitting: Nurse Practitioner

## 2018-04-02 NOTE — Telephone Encounter (Signed)
Appt has been scheduled for the pt to see Wilnette Kales on 4/11 at 2pm. Pt aware to arrive 30 minutes early. Pt is aware of our location.

## 2018-04-05 ENCOUNTER — Telehealth: Payer: Self-pay | Admitting: Nurse Practitioner

## 2018-04-05 ENCOUNTER — Encounter: Payer: Self-pay | Admitting: Nurse Practitioner

## 2018-04-05 ENCOUNTER — Inpatient Hospital Stay: Payer: Medicare HMO | Attending: Nurse Practitioner | Admitting: Nurse Practitioner

## 2018-04-05 VITALS — BP 104/73 | HR 63 | Temp 97.7°F | Resp 18 | Ht 72.0 in | Wt 238.9 lb

## 2018-04-05 DIAGNOSIS — I10 Essential (primary) hypertension: Secondary | ICD-10-CM | POA: Diagnosis not present

## 2018-04-05 DIAGNOSIS — C187 Malignant neoplasm of sigmoid colon: Secondary | ICD-10-CM | POA: Insufficient documentation

## 2018-04-05 DIAGNOSIS — R634 Abnormal weight loss: Secondary | ICD-10-CM | POA: Diagnosis not present

## 2018-04-05 DIAGNOSIS — Z86718 Personal history of other venous thrombosis and embolism: Secondary | ICD-10-CM | POA: Diagnosis not present

## 2018-04-05 DIAGNOSIS — Z79899 Other long term (current) drug therapy: Secondary | ICD-10-CM | POA: Insufficient documentation

## 2018-04-05 DIAGNOSIS — Z8673 Personal history of transient ischemic attack (TIA), and cerebral infarction without residual deficits: Secondary | ICD-10-CM | POA: Diagnosis not present

## 2018-04-05 DIAGNOSIS — Z7901 Long term (current) use of anticoagulants: Secondary | ICD-10-CM | POA: Insufficient documentation

## 2018-04-05 DIAGNOSIS — Z5111 Encounter for antineoplastic chemotherapy: Secondary | ICD-10-CM | POA: Diagnosis present

## 2018-04-05 NOTE — Patient Instructions (Signed)
Capecitabine tablets What is this medicine? CAPECITABINE (ka pe SITE a been) is a chemotherapy drug. It slows the growth of cancer cells. This medicine is used to treat breast cancer, and also colon or rectal cancer. This medicine may be used for other purposes; ask your health care provider or pharmacist if you have questions. COMMON BRAND NAME(S): Xeloda What should I tell my health care provider before I take this medicine? They need to know if you have any of these conditions: -bleeding or blood disorders -dihydropyrimidine dehydrogenase (DPD) deficiency -heart disease -infection (especially a virus infection such as chickenpox, cold sores, or herpes) -kidney disease -liver disease -an unusual or allergic reaction to capecitabine, 5-fluorouracil, other medicines, foods, dyes, or preservatives -pregnant or trying to get pregnant -breast-feeding How should I use this medicine? Take this medicine by mouth with a glass of water, within 30 minutes of the end of a meal. Do not cut, crush or chew this medicine. Follow the directions on the prescription label. Take your medicine at regular intervals. Do not take it more often than directed. Do not stop taking except on your doctor's advice. Your doctor may want you to take a combination of 150 mg and 500 mg tablets for each dose. It is very important that you know how to correctly take your dose. Taking the wrong tablets could result in an overdose (too much medication) or underdose (too little medication). Talk to your pediatrician regarding the use of this medicine in children. Special care may be needed. Overdosage: If you think you have taken too much of this medicine contact a poison control center or emergency room at once. NOTE: This medicine is only for you. Do not share this medicine with others. What if I miss a dose? If you miss a dose, do not take the missed dose at all. Do not take double or extra doses. Instead, continue with your  next scheduled dose and check with your doctor. What may interact with this medicine? -antacids with aluminum and/or magnesium -folic acid -leucovorin -medicines to increase blood counts like filgrastim, pegfilgrastim, sargramostim -phenytoin -vaccines -warfarin Talk to your doctor or health care professional before taking any of these medicines: -acetaminophen -aspirin -ibuprofen -ketoprofen -naproxen This list may not describe all possible interactions. Give your health care provider a list of all the medicines, herbs, non-prescription drugs, or dietary supplements you use. Also tell them if you smoke, drink alcohol, or use illegal drugs. Some items may interact with your medicine. What should I watch for while using this medicine? Visit your doctor for checks on your progress. This drug may make you feel generally unwell. This is not uncommon, as chemotherapy can affect healthy cells as well as cancer cells. Report any side effects. Continue your course of treatment even though you feel ill unless your doctor tells you to stop. In some cases, you may be given additional medicines to help with side effects. Follow all directions for their use. Call your doctor or health care professional for advice if you get a fever, chills or sore throat, or other symptoms of a cold or flu. Do not treat yourself. This drug decreases your body's ability to fight infections. Try to avoid being around people who are sick. This medicine may increase your risk to bruise or bleed. Call your doctor or health care professional if you notice any unusual bleeding. Be careful brushing and flossing your teeth or using a toothpick because you may get an infection or bleed more easily. If  may increase your risk to bruise or bleed. Call your doctor or health care professional if you notice any unusual bleeding.  Be careful brushing and flossing your teeth or using a toothpick because you may get an infection or bleed more easily. If you have any dental work done, tell your dentist you are receiving this medicine.  Avoid taking products that contain aspirin, acetaminophen, ibuprofen,  naproxen, or ketoprofen unless instructed by your doctor. These medicines may hide a fever.  Do not become pregnant while taking this medicine or for 6 months after stopping it. Women should inform their doctor if they wish to become pregnant or think they might be pregnant. There is a potential for serious side effects to an unborn child. Talk to your health care professional or pharmacist for more information. Do not breast-feed an infant while taking this medicine or for 2 weeks after stopping it.  Men are advised not to father a child while taking this medicine or for 3 months after stopping it.  This medicine may make it more difficult to get pregnant or father a child. Talk with your doctor or health care professional if you are concerned about your fertility.  What side effects may I notice from receiving this medicine?  Side effects that you should report to your doctor or health care professional as soon as possible:  -allergic reactions like skin rash, itching or hives, swelling of the face, lips, or tongue  -low blood counts - this medicine may decrease the number of white blood cells, red blood cells and platelets. You may be at increased risk for infections and bleeding.  -signs of infection - fever or chills, cough, sore throat, pain or difficulty passing urine  -signs of decreased platelets or bleeding - bruising, pinpoint red spots on the skin, black, tarry stools, blood in the urine  -signs of decreased red blood cells - unusually weak or tired, fainting spells, lightheadedness  -breathing problems  -changes in vision  -chest pain  -dark urine  -diarrhea of more than 4 bowel movements in one day or any diarrhea at night; bloody or watery diarrhea  -dizziness  -mouth sores  -nausea and vomiting  -pain, tingling, numbness in the hands or feet  -redness, swelling, or sores on hands or feet  -stomach pain  -vomiting  -yellow color of skin or eyes   Side effects that usually do not require medical attention (report to your doctor or health care professional if they continue or are bothersome):  -constipation  -diarrhea  -dry or itchy skin  -hair loss  -loss of appetite  -nausea  -weak or tired  This list may not describe all possible side effects. Call your doctor for medical advice about side effects. You may report side effects to FDA at 1-800-FDA-1088.  Where should I keep my medicine?  Keep out of the reach of children.  Store at room temperature between 15 and 30 degrees C (59 and 86 degrees F). Keep container tightly closed. Throw away any unused medicine after the expiration date.  NOTE: This sheet is a summary. It may not cover all possible information. If you have questions about this medicine, talk to your doctor, pharmacist, or health care provider.  © 2018 [employer] (2015-12-30 13:11:21)

## 2018-04-05 NOTE — Progress Notes (Addendum)
[place]  [phone] Fax:([phone] [place] Note   Patient Care Team: Deland Pretty, MD as PCP - General (Internal Medicine) Constance Haw, MD as PCP - Cardiology (Cardiology) 04/05/2018  CHIEF COMPLAINTS/PURPOSE OF CONSULTATION:  Sigmoid colon adenocarcinoma   REFERRED BY: Dr. Autumn Messing   Cancer Staging Colon cancer [facility]) Staging form: Colon and Rectum, AJCC 8th Edition - Pathologic stage from 03/08/2018: Stage IIIB (pT3, pN1b, cM0) - Signed by Alla Feeling, NP on 04/05/2018    Colon cancer ([place])   02/26/2018 Procedure    COLONOSCOPY per Dr. Watt Climes -An infiltrative non-obstructing small mass was found in the mid sigmoid colon. Mass was partially circumferential (involving one third of the lumen circumference). The mass measured 2 cm in length, diameter was 2 mm. No bleeding was present.   -external and internal hemorrhoids -diverticulosis in the sigmoid colon, in the descending colon, in the transverse colon, and at the hepatic flexure -one medium polyp in the mid transverse colon  -ten small polyps in the rectum, in the sigmoid colon, in the descending colon, and in the transverse colon -likely malignant tumor in the mid sigmoid colon -exam was otherwise normal       02/26/2018 Initial Biopsy    From colonoscopy: 1. LG intestine- transverse colon, descending, polyp -Tubular adenomas (8). Sessile serrated adenoma/polyp 2. LG intestine - sigmoid colon bx -INVASIVE WELL DIFFERENTIATED ADENOCARCINOMA 3. LG intestine -sigmoid colon, rectum, polyp -Tubular adenoma with high grade dysplasia. Hypoplastic polyps (3).      02/26/2018 Initial Diagnosis    Colon cancer ([place])      02/28/2018 Imaging    CT AP IMPRESSION: -No evidence of metastatic disease or other acute findings. -Colonic diverticulosis, without radiographic evidence of diverticulitis. -Mild hepatic steatosis. -Mildly enlarged prostate.       03/07/2018  Tumor Marker    CEA 1.3 (pre-op)      03/08/2018 Surgery    PROCEDURE:  Procedure(s): LAPAROSCOPIC ASSISTED SIGMOID COLECTOMY ERAS PATHWAY  MOBILIZATION OF SPLENIC FLEXURE RIGID SIGMOIDOSCOPY      03/08/2018 Cancer Staging    Staging form: Colon and Rectum, AJCC 8th Edition - Pathologic stage from 03/08/2018: Stage IIIB (pT3, pN1b, cM0) - Signed by Alla Feeling, NP on 04/05/2018      03/08/2018 Pathology Results    Diagnosis 1. Colon, segmental resection for tumor, sigmoid, stitch marks distal - INVASIVE ADENOCARCINOMA, MODERATELY DIFFERENTIATED, SPANNING 2.4 CM. - TUMOR FOCALLY INVADES THROUGH MUSCULARIS PROPRIA. - RESECTION MARGIN IS NEGATIVE. - METASTATIC CARCINOMA IN TWO OF TWENTY LYMPH NODES (2/20). - DIVERTICULOSIS. - HYPERPLASTIC POLYP (X2). - SEE ONCOLOGY TABLE.       HISTORY OF PRESENTING ILLNESS:  ARWIN BISCEGLIA 67 y.o. male is here because of newly diagnosed sigmoid colon adenocarcinoma. He reports infrequent episodes of small bright red blood in stool for several years he attributed to hemorrhoids. He was finally convinced by his PCP Dr. Shelia Media to do Cologuard testing which returned positive. Denies weight loss, decreased appetite, fatigue, change in bowel habits, or abdominal pain. He was referred to Dr. Watt Climes for colonoscopy on 02/26/2018.  This was his first colonoscopy.  External and internal hemorrhoids were found on exam as well as scattered diverticula throughout the colon. Ten polyps were found in the transverse, descending, sigmoid colon and rectum. An infiltrative non--obstructing small mass was found in the mid sigmoid colon, partially circumferential involving one third of the lumen, no bleeding was present. Pathology was positive for invasive well  differentiated adenocarcinoma. Staging work up was negative for metastatic disease in the abdomen or pelvis. Previous chest imaging from January and September 2018 showed stable and small pulmonary nodules. He was referred  to surgery and underwent laparoscopic sigmoid colectomy per Dr. Marlou Starks on 02/28/18. Pre op CEA was normal at 1.3.   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 Servando Snare. S/p stroke in 01/2016 confirmed by MRI showing 2 separate acute ischemic infarcts. LE doppler showed left LE DVT. Bubble echo showed PFO. Treated with eliquis from 01/2016 - 08/2017. He is widowed, lives alone, has 2 healthy sons. Independent of all ADLs and drives himself. Formerly very active with daily exercise prior to surgery. No family history of cancer. No significant drug or alcohol use, former cigarette smoker quit 11/16/1996 smoked 1.5 PPD x30 years.   Today he presents with his friend. He is recovering well, denies abdominal pain. Appetite is normal. BM every 1-2 days, no hematochezia. 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  MEDICAL HISTORY:  Past Medical History:  Diagnosis Date  . Arthritis   . Blood clot in vein 01/2016  . Colon cancer ([place])   . Degenerative joint disease   . History of kidney stones age 29's  . Hypertension   . Hypothyroidism   . Mass of colon    sigmoid  . Stroke ([place]) 01/2016   speech difficulty for a few hours after knee replacement left  . Thoracic ascending aortic aneurysm ([facility])    4.6-4.7 cm followed by Dr Servando Snare- LOV- 08/2017-epic   . Wears dentures   . Wears glasses     SURGICAL HISTORY: Past Surgical History:  Procedure Laterality Date  . colonscopy  02/26/2018  . KNEE ARTHROPLASTY Left 01/15/2016   Procedure: COMPUTER ASSISTED TOTAL KNEE ARTHROPLASTY;  Surgeon: [name], MD;  Location: [facility];  Service: Orthopedics;  Laterality: Left;  . LAPAROSCOPIC SIGMOID COLECTOMY N/A 03/08/2018   Procedure: LAPAROSCOPIC ASSISTED SIGMOID COLECTOMY ERAS PATHWAY;  Surgeon: Jovita Kussmaul, MD;  Location:  [facility];  Service: General;  Laterality: N/A;  . MULTIPLE TOOTH EXTRACTIONS    . TOTAL KNEE ARTHROPLASTY Right 01/13/2017   Procedure: RIGHT TOTAL KNEE ARTHROPLASTY;  Surgeon: [name], MD;  Location: [facility];  Service: Orthopedics;  Laterality: Right;    SOCIAL HISTORY: Social History   Socioeconomic History  . Marital status: Widowed    Spouse name: Not on file  . Number of children: Not on file  . Years of education: Not on file  . Highest education level: Not on file  Occupational History  . Occupation: part time job - delivers car parts  Social Needs  . Financial resource strain: Not on file  . Food insecurity:    Worry: Not on file    Inability: Not on file  . Transportation needs:    Medical: Not on file    Non-medical: Not on file  Tobacco Use  . Smoking status: Former Smoker    Packs/day: 1.50    Years: 30.00    Pack years: 45.00    Types: Cigarettes    Last attempt to quit: 11/16/1996    Years since quitting: 21.3  . Smokeless tobacco: Never Used  . Tobacco comment: quit 25 yrs ago  Substance and Sexual Activity  . Alcohol use: No  . Drug use: No  .  Sexual activity: Not on file  Lifestyle  . Physical activity:    Days per week: Not on file    Minutes per session: Not on file  . Stress: Not on file  Relationships  . Social connections:    Talks on phone: Not on file    Gets together: Not on file    Attends religious service: Not on file    Active member of club or organization: Not on file    Attends meetings of clubs or organizations: Not on file    Relationship status: Not on file  . Intimate partner violence:    Fear of current or ex partner: Not on file    Emotionally abused: Not on file    Physically abused: Not on file    Forced sexual activity: Not on file  Other Topics Concern  . Not on file  Social History Narrative  . Not on file    FAMILY HISTORY: Family History  Problem Relation Age of Onset  . Congestive Heart Failure Mother   .  Diabetes Mother   . Thyroid disease Sister     ALLERGIES:  is allergic to poison oak extract.  MEDICATIONS:  Current Outpatient Medications  Medication Sig Dispense Refill  . acetaminophen (TYLENOL) 500 MG tablet Take 1,000 mg by mouth every 6 (six) hours as needed for moderate pain or headache.     . Cholecalciferol (VITAMIN D3) 3000 units TABS Take 3,000 Units by mouth daily.    Marland Kitchen levothyroxine (SYNTHROID, LEVOTHROID) 137 MCG tablet Take 137 mcg by mouth daily before breakfast.    . losartan (COZAAR) 100 MG tablet Take 0.5 tablets (50 mg total) by mouth daily. 90 tablet 3   No current facility-administered medications for this visit.     REVIEW OF SYSTEMS:   Constitutional: Denies fevers, chills or abnormal night sweats (+) weight loss (+) normal appetite  Eyes: Denies blurriness of vision, double vision or watery eyes Ears, nose, mouth, throat, and face: Denies mucositis or sore throat Respiratory: Denies cough, dyspnea or wheezes Cardiovascular: Denies palpitation, chest discomfort or lower extremity swelling Gastrointestinal:  Denies nausea, vomiting, constipation, diarrhea, abdominal pain, heartburn or change in bowel habits (+) periodic BRBPR for years prior to surgery, (+) hemorrhoids  Skin: Denies abnormal skin rashes Lymphatics: Denies new lymphadenopathy or easy bruising Neurological:Denies numbness, tingling or new weaknesses Behavioral/Psych: Mood is stable, no new changes  All other systems were reviewed with the patient and are negative.  PHYSICAL EXAMINATION: ECOG PERFORMANCE STATUS: 0 - Asymptomatic  Vitals:   04/05/18 1347  BP: 104/73  Pulse: 63  Resp: 18  Temp: 97.7 F (36.5 C)  SpO2: 94%   Filed Weights   04/05/18 1347  Weight: 238 lb 14.4 oz (108.4 kg)    GENERAL:alert, no distress and comfortable SKIN: skin color, texture, turgor are normal, no rashes or significant lesions EYES: normal, conjunctiva are pink and non-injected, sclera  clear OROPHARYNX:no exudate, no erythema and lips, buccal mucosa, and tongue normal  LYMPH:  no palpable cervical, supraclavicular, or axillary lymphadenopathy  LUNGS: clear to auscultation with normal breathing effort HEART: regular rate & rhythm and no murmurs and no lower extremity edema ABDOMEN:abdomen soft, non-tender and normal bowel sounds. No hepatomegaly. (+) 2 right abdominal laparoscopic incisions and low abdominal midline incision are healing well; no erythema or drainage  Musculoskeletal:no cyanosis of digits and no clubbing  PSYCH: alert & oriented x 3 with fluent speech NEURO: no focal motor/sensory deficits  LABORATORY DATA:  I have reviewed the data as listed CBC Latest Ref Rng & Units 03/09/2018 03/07/2018 01/15/2017  WBC 4.0 - 10.5 K/uL 13.6(H) 6.9 8.8  Hemoglobin 13.0 - 17.0 g/dL 14.2 15.9 11.6(L)  Hematocrit 39.0 - 52.0 % 41.1 45.9 33.4(L)  Platelets 150 - 400 K/uL 202 224 142(L)   CMP Latest Ref Rng & Units 03/09/2018 03/07/2018 01/14/2017  Glucose 65 - 99 mg/dL 131(H) 106(H) 126(H)  BUN 6 - 20 mg/dL '11 12 12  ' Creatinine 0.61 - 1.24 mg/dL 1.12 1.05 1.20  Sodium 135 - 145 mmol/L 138 138 137  Potassium 3.5 - 5.1 mmol/L 4.2 4.1 4.5  Chloride 101 - 111 mmol/L 107 106 105  CO2 22 - 32 mmol/L '22 24 25  ' Calcium 8.9 - 10.3 mg/dL 8.8(L) 9.1 8.9  Total Protein 6.5 - 8.1 g/dL - 7.4 -  Total Bilirubin 0.3 - 1.2 mg/dL - 0.6 -  Alkaline Phos 38 - 126 U/L - 96 -  AST 15 - 41 U/L - 25 -  ALT 17 - 63 U/L - 19 -   CEA 02/28/18 1.3 baseline   SURGICAL PATHOLOGY  Diagnosis 03/08/18 1. Colon, segmental resection for tumor, sigmoid, stitch marks distal - INVASIVE ADENOCARCINOMA, MODERATELY DIFFERENTIATED, SPANNING 2.4 CM. - TUMOR FOCALLY INVADES THROUGH MUSCULARIS PROPRIA. - RESECTION MARGIN IS NEGATIVE. - METASTATIC CARCINOMA IN TWO OF TWENTY LYMPH NODES (2/20). - DIVERTICULOSIS. - HYPERPLASTIC POLYP (X2). - SEE ONCOLOGY TABLE. 2. Colon, resection margin (donut), sigmoid,  proximal margin - BENIGN COLONIC MUCOSA. - NO DYSPLASIA OR MALIGNANCY. 3. Colon, resection margin (donut), distal ring - BENIGN COLONIC MUCOSA. - NO DYSPLASIA OR MALIGNANCY. Microscopic Comment 1. COLON AND RECTUM (INCLUDING TRANS-ANAL RESECTION): Specimen: Sigmoid colon. Procedure: Segmental resection. Tumor site: Sigmoid. Specimen integrity: Intact. Macroscopic intactness of mesorectum: N/A. Macroscopic tumor perforation: Not identified. Invasive tumor: Maximum size: 2.4 cm Histologic type(s): Invasive adenocarcinoma. Histologic grade and differentiation: G2: moderately differentiated/low grade Type of polyp in which invasive carcinoma arose: N/A. Microscopic extension of invasive tumor: Tumor focally invades through muscularis propria. Lymph-Vascular invasion: Present. Peri-neural invasion: Not identified. Tumor deposit(s) (discontinuous extramural extension): Not identified. Resection margins: Negative. Proximal margin: Negative. Distal margin: Negative. Circumferential (radial) (posterior ascending, posterior descending; lateral and posterior mid-rectum; and entire lower 1/3 rectum): N/A. Mesenteric margin (sigmoid and transverse): Negative. Distance closest margin (if all above margins negative): >3.2 cm distal. Treatment effect (neo-adjuvant therapy): N/A. Additional polyp(s): Hyperplastic polyp (X2) Non-neoplastic findings: Diverticulosis. Lymph nodes: number examined 19; number positive: 2 Pathologic Staging: pT3, pN1b, pMX Ancillary studies: MSI and MMR will be ordered.  MMR - normal   RADIOGRAPHIC STUDIES: I have personally reviewed the radiological images as listed and agreed with the findings in the report. No results found.  ASSESSMENT & PLAN: Agustus Mane is a 67 year old white male with history of intermittent hematochezia for some time attributing to hemorrhoids, positive cologuard test and found to have non-obstructing mid sigmoid mass on colonoscopy   1.  Sigmoid colon adenocarcinoma, well differentiated, pT3N1b,M0, stage IIIB, MMR-normal -we reviewed his medical records including lab, imaging, and pathology in detail. His colon cancer was completely resected, margins were negative. Given his stage IIIB disease, he has moderately high risk of recurrence. Dr. Burr Medico discussed standard adjuvant therapy with CAPOX q3 weeks vs FOLFOX q2 weeks for 3 months to reduce his risk of recurrence; she recommends CAPOX. He is in good physical condition with well controlled co-morbidities, has good social support; he would be a good candidate for chemotherapy. Plan to  start end of next week, which would be approx 5 weeks after surgery. He has recovered well. --Chemotherapy consent: Side effects including but not not limited to fatigue, nausea, vomiting, diarrhea, mucositis, hand-foot syndrome, neuropathy, cold sensitivity, fluid retention, renal and kidney dysfunction, neutropenic fever, need for blood transfusion, and bleeding were discussed with patient in great detail. He agrees to proceed. -He prefers to use peripheral access in lieu of PAC. He will attend chemo education class.  -we discussed surveillance and f/u after he completes standard chemotherapy, including a colonoscopy in 1 year, CT scan q6-12 months, lab and f/u q3 months for the first year, q4 months for next 2 years, then annually until 5 years -he qualifies for the financial toxicity study, he is interested; I asked our research Rn to meet him at his next visit to [facility] for chemo education class. He will also meet with financial advocate. -f/u next week with cycle 1 CAPOX  2. Genetics  -given he had more than 10 polyps on colonoscopy, he qualifies for genetics referral. He has 2 children. Will discuss at subsequent f/u.   3. Weight loss -Has lost approximately 9 pounds since surgery. He would like to lose more weight. I encouraged him to avoid active weight loss while under chemotherapy; I recommend he  eat well, be active, and remain hydrated. He agrees.   4. HTN, thoracic aneurysm -BP well controlled on losartan per PCP -Stable 4.6 ascending thoracic aortic aneurysm followed by Dr. Servando Snare, last CTA 08/2017 with recommendation to repeat semi-annually   5. DVT, Stroke -he developed stroke after knee arthroplasty, found to have LE DVT on eliquis from 01/2016 - 08/2017. No recurrent edema. Followed by PCP and neuro.   PLAN: -Prescribe Xeloda, plan to begin cycle 1 04/13/18 -chemo education class, research RN, and financial advocate next week -f/u with cycle 1   All questions were answered. The patient knows to call the clinic with any problems, questions or concerns. I spent 45 minutes counseling the patient face to face. The total time spent in the appointment was 60 minutes and more than 50% was on counseling.     Alla Feeling, NP 04/05/2018   Addendum I have seen the patient, examined him. I agree with the assessment and and plan and have edited the notes.   Mr. Doubleday is a 67 year old African-American male, with past medical history of hypertension, previous history of DVT and stroke without residual neuro deficits, otherwise pretty healthy and active person, presented with rectal bleeding.  Colonoscopy showed sigmoid colon mass, biopsy confirmed adenocarcinoma.  I have reviewed his surgical pathology findings with patient, he has pT3N1bMo stage IIIB disease, baseline CEA was normal.  I will obtain a CT chest (last one in September 2018) to complete a staging.  I recommend adjuvant chemotherapy FOLFOX or Capox, he opted Capox.  Benefit and potential side effects of chemotherapy were discussed with patient in details, chemo consent obtained today.  He has recovered very well from surgery, plan to start next week. Chemo class before first cycle chemo.  We also discussed colon cancer surveillance after surgery.  He voiced good understanding and agrees with the plan.  All questions were  answered.   Truitt Merle  04/06/2018

## 2018-04-05 NOTE — Telephone Encounter (Signed)
Gave patient avs and calendar with appts per 4/11 sch msg

## 2018-04-06 ENCOUNTER — Encounter: Payer: Self-pay | Admitting: Nurse Practitioner

## 2018-04-09 ENCOUNTER — Other Ambulatory Visit: Payer: Self-pay | Admitting: Hematology

## 2018-04-09 ENCOUNTER — Ambulatory Visit (HOSPITAL_COMMUNITY)
Admission: RE | Admit: 2018-04-09 | Discharge: 2018-04-09 | Disposition: A | Discharge status: Home / Self Care | Payer: Medicare HMO | Source: Ambulatory Visit | Attending: Hematology | Admitting: Hematology

## 2018-04-09 ENCOUNTER — Encounter: Payer: Self-pay | Admitting: *Deleted

## 2018-04-09 ENCOUNTER — Inpatient Hospital Stay: Payer: Medicare HMO

## 2018-04-09 DIAGNOSIS — C772 Secondary and unspecified malignant neoplasm of intra-abdominal lymph nodes: Principal | ICD-10-CM

## 2018-04-09 DIAGNOSIS — R918 Other nonspecific abnormal finding of lung field: Secondary | ICD-10-CM | POA: Diagnosis not present

## 2018-04-09 DIAGNOSIS — I712 Thoracic aortic aneurysm, without rupture: Secondary | ICD-10-CM | POA: Insufficient documentation

## 2018-04-09 DIAGNOSIS — C187 Malignant neoplasm of sigmoid colon: Secondary | ICD-10-CM | POA: Diagnosis not present

## 2018-04-09 MED ORDER — CAPECITABINE 500 MG PO TABS: 850.0000 mg/m2 | ORAL_TABLET | Freq: Two times a day (BID) | ORAL | 1 refills | Status: DC

## 2018-04-09 MED ORDER — PROCHLORPERAZINE MALEATE 10 MG PO TABS: 10.0000 mg | ORAL_TABLET | Freq: Four times a day (QID) | ORAL | 1 refills | Status: DC | PRN

## 2018-04-09 MED ORDER — ONDANSETRON HCL 8 MG PO TABS: 8.0000 mg | ORAL_TABLET | Freq: Two times a day (BID) | ORAL | 1 refills | Status: DC | PRN

## 2018-04-09 NOTE — Progress Notes (Signed)
START ON PATHWAY REGIMEN - Colorectal     A cycle is every 21 days:     Capecitabine      Oxaliplatin   **Always confirm dose/schedule in your pharmacy ordering system**    Patient Characteristics: Colon Adjuvant, Stage III, Low Risk (T1-3, N1) Current evidence of distant metastases<= No AJCC T Category: T3 AJCC N Category: N1b AJCC M Category: M0 AJCC 8 Stage Grouping: IIIB Intent of Therapy: Curative Intent, Discussed with Patient

## 2018-04-10 ENCOUNTER — Telehealth: Payer: Self-pay | Admitting: Pharmacist

## 2018-04-10 ENCOUNTER — Telehealth: Payer: Self-pay | Admitting: Pharmacy Technician

## 2018-04-10 DIAGNOSIS — C772 Secondary and unspecified malignant neoplasm of intra-abdominal lymph nodes: Principal | ICD-10-CM

## 2018-04-10 DIAGNOSIS — C187 Malignant neoplasm of sigmoid colon: Secondary | ICD-10-CM

## 2018-04-10 MED ORDER — CAPECITABINE 500 MG PO TABS: 850.0000 mg/m2 | ORAL_TABLET | Freq: Two times a day (BID) | ORAL | 1 refills | Status: DC

## 2018-04-10 NOTE — Telephone Encounter (Signed)
Oral Chemotherapy Pharmacist Encounter   I spoke with patient for overview of: Xeloda.   Pt is doing well. Counseled patient on administration, dosing, side effects, monitoring, drug-food interactions, safe handling, storage, and disposal.  Patient will take Xeloda 500mg  tablets, 4 tablets (2000mg ) by mouth in AM and 4 tabs (2000mg ) by mouth in PM, within 30 minutes of finishing meals, on days 1-14 of each 21 day cycle.  Oxaliplatin will be infused on day 1 of each 21 day cycle.  Xeloda and oxaliplatin start date: 04/13/18  Side effects of Xeloda include but not limited to: fatigue, decreased blood counts, GI upset, diarrhea, and hand-foot syndrome. Patient has loperamide at home and will call the office if diarrhea develops.    Reviewed with patient importance of keeping a medication schedule and plan for any missed doses.  Christian Weeks voiced understanding and appreciation.   All questions answered. Medication reconciliation performed and medication/allergy list updated.  Patient informed that insurance authorization has been submitted and is pending and that the prescription has been sent to the [facility] outpatient pharmacy for dispensing.  Once insurance authorization is obtained pharmacy will be able to run test claim and then copayment will be known. Plan is to dispense from the [facility] if pharmacy is within network for patient's insurance coverage.  Oral oncology clinic will follow up with patient about copayment and dispensing pharmacy.  Patient knows to call the office with questions or concerns.  Thank you,  Christian Weeks, PharmD, BCPS, BCOP 04/10/2018   10:58 AM Oral Oncology Clinic [phone]

## 2018-04-10 NOTE — Telephone Encounter (Signed)
Oral Oncology Pharmacist Encounter  Received new prescription for Xeloda (capecitabine) for the adjuvant treatment of stage IIIb sigmoid colon cancer in conjunction with oxaliplatin, planned duration 3 months (4 cycles).  Labs from Epic assessed, okay for treatment.  Current medication list in Epic reviewed, no significant DDIs with Xeloda identified.  Prescription has been e-scribed to the [employer] for benefits analysis and approval. Insurance authorization is required, has been submitted and remains pending.  Oral Oncology Clinic will continue to follow for insurance authorization, copayment issues, initial counseling and start date.  Johny Drilling, PharmD, BCPS, BCOP 04/10/2018 10:42 AM Oral Oncology Clinic [phone]

## 2018-04-10 NOTE — Telephone Encounter (Signed)
Oral Oncology Patient Advocate Encounter  Received notification from [facility] that prior authorization for Xeloda is required.  PA submitted on CoverMyMeds Key [id number] Status is pending  Oral Oncology Clinic will continue to follow.  Fabio Asa. [name], [place] Patient [place] [phone] 04/10/2018 8:16 AM

## 2018-04-11 ENCOUNTER — Other Ambulatory Visit: Payer: Self-pay | Admitting: *Deleted

## 2018-04-11 DIAGNOSIS — I712 Thoracic aortic aneurysm, without rupture, unspecified: Secondary | ICD-10-CM

## 2018-04-11 NOTE — Progress Notes (Signed)
  Oncology Nurse Navigator Documentation  Navigator Location: [facility] (04/11/18 1255)   )Navigator Encounter Type: Introductory phone call (04/11/18 1255) Called patient to introduce myself and to provide my contact information for questions or concerns. I reviewed treatment plan and potential side effects of his chemo as well as reviewed how to use his his nausea medications. Patient encoaurged to call with questions or concerns. I plan to meet patient during chemo infusion on 04/13/18.   Abnormal Finding Date: 02/26/18 (04/11/18 1255) Confirmed Diagnosis Date: 03/08/18 (04/11/18 1255) Surgery Date: 03/08/18 (04/11/18 1255)           Treatment Initiated Date: 03/08/18 (04/11/18 1255)   Treatment Phase: Pre-Tx/Tx Discussion (04/11/18 1255) Barriers/Navigation Needs: Education (04/11/18 1255) Education: Understanding Cancer/ Treatment Options (04/11/18 1255) Interventions: Education (04/11/18 1255)     Education Method: Verbal;Teach-back (04/11/18 1255)      Acuity: Level 2 (04/11/18 1255)         Time Spent with Patient: 30 (04/11/18 1255)

## 2018-04-11 NOTE — Telephone Encounter (Signed)
Oral Oncology Patient Advocate Encounter  Prior Authorization for Xeloda has been approved.    PA# [id number] Effective dates: 04/10/2018 through 10/10/2018  Oral Oncology Clinic will continue to follow.   Fabio Asa. [name], [place] Patient [place] [phone] 04/11/2018 3:22 PM

## 2018-04-11 NOTE — Progress Notes (Signed)
Christian Weeks  Clinic Follow Up Note   Patient Care Team: Deland Pretty, MD as PCP - General (Internal Medicine) Constance Haw, MD as PCP - Cardiology (Cardiology) 04/13/2018  CHIEF COMPLAINTS:  Follow Up Sigmoid colon adenocarcinoma   REFERRED BY: Dr. 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 Watt Climes -An infiltrative non-obstructing small mass was found in the mid sigmoid colon. Mass was partially circumferential (involving one third of the lumen circumference). The mass measured 2 cm in length, diameter was 2 mm. No bleeding was present.   -external and internal hemorrhoids -diverticulosis in the sigmoid colon, in the descending colon, in the transverse colon, and at the hepatic flexure -one medium polyp in the mid transverse colon  -ten small polyps in the rectum, in the sigmoid colon, in the descending colon, and in the transverse colon -likely malignant tumor in the mid sigmoid colon -exam was otherwise normal       02/26/2018 Initial Biopsy    From colonoscopy: 1. LG intestine- transverse colon, descending, polyp -Tubular adenomas (8). Sessile serrated adenoma/polyp 2. LG intestine - sigmoid colon bx -INVASIVE WELL DIFFERENTIATED ADENOCARCINOMA 3. LG intestine -sigmoid colon, rectum, polyp -Tubular adenoma with high grade dysplasia. Hypoplastic polyps (3).      02/26/2018 Initial Diagnosis    Colon cancer ([place])      02/28/2018 Imaging    CT AP IMPRESSION: -No evidence of metastatic disease or other acute findings. -Colonic diverticulosis, without radiographic evidence of diverticulitis. -Mild hepatic steatosis. -Mildly enlarged prostate.       03/07/2018 Tumor Marker    CEA 1.3 (pre-op)      03/08/2018 Surgery    PROCEDURE:  Procedure(s): LAPAROSCOPIC ASSISTED SIGMOID COLECTOMY ERAS PATHWAY  MOBILIZATION OF SPLENIC FLEXURE RIGID SIGMOIDOSCOPY      03/08/2018 Cancer Staging    Staging form: Colon and Rectum, AJCC 8th Edition - Pathologic stage from 03/08/2018: Stage IIIB (pT3, pN1b, cM0) - Signed by Alla Feeling, NP on 04/05/2018      03/08/2018 Pathology Results    Diagnosis 1. Colon, segmental resection for tumor, sigmoid, stitch marks distal - INVASIVE ADENOCARCINOMA, MODERATELY DIFFERENTIATED, SPANNING 2.4 CM. - TUMOR FOCALLY INVADES THROUGH MUSCULARIS PROPRIA. - RESECTION MARGIN IS NEGATIVE. - METASTATIC CARCINOMA IN TWO OF TWENTY LYMPH NODES (2/20). - DIVERTICULOSIS. - HYPERPLASTIC POLYP (X2). - SEE ONCOLOGY TABLE. -MMR normal       04/09/2018 Imaging    CT Chest WO Contrast 04/09/18 IMPRESSION: 1. Stable exam. Scattered bilateral tiny pulmonary nodules without interval change. No new or progressive pulmonary nodule or mass. Attention on follow-up may be warranted. 2. Ascending thoracic aorta measures up to 4.9 cm maximum diameter, increased from 4.6 cm on prior study. Ascending thoracic aortic aneurysm. Recommend semi-annual imaging followup by CTA or MRA and referral to cardiothoracic surgery if not already obtained. This recommendation follows 2010 ACCF/AHA/AATS/ACR/ASA/SCA/SCAI/SIR/STS/SVM Guidelines for the Diagnosis and Management of Patients  With Thoracic Aortic Disease. Circulation. 2010; 121: D322-G254      04/18/2018 -  Chemotherapy    The patient had palonosetron (ALOXI) injection 0.25 mg, 0.25 mg,  Intravenous,  Once, 0 of 4 cycles oxaliplatin (ELOXATIN) 260 mg in dextrose 5 % 500 mL chemo infusion, 110 mg/m2 = 260 mg (100 % of original dose 110 mg/m2), Intravenous,  Once, 0 of 4 cycles Dose modification: 110 mg/m2 (original dose 110 mg/m2, Cycle 1, Reason: Provider Judgment, Comment: due to his age )  for chemotherapy treatment.       HISTORY OF PRESENTING ILLNESS:  Christian Weeks 67 y.o. male is here because of newly diagnosed sigmoid colon adenocarcinoma. He reports infrequent episodes of small bright red blood in stool for several years he attributed to hemorrhoids. He was finally convinced by his PCP Dr. Shelia Media to do Cologuard testing which returned positive. Denies weight loss, decreased appetite, fatigue, change in bowel habits, or abdominal pain. He was referred to Dr. Watt Climes for colonoscopy on 02/26/2018.  This was his first colonoscopy.  External and internal hemorrhoids were found on exam as well as scattered diverticula throughout the colon. Ten polyps were found in the transverse, descending, sigmoid colon and rectum. An infiltrative non--obstructing small mass was found in the mid sigmoid colon, partially circumferential involving one third of the lumen, no bleeding was present. Pathology was positive for invasive well differentiated adenocarcinoma. Staging work up was negative for metastatic disease in the abdomen or pelvis. Previous chest imaging from January and September 2018 showed stable and small pulmonary nodules. He was referred to surgery and underwent laparoscopic sigmoid colectomy per Dr. Marlou Starks on 02/28/18. Pre op CEA was normal at 1.3.   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 Servando Snare. S/p stroke in 01/2016 confirmed by MRI showing 2 separate acute ischemic infarcts. LE doppler showed left LE DVT. Bubble echo showed PFO.  Treated with eliquis from 01/2016 - 08/2017. He is widowed, lives alone, has 2 healthy sons. Independent of all ADLs and drives himself. Formerly very active with daily exercise prior to surgery. No family history of cancer. No significant drug or alcohol use, former cigarette smoker quit 11/16/1996 smoked 1.5 PPD x30 years.   Today he presents with his friend. He is recovering well, denies abdominal pain. Appetite is normal. BM every 1-2 days, no hematochezia. 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  CURRENT THERAPY: Adjuvant CAPOX every 3 weeks. 2000 mg Xeloda BID on days 1-14, every 21 days   INTERVAL HISTORY:  Christian Weeks is here for follow up and 1st cycle CAPOX. He presents tot he clinic today accompanied by his wife. He reports he is ready for his first chemo today and felt the chemo class was informative. He states he is going to pick up his Xeloda today from the pharmacy. He endorses a good appetite. He states he has recovered well from surgery.     On review of systems, pt denies any other complaints at this time. Pertinent positives are listed and detailed within the above HPI.   MEDICAL HISTORY:  Past Medical History:  Diagnosis Date  . Arthritis   . Blood clot in vein 01/2016  . Colon cancer ([place])   . Degenerative joint disease   . History of kidney stones age 66's  . Hypertension   . Hypothyroidism   .  Mass of colon    sigmoid  . Stroke ([place]) 01/2016   speech difficulty for a few hours after knee replacement left  . Thoracic ascending aortic aneurysm ([facility])    4.6-4.7 cm followed by Dr Servando Snare- LOV- 08/2017-epic   . Wears dentures   . Wears glasses     SURGICAL HISTORY: Past Surgical History:  Procedure Laterality Date  . colonscopy  02/26/2018  . KNEE ARTHROPLASTY Left 01/15/2016   Procedure: COMPUTER ASSISTED TOTAL KNEE ARTHROPLASTY;  Surgeon: [name], MD;  Location: [place];  Service: Orthopedics;  Laterality: Left;  .  LAPAROSCOPIC SIGMOID COLECTOMY N/A 03/08/2018   Procedure: LAPAROSCOPIC ASSISTED SIGMOID COLECTOMY ERAS PATHWAY;  Surgeon: Jovita Kussmaul, MD;  Location: [facility];  Service: General;  Laterality: N/A;  . MULTIPLE TOOTH EXTRACTIONS    . TOTAL KNEE ARTHROPLASTY Right 01/13/2017   Procedure: RIGHT TOTAL KNEE ARTHROPLASTY;  Surgeon: [name], MD;  Location: [place] [facility];  Service: Orthopedics;  Laterality: Right;    SOCIAL HISTORY: Social History   Socioeconomic History  . Marital status: Widowed    Spouse name: Not on file  . Number of children: Not on file  . Years of education: Not on file  . Highest education level: Not on file  Occupational History  . Occupation: part time job - delivers car parts  Social Needs  . Financial resource strain: Not on file  . Food insecurity:    Worry: Not on file    Inability: Not on file  . Transportation needs:    Medical: Not on file    Non-medical: Not on file  Tobacco Use  . Smoking status: Former Smoker    Packs/day: 1.50    Years: 30.00    Pack years: 45.00    Types: Cigarettes    Last attempt to quit: 11/16/1996    Years since quitting: 21.4  . Smokeless tobacco: Never Used  . Tobacco comment: quit 25 yrs ago  Substance and Sexual Activity  . Alcohol use: No  . Drug use: No  . Sexual activity: Not on file  Lifestyle  . Physical activity:    Days per week: Not on file    Minutes per session: Not on file  . Stress: Not on file  Relationships  . Social connections:    Talks on phone: Not on file    Gets together: Not on file    Attends religious service: Not on file    Active member of club or organization: Not on file    Attends meetings of clubs or organizations: Not on file    Relationship status: Not on file  . Intimate partner violence:    Fear of current or ex partner: Not on file    Emotionally abused: Not on file    Physically abused: Not on file    Forced sexual activity: Not on file  Other Topics Concern  . Not on file   Social History Narrative  . Not on file    FAMILY HISTORY: Family History  Problem Relation Age of Onset  . Congestive Heart Failure Mother   . Diabetes Mother   . Thyroid disease Sister     ALLERGIES:  is allergic to poison oak extract.  MEDICATIONS:  Current Outpatient Medications  Medication Sig Dispense Refill  . acetaminophen (TYLENOL) 500 MG tablet Take 1,000 mg by mouth every 6 (six) hours as needed for moderate pain or headache.     . capecitabine (XELODA) 500 MG tablet  Take 4 tablets (2,000 mg total) by mouth 2 (two) times daily after a meal. Take on days 1-14 of chemotherapy. Repeat every 21 days. 112 tablet 1  . Cholecalciferol (VITAMIN D3) 3000 units TABS Take 3,000 Units by mouth daily.    Marland Kitchen levothyroxine (SYNTHROID, LEVOTHROID) 137 MCG tablet Take 137 mcg by mouth daily before breakfast.    . losartan (COZAAR) 100 MG tablet Take 0.5 tablets (50 mg total) by mouth daily. 90 tablet 3  . ondansetron (ZOFRAN) 8 MG tablet Take 1 tablet (8 mg total) by mouth 2 (two) times daily as needed for refractory nausea / vomiting. Start on day 3 after chemotherapy. 30 tablet 1  . prochlorperazine (COMPAZINE) 10 MG tablet Take 1 tablet (10 mg total) by mouth every 6 (six) hours as needed (Nausea or vomiting). 30 tablet 1   No current facility-administered medications for this visit.     REVIEW OF SYSTEMS:   Constitutional: Denies fevers, chills or abnormal night sweats (+) weight loss (+) normal appetite  Eyes: Denies blurriness of vision, double vision or watery eyes Ears, nose, mouth, throat, and face: Denies mucositis or sore throat Respiratory: Denies cough, dyspnea or wheezes Cardiovascular: Denies palpitation, chest discomfort or lower extremity swelling Gastrointestinal:  Denies nausea, vomiting, constipation, diarrhea, abdominal pain, heartburn or change in bowel habits (+) periodic BRBPR for years prior to surgery, (+) hemorrhoids  Skin: Denies abnormal skin  rashes Lymphatics: Denies new lymphadenopathy or easy bruising Neurological:Denies numbness, tingling or new weaknesses Behavioral/Psych: Mood is stable, no new changes  All other systems were reviewed with the patient and are negative.  PHYSICAL EXAMINATION: ECOG PERFORMANCE STATUS: 0 - Asymptomatic  Vitals:   04/13/18 0918  BP: 115/79  Pulse: (!) 57  Resp: 20  Temp: 97.9 F (36.6 C)  SpO2: 96%   Filed Weights   04/13/18 0918  Weight: 238 lb 14.4 oz (108.4 kg)    GENERAL:alert, no distress and comfortable SKIN: skin color, texture, turgor are normal, no rashes or significant lesions EYES: normal, conjunctiva are pink and non-injected, sclera clear OROPHARYNX:no exudate, no erythema and lips, buccal mucosa, and tongue normal  LYMPH:  no palpable cervical, supraclavicular, or axillary lymphadenopathy  LUNGS: clear to auscultation with normal breathing effort HEART: regular rate & rhythm and no murmurs and no lower extremity edema ABDOMEN:abdomen soft, non-tender and normal bowel sounds. No hepatomegaly. (+) 2 right abdominal laparoscopic incisions and low abdominal midline incision are healing well; no erythema or drainage  Musculoskeletal:no cyanosis of digits and no clubbing  PSYCH: alert & oriented x 3 with fluent speech NEURO: no focal motor/sensory deficits  LABORATORY DATA:  I have reviewed the data as listed CBC Latest Ref Rng & Units 04/13/2018 03/09/2018 03/07/2018  WBC 4.0 - 10.3 K/uL 6.0 13.6(H) 6.9  Hemoglobin 13.0 - 17.0 g/dL - 14.2 15.9  Hematocrit 38.4 - 49.9 % 44.8 41.1 45.9  Platelets 140 - 400 K/uL 181 202 224   CMP Latest Ref Rng & Units 04/13/2018 03/09/2018 03/07/2018  Glucose 70 - 140 mg/dL 101 131(H) 106(H)  BUN 7 - 26 mg/dL '14 11 12  ' Creatinine 0.70 - 1.30 mg/dL 1.03 1.12 1.05  Sodium 136 - 145 mmol/L 137 138 138  Potassium 3.5 - 5.1 mmol/L 4.3 4.2 4.1  Chloride 98 - 109 mmol/L 109 107 106  CO2 22 - 29 mmol/L 21(L) 22 24  Calcium 8.4 - 10.4 mg/dL  9.2 8.8(L) 9.1  Total Protein 6.4 - 8.3 g/dL 7.3 - 7.4  Total  Bilirubin 0.2 - 1.2 mg/dL 0.7 - 0.6  Alkaline Phos 40 - 150 U/L 109 - 96  AST 5 - 34 U/L 20 - 25  ALT 0 - 55 U/L 16 - 19   CEA 02/28/18 1.3 baseline   PROCEDURE COLONOSCOPY per Dr. Watt Climes 02/26/18 -An infiltrative non-obstructing small mass was found in the mid sigmoid colon. Mass was partially circumferential (involving one third of the lumen circumference). The mass measured 2 cm in length, diameter was 2 mm. No bleeding was present.   -external and internal hemorrhoids -diverticulosis in the sigmoid colon, in the descending colon, in the transverse colon, and at the hepatic flexure -one medium polyp in the mid transverse colon  -ten small polyps in the rectum, in the sigmoid colon, in the descending colon, and in the transverse colon -likely malignant tumor in the mid sigmoid colon -exam was otherwise normal   SURGICAL PATHOLOGY  Diagnosis 03/08/18 1. Colon, segmental resection for tumor, sigmoid, stitch marks distal - INVASIVE ADENOCARCINOMA, MODERATELY DIFFERENTIATED, SPANNING 2.4 CM. - TUMOR FOCALLY INVADES THROUGH MUSCULARIS PROPRIA. - RESECTION MARGIN IS NEGATIVE. - METASTATIC CARCINOMA IN TWO OF TWENTY LYMPH NODES (2/20). - DIVERTICULOSIS. - HYPERPLASTIC POLYP (X2). - SEE ONCOLOGY TABLE. 2. Colon, resection margin (donut), sigmoid, proximal margin - BENIGN COLONIC MUCOSA. - NO DYSPLASIA OR MALIGNANCY. 3. Colon, resection margin (donut), distal ring - BENIGN COLONIC MUCOSA. - NO DYSPLASIA OR MALIGNANCY. Microscopic Comment 1. COLON AND RECTUM (INCLUDING TRANS-ANAL RESECTION): Specimen: Sigmoid colon. Procedure: Segmental resection. Tumor site: Sigmoid. Specimen integrity: Intact. Macroscopic intactness of mesorectum: N/A. Macroscopic tumor perforation: Not identified. Invasive tumor: Maximum size: 2.4 cm Histologic type(s): Invasive adenocarcinoma. Histologic grade and differentiation: G2: moderately  differentiated/low grade Type of polyp in which invasive carcinoma arose: N/A. Microscopic extension of invasive tumor: Tumor focally invades through muscularis propria. Lymph-Vascular invasion: Present. Peri-neural invasion: Not identified. Tumor deposit(s) (discontinuous extramural extension): Not identified. Resection margins: Negative. Proximal margin: Negative. Distal margin: Negative. Circumferential (radial) (posterior ascending, posterior descending; lateral and posterior mid-rectum; and entire lower 1/3 rectum): N/A. Mesenteric margin (sigmoid and transverse): Negative. Distance closest margin (if all above margins negative): >3.2 cm distal. Treatment effect (neo-adjuvant therapy): N/A. Additional polyp(s): Hyperplastic polyp (X2) Non-neoplastic findings: Diverticulosis. Lymph nodes: number examined 19; number positive: 2 Pathologic Staging: pT3, pN1b, pMX Ancillary studies: MSI and MMR will be ordered.  MMR - normal   RADIOGRAPHIC STUDIES: I have personally reviewed the radiological images as listed and agreed with the findings in the report.  CT Chest WO Contrast 04/09/18 IMPRESSION: 1. Stable exam. Scattered bilateral tiny pulmonary nodules without interval change. No new or progressive pulmonary nodule or mass. Attention on follow-up may be warranted. 2. Ascending thoracic aorta measures up to 4.9 cm maximum diameter, increased from 4.6 cm on prior study. Ascending thoracic aortic aneurysm. Recommend semi-annual imaging followup by CTA or MRA and referral to cardiothoracic surgery if not already obtained. This recommendation follows 2010  CT AP W Contrast 02/28/18 IMPRESSION: -No evidence of metastatic disease or other acute findings. -Colonic diverticulosis, without radiographic evidence of diverticulitis. -Mild hepatic steatosis. -Mildly enlarged prostate.  Ct Chest Wo Contrast  Result Date: 04/10/2018 CLINICAL DATA:  Colon cancer, status post resection. EXAM: CT  CHEST WITHOUT CONTRAST TECHNIQUE: Multidetector CT imaging of the chest was performed following the standard protocol without IV contrast. COMPARISON:  09/21/2017 FINDINGS: Cardiovascular: Heart size upper normal. Coronary artery calcification is evident. Ascending aorta measures 4.9 cm diameter, increased from 4.6 cm previously. Mediastinum/Nodes: No mediastinal lymphadenopathy. No  evidence for gross hilar lymphadenopathy although assessment is limited by the lack of intravenous contrast on today's study. The esophagus has normal imaging features. There is no axillary lymphadenopathy. Lungs/Pleura: Dependent mucus identified in the carina. Stable 6 mm right perifissural nodule (87/5). 3 mm posterior right upper lobe nodule (88/5) is unchanged. Mild nodularity along the infrahilar right major fissure (95/5) is stable. 5 mm nodule medial left upper lobe (7 4/5) is unchanged. 6 mm nodule seen in the lingula (105/5) is stable. Several other tiny scattered pulmonary nodules are stable. No focal airspace consolidation. No pulmonary edema or pleural effusion. Upper Abdomen: The liver shows diffusely decreased attenuation suggesting steatosis. Musculoskeletal: Bone windows reveal no worrisome lytic or sclerotic osseous lesions. Degenerative changes noted in the shoulders bilaterally. IMPRESSION: 1. Stable exam. Scattered bilateral tiny pulmonary nodules without interval change. No new or progressive pulmonary nodule or mass. Attention on follow-up may be warranted. 2. Ascending thoracic aorta measures up to 4.9 cm maximum diameter, increased from 4.6 cm on prior study. Ascending thoracic aortic aneurysm. Recommend semi-annual imaging followup by CTA or MRA and referral to cardiothoracic surgery if not already obtained. This recommendation follows 2010 ACCF/AHA/AATS/ACR/ASA/SCA/SCAI/SIR/STS/SVM Guidelines for the Diagnosis and Management of Patients With Thoracic Aortic Disease. Circulation. 2010; 121: E751-Z001  Electronically Signed   By: Misty Stanley M.D.   On: 04/10/2018 09:51    ASSESSMENT & PLAN: Christian Weeks is a 67 year old white male with history of intermittent hematochezia for some time attributing to hemorrhoids, positive cologuard test and found to have non-obstructing mid sigmoid mass on colonoscopy   1. Sigmoid colon adenocarcinoma, well differentiated, pT3N1b,M0, stage IIIB, MMR-normal -We previously reviewed his medical records including lab, imaging, and pathology in detail. His colon cancer was completely resected, margins were negative. Given his stage IIIB disease, he has moderately high risk of recurrence.  - I previously discussed standard adjuvant therapy with CAPOX q3 weeks vs FOLFOX q2 weeks for 3 months to reduce his risk of recurrence; he opted for CAPOX. He is in good physical condition with well controlled co-morbidities, has good social support; he would be a good candidate for chemotherapy. Chemo consent obtained.  -He prefers to use peripheral access in lieu of PAC. He attended chemo education class.  -we previously discussed surveillance and f/u after he completes standard chemotherapy, including a colonoscopy in 1 year, CT scan q6-12 months, lab and f/u q3 months for the first year, q4 months for next 2 years, then annually until 5 years -he qualifies for the financial toxicity study, he is interested; he met with our research Rn met him at his chemo education class. - He will also meet with financial advocate today. -CT Chest was negative for metastasis.  -I discussed his treatment plan today and clarified his dosing schedule. Potential side effects and management reviewed again. I discussed what to expect from chemo Oxaliplatin today and in the future. I discussed how to use anti-emetics. I also discussed the option of symptom management clinic if he develops adverse symptoms that he cannot control at home.  -Labs reviewed, adequate to proceed with first chemo today. I will  reduce his dose Oxaliplatin today to 110 mg/m2 due to his age.  -f/u in 3 weeks, I discussed he can f/u sooner if needed -he agreed to participate the financial toxicities study   2. Genetics  -given he had more than 10 polyps on colonoscopy, he qualifies for genetics referral. He has 2 children.  -will refer him  3. Weight loss -Has lost approximately 9 pounds since surgery. He would like to lose more weight. I previously encouraged him to avoid active weight loss while under chemotherapy; I recommend he eat well, be active, and remain hydrated. He agrees.  -weight stabilized lately   4. HTN, thoracic aneurysm -BP well controlled on losartan per PCP -Stable 4.6 ascending thoracic aortic aneurysm followed by Dr. Servando Snare, last CTA 08/2017 with recommendation to repeat semi-annually  -His BP is in the low range of normal, I discussed his BP may be low throughout chemo due to decreased po intake. I advised him to check his BP at home and if it is low and he feels dizzy, it is okay for him to hold English.   5. DVT, Stroke -he developed stroke after knee arthroplasty, found to have LE DVT. On eliquis from 01/2016 - 08/2017. No recurrent edema. Followed by PCP and neuro.   PLAN: -Labs reviewed, proceed with first cycle CAPOX with reduced dose Oxaliplatin today to 157m/m2 due to his age, and Xeloda 2000 mg BID 14 days on, 7 days off. Increase to full dose next cycle if he tolerates well  -Monitor BP at home -Lab and f/u in 3 weeks, I discussed he can f/u sooner if needed   All questions were answered. The patient knows to call the clinic with any problems, questions or concerns. I spent 20 minutes counseling the patient face to face. The total time spent in the appointment was 25 minutes and more than 50% was on counseling.  This document serves as a record of services personally performed by YTruitt Merle MD. It was created on her behalf by DTheresia Bough a trained medical scribe. The creation  of this record is based on the scribe's personal observations and the provider's statements to them.   I have reviewed the above documentation for accuracy and completeness, and I agree with the above.    YTruitt Merle MD 04/13/2018 10:44 AM

## 2018-04-12 ENCOUNTER — Other Ambulatory Visit: Payer: Self-pay | Admitting: Hematology

## 2018-04-12 ENCOUNTER — Encounter: Payer: Self-pay | Admitting: Hematology

## 2018-04-12 DIAGNOSIS — C187 Malignant neoplasm of sigmoid colon: Secondary | ICD-10-CM

## 2018-04-12 DIAGNOSIS — C772 Secondary and unspecified malignant neoplasm of intra-abdominal lymph nodes: Principal | ICD-10-CM

## 2018-04-12 DIAGNOSIS — R69 Illness, unspecified: Secondary | ICD-10-CM | POA: Diagnosis not present

## 2018-04-12 MED FILL — CAPECITABINE 500 MG TABLET: 500 | 21 days supply | Qty: 112 | Fill #0

## 2018-04-12 NOTE — Progress Notes (Signed)
Called pt to introduce myself as his Arboriculturist.  Unfortunately there aren't any foundations offering copay assistance for his Dx and the type of ins he has.  I offered the [place] and went over what it covers.  Pt would like to apply so he will bring his proof of income on 04/13/18.  If approved I will give him an expense sheet.

## 2018-04-13 ENCOUNTER — Inpatient Hospital Stay: Payer: Medicare HMO

## 2018-04-13 ENCOUNTER — Encounter: Payer: Self-pay | Admitting: Hematology

## 2018-04-13 ENCOUNTER — Encounter: Payer: Self-pay | Admitting: General Practice

## 2018-04-13 ENCOUNTER — Telehealth: Payer: Self-pay | Admitting: Hematology

## 2018-04-13 ENCOUNTER — Telehealth: Payer: Self-pay | Admitting: Genetic Counselor

## 2018-04-13 ENCOUNTER — Inpatient Hospital Stay (HOSPITAL_BASED_OUTPATIENT_CLINIC_OR_DEPARTMENT_OTHER): Payer: Medicare HMO | Admitting: Hematology

## 2018-04-13 VITALS — BP 115/79 | HR 57 | Temp 97.9°F | Resp 20 | Ht 72.0 in | Wt 238.9 lb

## 2018-04-13 DIAGNOSIS — Z5111 Encounter for antineoplastic chemotherapy: Secondary | ICD-10-CM | POA: Diagnosis not present

## 2018-04-13 DIAGNOSIS — C187 Malignant neoplasm of sigmoid colon: Secondary | ICD-10-CM

## 2018-04-13 DIAGNOSIS — Z8673 Personal history of transient ischemic attack (TIA), and cerebral infarction without residual deficits: Secondary | ICD-10-CM | POA: Diagnosis not present

## 2018-04-13 DIAGNOSIS — C772 Secondary and unspecified malignant neoplasm of intra-abdominal lymph nodes: Secondary | ICD-10-CM

## 2018-04-13 DIAGNOSIS — R634 Abnormal weight loss: Secondary | ICD-10-CM

## 2018-04-13 DIAGNOSIS — Z79899 Other long term (current) drug therapy: Secondary | ICD-10-CM | POA: Diagnosis not present

## 2018-04-13 DIAGNOSIS — Z7901 Long term (current) use of anticoagulants: Secondary | ICD-10-CM | POA: Diagnosis not present

## 2018-04-13 DIAGNOSIS — I712 Thoracic aortic aneurysm, without rupture: Secondary | ICD-10-CM | POA: Diagnosis not present

## 2018-04-13 DIAGNOSIS — I10 Essential (primary) hypertension: Secondary | ICD-10-CM | POA: Diagnosis not present

## 2018-04-13 DIAGNOSIS — Z86718 Personal history of other venous thrombosis and embolism: Secondary | ICD-10-CM

## 2018-04-13 LAB — CEA (IN HOUSE-CHCC): CEA (CHCC-In House): <1 ng/mL (ref 0.00–5.00)

## 2018-04-13 LAB — CBC WITH DIFFERENTIAL (CANCER CENTER ONLY)
BASOS ABS: 0 10*3/uL (ref 0.0–0.1)
BASOS PCT: 0 %
Eosinophils Absolute: 0.4 10*3/uL (ref 0.0–0.5)
Eosinophils Relative: 6 %
HEMATOCRIT: 44.8 % (ref 38.4–49.9)
HEMOGLOBIN: 15.3 g/dL (ref 13.0–17.1)
Lymphocytes Relative: 23 %
Lymphs Abs: 1.4 10*3/uL (ref 0.9–3.3)
MCH: 30.3 pg (ref 27.2–33.4)
MCHC: 34.2 g/dL (ref 32.0–36.0)
MCV: 88.7 fL (ref 79.3–98.0)
Monocytes Absolute: 0.8 10*3/uL (ref 0.1–0.9)
Monocytes Relative: 13 %
NEUTROS ABS: 3.5 10*3/uL (ref 1.5–6.5)
NEUTROS PCT: 58 %
Platelet Count: 181 10*3/uL (ref 140–400)
RBC: 5.05 MIL/uL (ref 4.20–5.82)
RDW: 13.4 % (ref 11.0–14.6)
WBC Count: 6 10*3/uL (ref 4.0–10.3)

## 2018-04-13 LAB — CMP (CANCER CENTER ONLY)
ALK PHOS: 109 U/L (ref 40–150)
ALT: 16 U/L (ref 0–55)
ANION GAP: 7 (ref 3–11)
AST: 20 U/L (ref 5–34)
Albumin: 3.8 g/dL (ref 3.5–5.0)
BILIRUBIN TOTAL: 0.7 mg/dL (ref 0.2–1.2)
BUN: 14 mg/dL (ref 7–26)
CALCIUM: 9.2 mg/dL (ref 8.4–10.4)
CO2: 21 mmol/L — ABNORMAL LOW (ref 22–29)
Chloride: 109 mmol/L (ref 98–109)
Creatinine: 1.03 mg/dL (ref 0.70–1.30)
GFR, Estimated: >60 mL/min (ref >60)
Glucose, Bld: 101 mg/dL (ref 70–140)
Potassium: 4.3 mmol/L (ref 3.5–5.1)
Sodium: 137 mmol/L (ref 136–145)
TOTAL PROTEIN: 7.3 g/dL (ref 6.4–8.3)

## 2018-04-13 MED ORDER — PALONOSETRON HCL INJECTION 0.25 MG/5ML
0.2500 mg | Freq: Once | INTRAVENOUS | Status: AC
  Administered 2018-04-13: 0.25 mg via INTRAVENOUS

## 2018-04-13 MED ORDER — DEXTROSE 5 % IV SOLN
Freq: Once | INTRAVENOUS | Status: AC
  Administered 2018-04-13: 11:00:00 via INTRAVENOUS

## 2018-04-13 MED ORDER — DEXAMETHASONE SODIUM PHOSPHATE 10 MG/ML IJ SOLN
10.0000 mg | Freq: Once | INTRAMUSCULAR | Status: AC
  Administered 2018-04-13: 10 mg via INTRAVENOUS

## 2018-04-13 MED ORDER — DEXAMETHASONE SODIUM PHOSPHATE 10 MG/ML IJ SOLN
INTRAMUSCULAR | Status: AC
  Filled 2018-04-13: qty 1

## 2018-04-13 MED ORDER — OXALIPLATIN CHEMO INJECTION 100 MG/20ML
106.0000 mg/m2 | Freq: Once | INTRAVENOUS | Status: AC
  Administered 2018-04-13: 250 mg via INTRAVENOUS
  Filled 2018-04-13: qty 40

## 2018-04-13 MED ORDER — PALONOSETRON HCL INJECTION 0.25 MG/5ML
INTRAVENOUS | Status: AC
  Filled 2018-04-13: qty 5

## 2018-04-13 NOTE — Telephone Encounter (Signed)
Spoke with patient regarding appointment.

## 2018-04-13 NOTE — Patient Instructions (Signed)
[facility] Discharge Instructions for Patients Receiving Chemotherapy  Today you received the following chemotherapy agents: Oxaliplatin.  To help prevent nausea and vomiting after your treatment, we encourage you to take your Compazine as directed. May take Zofran 3 days after Treatment if needed.   If you develop nausea and vomiting that is not controlled by your nausea medication, call the clinic.   BELOW ARE SYMPTOMS THAT SHOULD BE REPORTED IMMEDIATELY:  *FEVER GREATER THAN 100.5 F  *CHILLS WITH OR WITHOUT FEVER  NAUSEA AND VOMITING THAT IS NOT CONTROLLED WITH YOUR NAUSEA MEDICATION  *UNUSUAL SHORTNESS OF BREATH  *UNUSUAL BRUISING OR BLEEDING  TENDERNESS IN MOUTH AND THROAT WITH OR WITHOUT PRESENCE OF ULCERS  *URINARY PROBLEMS  *BOWEL PROBLEMS  UNUSUAL RASH Items with * indicate a potential emergency and should be followed up as soon as possible.  Feel free to call the clinic should you have any questions or concerns. The clinic phone number is [phone].  Please show the [facility] at check-in to the Emergency Department and triage nurse.  Oxaliplatin Injection What is this medicine? OXALIPLATIN (ox AL i PLA tin) is a chemotherapy drug. It targets fast dividing cells, like cancer cells, and causes these cells to die. This medicine is used to treat cancers of the colon and rectum, and many other cancers. This medicine may be used for other purposes; ask your health care provider or pharmacist if you have questions. COMMON BRAND NAME(S): Eloxatin What should I tell my health care provider before I take this medicine? They need to know if you have any of these conditions: -kidney disease -an unusual or allergic reaction to oxaliplatin, other chemotherapy, other medicines, foods, dyes, or preservatives -pregnant or trying to get pregnant -breast-feeding How should I use this medicine? This drug is given as an infusion into a vein. It is  administered in a hospital or clinic by a specially trained health care professional. Talk to your pediatrician regarding the use of this medicine in children. Special care may be needed. Overdosage: If you think you have taken too much of this medicine contact a poison control center or emergency room at once. NOTE: This medicine is only for you. Do not share this medicine with others. What if I miss a dose? It is important not to miss a dose. Call your doctor or health care professional if you are unable to keep an appointment. What may interact with this medicine? -medicines to increase blood counts like filgrastim, pegfilgrastim, sargramostim -probenecid -some antibiotics like amikacin, gentamicin, neomycin, polymyxin B, streptomycin, tobramycin -zalcitabine Talk to your doctor or health care professional before taking any of these medicines: -acetaminophen -aspirin -ibuprofen -ketoprofen -naproxen This list may not describe all possible interactions. Give your health care provider a list of all the medicines, herbs, non-prescription drugs, or dietary supplements you use. Also tell them if you smoke, drink alcohol, or use illegal drugs. Some items may interact with your medicine. What should I watch for while using this medicine? Your condition will be monitored carefully while you are receiving this medicine. You will need important blood work done while you are taking this medicine. This medicine can make you more sensitive to cold. Do not drink cold drinks or use ice. Cover exposed skin before coming in contact with cold temperatures or cold objects. When out in cold weather wear warm clothing and cover your mouth and nose to warm the air that goes into your lungs. Tell your doctor if you  get sensitive to the cold. This drug may make you feel generally unwell. This is not uncommon, as chemotherapy can affect healthy cells as well as cancer cells. Report any side effects. Continue your  course of treatment even though you feel ill unless your doctor tells you to stop. In some cases, you may be given additional medicines to help with side effects. Follow all directions for their use. Call your doctor or health care professional for advice if you get a fever, chills or sore throat, or other symptoms of a cold or flu. Do not treat yourself. This drug decreases your body's ability to fight infections. Try to avoid being around people who are sick. This medicine may increase your risk to bruise or bleed. Call your doctor or health care professional if you notice any unusual bleeding. Be careful brushing and flossing your teeth or using a toothpick because you may get an infection or bleed more easily. If you have any dental work done, tell your dentist you are receiving this medicine. Avoid taking products that contain aspirin, acetaminophen, ibuprofen, naproxen, or ketoprofen unless instructed by your doctor. These medicines may hide a fever. Do not become pregnant while taking this medicine. Women should inform their doctor if they wish to become pregnant or think they might be pregnant. There is a potential for serious side effects to an unborn child. Talk to your health care professional or pharmacist for more information. Do not breast-feed an infant while taking this medicine. Call your doctor or health care professional if you get diarrhea. Do not treat yourself. What side effects may I notice from receiving this medicine? Side effects that you should report to your doctor or health care professional as soon as possible: -allergic reactions like skin rash, itching or hives, swelling of the face, lips, or tongue -low blood counts - This drug may decrease the number of white blood cells, red blood cells and platelets. You may be at increased risk for infections and bleeding. -signs of infection - fever or chills, cough, sore throat, pain or difficulty passing urine -signs of decreased  platelets or bleeding - bruising, pinpoint red spots on the skin, black, tarry stools, nosebleeds -signs of decreased red blood cells - unusually weak or tired, fainting spells, lightheadedness -breathing problems -chest pain, pressure -cough -diarrhea -jaw tightness -mouth sores -nausea and vomiting -pain, swelling, redness or irritation at the injection site -pain, tingling, numbness in the hands or feet -problems with balance, talking, walking -redness, blistering, peeling or loosening of the skin, including inside the mouth -trouble passing urine or change in the amount of urine Side effects that usually do not require medical attention (report to your doctor or health care professional if they continue or are bothersome): -changes in vision -constipation -hair loss -loss of appetite -metallic taste in the mouth or changes in taste -stomach pain This list may not describe all possible side effects. Call your doctor for medical advice about side effects. You may report side effects to FDA at 1-800-FDA-1088. Where should I keep my medicine? This drug is given in a hospital or clinic and will not be stored at home. NOTE: This sheet is a summary. It may not cover all possible information. If you have questions about this medicine, talk to your doctor, pharmacist, or health care provider.  2018 [employer] (2008-07-08 17:22:47)

## 2018-04-13 NOTE — Telephone Encounter (Signed)
No los 4/19 

## 2018-04-13 NOTE — Progress Notes (Signed)
Met with patient for [employer] whom brought proof of income for the one-time $400 [place].  Patient approved for the grant. He was given a copy of the approval letter as well as the expense sheet along with the Outpatient pharmacy information. Called Raquel Sarna in oral chemo to confirm patient's copay and that he is to pick up from outpatient pharmacy and use the grant for his copay. She confirmed this is correct and she will call the pharmacy to update them on the grant approval. Gave him Lenise's card for any other financial questions or concerns.

## 2018-04-13 NOTE — Progress Notes (Signed)
[place] CSW Progress Note  Referral received from clinical trials nurse, patient states he was referred "somewhere" for financial help.  Reviewed chart - has not been to [employer] however lives very close to [employer].  Called patient and gave him this information, patient appreciative.  Will mail packet of information on [facility] and services.  Edwyna Shell, LCSW Clinical Social Worker Phone:  [phone]

## 2018-04-13 NOTE — Progress Notes (Signed)
  Oncology Nurse Navigator Documentation  Navigator Location: [facility] (04/13/18 1241)   )Navigator Encounter Type: Treatment (04/13/18 1241)                   Treatment Initiated Date: 04/13/18 (04/13/18 1241)   Treatment Phase: First Chemo Tx (04/13/18 1241)   Education: Pain/ Symptom Management (04/13/18 1241) Met with patient and wife during first infusion. Symptom management for neuropathy reviewed with patient. I encouraged patient to call with questions or concerns.       Education Method: Verbal;Teach-back (04/13/18 1241)      Acuity: Level 2 (04/13/18 1241)         Time Spent with Patient: 15 (04/13/18 1241)

## 2018-04-17 ENCOUNTER — Telehealth: Payer: Self-pay | Admitting: Emergency Medicine

## 2018-04-17 NOTE — Telephone Encounter (Signed)
TCT patient, left voicemail asking patient to call back to see how he's doing and if he has any questions.

## 2018-04-25 ENCOUNTER — Encounter: Payer: Self-pay | Admitting: *Deleted

## 2018-04-30 DIAGNOSIS — Z87891 Personal history of nicotine dependence: Secondary | ICD-10-CM | POA: Diagnosis not present

## 2018-04-30 DIAGNOSIS — R51 Headache: Secondary | ICD-10-CM | POA: Diagnosis not present

## 2018-04-30 DIAGNOSIS — C189 Malignant neoplasm of colon, unspecified: Secondary | ICD-10-CM | POA: Diagnosis not present

## 2018-04-30 DIAGNOSIS — Z79899 Other long term (current) drug therapy: Secondary | ICD-10-CM | POA: Diagnosis not present

## 2018-04-30 DIAGNOSIS — I10 Essential (primary) hypertension: Secondary | ICD-10-CM | POA: Diagnosis not present

## 2018-04-30 DIAGNOSIS — Z8249 Family history of ischemic heart disease and other diseases of the circulatory system: Secondary | ICD-10-CM | POA: Diagnosis not present

## 2018-04-30 DIAGNOSIS — K08109 Complete loss of teeth, unspecified cause, unspecified class: Secondary | ICD-10-CM | POA: Diagnosis not present

## 2018-04-30 DIAGNOSIS — E039 Hypothyroidism, unspecified: Secondary | ICD-10-CM | POA: Diagnosis not present

## 2018-04-30 DIAGNOSIS — Z8673 Personal history of transient ischemic attack (TIA), and cerebral infarction without residual deficits: Secondary | ICD-10-CM | POA: Diagnosis not present

## 2018-04-30 DIAGNOSIS — N529 Male erectile dysfunction, unspecified: Secondary | ICD-10-CM | POA: Diagnosis not present

## 2018-05-01 NOTE — Progress Notes (Signed)
[place]  Telephone:([phone]) [phone] Fax:([phone]) [phone]  Clinic Follow Up Note   Patient Care Team: Deland Pretty, MD as PCP - General (Internal Medicine) Constance Haw, MD as PCP - Cardiology (Cardiology) 05/04/2018  CHIEF COMPLAINTS:  Follow Up Sigmoid colon adenocarcinoma   REFERRED BY: Dr. 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 Watt Climes -An infiltrative non-obstructing small mass was found in the mid sigmoid colon. Mass was partially circumferential (involving one third of the lumen circumference). The mass measured 2 cm in length, diameter was 2 mm. No bleeding was present.   -external and internal hemorrhoids -diverticulosis in the sigmoid colon, in the descending colon, in the transverse colon, and at the hepatic flexure -one medium polyp in the mid transverse colon  -ten small polyps in the rectum, in the sigmoid colon, in the descending colon, and in the transverse colon -likely malignant tumor in the mid sigmoid colon -exam was otherwise normal       02/26/2018 Initial Biopsy    From colonoscopy: 1. LG intestine- transverse colon, descending, polyp -Tubular adenomas (8). Sessile serrated adenoma/polyp 2. LG intestine - sigmoid colon bx -INVASIVE WELL DIFFERENTIATED ADENOCARCINOMA 3. LG intestine -sigmoid colon, rectum, polyp -Tubular adenoma with high grade dysplasia. Hypoplastic polyps (3).      02/26/2018 Initial Diagnosis    Colon cancer ([place])      02/28/2018 Imaging    CT AP IMPRESSION: -No evidence of metastatic disease or other acute findings. -Colonic diverticulosis, without radiographic evidence of diverticulitis. -Mild hepatic steatosis. -Mildly enlarged prostate.       03/07/2018 Tumor Marker    CEA 1.3 (pre-op)      03/08/2018 Surgery    PROCEDURE:  Procedure(s): LAPAROSCOPIC ASSISTED SIGMOID COLECTOMY ERAS PATHWAY  MOBILIZATION OF SPLENIC FLEXURE RIGID SIGMOIDOSCOPY      03/08/2018 Cancer Staging    Staging form: Colon and Rectum, AJCC 8th Edition - Pathologic stage from 03/08/2018: Stage IIIB (pT3, pN1b, cM0) - Signed by Alla Feeling, NP on 04/05/2018      03/08/2018 Pathology Results    Diagnosis 1. Colon, segmental resection for tumor, sigmoid, stitch marks distal - INVASIVE ADENOCARCINOMA, MODERATELY DIFFERENTIATED, SPANNING 2.4 CM. - TUMOR FOCALLY INVADES THROUGH MUSCULARIS PROPRIA. - RESECTION MARGIN IS NEGATIVE. - METASTATIC CARCINOMA IN TWO OF TWENTY LYMPH NODES (2/20). - DIVERTICULOSIS. - HYPERPLASTIC POLYP (X2). - SEE ONCOLOGY TABLE. -MMR normal       04/09/2018 Imaging    CT Chest WO Contrast 04/09/18 IMPRESSION: 1. Stable exam. Scattered bilateral tiny pulmonary nodules without interval change. No new or progressive pulmonary nodule or mass. Attention on follow-up may be warranted. 2. Ascending thoracic aorta measures up to 4.9 cm maximum diameter, increased from 4.6 cm on prior study. Ascending thoracic aortic aneurysm. Recommend semi-annual imaging followup by CTA or MRA and referral to cardiothoracic surgery if not already obtained. This recommendation follows 2010 ACCF/AHA/AATS/ACR/ASA/SCA/SCAI/SIR/STS/SVM Guidelines for the Diagnosis and Management of Patients  With Thoracic Aortic Disease. Circulation. 2010; 121: Z610-R604      04/13/2018 -  Chemotherapy    The patient had palonosetron (ALOXI) injection 0.25 mg, 0.25 mg,  Intravenous,  Once, 1 of 4 cycles Administration: 0.25 mg (04/13/2018) oxaliplatin (ELOXATIN) 250 mg in dextrose 5 % 500 mL chemo infusion, 106 mg/m2 = 260 mg (100 % of original dose 110 mg/m2), Intravenous,  Once, 1 of 4 cycles Dose modification: 110 mg/m2 (original dose 110 mg/m2, Cycle 1, Reason: Provider Judgment, Comment: due to his age ), 130 mg/m2 (original dose 110 mg/m2, Cycle 2, Reason: Provider Judgment) Administration: 250 mg (04/13/2018)  for chemotherapy treatment.       HISTORY OF PRESENTING ILLNESS:  Christian Weeks 67 y.o. male is here because of newly diagnosed sigmoid colon adenocarcinoma. He reports infrequent episodes of small bright red blood in stool for several years he attributed to hemorrhoids. He was finally convinced by his PCP Dr. Shelia Media to do Cologuard testing which returned positive. Denies weight loss, decreased appetite, fatigue, change in bowel habits, or abdominal pain. He was referred to Dr. Watt Climes for colonoscopy on 02/26/2018.  This was his first colonoscopy.  External and internal hemorrhoids were found on exam as well as scattered diverticula throughout the colon. Ten polyps were found in the transverse, descending, sigmoid colon and rectum. An infiltrative non--obstructing small mass was found in the mid sigmoid colon, partially circumferential involving one third of the lumen, no bleeding was present. Pathology was positive for invasive well differentiated adenocarcinoma. Staging work up was negative for metastatic disease in the abdomen or pelvis. Previous chest imaging from January and September 2018 showed stable and small pulmonary nodules. He was referred to surgery and underwent laparoscopic sigmoid colectomy per Dr. Marlou Starks on 02/28/18. Pre op CEA was normal at 1.3.   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  Servando Snare. S/p stroke in 01/2016 confirmed by MRI showing 2 separate acute ischemic infarcts. LE doppler showed left LE DVT. Bubble echo showed PFO. Treated with eliquis from 01/2016 - 08/2017. He is widowed, lives alone, has 2 healthy sons. Independent of all ADLs and drives himself. Formerly very active with daily exercise prior to surgery. No family history of cancer. No significant drug or alcohol use, former cigarette smoker quit 11/16/1996 smoked 1.5 PPD x30 years.   Today he presents with his friend. He is recovering well, denies abdominal pain. Appetite is normal. BM every 1-2 days, no hematochezia. 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  CURRENT THERAPY: Adjuvant CAPOX every 3 weeks. 2000 mg Xeloda BID on days 1-14, every 21 days   INTERVAL HISTORY:  ELHADJI PECORE is here for follow up after completion of his 1st cycle CAPOX. He is accompanied by his wife today. He notes that his first chemo cycle went okay, he had some nausea (this occurred 2 days after), coldness to his fingers, and lack of taste. He hasn't taken the anti-emetic medication as of yet. He notes that the Xeloda took 14 days prior to him feeling sick. He denies any bowel issues on this medications.   On review of systems, he denies any other symptoms. Pertinent positives are listed and detailed within the above HPI.   MEDICAL HISTORY:  Past Medical History:  Diagnosis Date  . Arthritis   . Blood clot in vein 01/2016  .  Colon cancer ([place])   . Degenerative joint disease   . History of kidney stones age 79's  . Hypertension   . Hypothyroidism   . Mass of colon    sigmoid  . Stroke ([place]) 01/2016   speech difficulty for a few hours after knee replacement left  . Thoracic ascending aortic aneurysm ([facility])    4.6-4.7 cm followed by Dr Servando Snare- LOV- 08/2017-epic   . Wears dentures   . Wears glasses     SURGICAL HISTORY: Past Surgical History:  Procedure Laterality Date  . colonscopy   02/26/2018  . KNEE ARTHROPLASTY Left 01/15/2016   Procedure: COMPUTER ASSISTED TOTAL KNEE ARTHROPLASTY;  Surgeon: [name], MD;  Location: [facility];  Service: Orthopedics;  Laterality: Left;  . LAPAROSCOPIC SIGMOID COLECTOMY N/A 03/08/2018   Procedure: LAPAROSCOPIC ASSISTED SIGMOID COLECTOMY ERAS PATHWAY;  Surgeon: Jovita Kussmaul, MD;  Location: [facility];  Service: General;  Laterality: N/A;  . MULTIPLE TOOTH EXTRACTIONS    . TOTAL KNEE ARTHROPLASTY Right 01/13/2017   Procedure: RIGHT TOTAL KNEE ARTHROPLASTY;  Surgeon: [name], MD;  Location: [facility];  Service: Orthopedics;  Laterality: Right;    SOCIAL HISTORY: Social History   Socioeconomic History  . Marital status: Widowed    Spouse name: Not on file  . Number of children: Not on file  . Years of education: Not on file  . Highest education level: Not on file  Occupational History  . Occupation: part time job - delivers car parts  Social Needs  . Financial resource strain: Not on file  . Food insecurity:    Worry: Not on file    Inability: Not on file  . Transportation needs:    Medical: Not on file    Non-medical: Not on file  Tobacco Use  . Smoking status: Former Smoker    Packs/day: 1.50    Years: 30.00    Pack years: 45.00    Types: Cigarettes    Last attempt to quit: 11/16/1996    Years since quitting: 21.4  . Smokeless tobacco: Never Used  . Tobacco comment: quit 25 yrs ago  Substance and Sexual Activity  . Alcohol use: No  . Drug use: No  . Sexual activity: Not on file  Lifestyle  . Physical activity:    Days per week: Not on file    Minutes per session: Not on file  . Stress: Not on file  Relationships  . Social connections:    Talks on phone: Not on file    Gets together: Not on file    Attends religious service: Not on file    Active member of club or organization: Not on file    Attends meetings of clubs or organizations: Not on file    Relationship status: Not on file  . Intimate partner  violence:    Fear of current or ex partner: Not on file    Emotionally abused: Not on file    Physically abused: Not on file    Forced sexual activity: Not on file  Other Topics Concern  . Not on file  Social History Narrative  . Not on file    FAMILY HISTORY: Family History  Problem Relation Age of Onset  . Congestive Heart Failure Mother   . Diabetes Mother   . Thyroid disease Sister     ALLERGIES:  is allergic to poison oak extract.  MEDICATIONS:  Current Outpatient Medications  Medication Sig Dispense Refill  . acetaminophen (TYLENOL) 500  MG tablet Take 1,000 mg by mouth every 6 (six) hours as needed for moderate pain or headache.     . capecitabine (XELODA) 500 MG tablet Take 4 tablets (2,000 mg total) by mouth 2 (two) times daily after a meal. Take on days 1-14 of chemotherapy. Repeat every 21 days. 112 tablet 1  . Cholecalciferol (VITAMIN D3) 3000 units TABS Take 3,000 Units by mouth daily.    Marland Kitchen levothyroxine (SYNTHROID, LEVOTHROID) 137 MCG tablet Take 137 mcg by mouth daily before breakfast.    . losartan (COZAAR) 100 MG tablet Take 0.5 tablets (50 mg total) by mouth daily. 90 tablet 3  . ondansetron (ZOFRAN) 8 MG tablet Take 1 tablet (8 mg total) by mouth 2 (two) times daily as needed for refractory nausea / vomiting. Start on day 3 after chemotherapy. (Patient not taking: Reported on 05/04/2018) 30 tablet 1  . prochlorperazine (COMPAZINE) 10 MG tablet Take 1 tablet (10 mg total) by mouth every 6 (six) hours as needed (Nausea or vomiting). (Patient not taking: Reported on 05/04/2018) 30 tablet 1   No current facility-administered medications for this visit.     REVIEW OF SYSTEMS:   Constitutional: Denies fevers, chills or abnormal night sweats (+) weight loss (+) normal appetite  Eyes: Denies blurriness of vision, double vision or watery eyes Ears, nose, mouth, throat, and face: Denies mucositis or sore throat Respiratory: Denies cough, dyspnea or  wheezes Cardiovascular: Denies palpitation, chest discomfort or lower extremity swelling Gastrointestinal:  Denies nausea, vomiting, constipation, diarrhea, abdominal pain, heartburn or change in bowel habits (+) periodic BRBPR for years prior to surgery, (+) hemorrhoids  Skin: Denies abnormal skin rashes Lymphatics: Denies new lymphadenopathy or easy bruising Neurological:Denies numbness, tingling or new weaknesses Behavioral/Psych: Mood is stable, no new changes  All other systems were reviewed with the patient and are negative.  PHYSICAL EXAMINATION:  ECOG PERFORMANCE STATUS: 0 - Asymptomatic  Vitals:   05/04/18 0956  BP: 121/83  Pulse: (!) 57  Resp: 18  Temp: (!) 97.5 F (36.4 C)  SpO2: 99%   Filed Weights   05/04/18 0956  Weight: 241 lb 14.4 oz (109.7 kg)    GENERAL:alert, no distress and comfortable SKIN: skin color, texture, turgor are normal, no rashes or significant lesions EYES: normal, conjunctiva are pink and non-injected, sclera clear OROPHARYNX:no exudate, no erythema and lips, buccal mucosa, and tongue normal  LYMPH:  no palpable cervical, supraclavicular, or axillary lymphadenopathy  LUNGS: clear to auscultation with normal breathing effort HEART: regular rate & rhythm and no murmurs and no lower extremity edema ABDOMEN:abdomen soft, non-tender and normal bowel sounds. No hepatomegaly. (+) 2 right abdominal laparoscopic incisions and low abdominal midline incision are healing well; no erythema or drainage  Musculoskeletal:no cyanosis of digits and no clubbing  PSYCH: alert & oriented x 3 with fluent speech NEURO: no focal motor/sensory deficits  LABORATORY DATA:  I have reviewed the data as listed CBC Latest Ref Rng & Units 05/04/2018 04/13/2018 03/09/2018  WBC 4.0 - 10.3 K/uL 5.6 6.0 13.6(H)  Hemoglobin 13.0 - 17.1 g/dL 14.3 15.3 14.2  Hematocrit 38.4 - 49.9 % 42.0 44.8 41.1  Platelets 140 - 400 K/uL 205 181 202   CMP Latest Ref Rng & Units 05/04/2018  04/13/2018 03/09/2018  Glucose 70 - 140 mg/dL 98 101 131(H)  BUN 7 - 26 mg/dL '12 14 11  ' Creatinine 0.70 - 1.30 mg/dL 1.05 1.03 1.12  Sodium 136 - 145 mmol/L 137 137 138  Potassium 3.5 - 5.1 mmol/L  4.0 4.3 4.2  Chloride 98 - 109 mmol/L 110(H) 109 107  CO2 22 - 29 mmol/L 21(L) 21(L) 22  Calcium 8.4 - 10.4 mg/dL 8.9 9.2 8.8(L)  Total Protein 6.4 - 8.3 g/dL 6.9 7.3 -  Total Bilirubin 0.2 - 1.2 mg/dL 0.6 0.7 -  Alkaline Phos 40 - 150 U/L 110 109 -  AST 5 - 34 U/L 26 20 -  ALT 0 - 55 U/L 24 16 -   CEA 02/28/18 1.3 baseline   PROCEDURE COLONOSCOPY per Dr. Watt Climes 02/26/18 -An infiltrative non-obstructing small mass was found in the mid sigmoid colon. Mass was partially circumferential (involving one third of the lumen circumference). The mass measured 2 cm in length, diameter was 2 mm. No bleeding was present.   -external and internal hemorrhoids -diverticulosis in the sigmoid colon, in the descending colon, in the transverse colon, and at the hepatic flexure -one medium polyp in the mid transverse colon  -ten small polyps in the rectum, in the sigmoid colon, in the descending colon, and in the transverse colon -likely malignant tumor in the mid sigmoid colon -exam was otherwise normal   SURGICAL PATHOLOGY  Diagnosis 03/08/18 1. Colon, segmental resection for tumor, sigmoid, stitch marks distal - INVASIVE ADENOCARCINOMA, MODERATELY DIFFERENTIATED, SPANNING 2.4 CM. - TUMOR FOCALLY INVADES THROUGH MUSCULARIS PROPRIA. - RESECTION MARGIN IS NEGATIVE. - METASTATIC CARCINOMA IN TWO OF TWENTY LYMPH NODES (2/20). - DIVERTICULOSIS. - HYPERPLASTIC POLYP (X2). - SEE ONCOLOGY TABLE. 2. Colon, resection margin (donut), sigmoid, proximal margin - BENIGN COLONIC MUCOSA. - NO DYSPLASIA OR MALIGNANCY. 3. Colon, resection margin (donut), distal ring - BENIGN COLONIC MUCOSA. - NO DYSPLASIA OR MALIGNANCY. Microscopic Comment 1. COLON AND RECTUM (INCLUDING TRANS-ANAL RESECTION): Specimen: Sigmoid  colon. Procedure: Segmental resection. Tumor site: Sigmoid. Specimen integrity: Intact. Macroscopic intactness of mesorectum: N/A. Macroscopic tumor perforation: Not identified. Invasive tumor: Maximum size: 2.4 cm Histologic type(s): Invasive adenocarcinoma. Histologic grade and differentiation: G2: moderately differentiated/low grade Type of polyp in which invasive carcinoma arose: N/A. Microscopic extension of invasive tumor: Tumor focally invades through muscularis propria. Lymph-Vascular invasion: Present. Peri-neural invasion: Not identified. Tumor deposit(s) (discontinuous extramural extension): Not identified. Resection margins: Negative. Proximal margin: Negative. Distal margin: Negative. Circumferential (radial) (posterior ascending, posterior descending; lateral and posterior mid-rectum; and entire lower 1/3 rectum): N/A. Mesenteric margin (sigmoid and transverse): Negative. Distance closest margin (if all above margins negative): >3.2 cm distal. Treatment effect (neo-adjuvant therapy): N/A. Additional polyp(s): Hyperplastic polyp (X2) Non-neoplastic findings: Diverticulosis. Lymph nodes: number examined 19; number positive: 2 Pathologic Staging: pT3, pN1b, pMX Ancillary studies: MSI and MMR will be ordered.  MMR - normal   RADIOGRAPHIC STUDIES: I have personally reviewed the radiological images as listed and agreed with the findings in the report.  CT Chest WO Contrast 04/09/18 IMPRESSION: 1. Stable exam. Scattered bilateral tiny pulmonary nodules without interval change. No new or progressive pulmonary nodule or mass. Attention on follow-up may be warranted. 2. Ascending thoracic aorta measures up to 4.9 cm maximum diameter, increased from 4.6 cm on prior study. Ascending thoracic aortic aneurysm. Recommend semi-annual imaging followup by CTA or MRA and referral to cardiothoracic surgery if not already obtained. This recommendation follows 2010  CT AP W Contrast  02/28/18 IMPRESSION: -No evidence of metastatic disease or other acute findings. -Colonic diverticulosis, without radiographic evidence of diverticulitis. -Mild hepatic steatosis. -Mildly enlarged prostate.  Ct Chest Wo Contrast  Result Date: 04/10/2018 CLINICAL DATA:  Colon cancer, status post resection. EXAM: CT CHEST WITHOUT CONTRAST TECHNIQUE: Multidetector CT imaging  of the chest was performed following the standard protocol without IV contrast. COMPARISON:  09/21/2017 FINDINGS: Cardiovascular: Heart size upper normal. Coronary artery calcification is evident. Ascending aorta measures 4.9 cm diameter, increased from 4.6 cm previously. Mediastinum/Nodes: No mediastinal lymphadenopathy. No evidence for gross hilar lymphadenopathy although assessment is limited by the lack of intravenous contrast on today's study. The esophagus has normal imaging features. There is no axillary lymphadenopathy. Lungs/Pleura: Dependent mucus identified in the carina. Stable 6 mm right perifissural nodule (87/5). 3 mm posterior right upper lobe nodule (88/5) is unchanged. Mild nodularity along the infrahilar right major fissure (95/5) is stable. 5 mm nodule medial left upper lobe (7 4/5) is unchanged. 6 mm nodule seen in the lingula (105/5) is stable. Several other tiny scattered pulmonary nodules are stable. No focal airspace consolidation. No pulmonary edema or pleural effusion. Upper Abdomen: The liver shows diffusely decreased attenuation suggesting steatosis. Musculoskeletal: Bone windows reveal no worrisome lytic or sclerotic osseous lesions. Degenerative changes noted in the shoulders bilaterally. IMPRESSION: 1. Stable exam. Scattered bilateral tiny pulmonary nodules without interval change. No new or progressive pulmonary nodule or mass. Attention on follow-up may be warranted. 2. Ascending thoracic aorta measures up to 4.9 cm maximum diameter, increased from 4.6 cm on prior study. Ascending thoracic aortic aneurysm.  Recommend semi-annual imaging followup by CTA or MRA and referral to cardiothoracic surgery if not already obtained. This recommendation follows 2010 ACCF/AHA/AATS/ACR/ASA/SCA/SCAI/SIR/STS/SVM Guidelines for the Diagnosis and Management of Patients With Thoracic Aortic Disease. Circulation. 2010; 121: Z610-R604 Electronically Signed   By: Misty Stanley M.D.   On: 04/10/2018 09:51    ASSESSMENT & PLAN: Christian Weeks is a 67 y.o. white male with history of intermittent hematochezia for some time attributing to hemorrhoids, positive cologuard test and found to have non-obstructing mid sigmoid mass on colonoscopy   1. Sigmoid colon adenocarcinoma, well differentiated, pT3N1b,M0, stage IIIB, MMR-normal -We previously reviewed his medical records including lab, imaging, and pathology in detail. His colon cancer was completely resected, margins were negative. Given his stage IIIB disease, he has moderately high risk of recurrence.  - I previously discussed standard adjuvant therapy with CAPOX q3 weeks vs FOLFOX q2 weeks for 3 months to reduce his risk of recurrence; he opted for CAPOX. He is in good physical condition with well controlled co-morbidities, has good social support; he would be a good candidate for chemotherapy. Chemo consent obtained.  -He has started adjuvant Capeox on 4/19, tolerated first cycle very well.  Unfortunately due to his misunderstanding, he only took 50% of the Xeloda dose as prescribed.  I also slightly decreased his oxaliplatin dose for first cycle. -I discussed his treatment plan today and clarified his dosing schedule. Potential side effects and management reviewed again. I discussed what to expect from chemo Oxaliplatin today and in the future. I discussed how to use anti-emetics. I also discussed the option of symptom management clinic if he develops adverse symptoms that he cannot control at home.  -Labs reviewed, adequate to proceed with first chemo today. I will reduce his dose  Oxaliplatin today to 110 mg/m2 due to his age.  -Labs reviewed today (05/04/2018), adequate to proceed with second cycle today. I will give the patient the full dose of oxaliplatin today.  -Lab, f/u and chemo oxaliplatin in 3 and 6 weeks  -Lab and f/u with lacie in one week  -Patient participated in financial toxicity clinical trial, he is doing well on the trial.    2. Genetics  -given he  had more than 10 polyps on colonoscopy, he qualifies for genetics referral. He has 2 children.  -will refer him   3. Weight loss -Has lost approximately 9 pounds since surgery. He would like to lose more weight. I previously encouraged him to avoid active weight loss while under chemotherapy; I previously recommended that he eat well, be active, and remain hydrated. He agrees.  -weight stabilized lately  -Recent weight gain to 241 lbs today, 05/04/2018 increased from 238 lbs on 04/13/2018.   4. HTN, thoracic aneurysm -BP well controlled on losartan per PCP -Stable 4.6 ascending thoracic aortic aneurysm followed by Dr. Servando Snare, last CTA 08/2017 with recommendation to repeat semi-annually  -His BP is in the low range of normal, I discussed his BP may be low throughout chemo due to decreased po intake. I advised him to check his BP at home and if it is low and he feels dizzy, it is okay for him to hold Camilla.   5. DVT, Stroke -he developed stroke after knee arthroplasty, found to have LE DVT. On eliquis from 01/2016 - 08/2017. No recurrent edema. Followed by PCP and neuro.   PLAN: -Labs reviewed, proceed with second cycle CAPOX with full dose Oxaliplatin today, and Xeloda 2000 mg BID 14 days on, 7 days off.  -Lab, f/u and chemo oxaliplatin in 3 and 6 weeks  -Lab and f/u with lacie in one week for toxicity checkup.    All questions were answered. The patient knows to call the clinic with any problems, questions or concerns. I spent 20 minutes counseling the patient face to face. The total time spent in  the appointment was 25 minutes and more than 50% was on counseling.  This document serves as a record of services personally performed by Truitt Merle, MD. It was created on her behalf by Steva Colder, a trained medical scribe. The creation of this record is based on the scribe's personal observations and the provider's statements to them.   I have reviewed the above documentation for accuracy and completeness, and I agree with the above.     Soijett A Blue 05/04/2018 10:49 AM

## 2018-05-04 ENCOUNTER — Encounter: Payer: Self-pay | Admitting: Hematology

## 2018-05-04 ENCOUNTER — Telehealth: Payer: Self-pay

## 2018-05-04 ENCOUNTER — Inpatient Hospital Stay: Payer: Medicare HMO

## 2018-05-04 ENCOUNTER — Inpatient Hospital Stay: Payer: Medicare HMO | Attending: Nurse Practitioner | Admitting: Hematology

## 2018-05-04 VITALS — BP 121/83 | HR 57 | Temp 97.5°F | Resp 18 | Ht 72.0 in | Wt 241.9 lb

## 2018-05-04 DIAGNOSIS — I10 Essential (primary) hypertension: Secondary | ICD-10-CM | POA: Insufficient documentation

## 2018-05-04 DIAGNOSIS — Z5111 Encounter for antineoplastic chemotherapy: Secondary | ICD-10-CM | POA: Insufficient documentation

## 2018-05-04 DIAGNOSIS — I82409 Acute embolism and thrombosis of unspecified deep veins of unspecified lower extremity: Secondary | ICD-10-CM | POA: Diagnosis not present

## 2018-05-04 DIAGNOSIS — I712 Thoracic aortic aneurysm, without rupture: Secondary | ICD-10-CM | POA: Diagnosis not present

## 2018-05-04 DIAGNOSIS — C187 Malignant neoplasm of sigmoid colon: Secondary | ICD-10-CM

## 2018-05-04 DIAGNOSIS — K5900 Constipation, unspecified: Secondary | ICD-10-CM | POA: Diagnosis not present

## 2018-05-04 DIAGNOSIS — L539 Erythematous condition, unspecified: Secondary | ICD-10-CM | POA: Insufficient documentation

## 2018-05-04 DIAGNOSIS — C772 Secondary and unspecified malignant neoplasm of intra-abdominal lymph nodes: Secondary | ICD-10-CM | POA: Diagnosis not present

## 2018-05-04 DIAGNOSIS — Z86718 Personal history of other venous thrombosis and embolism: Secondary | ICD-10-CM | POA: Diagnosis not present

## 2018-05-04 DIAGNOSIS — R5383 Other fatigue: Secondary | ICD-10-CM | POA: Diagnosis not present

## 2018-05-04 DIAGNOSIS — R69 Illness, unspecified: Secondary | ICD-10-CM | POA: Diagnosis not present

## 2018-05-04 DIAGNOSIS — Z8673 Personal history of transient ischemic attack (TIA), and cerebral infarction without residual deficits: Secondary | ICD-10-CM

## 2018-05-04 DIAGNOSIS — R634 Abnormal weight loss: Secondary | ICD-10-CM | POA: Insufficient documentation

## 2018-05-04 LAB — CMP (CANCER CENTER ONLY)
ALBUMIN: 3.7 g/dL (ref 3.5–5.0)
ALT: 24 U/L (ref 0–55)
ANION GAP: 6 (ref 3–11)
AST: 26 U/L (ref 5–34)
Alkaline Phosphatase: 110 U/L (ref 40–150)
BILIRUBIN TOTAL: 0.6 mg/dL (ref 0.2–1.2)
BUN: 12 mg/dL (ref 7–26)
CHLORIDE: 110 mmol/L — AB (ref 98–109)
CO2: 21 mmol/L — ABNORMAL LOW (ref 22–29)
Calcium: 8.9 mg/dL (ref 8.4–10.4)
Creatinine: 1.05 mg/dL (ref 0.70–1.30)
GFR, Est AFR Am: >60 mL/min (ref >60)
GLUCOSE: 98 mg/dL (ref 70–140)
Potassium: 4 mmol/L (ref 3.5–5.1)
SODIUM: 137 mmol/L (ref 136–145)
TOTAL PROTEIN: 6.9 g/dL (ref 6.4–8.3)

## 2018-05-04 LAB — CBC WITH DIFFERENTIAL (CANCER CENTER ONLY)
BASOS ABS: 0 10*3/uL (ref 0.0–0.1)
Basophils Relative: 1 %
Eosinophils Absolute: 0.4 10*3/uL (ref 0.0–0.5)
Eosinophils Relative: 7 %
HEMATOCRIT: 42 % (ref 38.4–49.9)
Hemoglobin: 14.3 g/dL (ref 13.0–17.1)
Lymphocytes Relative: 21 %
Lymphs Abs: 1.2 10*3/uL (ref 0.9–3.3)
MCH: 30.7 pg (ref 27.2–33.4)
MCHC: 34 g/dL (ref 32.0–36.0)
MCV: 90.4 fL (ref 79.3–98.0)
Monocytes Absolute: 0.8 10*3/uL (ref 0.1–0.9)
Monocytes Relative: 15 %
NEUTROS ABS: 3.1 10*3/uL (ref 1.5–6.5)
Neutrophils Relative %: 56 %
Platelet Count: 205 10*3/uL (ref 140–400)
RBC: 4.64 MIL/uL (ref 4.20–5.82)
RDW: 15.4 % — ABNORMAL HIGH (ref 11.0–14.6)
WBC: 5.6 10*3/uL (ref 4.0–10.3)

## 2018-05-04 MED ORDER — PALONOSETRON HCL INJECTION 0.25 MG/5ML
INTRAVENOUS | Status: AC
  Filled 2018-05-04: qty 5

## 2018-05-04 MED ORDER — DEXAMETHASONE SODIUM PHOSPHATE 10 MG/ML IJ SOLN
INTRAMUSCULAR | Status: AC
  Filled 2018-05-04: qty 1

## 2018-05-04 MED ORDER — DEXAMETHASONE SODIUM PHOSPHATE 10 MG/ML IJ SOLN
10.0000 mg | Freq: Once | INTRAMUSCULAR | Status: AC
  Administered 2018-05-04: 10 mg via INTRAVENOUS

## 2018-05-04 MED ORDER — OXALIPLATIN CHEMO INJECTION 100 MG/20ML
128.0000 mg/m2 | Freq: Once | INTRAVENOUS | Status: AC
  Administered 2018-05-04: 300 mg via INTRAVENOUS
  Filled 2018-05-04: qty 60

## 2018-05-04 MED ORDER — DEXTROSE 5 % IV SOLN
Freq: Once | INTRAVENOUS | Status: AC
  Administered 2018-05-04: 11:00:00 via INTRAVENOUS

## 2018-05-04 MED ORDER — PALONOSETRON HCL INJECTION 0.25 MG/5ML
0.2500 mg | Freq: Once | INTRAVENOUS | Status: AC
  Administered 2018-05-04: 0.25 mg via INTRAVENOUS

## 2018-05-04 MED FILL — CAPECITABINE 500 MG TABLET: 500 | 21 days supply | Qty: 112 | Fill #1

## 2018-05-04 NOTE — Telephone Encounter (Signed)
Printed avs and calender of upcoming appointment per 5/10 los

## 2018-05-04 NOTE — Patient Instructions (Signed)
[facility] Discharge Instructions for Patients Receiving Chemotherapy  Today you received the following chemotherapy agents: Oxaliplatin.  To help prevent nausea and vomiting after your treatment, we encourage you to take your Compazine as directed. May take Zofran 3 days after Treatment if needed.   If you develop nausea and vomiting that is not controlled by your nausea medication, call the clinic.   BELOW ARE SYMPTOMS THAT SHOULD BE REPORTED IMMEDIATELY:  *FEVER GREATER THAN 100.5 F  *CHILLS WITH OR WITHOUT FEVER  NAUSEA AND VOMITING THAT IS NOT CONTROLLED WITH YOUR NAUSEA MEDICATION  *UNUSUAL SHORTNESS OF BREATH  *UNUSUAL BRUISING OR BLEEDING  TENDERNESS IN MOUTH AND THROAT WITH OR WITHOUT PRESENCE OF ULCERS  *URINARY PROBLEMS  *BOWEL PROBLEMS  UNUSUAL RASH Items with * indicate a potential emergency and should be followed up as soon as possible.  Feel free to call the clinic should you have any questions or concerns. The clinic phone number is [phone].  Please show the [facility] at check-in to the Emergency Department and triage nurse.  Oxaliplatin Injection What is this medicine? OXALIPLATIN (ox AL i PLA tin) is a chemotherapy drug. It targets fast dividing cells, like cancer cells, and causes these cells to die. This medicine is used to treat cancers of the colon and rectum, and many other cancers. This medicine may be used for other purposes; ask your health care provider or pharmacist if you have questions. COMMON BRAND NAME(S): Eloxatin What should I tell my health care provider before I take this medicine? They need to know if you have any of these conditions: -kidney disease -an unusual or allergic reaction to oxaliplatin, other chemotherapy, other medicines, foods, dyes, or preservatives -pregnant or trying to get pregnant -breast-feeding How should I use this medicine? This drug is given as an infusion into a vein. It is  administered in a hospital or clinic by a specially trained health care professional. Talk to your pediatrician regarding the use of this medicine in children. Special care may be needed. Overdosage: If you think you have taken too much of this medicine contact a poison control center or emergency room at once. NOTE: This medicine is only for you. Do not share this medicine with others. What if I miss a dose? It is important not to miss a dose. Call your doctor or health care professional if you are unable to keep an appointment. What may interact with this medicine? -medicines to increase blood counts like filgrastim, pegfilgrastim, sargramostim -probenecid -some antibiotics like amikacin, gentamicin, neomycin, polymyxin B, streptomycin, tobramycin -zalcitabine Talk to your doctor or health care professional before taking any of these medicines: -acetaminophen -aspirin -ibuprofen -ketoprofen -naproxen This list may not describe all possible interactions. Give your health care provider a list of all the medicines, herbs, non-prescription drugs, or dietary supplements you use. Also tell them if you smoke, drink alcohol, or use illegal drugs. Some items may interact with your medicine. What should I watch for while using this medicine? Your condition will be monitored carefully while you are receiving this medicine. You will need important blood work done while you are taking this medicine. This medicine can make you more sensitive to cold. Do not drink cold drinks or use ice. Cover exposed skin before coming in contact with cold temperatures or cold objects. When out in cold weather wear warm clothing and cover your mouth and nose to warm the air that goes into your lungs. Tell your doctor if you  get sensitive to the cold. This drug may make you feel generally unwell. This is not uncommon, as chemotherapy can affect healthy cells as well as cancer cells. Report any side effects. Continue your  course of treatment even though you feel ill unless your doctor tells you to stop. In some cases, you may be given additional medicines to help with side effects. Follow all directions for their use. Call your doctor or health care professional for advice if you get a fever, chills or sore throat, or other symptoms of a cold or flu. Do not treat yourself. This drug decreases your body's ability to fight infections. Try to avoid being around people who are sick. This medicine may increase your risk to bruise or bleed. Call your doctor or health care professional if you notice any unusual bleeding. Be careful brushing and flossing your teeth or using a toothpick because you may get an infection or bleed more easily. If you have any dental work done, tell your dentist you are receiving this medicine. Avoid taking products that contain aspirin, acetaminophen, ibuprofen, naproxen, or ketoprofen unless instructed by your doctor. These medicines may hide a fever. Do not become pregnant while taking this medicine. Women should inform their doctor if they wish to become pregnant or think they might be pregnant. There is a potential for serious side effects to an unborn child. Talk to your health care professional or pharmacist for more information. Do not breast-feed an infant while taking this medicine. Call your doctor or health care professional if you get diarrhea. Do not treat yourself. What side effects may I notice from receiving this medicine? Side effects that you should report to your doctor or health care professional as soon as possible: -allergic reactions like skin rash, itching or hives, swelling of the face, lips, or tongue -low blood counts - This drug may decrease the number of white blood cells, red blood cells and platelets. You may be at increased risk for infections and bleeding. -signs of infection - fever or chills, cough, sore throat, pain or difficulty passing urine -signs of decreased  platelets or bleeding - bruising, pinpoint red spots on the skin, black, tarry stools, nosebleeds -signs of decreased red blood cells - unusually weak or tired, fainting spells, lightheadedness -breathing problems -chest pain, pressure -cough -diarrhea -jaw tightness -mouth sores -nausea and vomiting -pain, swelling, redness or irritation at the injection site -pain, tingling, numbness in the hands or feet -problems with balance, talking, walking -redness, blistering, peeling or loosening of the skin, including inside the mouth -trouble passing urine or change in the amount of urine Side effects that usually do not require medical attention (report to your doctor or health care professional if they continue or are bothersome): -changes in vision -constipation -hair loss -loss of appetite -metallic taste in the mouth or changes in taste -stomach pain This list may not describe all possible side effects. Call your doctor for medical advice about side effects. You may report side effects to FDA at 1-800-FDA-1088. Where should I keep my medicine? This drug is given in a hospital or clinic and will not be stored at home. NOTE: This sheet is a summary. It may not cover all possible information. If you have questions about this medicine, talk to your doctor, pharmacist, or health care provider.  2018 Elsevier/Gold Standard (2008-07-08 17:22:47)

## 2018-05-11 ENCOUNTER — Encounter: Payer: Self-pay | Admitting: Nurse Practitioner

## 2018-05-11 ENCOUNTER — Inpatient Hospital Stay (HOSPITAL_BASED_OUTPATIENT_CLINIC_OR_DEPARTMENT_OTHER): Payer: Medicare HMO | Admitting: Nurse Practitioner

## 2018-05-11 ENCOUNTER — Inpatient Hospital Stay: Payer: Medicare HMO

## 2018-05-11 VITALS — BP 115/85 | HR 62 | Temp 97.6°F | Resp 17 | Ht 72.0 in | Wt 234.3 lb

## 2018-05-11 DIAGNOSIS — R5383 Other fatigue: Secondary | ICD-10-CM

## 2018-05-11 DIAGNOSIS — Z5111 Encounter for antineoplastic chemotherapy: Secondary | ICD-10-CM | POA: Diagnosis not present

## 2018-05-11 DIAGNOSIS — C187 Malignant neoplasm of sigmoid colon: Secondary | ICD-10-CM | POA: Diagnosis not present

## 2018-05-11 DIAGNOSIS — I10 Essential (primary) hypertension: Secondary | ICD-10-CM

## 2018-05-11 DIAGNOSIS — R634 Abnormal weight loss: Secondary | ICD-10-CM

## 2018-05-11 DIAGNOSIS — I82409 Acute embolism and thrombosis of unspecified deep veins of unspecified lower extremity: Secondary | ICD-10-CM

## 2018-05-11 DIAGNOSIS — C772 Secondary and unspecified malignant neoplasm of intra-abdominal lymph nodes: Principal | ICD-10-CM

## 2018-05-11 DIAGNOSIS — L539 Erythematous condition, unspecified: Secondary | ICD-10-CM | POA: Diagnosis not present

## 2018-05-11 DIAGNOSIS — Z8673 Personal history of transient ischemic attack (TIA), and cerebral infarction without residual deficits: Secondary | ICD-10-CM

## 2018-05-11 DIAGNOSIS — K5900 Constipation, unspecified: Secondary | ICD-10-CM | POA: Diagnosis not present

## 2018-05-11 LAB — CMP (CANCER CENTER ONLY)
ALBUMIN: 3.9 g/dL (ref 3.5–5.0)
ALT: 24 U/L (ref 0–55)
AST: 31 U/L (ref 5–34)
Alkaline Phosphatase: 116 U/L (ref 40–150)
Anion gap: 7 (ref 3–11)
BUN: 15 mg/dL (ref 7–26)
CHLORIDE: 106 mmol/L (ref 98–109)
CO2: 25 mmol/L (ref 22–29)
CREATININE: 1.24 mg/dL (ref 0.70–1.30)
Calcium: 9.5 mg/dL (ref 8.4–10.4)
GFR, Est AFR Am: >60 mL/min (ref >60)
GFR, Estimated: 58 mL/min — ABNORMAL LOW (ref >60)
GLUCOSE: 99 mg/dL (ref 70–140)
POTASSIUM: 4.5 mmol/L (ref 3.5–5.1)
SODIUM: 138 mmol/L (ref 136–145)
Total Bilirubin: 0.7 mg/dL (ref 0.2–1.2)
Total Protein: 7.4 g/dL (ref 6.4–8.3)

## 2018-05-11 LAB — CBC WITH DIFFERENTIAL (CANCER CENTER ONLY)
BASOS PCT: 1 %
Basophils Absolute: 0.1 10*3/uL (ref 0.0–0.1)
EOS ABS: 0.7 10*3/uL — AB (ref 0.0–0.5)
EOS PCT: 8 %
HCT: 44 % (ref 38.4–49.9)
HEMOGLOBIN: 14.9 g/dL (ref 13.0–17.1)
LYMPHS ABS: 1.4 10*3/uL (ref 0.9–3.3)
Lymphocytes Relative: 17 %
MCH: 30.6 pg (ref 27.2–33.4)
MCHC: 33.9 g/dL (ref 32.0–36.0)
MCV: 90.5 fL (ref 79.3–98.0)
MONOS PCT: 11 %
Monocytes Absolute: 0.9 10*3/uL (ref 0.1–0.9)
Neutro Abs: 5.2 10*3/uL (ref 1.5–6.5)
Neutrophils Relative %: 63 %
PLATELETS: 137 10*3/uL — AB (ref 140–400)
RBC: 4.86 MIL/uL (ref 4.20–5.82)
RDW: 15 % — AB (ref 11.0–14.6)
WBC Count: 8.3 10*3/uL (ref 4.0–10.3)

## 2018-05-11 NOTE — Progress Notes (Signed)
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 Watt Climes -An infiltrative non-obstructing small mass was found in the mid sigmoid colon. Mass was partially circumferential (involving one third of the lumen circumference). The mass measured 2 cm in length, diameter was 2 mm. No bleeding was present.   -external and internal hemorrhoids -diverticulosis in the sigmoid colon, in the descending colon, in the transverse colon, and at the hepatic flexure -one medium polyp in the mid transverse colon  -ten small polyps in the rectum, in the sigmoid colon, in the descending colon, and in the transverse colon -likely malignant tumor in the mid sigmoid colon -exam was otherwise normal       02/26/2018 Initial Biopsy    From colonoscopy: 1. LG intestine- transverse colon, descending, polyp -Tubular adenomas (8). Sessile serrated adenoma/polyp 2. LG intestine - sigmoid colon bx -INVASIVE WELL DIFFERENTIATED ADENOCARCINOMA 3. LG intestine -sigmoid colon, rectum, polyp -Tubular adenoma with high grade dysplasia. Hypoplastic polyps (3).      02/26/2018 Initial Diagnosis    Colon cancer ([facility])      02/28/2018 Imaging    CT AP IMPRESSION: -No evidence of metastatic disease or other acute findings. -Colonic diverticulosis, without radiographic evidence of diverticulitis. -Mild hepatic steatosis. -Mildly enlarged prostate.       03/07/2018 Tumor  Marker    CEA 1.3 (pre-op)      03/08/2018 Surgery    PROCEDURE:  Procedure(s): LAPAROSCOPIC ASSISTED SIGMOID COLECTOMY ERAS PATHWAY  MOBILIZATION OF SPLENIC FLEXURE RIGID SIGMOIDOSCOPY      03/08/2018 Cancer Staging    Staging form: Colon and Rectum, AJCC 8th Edition - Pathologic stage from 03/08/2018: Stage IIIB (pT3, pN1b, cM0) - Signed by Alla Feeling, NP on 04/05/2018      03/08/2018 Pathology Results    Diagnosis 1. Colon, segmental resection for tumor, sigmoid, stitch marks distal - INVASIVE ADENOCARCINOMA, MODERATELY DIFFERENTIATED, SPANNING 2.4 CM. - TUMOR FOCALLY INVADES THROUGH MUSCULARIS PROPRIA. - RESECTION MARGIN IS NEGATIVE. - METASTATIC CARCINOMA IN TWO OF TWENTY LYMPH NODES (2/20). - DIVERTICULOSIS. - HYPERPLASTIC POLYP (X2). - SEE ONCOLOGY TABLE. -MMR normal       04/09/2018 Imaging    CT Chest WO Contrast 04/09/18 IMPRESSION: 1. Stable exam. Scattered bilateral tiny pulmonary nodules without interval change. No new or progressive pulmonary nodule or mass. Attention on follow-up may be warranted. 2. Ascending thoracic aorta measures up to 4.9 cm maximum diameter, increased from 4.6 cm on prior study. Ascending thoracic aortic aneurysm. Recommend semi-annual imaging followup by CTA or MRA and referral to cardiothoracic surgery if not already obtained. This recommendation follows 2010 ACCF/AHA/AATS/ACR/ASA/SCA/SCAI/SIR/STS/SVM Guidelines for the Diagnosis and Management of Patients With Thoracic Aortic Disease. Circulation. 2010; 121: E831-D176      04/13/2018 -  Chemotherapy    The patient had palonosetron (ALOXI) injection 0.25 mg, 0.25 mg, Intravenous,  Once, 2 of 4 cycles Administration: 0.25 mg (04/13/2018), 0.25 mg (05/04/2018) oxaliplatin (ELOXATIN) 250 mg in dextrose 5 %  500 mL chemo infusion, 106 mg/m2 = 260 mg (100 % of original dose 110 mg/m2), Intravenous,  Once, 2 of 4 cycles Dose modification: 110 mg/m2 (original dose 110 mg/m2, Cycle 1, Reason:  Provider Judgment, Comment: due to his age ), 130 mg/m2 (original dose 110 mg/m2, Cycle 2, Reason: Provider Judgment) Administration: 250 mg (04/13/2018), 300 mg (05/04/2018)  for chemotherapy treatment.      CURRENT THERAPY: Adjuvant CAPOX every 3 weeks. 2000 mg Xeloda BID on days 1-14, every 21 days   INTERVAL HISTORY: Christian Weeks presents for follow-up as scheduled.  He completed cycle 2 with full dose oxaliplatin on 05/04/2018. He continues Xeloda 2000 mg twice daily, will complete in 1 week.  He noticed increased fatigue with cycle 2 lasting from days 3-6.  Missed one day of work and was still able to remain somewhat active.  Appetite is improving although he has lost weight. Denies mucositis, nausea, vomiting, or diarrhea.  He has mild constipation but uses stool softener as needed.  He notes with cycle 1 his cold sensitivity was resolved by the end of the first week but it persists today, improving overall.  For first few days after treatment he had labored breathing with prolonged walking especially in cold temperatures that is now resolved.  He experienced tingling in the right forearm for a few days after treatment, this was the arm with the IV for his treatment.  No wounds or skin breakdown.  He has intermittent muscle spasms in his fingers without numbness or tingling.  Red without blisters, peeling, or sensitivity.  REVIEW OF SYSTEMS:   Constitutional: Denies fevers, chills or abnormal weight loss (+) prolonged fatigue days 3 - 6 (+) decreased taste and appetite, improving Eyes: Denies blurriness of vision Ears, nose, mouth, throat, and face: Denies mucositis or sore throat (+) sensitivity to mouth and throat lasting 1+ week Respiratory: Denies cough, dyspnea or wheezes (+) labored breathing with prolonged walking especially in cold temperatures, now resolved Cardiovascular: Denies palpitation, chest discomfort or lower extremity swelling Gastrointestinal:  Denies nausea, vomiting,  diarrhea, hematochezia, heartburn or change in bowel habits (+) mild constipation, stool softener as needed Skin: Denies abnormal skin rashes (+) tingling to right forearm with PIV for chemo, now resolved (+) mild palmar erythema without blisters, sensitivity, or peeling Lymphatics: Denies new lymphadenopathy or easy bruising Neurological:Denies numbness, tingling or new weaknesses (+) sensitivity lasting 1+ week Behavioral/Psych: Mood is stable, no new changes  All other systems were reviewed with the patient and are negative.  MEDICAL HISTORY:  Past Medical History:  Diagnosis Date  . Arthritis   . Blood clot in vein 01/2016  . Colon cancer ([facility])   . Degenerative joint disease   . History of kidney stones age 56's  . Hypertension   . Hypothyroidism   . Mass of colon    sigmoid  . Stroke ([facility]) 01/2016   speech difficulty for a few hours after knee replacement left  . Thoracic ascending aortic aneurysm ([facility])    4.6-4.7 cm followed by Dr Servando Snare- LOV- 08/2017-epic   . Wears dentures   . Wears glasses     SURGICAL HISTORY: Past Surgical History:  Procedure Laterality Date  . colonscopy  02/26/2018  . KNEE ARTHROPLASTY Left 01/15/2016   Procedure: COMPUTER ASSISTED TOTAL KNEE ARTHROPLASTY;  Surgeon: [name], MD;  Location: [facility];  Service: Orthopedics;  Laterality: Left;  . LAPAROSCOPIC SIGMOID COLECTOMY N/A 03/08/2018   Procedure: LAPAROSCOPIC ASSISTED SIGMOID COLECTOMY ERAS PATHWAY;  Surgeon:  Jovita Kussmaul, MD;  Location: [facility];  Service: General;  Laterality: N/A;  . MULTIPLE TOOTH EXTRACTIONS    . TOTAL KNEE ARTHROPLASTY Right 01/13/2017   Procedure: RIGHT TOTAL KNEE ARTHROPLASTY;  Surgeon: [name], MD;  Location: [facility];  Service: Orthopedics;  Laterality: Right;    I have reviewed the social history and family history with the patient and they are unchanged from previous note.  ALLERGIES:  is allergic to poison oak extract.  MEDICATIONS:  Current Outpatient  Medications  Medication Sig Dispense Refill  . acetaminophen (TYLENOL) 500 MG tablet Take 1,000 mg by mouth every 6 (six) hours as needed for moderate pain or headache.     . capecitabine (XELODA) 500 MG tablet Take 4 tablets (2,000 mg total) by mouth 2 (two) times daily after a meal. Take on days 1-14 of chemotherapy. Repeat every 21 days. 112 tablet 1  . Cholecalciferol (VITAMIN D3) 3000 units TABS Take 3,000 Units by mouth daily.    Marland Kitchen levothyroxine (SYNTHROID, LEVOTHROID) 137 MCG tablet Take 137 mcg by mouth daily before breakfast.    . losartan (COZAAR) 100 MG tablet Take 0.5 tablets (50 mg total) by mouth daily. 90 tablet 3  . ondansetron (ZOFRAN) 8 MG tablet Take 1 tablet (8 mg total) by mouth 2 (two) times daily as needed for refractory nausea / vomiting. Start on day 3 after chemotherapy. 30 tablet 1  . prochlorperazine (COMPAZINE) 10 MG tablet Take 1 tablet (10 mg total) by mouth every 6 (six) hours as needed (Nausea or vomiting). 30 tablet 1   No current facility-administered medications for this visit.     PHYSICAL EXAMINATION: ECOG PERFORMANCE STATUS: 1 - Symptomatic but completely ambulatory  Vitals:   05/11/18 1313  BP: 115/85  Pulse: 62  Resp: 17  Temp: 97.6 F (36.4 C)  SpO2: 98%   Filed Weights   05/11/18 1313  Weight: 234 lb 4.8 oz (106.3 kg)    GENERAL:alert, no distress and comfortable SKIN: no rashes or significant lesions.  Mild palmar erythema (+) mild bruise to right anterior forearm at previous PIV site EYES: normal, Conjunctiva are pink and non-injected, sclera clear OROPHARYNX:no exudate, no erythema and lips, buccal mucosa, and tongue normal  LYMPH:  no palpable cervical or supraclavicular lymphadenopathy  LUNGS: clear to auscultation with normal breathing effort HEART: regular rate & rhythm and no murmurs and no lower extremity edema ABDOMEN:abdomen soft, non-tender and normal bowel sounds Musculoskeletal:no cyanosis of digits and no clubbing    NEURO: alert & oriented x 3 with fluent speech, no focal motor/sensory deficits  LABORATORY DATA:  I have reviewed the data as listed CBC Latest Ref Rng & Units 05/11/2018 05/04/2018 04/13/2018  WBC 4.0 - 10.3 K/uL 8.3 5.6 6.0  Hemoglobin 13.0 - 17.1 g/dL 14.9 14.3 15.3  Hematocrit 38.4 - 49.9 % 44.0 42.0 44.8  Platelets 140 - 400 K/uL 137(L) 205 181     CMP Latest Ref Rng & Units 05/11/2018 05/04/2018 04/13/2018  Glucose 70 - 140 mg/dL 99 98 101  BUN 7 - 26 mg/dL _0 Creatinine 0.70 - 1.30 mg/dL 1.24 1.05 1.03  Sodium 136 - 145 mmol/L 138 137 137  Potassium 3.5 - 5.1 mmol/L 4.5 4.0 4.3  Chloride 98 - 109 mmol/L 106 110(H) 109  CO2 22 - 29 mmol/L 25 21(L) 21(L)  Calcium 8.4 - 10.4 mg/dL 9.5 8.9 9.2  Total Protein 6.4 - 8.3 g/dL 7.4 6.9 7.3  Total Bilirubin 0.2 -  1.2 mg/dL 0.7 0.6 0.7  Alkaline Phos 40 - 150 U/L 116 110 109  AST 5 - 34 U/L _0 ALT 0 - 55 U/L _1 CEA 02/28/18 1.3 baseline    SURGICAL PATHOLOGY  Diagnosis 03/08/18 1. Colon, segmental resection for tumor, sigmoid, stitch marks distal - INVASIVE ADENOCARCINOMA, MODERATELY DIFFERENTIATED, SPANNING 2.4 CM. - TUMOR FOCALLY INVADES THROUGH MUSCULARIS PROPRIA. - RESECTION MARGIN IS NEGATIVE. - METASTATIC CARCINOMA IN TWO OF TWENTY LYMPH NODES (2/20). - DIVERTICULOSIS. - HYPERPLASTIC POLYP (X2). - SEE ONCOLOGY TABLE. 2. Colon, resection margin (donut), sigmoid, proximal margin - BENIGN COLONIC MUCOSA. - NO DYSPLASIA OR MALIGNANCY. 3. Colon, resection margin (donut), distal ring - BENIGN COLONIC MUCOSA. - NO DYSPLASIA OR MALIGNANCY. Microscopic Comment 1. COLON AND RECTUM (INCLUDING TRANS-ANAL RESECTION): Specimen: Sigmoid colon. Procedure: Segmental resection. Tumor site: Sigmoid. Specimen integrity: Intact. Macroscopic intactness of mesorectum: N/A. Macroscopic tumor perforation: Not identified. Invasive tumor: Maximum size: 2.4 cm Histologic type(s): Invasive adenocarcinoma. Histologic  grade and differentiation: G2: moderately differentiated/low grade Type of polyp in which invasive carcinoma arose: N/A. Microscopic extension of invasive tumor: Tumor focally invades through muscularis propria. Lymph-Vascular invasion: Present. Peri-neural invasion: Not identified. Tumor deposit(s) (discontinuous extramural extension): Not identified. Resection margins: Negative. Proximal margin: Negative. Distal margin: Negative. Circumferential (radial) (posterior ascending, posterior descending; lateral and posterior mid-rectum; and entire lower 1/3 rectum): N/A. Mesenteric margin (sigmoid and transverse): Negative. Distance closest margin (if all above margins negative): >3.2 cm distal. Treatment effect (neo-adjuvant therapy): N/A. Additional polyp(s): Hyperplastic polyp (X2) Non-neoplastic findings: Diverticulosis. Lymph nodes: number examined 19; number positive: 2 Pathologic Staging: pT3, pN1b, pMX Ancillary studies: MSI and MMR will be ordered.  MMR - normal   RADIOGRAPHIC STUDIES: I have personally reviewed the radiological images as listed and agreed with the findings in the report. No results found.   ASSESSMENT & PLAN: Christian Weeks is a 67 y.o. white male with history of intermittent hematochezia for some time attributing to hemorrhoids, positive cologuard test and found to have non-obstructing mid sigmoid mass on colonoscopy   1. Sigmoid colon adenocarcinoma, well differentiated, pT3N1b,M0, stage IIIB, MMR-normal 2.  Genetics 3.  Weight loss 4.  HTN, thoracic aneurysm 5.  DVT, stroke  Christian Weeks appears stable.  He began cycle 2 Capox on 05/04/2018 with full dose oxaliplatin and Xeloda 2000 mg twice daily x14 days on and 7 days off.  He will complete chemo pills in 1 week.  He experienced increased and prolonged fatigue and cold sensitivity with cycle 2 which are improving overall.  His weight fluctuates on chemotherapy, he lost 7 pounds in 1 week. I encouraged him  to drink boost/Ensure while his appetite recovers.  Overall he appears to be tolerating treatment moderately well.  He has very mild palmar erythema, I instructed him to start over-the-counter hydrocortisone cream to his hands and feet should he experience more signs of hand-foot syndrome.  He works outside, I instructed him to use sunscreen and protective clothing/hats while he is out in the sun.  He agrees to the above recommendations.  Will return for lab and follow-up in 2 weeks with cycle 3.  Plan -Labs reviewed, mild thrombocytopenia secondary to chemotherapy, otherwise stable  -Increase p.o. intake, supplement with boost/Ensure while appetite recovers -Protect skin from the sun -Lab and follow-up in 2 weeks with cycle 3 Capox -Hydrocortisone if you experience hand-foot syndrome   All questions were answered. The patient knows to call the clinic with any problems, questions  or concerns. No barriers to learning was detected.     Alla Feeling, NP 05/11/18

## 2018-05-14 ENCOUNTER — Telehealth: Payer: Self-pay | Admitting: Nurse Practitioner

## 2018-05-14 NOTE — Telephone Encounter (Signed)
No LOS 5/17

## 2018-05-15 ENCOUNTER — Other Ambulatory Visit: Payer: Self-pay

## 2018-05-15 ENCOUNTER — Encounter: Payer: Self-pay | Admitting: Cardiothoracic Surgery

## 2018-05-15 ENCOUNTER — Ambulatory Visit
Admission: RE | Admit: 2018-05-15 | Discharge: 2018-05-15 | Disposition: A | Discharge status: Home / Self Care | Payer: Medicare HMO | Source: Ambulatory Visit | Attending: Cardiothoracic Surgery | Admitting: Cardiothoracic Surgery

## 2018-05-15 ENCOUNTER — Ambulatory Visit: Payer: Medicare HMO | Admitting: Cardiothoracic Surgery

## 2018-05-15 VITALS — BP 102/74 | HR 55 | Resp 16 | Ht 72.0 in | Wt 240.0 lb

## 2018-05-15 DIAGNOSIS — I712 Thoracic aortic aneurysm, without rupture, unspecified: Secondary | ICD-10-CM

## 2018-05-15 DIAGNOSIS — I7121 Aneurysm of the ascending aorta, without rupture: Secondary | ICD-10-CM

## 2018-05-15 MED ORDER — IOPAMIDOL (ISOVUE-370) INJECTION 76%
75.0000 mL | Freq: Once | INTRAVENOUS | Status: AC | PRN
  Administered 2018-05-15: 75 mL via INTRAVENOUS

## 2018-05-15 NOTE — Patient Instructions (Signed)

## 2018-05-15 NOTE — Progress Notes (Signed)
[address]       [place] [address]             [phone]                    Christian Weeks [facility] Medical Record #[id number] Date of Birth: 01-Sep-1951  Referring: [name], MD Primary Care: Deland Pretty, MD  Chief Complaint:    Chief Complaint  Patient presents with  . TAA    8 month f/u with CTA CHEST    History of Present Illness:    Christian Weeks 67 y.o. male is seen in follow-up for dilated ascending aorta.  He returns today with a follow-up CT scan. 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  He had a surgical resection with 2 out of 20 nodes positive.  he is currently on Adjuvant CAPOX every 3 weeks. 2000 mg Xeloda BID on days 1-14, every 21 days .   He is tolerating this well.  For evaluation of his colon cancer noncontrast CT scan of the chest was done in April suggesting that his aorta was 4.9 cm.  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 This resolved fairly quickly. Echocardiogram did not confirm a patent foramen on bubble test, transcranial Doppler did. At the time the patient was noted to have a left lower extremity DVT. He notes he was anticoagulated for one month.   He returned in early 2018 to have the right knee replaced, on a preop chest x-ray it was suggested that he had a descending thoracic aneurysm. A CT of the chest was performed, there was no descending thoracic aneurysm there was mild dilatation of the ascending aorta, 4.7 cm. echocardiogram showed a trileaflet  aortic valve without stenosis or insufficiency.  The patient has no family history of aortic aneurysm, Marfan's or other connective tissue disorder, there is no family history of young family members dying suddenly without known cause. Patient notes his mother died of congestive heart failure at age 45 father died at age 2 possibly from a myocardial infarction. One sister died at age 64 with severe COPD, he has to other brothers 58 and 64 and a sister age 3 for left.   Current Activity/ Functional Status:  Patient is independent with mobility/ambulation, transfers, ADL's, IADL's.   Zubrod Score: At the time of surgery this patient's most appropriate activity status/level should be described as: []     0    Normal activity, no symptoms [x]     1    Restricted in physical strenuous activity but ambulatory, able to do out light work []     2    Ambulatory and capable of self care, unable to do work activities, up and about               >50 % of waking hours                              []   3    Only limited self care, in bed greater than 50% of waking hours []     4    Completely disabled, no self care, confined to bed or chair []     5    Moribund   Past Medical History:  Diagnosis Date  . Arthritis   . Blood clot in vein 01/2016  . Colon cancer (Christian Weeks)   . Degenerative joint disease   . History of kidney stones age 25's  . Hypertension   . Hypothyroidism   . Mass of colon    sigmoid  . Stroke ([place]) 01/2016   speech difficulty for a few hours after knee replacement left  . Thoracic ascending aortic aneurysm ([facility])    4.6-4.7 cm followed by Dr Servando Snare- LOV- 08/2017-epic   . Wears dentures   . Wears glasses     Past Surgical History:  Procedure Laterality Date  . colonscopy  02/26/2018  . KNEE ARTHROPLASTY Left 01/15/2016   Procedure: COMPUTER ASSISTED TOTAL KNEE ARTHROPLASTY;  Surgeon: [name], MD;  Location: [facility];  Service: Orthopedics;  Laterality: Left;  . LAPAROSCOPIC  SIGMOID COLECTOMY N/A 03/08/2018   Procedure: LAPAROSCOPIC ASSISTED SIGMOID COLECTOMY ERAS PATHWAY;  Surgeon: Jovita Kussmaul, MD;  Location: [facility];  Service: General;  Laterality: N/A;  . MULTIPLE TOOTH EXTRACTIONS    . TOTAL KNEE ARTHROPLASTY Right 01/13/2017   Procedure: RIGHT TOTAL KNEE ARTHROPLASTY;  Surgeon: [name], MD;  Location: [facility];  Service: Orthopedics;  Laterality: Right;    Family History  Problem Relation Age of Onset  . Congestive Heart Failure Mother   . Diabetes Mother   . Thyroid disease Sister     Social History   Social History  . Marital status: Widowed- Patient's wife died in 04-21-16     Spouse name: N/A  . Number of children: N/A  . Years of education: N/A   Occupational History  . Not on file.   Social History Main Topics  . Smoking status: Former Smoker    Quit date: 11/16/1996  . Smokeless tobacco: Never Used  . Alcohol use No  . Drug use: No  . Sexual activity: Not on file      Social History   Tobacco Use  Smoking Status Former Smoker  . Packs/day: 1.50  . Years: 30.00  . Pack years: 45.00  . Types: Cigarettes  . Last attempt to quit: 11/16/1996  . Years since quitting: 21.5  Smokeless Tobacco Never Used  Tobacco Comment   quit 25 yrs ago    Social History   Substance and Sexual Activity  Alcohol Use No     Allergies  Allergen Reactions  . Poison Oak Extract Rash    Current Outpatient Medications  Medication Sig Dispense Refill  . acetaminophen (TYLENOL) 500 MG tablet Take 1,000 mg by mouth every 6 (six) hours as needed for moderate pain or headache.     . capecitabine (XELODA) 500 MG tablet Take 4 tablets (2,000 mg total) by mouth 2 (two) times daily after a meal. Take on days 1-14 of chemotherapy. Repeat every 21 days. 112 tablet 1  . Cholecalciferol (VITAMIN D3) 3000 units TABS Take 3,000 Units by mouth daily.    Marland Kitchen levothyroxine (SYNTHROID, LEVOTHROID) 137 MCG tablet Take 137 mcg by mouth daily before breakfast.      . losartan (COZAAR) 100 MG tablet Take 0.5 tablets (50 mg total) by mouth daily. 90 tablet 3  . ondansetron (ZOFRAN) 8 MG tablet  Take 1 tablet (8 mg total) by mouth 2 (two) times daily as needed for refractory nausea / vomiting. Start on day 3 after chemotherapy. (Patient not taking: Reported on 05/15/2018) 30 tablet 1  . prochlorperazine (COMPAZINE) 10 MG tablet Take 1 tablet (10 mg total) by mouth every 6 (six) hours as needed (Nausea or vomiting). (Patient not taking: Reported on 05/15/2018) 30 tablet 1   No current facility-administered medications for this visit.       Review of Systems:  Review of Systems  Constitutional: Negative.   HENT: Negative.   Eyes: Negative.   Respiratory: Negative.   Cardiovascular: Negative.   Gastrointestinal: Positive for constipation and diarrhea. Negative for abdominal pain, blood in stool, heartburn, melena, nausea and vomiting.  Genitourinary: Negative.   Musculoskeletal: Negative.   Skin: Negative.   Neurological: Negative.   Endo/Heme/Allergies: Negative.   Psychiatric/Behavioral: Negative.        Physical Exam: BP 102/74 (BP Location: Left Arm, Patient Position: Sitting, Cuff Size: Large)   Pulse (!) 55   Resp 16   Ht 6' (1.829 m)   Wt 240 lb (108.9 kg)   SpO2 97% Comment: ON RA  BMI 32.55 kg/m   PHYSICAL EXAMINATION: General appearance: alert and cooperative Head: Normocephalic, without obvious abnormality, atraumatic Neck: no adenopathy, no carotid bruit, no JVD, supple, symmetrical, trachea midline and thyroid not enlarged, symmetric, no tenderness/mass/nodules Lymph nodes: Cervical, supraclavicular, and axillary nodes normal. Resp: clear to auscultation bilaterally Back: symmetric, no curvature. ROM normal. No CVA tenderness. Cardio: regular rate and rhythm, S1, S2 normal, no murmur, click, rub or gallop GI: soft, non-tender; bowel sounds normal; no masses,  no organomegaly Extremities: extremities normal, atraumatic, no  cyanosis or edema Neurologic: Grossly normal Lower abdominal incision from his recent colon resection is healing well  Diagnostic Studies & Laboratory data:     Recent Radiology Findings: Ct Angio Chest Aorta W &/or Wo Contrast  Result Date: 05/15/2018 CLINICAL DATA:  Thoracic aortic aneurysm without rupture. EXAM: CT ANGIOGRAPHY CHEST WITH CONTRAST TECHNIQUE: Multidetector CT imaging of the chest was performed using the standard protocol during bolus administration of intravenous contrast. Multiplanar CT image reconstructions and MIPs were obtained to evaluate the vascular anatomy. CONTRAST:  75 mL of Isovue 370 intravenously. COMPARISON:  CT scan of April 09, 2018 and September 21, 2017. FINDINGS: Cardiovascular: 4.7 cm ascending thoracic aortic aneurysm is noted. No dissection is noted. Great vessels are widely patent. Transverse thoracic aorta measures 3.2 cm. Proximal descending thoracic aorta measures 2.7 cm. Normal pericardial effusion is noted. Mediastinum/Nodes: No enlarged mediastinal, hilar, or axillary lymph nodes. Thyroid gland, trachea, and esophagus demonstrate no significant findings. Lungs/Pleura: Lungs are clear. No pleural effusion or pneumothorax. Upper Abdomen: No acute abnormality. Musculoskeletal: No chest wall abnormality. No acute or significant osseous findings. Review of the MIP images confirms the above findings. IMPRESSION: 4.7 cm ascending thoracic aortic aneurysm. Ascending thoracic aortic aneurysm. Recommend semi-annual imaging followup by CTA or MRA and referral to cardiothoracic surgery if not already obtained. This recommendation follows 2010 ACCF/AHA/AATS/ACR/ASA/SCA/SCAI/SIR/STS/SVM Guidelines for the Diagnosis and Management of Patients With Thoracic Aortic Disease. Circulation. 2010; 121: E993-Z169. Electronically Signed   By: Marijo Conception, M.D.   On: 05/15/2018 14:40    Ct Angio Chest Aorta W/cm &/or Wo/cm  Result Date: 09/21/2017 CLINICAL DATA:  Follow-up  thoracic aortic aneurysm EXAM: CT ANGIOGRAPHY CHEST WITH CONTRAST TECHNIQUE: Multidetector CT imaging of the chest was performed using the standard protocol during bolus administration of intravenous contrast.  Multiplanar CT image reconstructions and MIPs were obtained to evaluate the vascular anatomy. CONTRAST:  80 cc Isovue 370 IV COMPARISON:  01/14/2017 FINDINGS: Cardiovascular: Stable ascending thoracic aorta, measuring up to 4.6 cm compared to 4.7 cm previously. No evidence of dissection. Heart is borderline in size. Scattered aortic and coronary artery calcifications. Mediastinum/Nodes: No mediastinal, hilar, or axillary adenopathy. Lungs/Pleura: Linear areas of scarring in the lingula. Lingular nodule measures 5 mm on image 78, stable. Nodule along the right minor fissure measures 5 mm on image 67, stable slight nodularity along the left major fissure on image 59 is stable. Left upper lobe nodule on image 54 measures 5 mm, stable. No new or enlarging pulmonary nodules. No pleural effusions. Upper Abdomen: Imaging into the upper abdomen shows no acute findings. Musculoskeletal: Chest wall soft tissues are unremarkable. No acute bony abnormality. Review of the MIP images confirms the above findings. 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 Recommend semi-annual imaging followup by CTA or MRA and referral to cardiothoracic surgery if not already obtained. This recommendation follows 2010 ACCF/AHA/AATS/ACR/ASA/SCA/SCAI/SIR/STS/SVM Guidelines for the Diagnosis and Management of Patients With Thoracic Aortic Disease. Circulation. 2010; 121: W431-V400 Scattered small pulmonary nodules are stable. No follow-up needed if patient is low-risk (and has no known or suspected primary neoplasm). Non-contrast chest CT can be considered in 12 months if patient is high-risk. This recommendation follows the consensus statement: Guidelines for Management of Incidental Pulmonary Nodules Detected on CT  Images: From the Fleischner Society 2017; Radiology 2017; 284:228-243. Electronically Signed   By: Rolm Baptise M.D.   On: 09/21/2017 15:20        CLINICAL DATA:  Unexplained retrocardiac density on a recent lateral chest radiograph view. Inpatient.  EXAM: CT CHEST WITH CONTRAST  TECHNIQUE: Multidetector CT imaging of the chest was performed during intravenous contrast administration.  CONTRAST:  52mL ISOVUE-300 IOPAMIDOL (ISOVUE-300) INJECTION 61%  COMPARISON:  01/04/2017 chest radiograph.  FINDINGS: Cardiovascular: Top-normal heart size. No significant pericardial fluid/thickening. Left anterior descending coronary atherosclerosis. Atherosclerotic and tortuous thoracic aorta with aneurysmal 4.7 cm maximum diameter ascending thoracic aorta. Normal caliber pulmonary arteries. No central pulmonary emboli.  Mediastinum/Nodes: Heterogeneous and atrophic appearing thyroid gland without discrete thyroid nodules. Unremarkable esophagus. No pathologically enlarged axillary, mediastinal or hilar lymph nodes.  Lungs/Pleura: No pneumothorax. No pleural effusion. There are four scattered solid pulmonary nodules, largest 5 mm in the right middle lobe associated with the minor fissure (series 3/image 79) and 5 mm in the lingula (series 3/ image 100). No acute consolidative airspace disease, lung masses or additional significant pulmonary nodules. Mild centrilobular and paraseptal emphysema.  Upper abdomen: Unremarkable.  Musculoskeletal: No aggressive appearing focal osseous lesions. Moderate thoracic spondylosis.  IMPRESSION: 1. Retrocardiac density on the lateral chest radiograph view correlates with focal tortuosity of the descending thoracic aorta. No lung masses. No consolidative airspace disease. 2. Four scattered solid pulmonary nodules, largest 5 mm. No follow-up needed if patient is low-risk (and has no known or suspected primary neoplasm). Non-contrast chest CT  can be considered in 12 months if patient is high-risk. This recommendation follows the consensus statement: Guidelines for Management of Incidental Pulmonary Nodules Detected on CT Images: From the Fleischner Society 2017; Radiology 2017; 284:228-243. 3. Aortic atherosclerosis. Ascending thoracic aortic aneurysm, maximum diameter 4.7 cm. Ascending thoracic aortic aneurysm. Recommend semi-annual imaging followup by CTA or MRA and referral to cardiothoracic surgery if not already obtained. This recommendation follows 2010 ACCF/AHA/AATS/ACR/ASA/SCA/SCAI/SIR/STS/SVM Guidelines for the Diagnosis and Management of Patients With Thoracic Aortic  Disease. Circulation. 2010; 121: O242-P536. 4. Mild emphysema.      Recent Lab Findings: Lab Results  Component Value Date   WBC 8.3 05/11/2018   HGB 14.9 05/11/2018   HCT 44.0 05/11/2018   PLT 137 (L) 05/11/2018   GLUCOSE 99 05/11/2018   CHOL 141 02/05/2016   TRIG 300 (H) 02/05/2016   HDL 26 (L) 02/05/2016   LDLCALC 55 02/05/2016   ALT 24 05/11/2018   AST 31 05/11/2018   NA 138 05/11/2018   K 4.5 05/11/2018   CL 106 05/11/2018   CREATININE 1.24 05/11/2018   BUN 15 05/11/2018   CO2 25 05/11/2018   TSH 5.938 (H) 02/06/2016   INR 1.04 11/16/2016   HGBA1C 5.4 02/05/2016   ECHO:01/2016 LV EF: 50% -   55%  ------------------------------------------------------------------- Indications:      CVA 436.  ------------------------------------------------------------------- Study Conclusions  - Left ventricle: The cavity size was normal. Wall thickness was   normal. Systolic function was normal. The estimated ejection   fraction was in the range of 50% to 55%. Wall motion was normal;   there were no regional wall motion abnormalities. Doppler   parameters are consistent with abnormal left ventricular   relaxation (grade 1 diastolic dysfunction). - Aortic root: The aortic root was mildly dilated. - Mitral valve: Calcified  annulus.  Impressions:  - Technically difficult; LV function low normal to mildly reduced   (EF 50); saline microcavitation study with late bubbles felt to   be nondiagnostic; if clinically indicated, TEE would have greater   sensitivity for source of embolus.  Transthoracic echocardiography.  M-mode, complete 2D, spectral Doppler, and color Doppler.  Birthdate:  Patient birthdate: 1950/12/29.  Age:  Patient is 67 yr old.  Sex:  Gender: male. BMI: 35.8 kg/m^2.  Blood pressure:     123/94  Patient status: Inpatient.  Study date:  Study date: 02/07/2016. Study time: 10:04 AM.  Location:  Bedside.  -------------------------------------------------------------------  ------------------------------------------------------------------- Left ventricle:  The cavity size was normal. Wall thickness was normal. Systolic function was normal. The estimated ejection fraction was in the range of 50% to 55%. Wall motion was normal; there were no regional wall motion abnormalities. Doppler parameters are consistent with abnormal left ventricular relaxation (grade 1 diastolic dysfunction).  ------------------------------------------------------------------- Aortic valve:   Trileaflet; mildly calcified leaflets. Mobility was not restricted.  Doppler:  Transvalvular velocity was within the normal range. There was no stenosis. There was no regurgitation.   ------------------------------------------------------------------- Aorta:  Aortic root: The aortic root was mildly dilated.  ------------------------------------------------------------------- Mitral valve:   Calcified annulus. Mobility was not restricted. Doppler:  Transvalvular velocity was within the normal range. There was no evidence for stenosis. There was no regurgitation.  ------------------------------------------------------------------- Left atrium:  The atrium was normal in  size.  ------------------------------------------------------------------- Right ventricle:  The cavity size was normal. Systolic function was normal.  ------------------------------------------------------------------- Pulmonic valve:    Doppler:  Transvalvular velocity was within the normal range. There was no evidence for stenosis.  ------------------------------------------------------------------- Tricuspid valve:   Structurally normal valve.    Doppler: Transvalvular velocity was within the normal range. There was trivial regurgitation.  ------------------------------------------------------------------- Pulmonary artery:   Systolic pressure was within the normal range.   ------------------------------------------------------------------- Right atrium:  The atrium was normal in size.  ------------------------------------------------------------------- Pericardium:  There was no pericardial effusion.  ------------------------------------------------------------------- Systemic veins: Inferior vena cava: The vessel was normal in size.  ------------------------------------------------------------------- Measurements   Left ventricle  Value        Reference  LV ID, ED, PLAX chordal        (H)     52.3  mm     43 - 52  LV ID, ES, PLAX chordal        (H)     43.2  mm     23 - 38  LV fx shortening, PLAX chordal (L)     17    %      >=29  LV PW thickness, ED                    10.2  mm     ---------  IVS/LV PW ratio, ED                    1.04         <=1.3  LV ejection fraction, 1-p A4C          47    %      ---------  LV e&', lateral                         12.4  cm/s   ---------  LV E/e&', lateral                       4.73         ---------  LV e&', medial                          6.85  cm/s   ---------  LV E/e&', medial                        8.57         ---------  LV e&', average                         9.63  cm/s   ---------  LV E/e&',  average                       6.1          ---------    Ventricular septum                     Value        Reference  IVS thickness, ED                      10.6  mm     ---------    LVOT                                   Value        Reference  LVOT ID, S                             23    mm     ---------  LVOT area                              4.15  cm^2   ---------    Aorta  Value        Reference  Aortic root ID, ED                     38    mm     ---------    Left atrium                            Value        Reference  LA ID, A-P, ES                         38    mm     ---------  LA ID/bsa, A-P                         1.51  cm/m^2 <=2.2  LA volume, ES, 1-p A4C                 45.9  ml     ---------  LA volume/bsa, ES, 1-p A4C             18.3  ml/m^2 ---------    Mitral valve                           Value        Reference  Mitral E-wave peak velocity            58.7  cm/s   ---------  Mitral A-wave peak velocity            67.4  cm/s   ---------  Mitral deceleration time       (H)     313   ms     150 - 230  Mitral E/A ratio, peak                 0.9          ---------    Systemic veins                         Value        Reference  Estimated CVP                          3     mm Hg  ---------    Right ventricle                        Value        Reference  TAPSE                                  15.3  mm     ---------  RV s&', lateral, S                      11.7  cm/s   ---------  Legend: (L)  and  (H)  mark values outside specified reference range.  ------------------------------------------------------------------- Prepared and Electronically Authenticated by  Kirk Ruths 2017-02-12T15:08:13    *[facility]*  [address], [place] [address]                             [phone]  ------------------------------------------------------------------- Noninvasive Vascular Lab  Transcranial Duplex Study  Patient:    Asad, Keeven MR #:       [id number] Study Date: 02/07/2016 Gender:     M Age:        36 Height: Weight: BSA: Pt. Status: Room:       [id number]   ATTENDING    Eliezer Bottom [id number]  SONOGRAPHER  Landry Mellow, RVT, RDMS  Rachel Bo  Teressa Senter  Reports also to:  ------------------------------------------------------------------- History and indications:  Indications  Stroke patient with lower extremity DVT.  ------------------------------------------------------------------- Study information:  Study status:  Routine.  Procedure:  A vascular evaluation was performed. The right ICA, right ophthalmic, right middle cerebral, right anterior cerebral, right posterior cerebral, left ICA, left ophthalmic, left middle cerebral, left anterior cerebral, and left posterior cerebral arteries were studied. Image quality was excellent.    Transcranial duplex study.  Birthdate:  Patient birthdate: 07/30/1951.  Age:  Patient is 67 yr old.  Sex:  Gender: male.  Study date:  Study date: 02/07/2016. Study time: 02:36 PM. Location:  Vascular laboratory.  Patient status:  Inpatient.   ------------------------------------------------------------------- Summary:  - Transcranial Doppler Bubble study.   Performed by Dr. Erlinda Hong.     Verbal consent taken and risks/ benefits explained.     Left forearm IV was used for procedure.   Right Middle Cerebral artery was insonated.     HITS heard at rest: Spencer degree I   HITS heard during valsalva: Spencer degree IV -V     PFO size: small size at rest, large size during valsalva. - Postive Transcranial Doppler study indicative of a large right to   left intracardiac shunt with valsalava manouvre.  Prepared and Electronically  Authenticated by  Antony Contras MD    Aortic Size Index=     4.7    /Body surface area is 2.35 meters squared. =  2  < 2.75 cm/m2      4% risk per year 2.75 to 4.25          8% risk per year > 4.25 cm/m2    20% risk per year     Assessment / Plan:  #1 aortic atherosclerosis 4.7 cm ascending thoracic aortic aneurysm  associated with a trileaflet aortic valve by echocardiogram, without evidence of aortic insufficiency or aortic stenosis. #2 stroke without residual 01/2016  #3 PFO- by transcranial doppler  #4 history of left leg DVT #5 bilateral knee replacements    Patient was warned about not using Cipro and similar antibiotics. Recent studies have raised concern that fluoroquinolone antibiotics could be associated with an increased risk of aortic aneurysm Fluoroquinolones have non-antimicrobial properties that might jeopardise the integrity of the extracellular matrix of the vascular wall In a  propensity score matched cohort study in [place], there was a 66% increased rate of aortic aneurysm or dissection associated with oral fluoroquinolone use, compared with amoxicillin use, within a 60 day risk period from start of treatment  Patient doing well following recent diagnosis of colon cancer stage IIIb,  appears to have stable 4.7 cm ascending aortic aneurysm.  We will plan on follow-up CTA of the chest in 8 months.   Grace Isaac MD      [address] [place] [address] Office [phone]   Beeper [phone]  05/15/2018 3:55 PM

## 2018-05-16 ENCOUNTER — Telehealth: Payer: Self-pay | Admitting: Pharmacy Technician

## 2018-05-16 NOTE — Telephone Encounter (Signed)
Oral Oncology Patient Advocate Encounter  Was successful in securing patient an $ 2800 grant from Patient [facility]) to provide copayment coverage for his Xeloda.  This will keep the out of pocket expense at $0.    I have spoken with the patient.    The billing information is as follows and has been shared with [place].   Member ID: [id number] Group ID: [id number] RxBin: [id number] Dates of Eligibility: 02/13/2018 through 05/14/2019  Christian Weeks. [name], [name] [name] [phone] 05/16/2018 9:44 AM

## 2018-05-17 ENCOUNTER — Other Ambulatory Visit: Payer: Self-pay | Admitting: Hematology

## 2018-05-17 DIAGNOSIS — C187 Malignant neoplasm of sigmoid colon: Secondary | ICD-10-CM

## 2018-05-17 DIAGNOSIS — C772 Secondary and unspecified malignant neoplasm of intra-abdominal lymph nodes: Principal | ICD-10-CM

## 2018-05-24 NOTE — Progress Notes (Signed)
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 Watt Climes -An infiltrative non-obstructing small mass was found in the mid sigmoid colon. Mass was partially circumferential (involving one third of the lumen circumference). The mass measured 2 cm in length, diameter was 2 mm. No bleeding was present.   -external and internal hemorrhoids -diverticulosis in the sigmoid colon, in the descending colon, in the transverse colon, and at the hepatic flexure -one medium polyp in the mid transverse colon  -ten small polyps in the rectum, in the sigmoid colon, in the descending colon, and in the transverse colon -likely malignant tumor in the mid sigmoid colon -exam was otherwise normal       02/26/2018 Initial Biopsy    From colonoscopy: 1. LG intestine- transverse colon, descending, polyp -Tubular adenomas (8). Sessile serrated adenoma/polyp 2. LG intestine - sigmoid colon bx -INVASIVE WELL DIFFERENTIATED ADENOCARCINOMA 3. LG intestine -sigmoid colon, rectum, polyp -Tubular adenoma with high grade dysplasia. Hypoplastic polyps (3).      02/26/2018  Initial Diagnosis    Colon cancer ([place])      02/28/2018 Imaging    CT AP IMPRESSION: -No evidence of metastatic disease or other acute findings. -Colonic diverticulosis, without radiographic evidence of diverticulitis. -Mild hepatic steatosis. -Mildly enlarged prostate.       03/07/2018 Tumor Marker    CEA 1.3 (pre-op)      03/08/2018 Surgery    PROCEDURE:  Procedure(s): LAPAROSCOPIC ASSISTED SIGMOID COLECTOMY ERAS PATHWAY  MOBILIZATION OF SPLENIC FLEXURE RIGID SIGMOIDOSCOPY      03/08/2018 Cancer Staging    Staging form: Colon and Rectum, AJCC 8th Edition - Pathologic stage from 03/08/2018: Stage IIIB (pT3, pN1b, cM0) - Signed by Alla Feeling, NP on 04/05/2018      03/08/2018 Pathology Results    Diagnosis 1. Colon, segmental resection for tumor, sigmoid, stitch marks distal - INVASIVE ADENOCARCINOMA, MODERATELY DIFFERENTIATED, SPANNING 2.4 CM. - TUMOR FOCALLY INVADES THROUGH MUSCULARIS PROPRIA. - RESECTION MARGIN IS NEGATIVE. - METASTATIC CARCINOMA IN TWO OF TWENTY LYMPH NODES (2/20). - DIVERTICULOSIS. - HYPERPLASTIC POLYP (X2). - SEE ONCOLOGY TABLE. -MMR normal       04/09/2018 Imaging    CT Chest WO Contrast 04/09/18 IMPRESSION: 1. Stable exam. Scattered bilateral tiny pulmonary nodules without interval change. No new or progressive pulmonary nodule or mass. Attention on follow-up may be warranted. 2. Ascending thoracic aorta measures up to 4.9 cm maximum diameter, increased from 4.6 cm on prior study. Ascending thoracic aortic aneurysm. Recommend semi-annual imaging followup by CTA or MRA and referral to cardiothoracic surgery if not already obtained. This recommendation follows 2010 ACCF/AHA/AATS/ACR/ASA/SCA/SCAI/SIR/STS/SVM Guidelines for the Diagnosis and Management of Patients  With Thoracic Aortic Disease. Circulation. 2010; 121: S970-Y637      04/13/2018 -  Chemotherapy    Adjuvant chemo CAPOX       HISTORY OF PRESENTING ILLNESS:  Christian Weeks 67  y.o. male is here because of newly diagnosed sigmoid colon adenocarcinoma. He reports infrequent episodes of small bright red blood in stool for several years he attributed to hemorrhoids. He was finally convinced by his PCP Dr. Shelia Media to do Cologuard testing which returned positive. Denies weight loss, decreased appetite, fatigue, change in bowel habits, or abdominal pain. He was referred to Dr. Watt Climes for colonoscopy on 02/26/2018.  This was his first colonoscopy.  External and internal hemorrhoids were found on exam as well as scattered diverticula throughout the colon. Ten polyps were found in the transverse, descending, sigmoid colon and rectum. An infiltrative non--obstructing small mass was found in the mid sigmoid colon, partially circumferential involving one third of the lumen, no bleeding was present. Pathology was positive for invasive well differentiated adenocarcinoma. Staging work up was negative for metastatic disease in the abdomen or pelvis. Previous chest imaging from January and September 2018 showed stable and small pulmonary nodules. He was referred to surgery and underwent laparoscopic sigmoid colectomy per Dr. Marlou Starks on 02/28/18. Pre op CEA was normal at 1.3.   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 Servando Snare. S/p stroke in 01/2016 confirmed by MRI showing 2 separate acute ischemic infarcts. LE doppler showed left LE DVT. Bubble echo showed PFO. Treated with eliquis from 01/2016 - 08/2017. He is widowed, lives alone, has 2 healthy sons. Independent of all ADLs and drives himself. Formerly very active with daily exercise prior to surgery. No family history of cancer. No significant drug or alcohol use, former cigarette smoker quit 11/16/1996 smoked 1.5 PPD x30 years.   Today he presents with his friend. He is recovering well, denies abdominal  pain. Appetite is normal. BM every 1-2 days, no hematochezia. 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  CURRENT THERAPY:  Adjuvant CAPOX every 3 weeks. Oxa;iplatin 171m/m2, 2000 mg Xeloda BID on days 1-14, every 21 days starting 04/13/18. Dose reduced for first cycle. Last treatment 06/15/18  INTERVAL HISTORY:   Christian Weeks here for follow up and cycle 3 CAPOX. He is accompanied by his wife today. He notes with full dose chemo he notes this effected his diet and made him more fatigued. He was able to recover 5 days later. 2 weeks he was fine and 3rd week he feels close to normal. He notes his cold sensitivity lasted longer this time. He notes he plans to take off work right after treatment. He does not thin after his treatment that he received fluids to flush his vein because he had burning sensation in his vein.    On review of symptoms, pt notes constipation. He takes dulcolax for this.     MEDICAL HISTORY:  Past Medical History:  Diagnosis Date  . Arthritis   . Blood clot in vein 01/2016  . Colon cancer ([facility]   . Degenerative joint disease   . History of kidney stones age 67's . Hypertension   . Hypothyroidism   . Mass of colon    sigmoid  . Stroke ([facility] 01/2016   speech difficulty for a few hours after knee replacement left  . Thoracic  ascending aortic aneurysm ([facility])    4.6-4.7 cm followed by Dr Servando Snare- LOV- 08/2017-epic   . Wears dentures   . Wears glasses     SURGICAL HISTORY: Past Surgical History:  Procedure Laterality Date  . colonscopy  02/26/2018  . KNEE ARTHROPLASTY Left 01/15/2016   Procedure: COMPUTER ASSISTED TOTAL KNEE ARTHROPLASTY;  Surgeon: [name], MD;  Location: [facility];  Service: Orthopedics;  Laterality: Left;  . LAPAROSCOPIC SIGMOID COLECTOMY N/A 03/08/2018   Procedure: LAPAROSCOPIC ASSISTED SIGMOID COLECTOMY ERAS PATHWAY;  Surgeon: Jovita Kussmaul, MD;  Location: [facility];  Service: General;  Laterality: N/A;  .  MULTIPLE TOOTH EXTRACTIONS    . TOTAL KNEE ARTHROPLASTY Right 01/13/2017   Procedure: RIGHT TOTAL KNEE ARTHROPLASTY;  Surgeon: [name], MD;  Location: [facility];  Service: Orthopedics;  Laterality: Right;    SOCIAL HISTORY: Social History   Socioeconomic History  . Marital status: Widowed    Spouse name: Not on file  . Number of children: Not on file  . Years of education: Not on file  . Highest education level: Not on file  Occupational History  . Occupation: part time job - delivers car parts  Social Needs  . Financial resource strain: Not on file  . Food insecurity:    Worry: Not on file    Inability: Not on file  . Transportation needs:    Medical: Not on file    Non-medical: Not on file  Tobacco Use  . Smoking status: Former Smoker    Packs/day: 1.50    Years: 30.00    Pack years: 45.00    Types: Cigarettes    Last attempt to quit: 11/16/1996    Years since quitting: 21.5  . Smokeless tobacco: Never Used  . Tobacco comment: quit 25 yrs ago  Substance and Sexual Activity  . Alcohol use: No  . Drug use: No  . Sexual activity: Not on file  Lifestyle  . Physical activity:    Days per week: Not on file    Minutes per session: Not on file  . Stress: Not on file  Relationships  . Social connections:    Talks on phone: Not on file    Gets together: Not on file    Attends religious service: Not on file    Active member of club or organization: Not on file    Attends meetings of clubs or organizations: Not on file    Relationship status: Not on file  . Intimate partner violence:    Fear of current or ex partner: Not on file    Emotionally abused: Not on file    Physically abused: Not on file    Forced sexual activity: Not on file  Other Topics Concern  . Not on file  Social History Narrative  . Not on file    FAMILY HISTORY: Family History  Problem Relation Age of Onset  . Congestive Heart Failure Mother   . Diabetes Mother   . Thyroid disease Sister      ALLERGIES:  is allergic to poison oak extract.  MEDICATIONS:  Current Outpatient Medications  Medication Sig Dispense Refill  . acetaminophen (TYLENOL) 500 MG tablet Take 1,000 mg by mouth every 6 (six) hours as needed for moderate pain or headache.     . capecitabine (XELODA) 500 MG tablet TAKE 4 TABLETS (2,000 MG TOTAL) BY MOUTH 2 TIMES DAILY AFTER A MEAL. TAKE ON DAYS 1-14 OF CHEMOTHERAPY. REPEAT EVERY 21 DAYS. 112 tablet 1  .  Cholecalciferol (VITAMIN D3) 3000 units TABS Take 3,000 Units by mouth daily.    Marland Kitchen levothyroxine (SYNTHROID, LEVOTHROID) 137 MCG tablet Take 137 mcg by mouth daily before breakfast.    . losartan (COZAAR) 100 MG tablet Take 0.5 tablets (50 mg total) by mouth daily. 90 tablet 3  . ondansetron (ZOFRAN) 8 MG tablet Take 1 tablet (8 mg total) by mouth 2 (two) times daily as needed for refractory nausea / vomiting. Start on day 3 after chemotherapy. 30 tablet 1  . prochlorperazine (COMPAZINE) 10 MG tablet Take 1 tablet (10 mg total) by mouth every 6 (six) hours as needed (Nausea or vomiting). 30 tablet 1   No current facility-administered medications for this visit.     REVIEW OF SYSTEMS:    Constitutional: Denies fevers, chills or abnormal night sweats (+) fatigue  Eyes: Denies blurriness of vision, double vision or watery eyes Ears, nose, mouth, throat, and face: Denies mucositis or sore throat Respiratory: Denies cough, dyspnea or wheezes Cardiovascular: Denies palpitation, chest discomfort or lower extremity swelling Gastrointestinal:  Denies nausea, vomiting, heartburn or change in bowel habits (+) constipation Skin: Denies abnormal skin rashes Lymphatics: Denies new lymphadenopathy or easy bruising Neurological:Denies numbness, tingling or new weaknesses Behavioral/Psych: Mood is stable, no new changes  All other systems were reviewed with the patient and are negative.  PHYSICAL EXAMINATION:  ECOG PERFORMANCE STATUS: 0 - Asymptomatic  Vitals:    05/25/18 0940  BP: 114/80  Pulse: (!) 55  Resp: 19  Temp: 98 F (36.7 C)  SpO2: 98%   Filed Weights   05/25/18 0940  Weight: 240 lb 14.4 oz (109.3 kg)    GENERAL:alert, no distress and comfortable SKIN: skin color, texture, turgor are normal, no rashes or significant lesions EYES: normal, conjunctiva are pink and non-injected, sclera clear OROPHARYNX:no exudate, no erythema and lips, buccal mucosa, and tongue normal  LYMPH:  no palpable cervical, supraclavicular, or axillary lymphadenopathy  LUNGS: clear to auscultation with normal breathing effort HEART: regular rate & rhythm and no murmurs and no lower extremity edema ABDOMEN:abdomen soft, non-tender and normal bowel sounds. No hepatomegaly. (+) 2 right abdominal laparoscopic incisions and low abdominal midline incision are healing well; no erythema or drainage  Musculoskeletal:no cyanosis of digits and no clubbing  PSYCH: alert & oriented x 3 with fluent speech NEURO: no focal motor/sensory deficits  LABORATORY DATA:  I have reviewed the data as listed CBC Latest Ref Rng & Units 05/25/2018 05/11/2018 05/04/2018  WBC 4.0 - 10.3 K/uL 6.0 8.3 5.6  Hemoglobin 13.0 - 17.1 g/dL 13.9 14.9 14.3  Hematocrit 38.4 - 49.9 % 39.9 44.0 42.0  Platelets 140 - 400 K/uL 175 137(L) 205   CMP Latest Ref Rng & Units 05/25/2018 05/11/2018 05/04/2018  Glucose 70 - 140 mg/dL 90 99 98  BUN 7 - 26 mg/dL _0 Creatinine 0.70 - 1.30 mg/dL 1.09 1.24 1.05  Sodium 136 - 145 mmol/L 140 138 137  Potassium 3.5 - 5.1 mmol/L 3.5 4.5 4.0  Chloride 98 - 109 mmol/L 110(H) 106 110(H)  CO2 22 - 29 mmol/L 22 25 21(L)  Calcium 8.4 - 10.4 mg/dL 8.9 9.5 8.9  Total Protein 6.4 - 8.3 g/dL 6.8 7.4 6.9  Total Bilirubin 0.2 - 1.2 mg/dL 0.7 0.7 0.6  Alkaline Phos 40 - 150 U/L 111 116 110  AST 5 - 34 U/L _1 ALT 0 - 55 U/L _2 CEA  02/28/18 1.3 baseline  04/13/18: <1  PROCEDURE COLONOSCOPY per Dr. Watt Climes 02/26/18 -An infiltrative non-obstructing small  mass was found in the mid sigmoid colon. Mass was partially circumferential (involving one third of the lumen circumference). The mass measured 2 cm in length, diameter was 2 mm. No bleeding was present.   -external and internal hemorrhoids -diverticulosis in the sigmoid colon, in the descending colon, in the transverse colon, and at the hepatic flexure -one medium polyp in the mid transverse colon  -ten small polyps in the rectum, in the sigmoid colon, in the descending colon, and in the transverse colon -likely malignant tumor in the mid sigmoid colon -exam was otherwise normal   SURGICAL PATHOLOGY  Diagnosis 03/08/18 1. Colon, segmental resection for tumor, sigmoid, stitch marks distal - INVASIVE ADENOCARCINOMA, MODERATELY DIFFERENTIATED, SPANNING 2.4 CM. - TUMOR FOCALLY INVADES THROUGH MUSCULARIS PROPRIA. - RESECTION MARGIN IS NEGATIVE. - METASTATIC CARCINOMA IN TWO OF TWENTY LYMPH NODES (2/20). - DIVERTICULOSIS. - HYPERPLASTIC POLYP (X2). - SEE ONCOLOGY TABLE. 2. Colon, resection margin (donut), sigmoid, proximal margin - BENIGN COLONIC MUCOSA. - NO DYSPLASIA OR MALIGNANCY. 3. Colon, resection margin (donut), distal ring - BENIGN COLONIC MUCOSA. - NO DYSPLASIA OR MALIGNANCY. Microscopic Comment 1. COLON AND RECTUM (INCLUDING TRANS-ANAL RESECTION): Specimen: Sigmoid colon. Procedure: Segmental resection. Tumor site: Sigmoid. Specimen integrity: Intact. Macroscopic intactness of mesorectum: N/A. Macroscopic tumor perforation: Not identified. Invasive tumor: Maximum size: 2.4 cm Histologic type(s): Invasive adenocarcinoma. Histologic grade and differentiation: G2: moderately differentiated/low grade Type of polyp in which invasive carcinoma arose: N/A. Microscopic extension of invasive tumor: Tumor focally invades through muscularis propria. Lymph-Vascular invasion: Present. Peri-neural invasion: Not identified. Tumor deposit(s) (discontinuous extramural extension): Not  identified. Resection margins: Negative. Proximal margin: Negative. Distal margin: Negative. Circumferential (radial) (posterior ascending, posterior descending; lateral and posterior mid-rectum; and entire lower 1/3 rectum): N/A. Mesenteric margin (sigmoid and transverse): Negative. Distance closest margin (if all above margins negative): >3.2 cm distal. Treatment effect (neo-adjuvant therapy): N/A. Additional polyp(s): Hyperplastic polyp (X2) Non-neoplastic findings: Diverticulosis. Lymph nodes: number examined 19; number positive: 2 Pathologic Staging: pT3, pN1b, pMX Ancillary studies: MSI and MMR will be ordered.  MMR - normal   RADIOGRAPHIC STUDIES: I have personally reviewed the radiological images as listed and agreed with the findings in the report.  CT Chest WO Contrast 04/09/18 IMPRESSION: 1. Stable exam. Scattered bilateral tiny pulmonary nodules without interval change. No new or progressive pulmonary nodule or mass. Attention on follow-up may be warranted. 2. Ascending thoracic aorta measures up to 4.9 cm maximum diameter, increased from 4.6 cm on prior study. Ascending thoracic aortic aneurysm. Recommend semi-annual imaging followup by CTA or MRA and referral to cardiothoracic surgery if not already obtained. This recommendation follows 2010  CT AP W Contrast 02/28/18 IMPRESSION: -No evidence of metastatic disease or other acute findings. -Colonic diverticulosis, without radiographic evidence of diverticulitis. -Mild hepatic steatosis. -Mildly enlarged prostate.  Ct Angio Chest Aorta W &/or Wo Contrast  Result Date: 05/15/2018 CLINICAL DATA:  Thoracic aortic aneurysm without rupture. EXAM: CT ANGIOGRAPHY CHEST WITH CONTRAST TECHNIQUE: Multidetector CT imaging of the chest was performed using the standard protocol during bolus administration of intravenous contrast. Multiplanar CT image reconstructions and MIPs were obtained to evaluate the vascular anatomy. CONTRAST:   75 mL of Isovue 370 intravenously. COMPARISON:  CT scan of April 09, 2018 and September 21, 2017. FINDINGS: Cardiovascular: 4.7 cm ascending thoracic aortic aneurysm is noted. No dissection is noted. Great vessels are widely patent. Transverse thoracic aorta measures 3.2 cm. Proximal descending thoracic aorta measures 2.7 cm.  Normal pericardial effusion is noted. Mediastinum/Nodes: No enlarged mediastinal, hilar, or axillary lymph nodes. Thyroid gland, trachea, and esophagus demonstrate no significant findings. Lungs/Pleura: Lungs are clear. No pleural effusion or pneumothorax. Upper Abdomen: No acute abnormality. Musculoskeletal: No chest wall abnormality. No acute or significant osseous findings. Review of the MIP images confirms the above findings. IMPRESSION: 4.7 cm ascending thoracic aortic aneurysm. Ascending thoracic aortic aneurysm. Recommend semi-annual imaging followup by CTA or MRA and referral to cardiothoracic surgery if not already obtained. This recommendation follows 2010 ACCF/AHA/AATS/ACR/ASA/SCA/SCAI/SIR/STS/SVM Guidelines for the Diagnosis and Management of Patients With Thoracic Aortic Disease. Circulation. 2010; 121: Z308-M578. Electronically Signed   By: Marijo Conception, M.D.   On: 05/15/2018 14:40    ASSESSMENT & PLAN: Everado Pillsbury is a 67 y.o. white male with history of intermittent hematochezia for some time attributing to hemorrhoids, positive cologuard test and found to have non-obstructing mid sigmoid mass on colonoscopy    1. Sigmoid colon adenocarcinoma, well differentiated, pT3N1b,M0, stage IIIB, MMR-normal -We previously reviewed his medical records including lab, imaging, and pathology in detail. His colon cancer was completely resected, margins were negative. Given his stage IIIB disease, he has moderately high risk of recurrence.  - I previously discussed standard adjuvant therapy with CAPOX q3 weeks vs FOLFOX q2 weeks for 3 months to reduce his risk of recurrence; he opted  for CAPOX. He is in good physical condition with well controlled co-morbidities, has good social support; he would be a good candidate for chemotherapy. Chemo consent obtained.  -He has started adjuvant Capeox on 4/19, tolerated first cycle very well. Unfortunately due to his misunderstanding, he only took 50% of the Xeloda dose as prescribed. I also slightly decreased his oxaliplatin dose for first cycle. -He tolerated the full dose CAPOX for cycle 2 well, and has recovered very well. -Labs reviewed, and overall WNL. Labs adequate to proceed with cycle 3 CAPOX today at same dose.  -F/u on 6/21 for last cycle chemo   2. Genetics  -given he had more than 10 polyps on colonoscopy, he qualifies for genetics referral. He has 2 children.  -I previously referred him   3. Weight loss -Has lost approximately 9 pounds since surgery. He would like to lose more weight. I previously encouraged him to avoid active weight loss while under chemotherapy; I previously recommended that he eat well, be active, and remain hydrated. He agrees.  -weight stabilized lately  -Recently gaining weight  -stable weight, I encouraged him to continue to eat and drink well.   4. HTN, thoracic aneurysm -BP well controlled on losartan per PCP -Stable 4.6 ascending thoracic aortic aneurysm followed by Dr. Servando Snare, last CTA 08/2017 with recommendation to repeat semi-annually  -His BP is in the low range of normal, I discussed his BP may be low throughout chemo due to decreased po intake. I advised him to check his BP at home and if it is low and he feels dizzy, it is okay for him to hold Losartan.   5. DVT, Stroke -he developed stroke after knee arthroplasty, found to have LE DVT. On eliquis from 01/2016 - 08/2017. No recurrent edema. Followed by PCP and neuro.   PLAN: -Labs reviewed and adequate to proceed with cycle 3 CAPOX today at same dose  -lab, f/u and last cycle CAPOX in 3 weeks    All questions were answered. The  patient knows to call the clinic with any problems, questions or concerns. I spent 20 minutes counseling the patient face  to face. The total time spent in the appointment was 25 minutes and more than 50% was on counseling.  Oneal Deputy, am acting as scribe for Truitt Merle, MD.   I have reviewed the above documentation for accuracy and completeness, and I agree with the above.     Truitt Merle, MD 05/25/2018

## 2018-05-25 ENCOUNTER — Inpatient Hospital Stay: Payer: Medicare HMO | Admitting: Hematology

## 2018-05-25 ENCOUNTER — Inpatient Hospital Stay: Payer: Medicare HMO

## 2018-05-25 ENCOUNTER — Telehealth: Payer: Self-pay | Admitting: Hematology

## 2018-05-25 ENCOUNTER — Encounter: Payer: Self-pay | Admitting: Hematology

## 2018-05-25 VITALS — BP 114/80 | HR 55 | Temp 98.0°F | Resp 19 | Ht 72.0 in | Wt 240.9 lb

## 2018-05-25 DIAGNOSIS — I712 Thoracic aortic aneurysm, without rupture: Secondary | ICD-10-CM | POA: Diagnosis not present

## 2018-05-25 DIAGNOSIS — C187 Malignant neoplasm of sigmoid colon: Secondary | ICD-10-CM

## 2018-05-25 DIAGNOSIS — Z8673 Personal history of transient ischemic attack (TIA), and cerebral infarction without residual deficits: Secondary | ICD-10-CM | POA: Diagnosis not present

## 2018-05-25 DIAGNOSIS — L538 Other specified erythematous conditions: Secondary | ICD-10-CM

## 2018-05-25 DIAGNOSIS — L539 Erythematous condition, unspecified: Secondary | ICD-10-CM | POA: Diagnosis not present

## 2018-05-25 DIAGNOSIS — I82409 Acute embolism and thrombosis of unspecified deep veins of unspecified lower extremity: Secondary | ICD-10-CM

## 2018-05-25 DIAGNOSIS — Z7901 Long term (current) use of anticoagulants: Secondary | ICD-10-CM

## 2018-05-25 DIAGNOSIS — R634 Abnormal weight loss: Secondary | ICD-10-CM | POA: Diagnosis not present

## 2018-05-25 DIAGNOSIS — C772 Secondary and unspecified malignant neoplasm of intra-abdominal lymph nodes: Principal | ICD-10-CM

## 2018-05-25 DIAGNOSIS — R5383 Other fatigue: Secondary | ICD-10-CM | POA: Diagnosis not present

## 2018-05-25 DIAGNOSIS — R69 Illness, unspecified: Secondary | ICD-10-CM | POA: Diagnosis not present

## 2018-05-25 DIAGNOSIS — Z5111 Encounter for antineoplastic chemotherapy: Secondary | ICD-10-CM | POA: Diagnosis not present

## 2018-05-25 DIAGNOSIS — I10 Essential (primary) hypertension: Secondary | ICD-10-CM | POA: Diagnosis not present

## 2018-05-25 DIAGNOSIS — K5900 Constipation, unspecified: Secondary | ICD-10-CM | POA: Diagnosis not present

## 2018-05-25 LAB — CMP (CANCER CENTER ONLY)
ALBUMIN: 3.6 g/dL (ref 3.5–5.0)
ALK PHOS: 111 U/L (ref 40–150)
ALT: 20 U/L (ref 0–55)
AST: 28 U/L (ref 5–34)
Anion gap: 8 (ref 3–11)
BILIRUBIN TOTAL: 0.7 mg/dL (ref 0.2–1.2)
BUN: 9 mg/dL (ref 7–26)
CALCIUM: 8.9 mg/dL (ref 8.4–10.4)
CO2: 22 mmol/L (ref 22–29)
CREATININE: 1.09 mg/dL (ref 0.70–1.30)
Chloride: 110 mmol/L — ABNORMAL HIGH (ref 98–109)
GFR, Est AFR Am: >60 mL/min (ref >60)
GFR, Estimated: >60 mL/min (ref >60)
GLUCOSE: 90 mg/dL (ref 70–140)
Potassium: 3.5 mmol/L (ref 3.5–5.1)
SODIUM: 140 mmol/L (ref 136–145)
TOTAL PROTEIN: 6.8 g/dL (ref 6.4–8.3)

## 2018-05-25 LAB — CBC WITH DIFFERENTIAL (CANCER CENTER ONLY)
Basophils Absolute: 0 10*3/uL (ref 0.0–0.1)
Basophils Relative: 0 %
EOS ABS: 0.3 10*3/uL (ref 0.0–0.5)
EOS PCT: 6 %
HCT: 39.9 % (ref 38.4–49.9)
HEMOGLOBIN: 13.9 g/dL (ref 13.0–17.1)
LYMPHS ABS: 1.3 10*3/uL (ref 0.9–3.3)
Lymphocytes Relative: 22 %
MCH: 31.5 pg (ref 27.2–33.4)
MCHC: 34.8 g/dL (ref 32.0–36.0)
MCV: 90.5 fL (ref 79.3–98.0)
Monocytes Absolute: 0.9 10*3/uL (ref 0.1–0.9)
Monocytes Relative: 16 %
NEUTROS PCT: 56 %
Neutro Abs: 3.4 10*3/uL (ref 1.5–6.5)
PLATELETS: 175 10*3/uL (ref 140–400)
RBC: 4.41 MIL/uL (ref 4.20–5.82)
RDW: 18.2 % — ABNORMAL HIGH (ref 11.0–14.6)
WBC Count: 6 10*3/uL (ref 4.0–10.3)

## 2018-05-25 MED ORDER — OXALIPLATIN CHEMO INJECTION 100 MG/20ML
128.0000 mg/m2 | Freq: Once | INTRAVENOUS | Status: AC
  Administered 2018-05-25: 300 mg via INTRAVENOUS
  Filled 2018-05-25: qty 60

## 2018-05-25 MED ORDER — DEXTROSE 5 % IV SOLN
Freq: Once | INTRAVENOUS | Status: AC
  Administered 2018-05-25: 11:00:00 via INTRAVENOUS

## 2018-05-25 MED ORDER — DEXAMETHASONE SODIUM PHOSPHATE 10 MG/ML IJ SOLN
INTRAMUSCULAR | Status: AC
  Filled 2018-05-25: qty 1

## 2018-05-25 MED ORDER — DEXAMETHASONE SODIUM PHOSPHATE 10 MG/ML IJ SOLN
10.0000 mg | Freq: Once | INTRAMUSCULAR | Status: AC
  Administered 2018-05-25: 10 mg via INTRAVENOUS

## 2018-05-25 MED ORDER — PALONOSETRON HCL INJECTION 0.25 MG/5ML
INTRAVENOUS | Status: AC
  Filled 2018-05-25: qty 5

## 2018-05-25 MED ORDER — PALONOSETRON HCL INJECTION 0.25 MG/5ML
0.2500 mg | Freq: Once | INTRAVENOUS | Status: AC
  Administered 2018-05-25: 0.25 mg via INTRAVENOUS

## 2018-05-25 MED FILL — CAPECITABINE 500 MG TABLET: 500 | 21 days supply | Qty: 112 | Fill #0

## 2018-05-25 NOTE — Patient Instructions (Signed)
[facility] Discharge Instructions for Patients Receiving Chemotherapy  Today you received the following chemotherapy agents: Oxaliplatin.  To help prevent nausea and vomiting after your treatment, we encourage you to take your Compazine as directed. May take Zofran 3 days after Treatment if needed.   If you develop nausea and vomiting that is not controlled by your nausea medication, call the clinic.   BELOW ARE SYMPTOMS THAT SHOULD BE REPORTED IMMEDIATELY:  *FEVER GREATER THAN 100.5 F  *CHILLS WITH OR WITHOUT FEVER  NAUSEA AND VOMITING THAT IS NOT CONTROLLED WITH YOUR NAUSEA MEDICATION  *UNUSUAL SHORTNESS OF BREATH  *UNUSUAL BRUISING OR BLEEDING  TENDERNESS IN MOUTH AND THROAT WITH OR WITHOUT PRESENCE OF ULCERS  *URINARY PROBLEMS  *BOWEL PROBLEMS  UNUSUAL RASH Items with * indicate a potential emergency and should be followed up as soon as possible.  Feel free to call the clinic should you have any questions or concerns. The clinic phone number is [phone].  Please show the CHEMO ALERT CARD at check-in to the Emergency Department and triage nurse.     

## 2018-05-25 NOTE — Telephone Encounter (Signed)
NO los 5/31

## 2018-06-04 ENCOUNTER — Inpatient Hospital Stay: Payer: Medicare HMO | Attending: Nurse Practitioner | Admitting: Genetic Counselor

## 2018-06-04 ENCOUNTER — Inpatient Hospital Stay: Payer: Medicare HMO

## 2018-06-04 ENCOUNTER — Encounter: Payer: Self-pay | Admitting: Genetic Counselor

## 2018-06-04 DIAGNOSIS — C187 Malignant neoplasm of sigmoid colon: Secondary | ICD-10-CM | POA: Diagnosis not present

## 2018-06-04 DIAGNOSIS — C772 Secondary and unspecified malignant neoplasm of intra-abdominal lymph nodes: Secondary | ICD-10-CM | POA: Insufficient documentation

## 2018-06-04 DIAGNOSIS — D126 Benign neoplasm of colon, unspecified: Secondary | ICD-10-CM | POA: Diagnosis not present

## 2018-06-04 DIAGNOSIS — K635 Polyp of colon: Secondary | ICD-10-CM | POA: Insufficient documentation

## 2018-06-04 DIAGNOSIS — Z315 Encounter for genetic counseling: Secondary | ICD-10-CM | POA: Diagnosis not present

## 2018-06-04 DIAGNOSIS — Z79899 Other long term (current) drug therapy: Secondary | ICD-10-CM | POA: Insufficient documentation

## 2018-06-04 DIAGNOSIS — Z5111 Encounter for antineoplastic chemotherapy: Secondary | ICD-10-CM | POA: Insufficient documentation

## 2018-06-04 NOTE — Progress Notes (Signed)
[facility]      Initial Visit   Patient Name: Christian Weeks Patient DOB: 05/27/1951 Patient Age: 67 y.o. Encounter Date: 06/04/2018  Referring Provider: Truitt Merle, MD  Primary Care Provider: Deland Pretty, MD  Reason for Visit: Evaluate for hereditary susceptibility to cancer    Assessment and Plan:  . Christian Weeks history is not highly suggestive of a hereditary predisposition to cancer, but a genetics evaluation is reasonable given his polyp burden.   . Testing is recommended to determine whether he has a pathogenic mutation that will impact his screening and risk-reduction for cancer. A negative result will be reassuring.  . Christian Weeks wished to pursue genetic testing and a blood sample will be sent for analysis of the 83 genes on [facility]'s Multi-Cancer panel (ALK, APC, ATM, AXIN2, BAP1, BARD1, BLM, BMPR1A, BRCA1, BRCA2, BRIP1, CASR, CDC73, CDH1, CDK4, CDKN1B, CDKN1C, CDKN2A, CEBPA, CHEK2, CTNNA1, DICER1, DIS3L2, EGFR, EPCAM, FH, FLCN, GATA2, GPC3, GREM1, HOXB13, HRAS, KIT, MAX, MEN1, MET, MITF, MLH1, MSH2, MSH3, MSH6, MUTYH, NBN, NF1, NF2, NTHL1, PALB2, PDGFRA, PHOX2B, PMS2, POLD1, POLE, POT1, PRKAR1A, PTCH1, PTEN, RAD50, RAD51C, RAD51D, RB1, RECQL4, RET, RUNX1, SDHA, SDHAF2, SDHB, SDHC, SDHD, SMAD4, SMARCA4, SMARCB1, SMARCE1, STK11, SUFU, TERC, TERT, TMEM127, TP53, TSC1, TSC2, VHL, WRN, WT1).   . Results should be available in approximately 2-3 weeks, at which point we will contact him and address implications for him as well as address genetic testing for at-risk family members, if needed.     Dr. Burr Medico was available for questions concerning this case. Total time spent by me in face-to-face counseling was approximately 25 minutes.   _____________________________________________________________________   History of Present Illness: Christian Weeks, a 67 y.o. male, is being seen at the [facility] due to a personal history of  colon cancer and polyps. He presents to clinic today to discuss the possibility of a hereditary predisposition to cancer and discuss whether genetic testing is warranted.  Christian Weeks was diagnosed with colon cancer at the age of 48.  In his transverse colon were 8 sessile serrated adenomas, one tubular adenoma with high-grade dysplasia and 3 hyperplastic polyps. Two hyperplastic polyps were also found in his sigmoid colon.  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 Watt Climes -An infiltrative non-obstructing small mass was found in the mid sigmoid colon. Mass was partially circumferential (involving one third of the lumen circumference). The mass measured 2 cm in length, diameter was 2 mm. No bleeding was present.   -external and internal hemorrhoids -diverticulosis in the sigmoid colon, in the descending colon, in the transverse colon, and at the hepatic flexure -one medium polyp in the mid transverse colon  -ten small polyps in the rectum, in the sigmoid colon, in the descending colon, and in the transverse colon -likely malignant tumor in the mid sigmoid colon -exam was otherwise normal       02/26/2018 Initial Biopsy    From colonoscopy: 1. LG intestine- transverse colon, descending, polyp -Tubular adenomas (8). Sessile serrated adenoma/polyp 2. LG intestine - sigmoid colon bx -INVASIVE WELL DIFFERENTIATED ADENOCARCINOMA 3. LG intestine -sigmoid colon, rectum, polyp -Tubular  adenoma with high grade dysplasia. Hypoplastic polyps (3).      02/26/2018 Initial Diagnosis    Colon cancer ([place])      02/28/2018 Imaging    CT AP IMPRESSION: -No evidence of metastatic disease or other acute  findings. -Colonic diverticulosis, without radiographic evidence of diverticulitis. -Mild hepatic steatosis. -Mildly enlarged prostate.       03/07/2018 Tumor Marker    CEA 1.3 (pre-op)      03/08/2018 Surgery    PROCEDURE:  Procedure(s): LAPAROSCOPIC ASSISTED SIGMOID COLECTOMY ERAS PATHWAY  MOBILIZATION OF SPLENIC FLEXURE RIGID SIGMOIDOSCOPY      03/08/2018 Cancer Staging    Staging form: Colon and Rectum, AJCC 8th Edition - Pathologic stage from 03/08/2018: Stage IIIB (pT3, pN1b, cM0) - Signed by Alla Feeling, NP on 04/05/2018      03/08/2018 Pathology Results    Diagnosis 1. Colon, segmental resection for tumor, sigmoid, stitch marks distal - INVASIVE ADENOCARCINOMA, MODERATELY DIFFERENTIATED, SPANNING 2.4 CM. - TUMOR FOCALLY INVADES THROUGH MUSCULARIS PROPRIA. - RESECTION MARGIN IS NEGATIVE. - METASTATIC CARCINOMA IN TWO OF TWENTY LYMPH NODES (2/20). - DIVERTICULOSIS. - HYPERPLASTIC POLYP (X2). - SEE ONCOLOGY TABLE. -MMR normal       04/09/2018 Imaging    CT Chest WO Contrast 04/09/18 IMPRESSION: 1. Stable exam. Scattered bilateral tiny pulmonary nodules without interval change. No new or progressive pulmonary nodule or mass. Attention on follow-up may be warranted. 2. Ascending thoracic aorta measures up to 4.9 cm maximum diameter, increased from 4.6 cm on prior study. Ascending thoracic aortic aneurysm. Recommend semi-annual imaging followup by CTA or MRA and referral to cardiothoracic surgery if not already obtained. This recommendation follows 2010 ACCF/AHA/AATS/ACR/ASA/SCA/SCAI/SIR/STS/SVM Guidelines for the Diagnosis and Management of Patients With Thoracic Aortic Disease. Circulation. 2010; 121: H852-D782      04/13/2018 -  Chemotherapy    Adjuvant chemo CAPOX        Past Medical History:  Diagnosis Date  . Arthritis   . Blood clot in vein 01/2016  . Colon cancer ([place])   . Colon polyps   . Degenerative joint disease   . History of kidney stones  age 26's  . Hypertension   . Hypothyroidism   . Mass of colon    sigmoid  . Stroke ([place]) 01/2016   speech difficulty for a few hours after knee replacement left  . Thoracic ascending aortic aneurysm ([facility])    4.6-4.7 cm followed by Dr Servando Snare- LOV- 08/2017-epic   . Wears dentures   . Wears glasses     Past Surgical History:  Procedure Laterality Date  . colonscopy  02/26/2018  . KNEE ARTHROPLASTY Left 01/15/2016   Procedure: COMPUTER ASSISTED TOTAL KNEE ARTHROPLASTY;  Surgeon: [name], MD;  Location: [place];  Service: Orthopedics;  Laterality: Left;  . LAPAROSCOPIC SIGMOID COLECTOMY N/A 03/08/2018   Procedure: LAPAROSCOPIC ASSISTED SIGMOID COLECTOMY ERAS PATHWAY;  Surgeon: Jovita Kussmaul, MD;  Location: [facility];  Service: General;  Laterality: N/A;  . MULTIPLE TOOTH EXTRACTIONS    . TOTAL KNEE ARTHROPLASTY Right 01/13/2017   Procedure: RIGHT TOTAL KNEE ARTHROPLASTY;  Surgeon: [name], MD;  Location: [place];  Service: Orthopedics;  Laterality: Right;    Social History   Socioeconomic History  . Marital status: Widowed    Spouse name: Not on file  . Number of children: Not on file  . Years of education: Not on file  . Highest education level: Not on file  Occupational History  . Occupation: part time job -  delivers car parts  Social Needs  . Financial resource strain: Not on file  . Food insecurity:    Worry: Not on file    Inability: Not on file  . Transportation needs:    Medical: Not on file    Non-medical: Not on file  Tobacco Use  . Smoking status: Former Smoker    Packs/day: 1.50    Years: 30.00    Pack years: 45.00    Types: Cigarettes    Last attempt to quit: 11/16/1996    Years since quitting: 21.5  . Smokeless tobacco: Never Used  . Tobacco comment: quit 25 yrs ago  Substance and Sexual Activity  . Alcohol use: No  . Drug use: No  . Sexual activity: Not on file  Lifestyle  . Physical activity:    Days per week: Not on file    Minutes per session:  Not on file  . Stress: Not on file  Relationships  . Social connections:    Talks on phone: Not on file    Gets together: Not on file    Attends religious service: Not on file    Active member of club or organization: Not on file    Attends meetings of clubs or organizations: Not on file    Relationship status: Not on file  Other Topics Concern  . Not on file  Social History Narrative  . Not on file     Family History:  During the visit, a 4-generation pedigree was obtained. Family tree will be scanned in the Media tab in Epic  Christian Weeks has no family history of cancer. There is no information about whether his relatives have colon polyps. He has 2 sons (ages 83 and 73). He has 2 brothers (ages 29 and 22) and 3 sisters (ages 77, 63 and 75). Another sister passed away at 57. His parents passed away in their early 17s. His mother had a sister and 2 brothers (deceased 41s-80s). His father had 2 sisters (deceased 49s and 10).  Christian Weeks ancestry is Caucasian - NOS. There is no known Jewish ancestry and no consanguinity.  Discussion: We reviewed the characteristics, features and inheritance patterns of hereditary cancer syndromes with a focus on genes related to colorectal cancers and polyposis. We discussed his risk of harboring a mutation in the context of his personal and family history. We discussed the process of genetic testing, insurance coverage and implications of results: positive, negative and variant of unknown significance (VUS).    Christian Weeks questions were answered to his satisfaction today and he is welcome to call with any additional questions or concerns. Thank you for the referral and allowing [place] to share in the care of your patient.    Steele Berg, MS, [place] Certified Genetic Counselor phone: [phone] [email or web address]

## 2018-06-10 ENCOUNTER — Encounter: Payer: Self-pay | Admitting: Genetic Counselor

## 2018-06-10 DIAGNOSIS — Z1379 Encounter for other screening for genetic and chromosomal anomalies: Secondary | ICD-10-CM | POA: Insufficient documentation

## 2018-06-10 NOTE — Progress Notes (Signed)
[facility]       Genetic Test Results    Patient Name: Christian Weeks Patient DOB: November 05, 1951 Patient Age: 67 y.o. Encounter Date: 06/11/2018  Referring Provider: Truitt Merle, MD  Primary Care Provider: Deland Pretty, MD   Mr. [name] was called today to discuss genetic test results. Please see the Genetics note from his visit on 06/04/2018 for a detailed discussion of his personal and family history.  Genetic Testing: At the time of Mr. [name]'s visit, he decided to pursue genetic testing of multiple genes associated with hereditary susceptibility to cancer. Testing included sequencing and deletion/duplication analysis. Testing did not reveal a pathogenic mutation in any of the genes analyzed.  A copy of the genetic test report will be scanned into Epic under the Media tab.  The genes analyzed were the 83 genes on [facility]'s Multi-Cancer panel (ALK, APC, ATM, AXIN2, BAP1, BARD1, BLM, BMPR1A, BRCA1, BRCA2, BRIP1, CASR, CDC73, CDH1, CDK4, CDKN1B, CDKN1C, CDKN2A, CEBPA, CHEK2, CTNNA1, DICER1, DIS3L2, EGFR, EPCAM, FH, FLCN, GATA2, GPC3, GREM1, HOXB13, HRAS, KIT, MAX, MEN1, MET, MITF, MLH1, MSH2, MSH3, MSH6, MUTYH, NBN, NF1, NF2, NTHL1, PALB2, PDGFRA, PHOX2B, PMS2, POLD1, POLE, POT1, PRKAR1A, PTCH1, PTEN, RAD50, RAD51C, RAD51D, RB1, RECQL4, RET, RUNX1, SDHA, SDHAF2, SDHB, SDHC, SDHD, SMAD4, SMARCA4, SMARCB1, SMARCE1, STK11, SUFU, TERC, TERT, TMEM127, TP53, TSC1, TSC2, VHL, WRN, WT1).  Since the current test is not perfect, it is possible that there may be a gene mutation that current testing cannot detect, but that chance is small. It is possible that a different genetic factor, which has not yet been discovered or is not on this panel, is responsible for Mr. [name]'s colon cancer and polyps. No additional testing is recommended at this time for Mr. [name].  Cancer Screening: These results suggest that Mr. [name]'s colon cancer and polyps was most likely not due  to an inherited predisposition. He is recommended to follow the cancer screening guidelines provided by his physician.   Family Members: Family members are recommended to speak with their own providers about appropriate cancer screenings.   Any relative who had cancer at a young age or had a particularly rare cancer may also wish to pursue genetic testing. Genetic counselors can be located in other cities, by visiting the website of the [employer] (ArtistMovie.se) and Field seismologist for a Dietitian by zip code.   Lastly, cancer genetics is a rapidly advancing field and it is possible that new genetic tests will be appropriate for Mr. [name] in the future. Mr. [name] is encouraged to remain in contact with Genetics on an annual basis so we can update his personal and family histories, and let him know of advances in cancer genetics that may benefit the family. Mr. [name] questions were answered to his satisfaction today, and he knows he is welcome to call anytime with additional questions.     Steele Berg, MS, [place] Certified Genetic Counselor phone: [phone]

## 2018-06-11 ENCOUNTER — Ambulatory Visit: Payer: Self-pay | Admitting: Genetic Counselor

## 2018-06-11 DIAGNOSIS — Z1379 Encounter for other screening for genetic and chromosomal anomalies: Secondary | ICD-10-CM

## 2018-06-14 NOTE — Progress Notes (Signed)
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 [facility]) Staging form: Colon and Rectum, AJCC 8th Edition - Pathologic stage from 03/08/2018: Stage IIIB (pT3, pN1b, cM0) - Signed by Alla Feeling, NP on 04/05/2018  Oncology History   Cancer Staging Colon cancer [facility]) Staging form: Colon and Rectum, AJCC 8th Edition - Pathologic stage from 03/08/2018: Stage IIIB (pT3, pN1b, cM0) - Signed by Alla Feeling, NP on 04/05/2018       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 Watt Climes -An infiltrative non-obstructing small mass was found in the mid sigmoid colon. Mass was partially circumferential (involving one third of the lumen circumference). The mass measured 2 cm in length, diameter was 2 mm. No bleeding was present.   -external and internal hemorrhoids -diverticulosis in the sigmoid colon, in the descending colon, in the transverse colon, and at the hepatic flexure -one medium polyp in the mid transverse colon  -ten small polyps in the rectum, in the sigmoid colon, in the descending colon, and in the transverse colon -likely malignant tumor in the mid sigmoid colon -exam was otherwise normal       02/26/2018 Initial Biopsy    From colonoscopy: 1. LG intestine- transverse colon, descending, polyp -Tubular adenomas (8). Sessile serrated adenoma/polyp 2. LG intestine - sigmoid colon bx -INVASIVE WELL DIFFERENTIATED ADENOCARCINOMA 3. LG intestine -sigmoid colon, rectum, polyp -Tubular adenoma with high grade dysplasia. Hypoplastic polyps (3).      02/26/2018  Initial Diagnosis    Colon cancer ([place])      02/28/2018 Imaging    CT AP IMPRESSION: -No evidence of metastatic disease or other acute findings. -Colonic diverticulosis, without radiographic evidence of diverticulitis. -Mild hepatic steatosis. -Mildly enlarged prostate.       03/07/2018 Tumor Marker    CEA 1.3 (pre-op)      03/08/2018 Surgery    PROCEDURE:  Procedure(s): LAPAROSCOPIC ASSISTED SIGMOID COLECTOMY ERAS PATHWAY  MOBILIZATION OF SPLENIC FLEXURE RIGID SIGMOIDOSCOPY      03/08/2018 Cancer Staging    Staging form: Colon and Rectum, AJCC 8th Edition - Pathologic stage from 03/08/2018: Stage IIIB (pT3, pN1b, cM0) - Signed by Alla Feeling, NP on 04/05/2018      03/08/2018 Pathology Results    Diagnosis 1. Colon, segmental resection for tumor, sigmoid, stitch marks distal - INVASIVE ADENOCARCINOMA, MODERATELY DIFFERENTIATED, SPANNING 2.4 CM. - TUMOR FOCALLY INVADES THROUGH MUSCULARIS PROPRIA. - RESECTION MARGIN IS NEGATIVE. - METASTATIC CARCINOMA IN TWO OF TWENTY LYMPH NODES (2/20). - DIVERTICULOSIS. - HYPERPLASTIC POLYP (X2). - SEE ONCOLOGY TABLE. -MMR normal       04/09/2018 Imaging    CT Chest WO Contrast 04/09/18 IMPRESSION: 1. Stable exam. Scattered bilateral tiny pulmonary nodules without interval change. No new or progressive pulmonary nodule or mass. Attention on follow-up may be warranted. 2. Ascending thoracic aorta measures up to 4.9 cm maximum diameter, increased from 4.6 cm on prior study. Ascending thoracic aortic aneurysm. Recommend semi-annual imaging followup by CTA or MRA and referral to cardiothoracic surgery if not already obtained. This recommendation follows 2010 ACCF/AHA/AATS/ACR/ASA/SCA/SCAI/SIR/STS/SVM Guidelines for the Diagnosis and Management of Patients  With Thoracic Aortic Disease. Circulation. 2010; 121: T771-H657      04/13/2018 -  Chemotherapy    Adjuvant CAPOX every 3 weeks. Oxaliplatin 191m/m2, 2000 mg Xeloda BID on days  1-14, every 21 days starting 04/13/18. Dose reduced for first cycle. Last oxaliplatin treatment 06/15/18 at reduced dose due to neuropathy and fatigue, complete Xeloda on 07/05/18      06/04/2018 Genetic Testing    06/04/18 Testing did not reveal a pathogenic mutation in any of the genes analyzed. A copy of the genetic test report will be scanned into Epic under the Media tab.  The genes analyzed were the 83 genes on [facility]'s Multi-Cancer panel (ALK, APC, ATM, AXIN2, BAP1, BARD1, BLM, BMPR1A, BRCA1, BRCA2, BRIP1, CASR, CDC73, CDH1, CDK4, CDKN1B, CDKN1C, CDKN2A, CEBPA, CHEK2, CTNNA1, DICER1, DIS3L2, EGFR, EPCAM, FH, FLCN, GATA2, GPC3, GREM1, HOXB13, HRAS, KIT, MAX, MEN1, MET, MITF, MLH1, MSH2, MSH3, MSH6, MUTYH, NBN, NF1, NF2, NTHL1, PALB2, PDGFRA, PHOX2B, PMS2, POLD1, POLE, POT1, PRKAR1A, PTCH1, PTEN, RAD50, RAD51C, RAD51D, RB1, RECQL4, RET, RUNX1, SDHA, SDHAF2, SDHB, SDHC, SDHD, SMAD4, SMARCA4, SMARCB1, SMARCE1, STK11, SUFU, TERC, TERT, TMEM127, TP53, TSC1, TSC2, VHL, WRN, WT1).       HISTORY OF PRESENTING ILLNESS:  Christian STITES67y.o. male is here because of newly diagnosed sigmoid colon adenocarcinoma. He reports infrequent episodes of small bright red blood in stool for several years he attributed to hemorrhoids. He was finally convinced by his PCP Dr. PShelia Mediato do Cologuard testing which returned positive. Denies weight loss, decreased appetite, fatigue, change in bowel habits, or abdominal pain. He was referred to Dr. MWatt Climesfor colonoscopy on 02/26/2018.  This was his first colonoscopy.  External and internal hemorrhoids were found on exam as well as scattered diverticula throughout the colon. Ten polyps were found in the transverse, descending, sigmoid colon and rectum. An infiltrative non--obstructing small mass was found in the mid sigmoid colon, partially circumferential involving one third of the lumen, no bleeding was present. Pathology was positive for invasive well differentiated  adenocarcinoma. Staging work up was negative for metastatic disease in the abdomen or pelvis. Previous chest imaging from January and September 2018 showed stable and small pulmonary nodules. He was referred to surgery and underwent laparoscopic sigmoid colectomy per Dr. TMarlou Starkson 02/28/18. Pre op CEA was normal at 1.3.   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 GServando Snare S/p stroke in 01/2016 confirmed by MRI showing 2 separate acute ischemic infarcts. LE doppler showed left LE DVT. Bubble echo showed PFO. Treated with eliquis from 01/2016 - 08/2017. He is widowed, lives alone, has 2 healthy sons. Independent of all ADLs and drives himself. Formerly very active with daily exercise prior to surgery. No family history of cancer. No significant drug or alcohol use, former cigarette smoker quit 11/16/1996 smoked 1.5 PPD x30 years.   Today he presents with his friend. He is recovering well, denies abdominal pain. Appetite is normal. BM every 1-2 days, no hematochezia. 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 Oxaliplatin 1319mm2, 2000 mg Xeloda BID on days 1-14, every 21 days starting 04/13/18. Dose reduced for first cycle. Reduced oxaliplatin for cycle 4 due to tolerance issue. A total of 4 cycles   INTERVAL HISTORY:   JoTENOCH MCCLUREs here for follow up and cycle 4  Oxaliplatin. He is accompanied by his wife today accompanied by his wife. He notes last cycle chemo made him weaker so he cannot walk as far, half a mile, without taking a break due to SOB. He notes he experiences cold sensitivity during beginning of treatment. He notes some redness and some numbness at tips of fingers. He notes some gum soreness in week 2 treatment. He notes he is taking 2078m BID of Xeloda as  prescribed. He will get refill today.    On review of symptoms, pt notes cold sensitivity, fatigue, SOB upon  Exertion, skin redness of palms, mild neuropathy in fingers, and gum soreness.     MEDICAL HISTORY:  Past Medical History:  Diagnosis Date  . Arthritis   . Blood clot in vein 01/2016  . Colon cancer ([place]   . Colon polyps   . Degenerative joint disease   . Genetic testing 06/11/18   Multi-Cancer panel (83 genes) @ [facility] - No pathogenic mutations detected  . History of kidney stones age 67's . Hypertension   . Hypothyroidism   . Mass of colon    sigmoid  . Stroke ([facility] 01/2016   speech difficulty for a few hours after knee replacement left  . Thoracic ascending aortic aneurysm ([facility])    4.6-4.7 cm followed by Dr GServando Snare LOV- 08/2017-epic   . Wears dentures   . Wears glasses     SURGICAL HISTORY: Past Surgical History:  Procedure Laterality Date  . colonscopy  02/26/2018  . KNEE ARTHROPLASTY Left 01/15/2016   Procedure: COMPUTER ASSISTED TOTAL KNEE ARTHROPLASTY;  Surgeon: MMarybelle Killings MD;  [place] [facility]  Service: Orthopedics;  Laterality: Left;  . LAPAROSCOPIC SIGMOID COLECTOMY N/A 03/08/2018   Procedure: LAPAROSCOPIC ASSISTED SIGMOID COLECTOMY ERAS PATHWAY;  Surgeon: TJovita Kussmaul MD;  Location: [facility];  Service: General;  Laterality: N/A;  . MULTIPLE TOOTH EXTRACTIONS    . TOTAL KNEE ARTHROPLASTY Right 01/13/2017   Procedure: RIGHT TOTAL KNEE ARTHROPLASTY;  Surgeon: MMarybelle Killings MD;  [facility] [facility]  Service: Orthopedics;  Laterality: Right;    SOCIAL HISTORY: Social History   Socioeconomic History  . Marital status: Widowed    Spouse name: Not on file  . Number of children: Not on file  . Years of education: Not on file  . Highest education level: Not on file  Occupational History  . Occupation: part time job - delivers car parts  Social Needs  . Financial resource strain: Not on file  . Food insecurity:    Worry: Not on file    Inability: Not  on file  . Transportation needs:    Medical: Not on file    Non-medical: Not on file  Tobacco Use  . Smoking status: Former Smoker    Packs/day: 1.50    Years: 30.00    Pack years: 45.00    Types: Cigarettes    Last attempt to quit: 11/16/1996    Years since quitting: 21.5  . Smokeless tobacco: Never Used  . Tobacco comment: quit 25 yrs ago  Substance and Sexual Activity  . Alcohol use: No  . Drug use: No  . Sexual activity: Not on file  Lifestyle  . Physical activity:    Days per week: Not on file    Minutes per session: Not on file  . Stress: Not on file  Relationships  . Social connections:    Talks on phone: Not on file    Gets together: Not on file  Attends religious service: Not on file    Active member of club or organization: Not on file    Attends meetings of clubs or organizations: Not on file    Relationship status: Not on file  . Intimate partner violence:    Fear of current or ex partner: Not on file    Emotionally abused: Not on file    Physically abused: Not on file    Forced sexual activity: Not on file  Other Topics Concern  . Not on file  Social History Narrative  . Not on file    FAMILY HISTORY: Family History  Problem Relation Age of Onset  . Congestive Heart Failure Mother   . Diabetes Mother   . Thyroid disease Sister     ALLERGIES:  is allergic to poison oak extract.  MEDICATIONS:  Current Outpatient Medications  Medication Sig Dispense Refill  . acetaminophen (TYLENOL) 500 MG tablet Take 1,000 mg by mouth every 6 (six) hours as needed for moderate pain or headache.     . capecitabine (XELODA) 500 MG tablet TAKE 4 TABLETS (2,000 MG TOTAL) BY MOUTH 2 TIMES DAILY AFTER A MEAL. TAKE ON DAYS 1-14 OF CHEMOTHERAPY. REPEAT EVERY 21 DAYS. 112 tablet 1  . Cholecalciferol (VITAMIN D3) 3000 units TABS Take 3,000 Units by mouth daily.    Marland Kitchen levothyroxine (SYNTHROID, LEVOTHROID) 137 MCG tablet Take 137 mcg by mouth daily before breakfast.    .  losartan (COZAAR) 100 MG tablet Take 0.5 tablets (50 mg total) by mouth daily. 90 tablet 3  . ondansetron (ZOFRAN) 8 MG tablet Take 1 tablet (8 mg total) by mouth 2 (two) times daily as needed for refractory nausea / vomiting. Start on day 3 after chemotherapy. 30 tablet 1  . prochlorperazine (COMPAZINE) 10 MG tablet Take 1 tablet (10 mg total) by mouth every 6 (six) hours as needed (Nausea or vomiting). 30 tablet 1   No current facility-administered medications for this visit.    Facility-Administered Medications Ordered in Other Visits  Medication Dose Route Frequency Provider Last Rate Last Dose  . oxaliplatin (ELOXATIN) 250 mg in dextrose 5 % 500 mL chemo infusion  106 mg/m2 (Treatment Plan Recorded) Intravenous Once Truitt Merle, MD 275 mL/hr at 06/15/18 1154 250 mg at 06/15/18 1154   REVIEW OF SYSTEMS:   Constitutional: Denies fevers, chills or abnormal night sweats (+) fatigue, low energy Eyes: Denies blurriness of vision, double vision or watery eyes Ears, nose, mouth, throat, and face: Denies mucositis or sore throat (+) gum soreness Respiratory: Denies cough, dyspnea or wheezes (+) SOB upon exertion  Cardiovascular: Denies palpitation, chest discomfort or lower extremity swelling Gastrointestinal:  Denies nausea, vomiting, heartburn or change in bowel habits  Skin: Denies abnormal skin rashes (+) redness of palms Lymphatics: Denies new lymphadenopathy or easy bruising Neurological:Denies numbness (+) cold sensitivity (+) mild neuropathy in hands Behavioral/Psych: Mood is stable, no new changes  All other systems were reviewed with the patient and are negative.  PHYSICAL EXAMINATION:  ECOG PERFORMANCE STATUS: 1  Vitals:   06/15/18 0925  BP: 109/80  Pulse: (!) 50  Resp: 18  Temp: 97.8 F (36.6 C)  SpO2: 99%   Filed Weights   06/15/18 0925  Weight: 239 lb 14.4 oz (108.8 kg)    GENERAL:alert, no distress and comfortable SKIN: skin color, texture, turgor are normal, no  rashes or significant lesions EYES: normal, conjunctiva are pink and non-injected, sclera clear OROPHARYNX:no exudate, no erythema and lips, buccal mucosa, and tongue normal  LYMPH:  no palpable cervical, supraclavicular, or axillary lymphadenopathy  LUNGS: clear to auscultation with normal breathing effort HEART: regular rate & rhythm and no murmurs and no lower extremity edema ABDOMEN:abdomen soft, non-tender and normal bowel sounds. No hepatomegaly. (+) 2 right abdominal laparoscopic incisions and low abdominal midline incision are healing well; no erythema or drainage  Musculoskeletal:no cyanosis of digits and no clubbing  PSYCH: alert & oriented x 3 with fluent speech NEURO: no focal motor/sensory deficits  LABORATORY DATA:  I have reviewed the data as listed CBC Latest Ref Rng & Units 06/15/2018 05/25/2018 05/11/2018  WBC 4.0 - 10.3 K/uL 5.0 6.0 8.3  Hemoglobin 13.0 - 17.1 g/dL 13.2 13.9 14.9  Hematocrit 38.4 - 49.9 % 38.0(L) 39.9 44.0  Platelets 140 - 400 K/uL 133(L) 175 137(L)   CMP Latest Ref Rng & Units 06/15/2018 05/25/2018 05/11/2018  Glucose 70 - 140 mg/dL 95 90 99  BUN 7 - 26 mg/dL '9 9 15  ' Creatinine 0.70 - 1.30 mg/dL 1.02 1.09 1.24  Sodium 136 - 145 mmol/L 139 140 138  Potassium 3.5 - 5.1 mmol/L 3.4(L) 3.5 4.5  Chloride 98 - 109 mmol/L 110(H) 110(H) 106  CO2 22 - 29 mmol/L '22 22 25  ' Calcium 8.4 - 10.4 mg/dL 8.9 8.9 9.5  Total Protein 6.4 - 8.3 g/dL 6.6 6.8 7.4  Total Bilirubin 0.2 - 1.2 mg/dL 0.7 0.7 0.7  Alkaline Phos 40 - 150 U/L 140 111 116  AST 5 - 34 U/L 37(H) 28 31  ALT 0 - 55 U/L '18 20 24   ' CEA  02/28/18 1.3 baseline  04/13/18: <1   PROCEDURE COLONOSCOPY per Dr. Watt Climes 02/26/18 -An infiltrative non-obstructing small mass was found in the mid sigmoid colon. Mass was partially circumferential (involving one third of the lumen circumference). The mass measured 2 cm in length, diameter was 2 mm. No bleeding was present.   -external and internal  hemorrhoids -diverticulosis in the sigmoid colon, in the descending colon, in the transverse colon, and at the hepatic flexure -one medium polyp in the mid transverse colon  -ten small polyps in the rectum, in the sigmoid colon, in the descending colon, and in the transverse colon -likely malignant tumor in the mid sigmoid colon -exam was otherwise normal   SURGICAL PATHOLOGY  Diagnosis 03/08/18 1. Colon, segmental resection for tumor, sigmoid, stitch marks distal - INVASIVE ADENOCARCINOMA, MODERATELY DIFFERENTIATED, SPANNING 2.4 CM. - TUMOR FOCALLY INVADES THROUGH MUSCULARIS PROPRIA. - RESECTION MARGIN IS NEGATIVE. - METASTATIC CARCINOMA IN TWO OF TWENTY LYMPH NODES (2/20). - DIVERTICULOSIS. - HYPERPLASTIC POLYP (X2). - SEE ONCOLOGY TABLE. 2. Colon, resection margin (donut), sigmoid, proximal margin - BENIGN COLONIC MUCOSA. - NO DYSPLASIA OR MALIGNANCY. 3. Colon, resection margin (donut), distal ring - BENIGN COLONIC MUCOSA. - NO DYSPLASIA OR MALIGNANCY. Microscopic Comment 1. COLON AND RECTUM (INCLUDING TRANS-ANAL RESECTION): Specimen: Sigmoid colon. Procedure: Segmental resection. Tumor site: Sigmoid. Specimen integrity: Intact. Macroscopic intactness of mesorectum: N/A. Macroscopic tumor perforation: Not identified. Invasive tumor: Maximum size: 2.4 cm Histologic type(s): Invasive adenocarcinoma. Histologic grade and differentiation: G2: moderately differentiated/low grade Type of polyp in which invasive carcinoma arose: N/A. Microscopic extension of invasive tumor: Tumor focally invades through muscularis propria. Lymph-Vascular invasion: Present. Peri-neural invasion: Not identified. Tumor deposit(s) (discontinuous extramural extension): Not identified. Resection margins: Negative. Proximal margin: Negative. Distal margin: Negative. Circumferential (radial) (posterior ascending, posterior descending; lateral and posterior mid-rectum; and entire lower 1/3 rectum):  N/A. Mesenteric margin (sigmoid and transverse): Negative. Distance closest margin (if all above  margins negative): >3.2 cm distal. Treatment effect (neo-adjuvant therapy): N/A. Additional polyp(s): Hyperplastic polyp (X2) Non-neoplastic findings: Diverticulosis. Lymph nodes: number examined 19; number positive: 2 Pathologic Staging: pT3, pN1b, pMX Ancillary studies: MSI and MMR will be ordered.  MMR - normal    06/04/18 Genetics     RADIOGRAPHIC STUDIES: I have personally reviewed the radiological images as listed and agreed with the findings in the report.  CT Chest WO Contrast 04/09/18 IMPRESSION: 1. Stable exam. Scattered bilateral tiny pulmonary nodules without interval change. No new or progressive pulmonary nodule or mass. Attention on follow-up may be warranted. 2. Ascending thoracic aorta measures up to 4.9 cm maximum diameter, increased from 4.6 cm on prior study. Ascending thoracic aortic aneurysm. Recommend semi-annual imaging followup by CTA or MRA and referral to cardiothoracic surgery if not already obtained. This recommendation follows 2010  CT AP W Contrast 02/28/18 IMPRESSION: -No evidence of metastatic disease or other acute findings. -Colonic diverticulosis, without radiographic evidence of diverticulitis. -Mild hepatic steatosis. -Mildly enlarged prostate.  No results found.  ASSESSMENT & PLAN: Samaj Wessells is a 67 y.o. white male with history of intermittent hematochezia for some time attributing to hemorrhoids, positive cologuard test and found to have non-obstructing mid sigmoid mass on colonoscopy    1. Sigmoid colon adenocarcinoma, well differentiated, pT3N1b,M0, stage IIIB, MMR-normal -We previously reviewed his medical records including lab, imaging, and pathology in detail. His colon cancer was completely resected, margins were negative. Given his stage IIIB disease, he has moderately high risk of recurrence.  - I previously discussed standard  adjuvant therapy with CAPOX q3 weeks vs FOLFOX q2 weeks for 3 months to reduce his risk of recurrence; he opted for CAPOX. He is in good physical condition with well controlled co-morbidities, has good social support; he would be a good candidate for chemotherapy. He agreed to proceed.  -He has started adjuvant Capox on 04/13/18. Unfortunately due to his misunderstanding, he only took 50% of the Xeloda dose as prescribed. I also slightly decreased his oxaliplatin dose for first cycle. Due to fatigue and grade I neuropathy I reduced oxaliplatin for cycle 4. Plan to completed CAPOX on 07/05/18.  -I recommend he remain active to combat fatigue and as he recovered post treatment he should gradually increase his activity back to baseline.  -Labs reviewed and adequate to proceed with last cycle Oxaliplatin  today.  -I discussed the surveillance plan, which is a physical exam and lab test (including CBC, CMP and CEA) every 3-4 months for the first 2 years, then every 6-12 months, colonoscopy in one year, and surveillance CT scan every 6-12 month for up to 5 year.  -F/u in 6 weeks    2. Genetics  -given he had more than 10 polyps on colonoscopy, he qualifies for genetics referral. He has 2 children.  -He was referred, his 06/04/18 genetic testing was negative for mutations. I recommend his children have age appropriate colonoscopies.   3. Weight loss -He previously had weight loss after surgery. He would like to lose more weight. I previously encouraged him to avoid active weight loss while under chemotherapy; I previously recommended that he eat well, be active, and remain hydrated. He agrees.  -stable weight, I encouraged him to continue to eat and drink well.   4. HTN, thoracic aneurysm -BP well controlled on losartan per PCP -Stable 4.6 ascending thoracic aortic aneurysm followed by Dr. Servando Snare, last CTA 08/2017 with recommendation to repeat semi-annually  -His BP is in the low range  of normal, I  previously discussed his BP may be low throughout chemo due to decreased po intake.  -I previously advised him to check his BP at home and if it is low and he feels dizzy, it is okay for him to hold Losartan.  -BP at 109/80 today (06/15/18)  5. DVT, Stroke -he developed stroke after knee arthroplasty, found to have LE DVT. On eliquis from 01/2016 - 08/2017. No recurrent edema. Followed by PCP and neuro.  -I encouraged him to remain active as chemotherapy can increase his risk for blood clots.  6. Fatigue and dyspnea on exertion -Secondary to chemotherapy.  He is still able to function at home, he has reduced his work hours -I encouraged him to stay active, we discussed the recovery from chemotherapy may take a few months.  PLAN: -Labs reviewed and adequate to proceed with last cycle oxaliplatin and Xeloda today, with oxaliplatin dose reduced to 110 mg/m, plan to complete Xeloda on 06/29/2018   -Lab and f/u in 6 weeks    All questions were answered. The patient knows to call the clinic with any problems, questions or concerns. I spent 20 minutes counseling the patient face to face. The total time spent in the appointment was 25 minutes and more than 50% was on counseling.  Oneal Deputy, am acting as scribe for Truitt Merle, MD.   I have reviewed the above documentation for accuracy and completeness, and I agree with the above.      Truitt Merle, MD 06/15/2018

## 2018-06-15 ENCOUNTER — Other Ambulatory Visit: Payer: Self-pay | Admitting: Pharmacist

## 2018-06-15 ENCOUNTER — Telehealth: Payer: Self-pay | Admitting: Hematology

## 2018-06-15 ENCOUNTER — Inpatient Hospital Stay: Payer: Medicare HMO

## 2018-06-15 ENCOUNTER — Inpatient Hospital Stay: Payer: Medicare HMO | Admitting: Hematology

## 2018-06-15 ENCOUNTER — Encounter: Payer: Self-pay | Admitting: Hematology

## 2018-06-15 ENCOUNTER — Telehealth: Payer: Self-pay

## 2018-06-15 VITALS — BP 109/80 | HR 50 | Temp 97.8°F | Resp 18 | Ht 72.0 in | Wt 239.9 lb

## 2018-06-15 DIAGNOSIS — R69 Illness, unspecified: Secondary | ICD-10-CM | POA: Diagnosis not present

## 2018-06-15 DIAGNOSIS — Z86718 Personal history of other venous thrombosis and embolism: Secondary | ICD-10-CM | POA: Diagnosis not present

## 2018-06-15 DIAGNOSIS — R0609 Other forms of dyspnea: Secondary | ICD-10-CM

## 2018-06-15 DIAGNOSIS — Z8673 Personal history of transient ischemic attack (TIA), and cerebral infarction without residual deficits: Secondary | ICD-10-CM

## 2018-06-15 DIAGNOSIS — R530 Neoplastic (malignant) related fatigue: Secondary | ICD-10-CM

## 2018-06-15 DIAGNOSIS — C187 Malignant neoplasm of sigmoid colon: Secondary | ICD-10-CM

## 2018-06-15 DIAGNOSIS — C772 Secondary and unspecified malignant neoplasm of intra-abdominal lymph nodes: Principal | ICD-10-CM

## 2018-06-15 DIAGNOSIS — Z5111 Encounter for antineoplastic chemotherapy: Secondary | ICD-10-CM | POA: Diagnosis present

## 2018-06-15 DIAGNOSIS — I10 Essential (primary) hypertension: Secondary | ICD-10-CM | POA: Diagnosis not present

## 2018-06-15 DIAGNOSIS — Z79899 Other long term (current) drug therapy: Secondary | ICD-10-CM | POA: Diagnosis not present

## 2018-06-15 DIAGNOSIS — Z87891 Personal history of nicotine dependence: Secondary | ICD-10-CM

## 2018-06-15 LAB — CMP (CANCER CENTER ONLY)
ALBUMIN: 3.4 g/dL — AB (ref 3.5–5.0)
ALK PHOS: 140 U/L (ref 40–150)
ALT: 18 U/L (ref 0–55)
ANION GAP: 7 (ref 3–11)
AST: 37 U/L — ABNORMAL HIGH (ref 5–34)
BILIRUBIN TOTAL: 0.7 mg/dL (ref 0.2–1.2)
BUN: 9 mg/dL (ref 7–26)
CALCIUM: 8.9 mg/dL (ref 8.4–10.4)
CO2: 22 mmol/L (ref 22–29)
Chloride: 110 mmol/L — ABNORMAL HIGH (ref 98–109)
Creatinine: 1.02 mg/dL (ref 0.70–1.30)
GFR, Estimated: >60 mL/min (ref >60)
GLUCOSE: 95 mg/dL (ref 70–140)
POTASSIUM: 3.4 mmol/L — AB (ref 3.5–5.1)
SODIUM: 139 mmol/L (ref 136–145)
TOTAL PROTEIN: 6.6 g/dL (ref 6.4–8.3)

## 2018-06-15 LAB — CBC WITH DIFFERENTIAL (CANCER CENTER ONLY)
BASOS ABS: 0 10*3/uL (ref 0.0–0.1)
BASOS PCT: 1 %
Eosinophils Absolute: 0.3 10*3/uL (ref 0.0–0.5)
Eosinophils Relative: 6 %
HEMATOCRIT: 38 % — AB (ref 38.4–49.9)
HEMOGLOBIN: 13.2 g/dL (ref 13.0–17.1)
LYMPHS PCT: 25 %
Lymphs Abs: 1.3 10*3/uL (ref 0.9–3.3)
MCH: 32.1 pg (ref 27.2–33.4)
MCHC: 34.7 g/dL (ref 32.0–36.0)
MCV: 92.5 fL (ref 79.3–98.0)
Monocytes Absolute: 0.9 10*3/uL (ref 0.1–0.9)
Monocytes Relative: 18 %
NEUTROS ABS: 2.5 10*3/uL (ref 1.5–6.5)
NEUTROS PCT: 50 %
Platelet Count: 133 10*3/uL — ABNORMAL LOW (ref 140–400)
RBC: 4.11 MIL/uL — ABNORMAL LOW (ref 4.20–5.82)
RDW: 19.4 % — ABNORMAL HIGH (ref 11.0–14.6)
WBC Count: 5 10*3/uL (ref 4.0–10.3)

## 2018-06-15 MED ORDER — PALONOSETRON HCL INJECTION 0.25 MG/5ML
0.2500 mg | Freq: Once | INTRAVENOUS | Status: AC
  Administered 2018-06-15: 0.25 mg via INTRAVENOUS

## 2018-06-15 MED ORDER — DEXAMETHASONE SODIUM PHOSPHATE 10 MG/ML IJ SOLN
INTRAMUSCULAR | Status: AC
  Filled 2018-06-15: qty 1

## 2018-06-15 MED ORDER — DEXTROSE 5 % IV SOLN
Freq: Once | INTRAVENOUS | Status: AC
  Administered 2018-06-15: 11:00:00 via INTRAVENOUS

## 2018-06-15 MED ORDER — PALONOSETRON HCL INJECTION 0.25 MG/5ML
INTRAVENOUS | Status: AC
  Filled 2018-06-15: qty 5

## 2018-06-15 MED ORDER — DEXAMETHASONE SODIUM PHOSPHATE 10 MG/ML IJ SOLN
10.0000 mg | Freq: Once | INTRAMUSCULAR | Status: AC
  Administered 2018-06-15: 10 mg via INTRAVENOUS

## 2018-06-15 MED ORDER — OXALIPLATIN CHEMO INJECTION 100 MG/20ML
106.0000 mg/m2 | Freq: Once | INTRAVENOUS | Status: AC
  Administered 2018-06-15: 250 mg via INTRAVENOUS
  Filled 2018-06-15: qty 40

## 2018-06-15 MED FILL — CAPECITABINE 500 MG TABLET: 500 | 21 days supply | Qty: 112 | Fill #1

## 2018-06-15 NOTE — Telephone Encounter (Signed)
Scheduled appt per 6/21 los - gave patient aVS and calender per los.

## 2018-06-15 NOTE — Patient Instructions (Signed)
[facility] Discharge Instructions for Patients Receiving Chemotherapy  Today you received the following chemotherapy agents: Oxaliplatin.  To help prevent nausea and vomiting after your treatment, we encourage you to take your Compazine as directed. May take Zofran 3 days after Treatment if needed.   If you develop nausea and vomiting that is not controlled by your nausea medication, call the clinic.   BELOW ARE SYMPTOMS THAT SHOULD BE REPORTED IMMEDIATELY:  *FEVER GREATER THAN 100.5 F  *CHILLS WITH OR WITHOUT FEVER  NAUSEA AND VOMITING THAT IS NOT CONTROLLED WITH YOUR NAUSEA MEDICATION  *UNUSUAL SHORTNESS OF BREATH  *UNUSUAL BRUISING OR BLEEDING  TENDERNESS IN MOUTH AND THROAT WITH OR WITHOUT PRESENCE OF ULCERS  *URINARY PROBLEMS  *BOWEL PROBLEMS  UNUSUAL RASH Items with * indicate a potential emergency and should be followed up as soon as possible.  Feel free to call the clinic should you have any questions or concerns. The clinic phone number is [phone].  Please show the [facility] at check-in to the Emergency Department and triage nurse.

## 2018-06-15 NOTE — Telephone Encounter (Signed)
error 

## 2018-07-13 ENCOUNTER — Encounter: Payer: Self-pay | Admitting: *Deleted

## 2018-07-13 NOTE — Progress Notes (Signed)
07/13/18 at 10:24am- [id number] 3 month Follow Up - The research attempted to reach the pt by phone to inquire if he has received his month 3 questionnaire in the mail.  The pt was not available by phone.  The research nurse left a voice message asking the pt to return the nurse's call at his convenience.  The research nurse reviewed the pt's medical record.  The pt has been receiving adjuvant CAPOX every 3 weeks.  The pt was also recently seen by Dr. Burr Medico on 06/15/18.  The pt's ECOG was determined to be 1.  Dr. Ernestina Penna note states the pt has reduced his work hours due to chemo fatigue.  The pt began his chemo on 04/13/18.  The research nurse will await the pt's return call. Brion Aliment RN, BSN, [place] Clinical Research Nurse 07/13/2018 10:35 AM   07/17/18 at 1:13pm - The research nurse attempted to reach the pt by phone again.  The nurse left a detailed message asking the pt to return the nurse's call.  Will await pt's return call. Brion Aliment RN, BSN, CCRP Clinical Research Nurse 07/17/2018 1:14 PM   08/13/18 at 2:07pm - The pt never returned the nurse's call.  The research nurse will use the pt's information in the EMR for the 3 month follow up visit.  Brion Aliment RN, BSN, CCRP Clinical Research Nurse 08/13/2018 2:08 PM

## 2018-07-25 NOTE — Progress Notes (Signed)
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 Watt Climes -An infiltrative non-obstructing small mass was found in the mid sigmoid colon. Mass was partially circumferential (involving one third of the lumen circumference). The mass measured 2 cm in length, diameter was 2 mm. No bleeding was present.   -external and internal hemorrhoids -diverticulosis in the sigmoid colon, in the descending colon, in the transverse colon, and at the hepatic flexure -one medium polyp in the mid transverse colon  -ten small polyps in the rectum, in the sigmoid colon, in the descending colon, and in the transverse colon -likely malignant tumor in the mid sigmoid colon -exam was otherwise normal       02/26/2018 Initial Biopsy    From colonoscopy: 1. LG intestine- transverse colon, descending, polyp -Tubular adenomas (8). Sessile serrated adenoma/polyp 2. LG intestine - sigmoid colon bx -INVASIVE WELL DIFFERENTIATED ADENOCARCINOMA 3. LG intestine -sigmoid colon, rectum, polyp -Tubular adenoma with high grade dysplasia. Hypoplastic polyps (3).      02/26/2018  Initial Diagnosis    Colon cancer ([place])      02/28/2018 Imaging    CT AP IMPRESSION: -No evidence of metastatic disease or other acute findings. -Colonic diverticulosis, without radiographic evidence of diverticulitis. -Mild hepatic steatosis. -Mildly enlarged prostate.       03/07/2018 Tumor Marker    CEA 1.3 (pre-op)      03/08/2018 Surgery    PROCEDURE:  Procedure(s): LAPAROSCOPIC ASSISTED SIGMOID COLECTOMY ERAS PATHWAY  MOBILIZATION OF SPLENIC FLEXURE RIGID SIGMOIDOSCOPY      03/08/2018 Cancer Staging    Staging form: Colon and Rectum, AJCC 8th Edition - Pathologic stage from 03/08/2018: Stage IIIB (pT3, pN1b, cM0) - Signed by Alla Feeling, NP on 04/05/2018      03/08/2018 Pathology Results    Diagnosis 1. Colon, segmental resection for tumor, sigmoid, stitch marks distal - INVASIVE ADENOCARCINOMA, MODERATELY DIFFERENTIATED, SPANNING 2.4 CM. - TUMOR FOCALLY INVADES THROUGH MUSCULARIS PROPRIA. - RESECTION MARGIN IS NEGATIVE. - METASTATIC CARCINOMA IN TWO OF TWENTY LYMPH NODES (2/20). - DIVERTICULOSIS. - HYPERPLASTIC POLYP (X2). - SEE ONCOLOGY TABLE. -MMR normal       04/09/2018 Imaging    CT Chest WO Contrast 04/09/18 IMPRESSION: 1. Stable exam. Scattered bilateral tiny pulmonary nodules without interval change. No new or progressive pulmonary nodule or mass. Attention on follow-up may be warranted. 2. Ascending thoracic aorta measures up to 4.9 cm maximum diameter, increased from 4.6 cm on prior study. Ascending thoracic aortic aneurysm. Recommend semi-annual imaging followup by CTA or MRA and referral to cardiothoracic surgery if not already obtained. This recommendation follows 2010 ACCF/AHA/AATS/ACR/ASA/SCA/SCAI/SIR/STS/SVM Guidelines for the Diagnosis and Management of Patients  With Thoracic Aortic Disease. Circulation. 2010; 121: W431-V400      04/13/2018 - 07/05/2018 Chemotherapy    Adjuvant CAPOX every 3 weeks. Oxaliplatin 150m/m2, 2000 mg Xeloda BID on  days 1-14, every 21 days starting 04/13/18. Dose reduced for first cycle. Last oxaliplatin treatment 06/15/18 at reduced dose due to neuropathy and fatigue, complete Xeloda on 06/29/18      06/04/2018 Genetic Testing    06/04/18 Testing did not reveal a pathogenic mutation in any of the genes analyzed. A copy of the genetic test report will be scanned into Epic under the Media tab.  The genes analyzed were the 83 genes on [facility]'s Multi-Cancer panel (ALK, APC, ATM, AXIN2, BAP1, BARD1, BLM, BMPR1A, BRCA1, BRCA2, BRIP1, CASR, CDC73, CDH1, CDK4, CDKN1B, CDKN1C, CDKN2A, CEBPA, CHEK2, CTNNA1, DICER1, DIS3L2, EGFR, EPCAM, FH, FLCN, GATA2, GPC3, GREM1, HOXB13, HRAS, KIT, MAX, MEN1, MET, MITF, MLH1, MSH2, MSH3, MSH6, MUTYH, NBN, NF1, NF2, NTHL1, PALB2, PDGFRA, PHOX2B, PMS2, POLD1, POLE, POT1, PRKAR1A, PTCH1, PTEN, RAD50, RAD51C, RAD51D, RB1, RECQL4, RET, RUNX1, SDHA, SDHAF2, SDHB, SDHC, SDHD, SMAD4, SMARCA4, SMARCB1, SMARCE1, STK11, SUFU, TERC, TERT, TMEM127, TP53, TSC1, TSC2, VHL, WRN, WT1).       HISTORY OF PRESENTING ILLNESS:  Christian ALESI89y.o. male is here because of newly diagnosed sigmoid colon adenocarcinoma. He reports infrequent episodes of small bright red blood in stool for several years he attributed to hemorrhoids. He was finally convinced by his PCP Dr. PShelia Mediato do Cologuard testing which returned positive. Denies weight loss, decreased appetite, fatigue, change in bowel habits, or abdominal pain. He was referred to Dr. MWatt Climesfor colonoscopy on 02/26/2018.  This was his first colonoscopy.  External and internal hemorrhoids were found on exam as well as scattered diverticula throughout the colon. Ten polyps were found in the transverse, descending, sigmoid colon and rectum. An infiltrative non--obstructing small mass was found in the mid sigmoid colon, partially circumferential involving one third of the lumen, no bleeding was present. Pathology was positive for invasive well differentiated  adenocarcinoma. Staging work up was negative for metastatic disease in the abdomen or pelvis. Previous chest imaging from January and September 2018 showed stable and small pulmonary nodules. He was referred to surgery and underwent laparoscopic sigmoid colectomy per Dr. TMarlou Starkson 02/28/18. Pre op CEA was normal at 1.3.   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 GServando Snare S/p stroke in 01/2016 confirmed by MRI showing 2 separate acute ischemic infarcts. LE doppler showed left LE DVT. Bubble echo showed PFO. Treated with eliquis from 01/2016 - 08/2017. He is widowed, lives alone, has 2 healthy sons. Independent of all ADLs and drives himself. Formerly very active with daily exercise prior to surgery. No family history of cancer. No significant drug or alcohol use, former cigarette smoker quit 11/16/1996 smoked 1.5 PPD x30 years.   Today he presents with his friend. He is recovering well, denies abdominal pain. Appetite is normal. BM every 1-2 days, no hematochezia. 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 He presents to the clinic today accompanied by his wife. He notes he is doing well. His appetite is improving and taste buds have returned to normal. He notes his energy is still lowered but overall  still feeling better. He notices residual tingling to where he will drop light weight objects but still can button his shirt.  He plans to have cataract surgery soon.    MEDICAL HISTORY:  Past Medical History:  Diagnosis Date  . Arthritis   . Blood clot in vein 01/2016  . Colon cancer ([place])   . Colon polyps   . Degenerative joint disease   . Genetic testing 06/11/18   Multi-Cancer panel (83 genes) @  [facility] - No pathogenic mutations detected  . History of kidney stones age 89's  . Hypertension   . Hypothyroidism   . Mass of colon    sigmoid  . Stroke ([place]) 01/2016   speech difficulty for a few hours after knee replacement left  . Thoracic ascending aortic aneurysm ([facility])    4.6-4.7 cm followed by Dr Servando Snare- LOV- 08/2017-epic   . Wears dentures   . Wears glasses     SURGICAL HISTORY: Past Surgical History:  Procedure Laterality Date  . colonscopy  02/26/2018  . KNEE ARTHROPLASTY Left 01/15/2016   Procedure: COMPUTER ASSISTED TOTAL KNEE ARTHROPLASTY;  Surgeon: [name], MD;  Location: [facility];  Service: Orthopedics;  Laterality: Left;  . LAPAROSCOPIC SIGMOID COLECTOMY N/A 03/08/2018   Procedure: LAPAROSCOPIC ASSISTED SIGMOID COLECTOMY ERAS PATHWAY;  Surgeon: Jovita Kussmaul, MD;  Location: [facility];  Service: General;  Laterality: N/A;  . MULTIPLE TOOTH EXTRACTIONS    . TOTAL KNEE ARTHROPLASTY Right 01/13/2017   Procedure: RIGHT TOTAL KNEE ARTHROPLASTY;  Surgeon: [name], MD;  Location: [facility];  Service: Orthopedics;  Laterality: Right;    SOCIAL HISTORY: Social History   Socioeconomic History  . Marital status: Widowed    Spouse name: Not on file  . Number of children: Not on file  . Years of education: Not on file  . Highest education level: Not on file  Occupational History  . Occupation: part time job - delivers car parts  Social Needs  . Financial resource strain: Not on file  . Food insecurity:    Worry: Not on file    Inability: Not on file  . Transportation needs:    Medical: Not on file    Non-medical: Not on file  Tobacco Use  . Smoking status: Former Smoker    Packs/day: 1.50    Years: 30.00    Pack years: 45.00    Types: Cigarettes    Last attempt to quit: 11/16/1996    Years since quitting: 21.7  . Smokeless tobacco: Never Used  . Tobacco comment: quit 25 yrs ago  Substance and Sexual Activity  . Alcohol use: No  . Drug use: No  . Sexual  activity: Not on file  Lifestyle  . Physical activity:    Days per week: Not on file    Minutes per session: Not on file  . Stress: Not on file  Relationships  . Social connections:    Talks on phone: Not on file    Gets together: Not on file    Attends religious service: Not on file    Active member of club or organization: Not on file    Attends meetings of clubs or organizations: Not on file    Relationship status: Not on file  . Intimate partner violence:    Fear of current or ex partner: Not on file    Emotionally abused: Not on file    Physically abused: Not on file    Forced sexual activity: Not on  file  Other Topics Concern  . Not on file  Social History Narrative  . Not on file    FAMILY HISTORY: Family History  Problem Relation Age of Onset  . Congestive Heart Failure Mother   . Diabetes Mother   . Thyroid disease Sister     ALLERGIES:  is allergic to poison oak extract.  MEDICATIONS:  Current Outpatient Medications  Medication Sig Dispense Refill  . Cholecalciferol (VITAMIN D3) 3000 units TABS Take 3,000 Units by mouth daily.    Marland Kitchen levothyroxine (SYNTHROID, LEVOTHROID) 137 MCG tablet Take 137 mcg by mouth daily before breakfast.    . losartan (COZAAR) 100 MG tablet Take 0.5 tablets (50 mg total) by mouth daily. 90 tablet 3  . acetaminophen (TYLENOL) 500 MG tablet Take 1,000 mg by mouth every 6 (six) hours as needed for moderate pain or headache.     . capecitabine (XELODA) 500 MG tablet TAKE 4 TABLETS (2,000 MG TOTAL) BY MOUTH 2 TIMES DAILY AFTER A MEAL. TAKE ON DAYS 1-14 OF CHEMOTHERAPY. REPEAT EVERY 21 DAYS. (Patient not taking: Reported on 07/27/2018) 112 tablet 1  . ondansetron (ZOFRAN) 8 MG tablet Take 1 tablet (8 mg total) by mouth 2 (two) times daily as needed for refractory nausea / vomiting. Start on day 3 after chemotherapy. (Patient not taking: Reported on 07/27/2018) 30 tablet 1  . prochlorperazine (COMPAZINE) 10 MG tablet Take 1 tablet (10 mg total) by  mouth every 6 (six) hours as needed (Nausea or vomiting). (Patient not taking: Reported on 07/27/2018) 30 tablet 1   No current facility-administered medications for this visit.    REVIEW OF SYSTEMS:   Constitutional: Denies fevers, chills or abnormal night sweats (+) low energy (+) improved appetite  Eyes: Denies blurriness of vision, double vision or watery eyes Ears, nose, mouth, throat, and face: Denies mucositis or sore throat   Respiratory: Denies cough, dyspnea or wheezes   Cardiovascular: Denies palpitation, chest discomfort or lower extremity swelling Gastrointestinal:  Denies nausea, vomiting, heartburn or change in bowel habits  Skin: Denies abnormal skin rashes   Lymphatics: Denies new lymphadenopathy or easy bruising Neurological:Denies numbness (+) intermittent tingling in fingers Behavioral/Psych: Mood is stable, no new changes  All other systems were reviewed with the patient and are negative.  PHYSICAL EXAMINATION:  ECOG PERFORMANCE STATUS: 1  Vitals:   07/27/18 0941  BP: 115/77  Pulse: 60  Resp: 17  Temp: 97.7 F (36.5 C)  SpO2: 98%   Filed Weights   07/27/18 0941  Weight: 232 lb 11.2 oz (105.6 kg)    GENERAL:alert, no distress and comfortable SKIN: skin color, texture, turgor are normal, no rashes or significant lesions EYES: normal, conjunctiva are pink and non-injected, sclera clear OROPHARYNX:no exudate, no erythema and lips, buccal mucosa, and tongue normal  LYMPH:  no palpable cervical, supraclavicular, or axillary lymphadenopathy  LUNGS: clear to auscultation with normal breathing effort HEART: regular rate & rhythm and no murmurs and no lower extremity edema ABDOMEN:abdomen soft, non-tender and normal bowel sounds. No hepatomegaly. (+) 2 right abdominal laparoscopic incisions and low abdominal midline incision has healed very well  Musculoskeletal:no cyanosis of digits and no clubbing  PSYCH: alert & oriented x 3 with fluent speech NEURO: no focal  motor/sensory deficits  LABORATORY DATA:  I have reviewed the data as listed CBC Latest Ref Rng & Units 07/27/2018 06/15/2018 05/25/2018  WBC 4.0 - 10.3 K/uL 4.7 5.0 6.0  Hemoglobin 13.0 - 17.1 g/dL 14.0 13.2 13.9  Hematocrit 38.4 -  49.9 % 41.2 38.0(L) 39.9  Platelets 140 - 400 K/uL 146 133(L) 175   CMP Latest Ref Rng & Units 07/27/2018 06/15/2018 05/25/2018  Glucose 70 - 99 mg/dL 95 95 90  BUN 8 - 23 mg/dL _0 Creatinine 0.61 - 1.24 mg/dL 1.07 1.02 1.09  Sodium 135 - 145 mmol/L 138 139 140  Potassium 3.5 - 5.1 mmol/L 4.3 3.4(L) 3.5  Chloride 98 - 111 mmol/L 108 110(H) 110(H)  CO2 22 - 32 mmol/L 21(L) 22 22  Calcium 8.9 - 10.3 mg/dL 9.1 8.9 8.9  Total Protein 6.5 - 8.1 g/dL 7.2 6.6 6.8  Total Bilirubin 0.3 - 1.2 mg/dL 0.7 0.7 0.7  Alkaline Phos 38 - 126 U/L 131(H) 140 111  AST 15 - 41 U/L 33 37(H) 28  ALT 0 - 44 U/L _1 CEA   02/28/18 1.3 baseline  04/13/18: <1 07/27/18: PENDING    PROCEDURE COLONOSCOPY per Dr. Watt Climes 02/26/18 -An infiltrative non-obstructing small mass was found in the mid sigmoid colon. Mass was partially circumferential (involving one third of the lumen circumference). The mass measured 2 cm in length, diameter was 2 mm. No bleeding was present.   -external and internal hemorrhoids -diverticulosis in the sigmoid colon, in the descending colon, in the transverse colon, and at the hepatic flexure -one medium polyp in the mid transverse colon  -ten small polyps in the rectum, in the sigmoid colon, in the descending colon, and in the transverse colon -likely malignant tumor in the mid sigmoid colon -exam was otherwise normal   SURGICAL PATHOLOGY  Diagnosis 03/08/18 1. Colon, segmental resection for tumor, sigmoid, stitch marks distal - INVASIVE ADENOCARCINOMA, MODERATELY DIFFERENTIATED, SPANNING 2.4 CM. - TUMOR FOCALLY INVADES THROUGH MUSCULARIS PROPRIA. - RESECTION MARGIN IS NEGATIVE. - METASTATIC CARCINOMA IN TWO OF TWENTY LYMPH NODES (2/20). -  DIVERTICULOSIS. - HYPERPLASTIC POLYP (X2). - SEE ONCOLOGY TABLE. 2. Colon, resection margin (donut), sigmoid, proximal margin - BENIGN COLONIC MUCOSA. - NO DYSPLASIA OR MALIGNANCY. 3. Colon, resection margin (donut), distal ring - BENIGN COLONIC MUCOSA. - NO DYSPLASIA OR MALIGNANCY. Microscopic Comment 1. COLON AND RECTUM (INCLUDING TRANS-ANAL RESECTION): Specimen: Sigmoid colon. Procedure: Segmental resection. Tumor site: Sigmoid. Specimen integrity: Intact. Macroscopic intactness of mesorectum: N/A. Macroscopic tumor perforation: Not identified. Invasive tumor: Maximum size: 2.4 cm Histologic type(s): Invasive adenocarcinoma. Histologic grade and differentiation: G2: moderately differentiated/low grade Type of polyp in which invasive carcinoma arose: N/A. Microscopic extension of invasive tumor: Tumor focally invades through muscularis propria. Lymph-Vascular invasion: Present. Peri-neural invasion: Not identified. Tumor deposit(s) (discontinuous extramural extension): Not identified. Resection margins: Negative. Proximal margin: Negative. Distal margin: Negative. Circumferential (radial) (posterior ascending, posterior descending; lateral and posterior mid-rectum; and entire lower 1/3 rectum): N/A. Mesenteric margin (sigmoid and transverse): Negative. Distance closest margin (if all above margins negative): >3.2 cm distal. Treatment effect (neo-adjuvant therapy): N/A. Additional polyp(s): Hyperplastic polyp (X2) Non-neoplastic findings: Diverticulosis. Lymph nodes: number examined 19; number positive: 2 Pathologic Staging: pT3, pN1b, pMX Ancillary studies: MSI and MMR will be ordered.  MMR - normal    06/04/18 Genetics     RADIOGRAPHIC STUDIES: I have personally reviewed the radiological images as listed and agreed with the findings in the report.  CT Chest WO Contrast 04/09/18 IMPRESSION: 1. Stable exam. Scattered bilateral tiny pulmonary nodules  without interval change. No new or progressive pulmonary nodule or mass. Attention on follow-up may be warranted. 2. Ascending thoracic aorta measures up to 4.9 cm maximum diameter, increased from 4.6 cm on  prior study. Ascending thoracic aortic aneurysm. Recommend semi-annual imaging followup by CTA or MRA and referral to cardiothoracic surgery if not already obtained. This recommendation follows 2010  CT AP W Contrast 02/28/18 IMPRESSION: -No evidence of metastatic disease or other acute findings. -Colonic diverticulosis, without radiographic evidence of diverticulitis. -Mild hepatic steatosis. -Mildly enlarged prostate.  No results found.  ASSESSMENT & PLAN: Christian Weeks is a 67 y.o. white male with history of intermittent hematochezia for some time attributing to hemorrhoids, positive Cologuard test and found to have non-obstructing mid sigmoid mass on colonoscopy    1. Sigmoid colon adenocarcinoma, well differentiated, pT3N1b,M0, stage IIIB, MMR-normal -We previously reviewed his medical records including lab, imaging, and pathology in detail. His colon cancer was completely resected, margins were negative. Given his stage IIIB disease, he has moderately high risk of recurrence.  -I previously discussed standard adjuvant therapy with CAPOX q3 weeks vs FOLFOX q2 weeks for 3 months to reduce his risk of recurrence; he opted for CAPOX. He is in good physical condition with well controlled co-morbidities, has good social support; he would be a good candidate for chemotherapy. He agreed to proceed.  -He has started adjuvant Capox on 04/13/18. Unfortunately due to his misunderstanding, he only took 50% of the Xeloda dose as prescribed for the first cycle. I also slightly decreased his oxaliplatin dose due to fatigue and grade I neuropathy. He completed Xeloda on 06/29/18.  -He is recovering well from chemotherapy with mild residual neuropathy in his fingers and lower energy, but overall improving.   -Labs reviewed today, CEA, CBC and CMP WNL with alk phos at 131.  -I discussed he his almost a month after completing treatment. He is fine to proceed with minor cataract surgery. I encouraged him to stay up to date with immunizations and flu shots for at least the next 3 years post chemotherapy.   -I discussed the surveillance plan, which is a physical exam and lab test (including CBC, CMP and CEA) every 3-4 months for the first 2 years, then every 6-12 months, colonoscopy in one year, and surveillance CT scan every 6-12 month for up to 5 year.  -F/u in 3 months    2. Genetics  -given he had more than 10 polyps on colonoscopy, he qualifies for genetics referral. He has 2 children.  -His 06/04/18 genetic testing was negative for mutations. I previously recommend his children have age appropriate colonoscopies.   3. Weight loss -He previously had weight loss after surgery. He would like to lose more weight. I previously encouraged him to avoid active weight loss while under chemotherapy; I previously recommended that he eat well, be active, and remain hydrated. He agrees.  -His weight is still trending down. His appetite has returned to normal, he will try to eat more  4. HTN, thoracic aneurysm -BP well controlled on losartan per PCP -Stable 4.6 ascending thoracic aortic aneurysm followed by Dr. Servando Snare, last CTA 08/2017 with recommendation to repeat semi-annually  -His BP is in the low range of normal, I previously discussed his BP may be low throughout chemo due to decreased po intake.  -I previously advised him to check his BP at home and if it is low and he feels dizzy, it is okay for him to hold Losartan.  -BP at 115/77 today (07/27/18)  5. 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 On eliquis from 01/2016 - 08/2017. No recurrent edema. Followed by PCP and neuro.  -I  previously encouraged him to remain active as chemotherapy can increase his  risk for blood clots.  6. Fatigue  -Secondary to chemotherapy.  He is still able to function at home, he has reduced his work hours -I encouraged him to stay active, we previously discussed the recovery from chemotherapy may take a few months. -Improving  7.  Peripheral neuropathy, grade 1 -Secondary to chemotherapy, overall mild -Continue monitoring   PLAN: -Lab and follow up in 3 months    All questions were answered. The patient knows to call the clinic with any problems, questions or concerns. I spent 20 minutes counseling the patient face to face. The total time spent in the appointment was 25 minutes and more than 50% was on counseling.  Oneal Deputy, am acting as scribe for Truitt Merle, MD.   I have reviewed the above documentation for accuracy and completeness, and I agree with the above.        Truitt Merle, MD 07/27/2018

## 2018-07-27 ENCOUNTER — Encounter: Payer: Self-pay | Admitting: Hematology

## 2018-07-27 ENCOUNTER — Telehealth: Payer: Self-pay

## 2018-07-27 ENCOUNTER — Inpatient Hospital Stay: Payer: Medicare HMO | Attending: Nurse Practitioner | Admitting: Hematology

## 2018-07-27 ENCOUNTER — Inpatient Hospital Stay: Payer: Medicare HMO

## 2018-07-27 VITALS — BP 115/77 | HR 60 | Temp 97.7°F | Resp 17 | Ht 72.0 in | Wt 232.7 lb

## 2018-07-27 DIAGNOSIS — Z86718 Personal history of other venous thrombosis and embolism: Secondary | ICD-10-CM

## 2018-07-27 DIAGNOSIS — C187 Malignant neoplasm of sigmoid colon: Secondary | ICD-10-CM | POA: Diagnosis not present

## 2018-07-27 DIAGNOSIS — G620 Drug-induced polyneuropathy: Secondary | ICD-10-CM

## 2018-07-27 DIAGNOSIS — Z8673 Personal history of transient ischemic attack (TIA), and cerebral infarction without residual deficits: Secondary | ICD-10-CM | POA: Insufficient documentation

## 2018-07-27 DIAGNOSIS — Z87891 Personal history of nicotine dependence: Secondary | ICD-10-CM | POA: Insufficient documentation

## 2018-07-27 DIAGNOSIS — I10 Essential (primary) hypertension: Secondary | ICD-10-CM

## 2018-07-27 DIAGNOSIS — C772 Secondary and unspecified malignant neoplasm of intra-abdominal lymph nodes: Secondary | ICD-10-CM | POA: Diagnosis not present

## 2018-07-27 DIAGNOSIS — R530 Neoplastic (malignant) related fatigue: Secondary | ICD-10-CM

## 2018-07-27 LAB — CMP (CANCER CENTER ONLY)
ALBUMIN: 3.7 g/dL (ref 3.5–5.0)
ALK PHOS: 131 U/L — AB (ref 38–126)
ALT: 21 U/L (ref 0–44)
ANION GAP: 9 (ref 5–15)
AST: 33 U/L (ref 15–41)
BILIRUBIN TOTAL: 0.7 mg/dL (ref 0.3–1.2)
BUN: 13 mg/dL (ref 8–23)
CALCIUM: 9.1 mg/dL (ref 8.9–10.3)
CO2: 21 mmol/L — AB (ref 22–32)
CREATININE: 1.07 mg/dL (ref 0.61–1.24)
Chloride: 108 mmol/L (ref 98–111)
GFR, Est AFR Am: >60 mL/min (ref >60)
GFR, Estimated: >60 mL/min (ref >60)
GLUCOSE: 95 mg/dL (ref 70–99)
Potassium: 4.3 mmol/L (ref 3.5–5.1)
Sodium: 138 mmol/L (ref 135–145)
TOTAL PROTEIN: 7.2 g/dL (ref 6.5–8.1)

## 2018-07-27 LAB — CBC WITH DIFFERENTIAL (CANCER CENTER ONLY)
BASOS PCT: 1 %
Basophils Absolute: 0 10*3/uL (ref 0.0–0.1)
Eosinophils Absolute: 0.2 10*3/uL (ref 0.0–0.5)
Eosinophils Relative: 5 %
HEMATOCRIT: 41.2 % (ref 38.4–49.9)
HEMOGLOBIN: 14 g/dL (ref 13.0–17.1)
LYMPHS ABS: 1 10*3/uL (ref 0.9–3.3)
Lymphocytes Relative: 22 %
MCH: 33.1 pg (ref 27.2–33.4)
MCHC: 33.9 g/dL (ref 32.0–36.0)
MCV: 97.8 fL (ref 79.3–98.0)
MONOS PCT: 14 %
Monocytes Absolute: 0.7 10*3/uL (ref 0.1–0.9)
NEUTROS ABS: 2.7 10*3/uL (ref 1.5–6.5)
Neutrophils Relative %: 58 %
Platelet Count: 146 10*3/uL (ref 140–400)
RBC: 4.22 MIL/uL (ref 4.20–5.82)
RDW: 15.3 % — ABNORMAL HIGH (ref 11.0–14.6)
WBC: 4.7 10*3/uL (ref 4.0–10.3)

## 2018-07-27 LAB — CEA (IN HOUSE-CHCC): CEA (CHCC-In House): 1.41 ng/mL (ref 0.00–5.00)

## 2018-07-27 NOTE — Telephone Encounter (Signed)
Printed avs and calender of upcoming appointment. Per 8/2 los

## 2018-09-20 DIAGNOSIS — Z125 Encounter for screening for malignant neoplasm of prostate: Secondary | ICD-10-CM | POA: Diagnosis not present

## 2018-09-20 DIAGNOSIS — E559 Vitamin D deficiency, unspecified: Secondary | ICD-10-CM | POA: Diagnosis not present

## 2018-09-20 DIAGNOSIS — E039 Hypothyroidism, unspecified: Secondary | ICD-10-CM | POA: Diagnosis not present

## 2018-09-20 DIAGNOSIS — M8580 Other specified disorders of bone density and structure, unspecified site: Secondary | ICD-10-CM | POA: Diagnosis not present

## 2018-09-20 DIAGNOSIS — I10 Essential (primary) hypertension: Secondary | ICD-10-CM | POA: Diagnosis not present

## 2018-09-24 DIAGNOSIS — Z0001 Encounter for general adult medical examination with abnormal findings: Secondary | ICD-10-CM | POA: Diagnosis not present

## 2018-09-24 DIAGNOSIS — E785 Hyperlipidemia, unspecified: Secondary | ICD-10-CM | POA: Diagnosis not present

## 2018-09-24 DIAGNOSIS — H7292 Unspecified perforation of tympanic membrane, left ear: Secondary | ICD-10-CM | POA: Diagnosis not present

## 2018-09-24 DIAGNOSIS — E559 Vitamin D deficiency, unspecified: Secondary | ICD-10-CM | POA: Diagnosis not present

## 2018-09-24 DIAGNOSIS — I712 Thoracic aortic aneurysm, without rupture: Secondary | ICD-10-CM | POA: Diagnosis not present

## 2018-09-24 DIAGNOSIS — R7303 Prediabetes: Secondary | ICD-10-CM | POA: Diagnosis not present

## 2018-09-24 DIAGNOSIS — E032 Hypothyroidism due to medicaments and other exogenous substances: Secondary | ICD-10-CM | POA: Diagnosis not present

## 2018-09-24 DIAGNOSIS — Z6832 Body mass index (BMI) 32.0-32.9, adult: Secondary | ICD-10-CM | POA: Diagnosis not present

## 2018-09-24 DIAGNOSIS — I6529 Occlusion and stenosis of unspecified carotid artery: Secondary | ICD-10-CM | POA: Diagnosis not present

## 2018-09-24 DIAGNOSIS — I10 Essential (primary) hypertension: Secondary | ICD-10-CM | POA: Diagnosis not present

## 2018-09-26 ENCOUNTER — Telehealth: Payer: Self-pay | Admitting: Hematology

## 2018-09-26 NOTE — Telephone Encounter (Signed)
[facility] CME 11/1 - moved appointments from 11/1 to 10/29. Spoke with patient.

## 2018-10-19 NOTE — Progress Notes (Signed)
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 Watt Climes -An infiltrative non-obstructing small mass was found in the mid sigmoid colon. Mass was partially circumferential (involving one third of the lumen circumference). The mass measured 2 cm in length, diameter was 2 mm. No bleeding was present.   -external and internal hemorrhoids -diverticulosis in the sigmoid colon, in the descending colon, in the transverse colon, and at the hepatic flexure -one medium polyp in the mid transverse colon  -ten small polyps in the rectum, in the sigmoid colon, in the descending colon, and in the transverse colon -likely malignant tumor in the mid sigmoid colon -exam was otherwise normal     02/26/2018 Initial Biopsy    From colonoscopy: 1. LG intestine- transverse colon, descending, polyp -Tubular adenomas (8). Sessile serrated adenoma/polyp 2. LG intestine - sigmoid colon bx -INVASIVE WELL DIFFERENTIATED ADENOCARCINOMA 3. LG intestine -sigmoid colon, rectum, polyp -Tubular adenoma with high grade dysplasia. Hypoplastic polyps (3).    02/26/2018 Initial  Diagnosis    Colon cancer ([place])    02/28/2018 Imaging    CT AP IMPRESSION: -No evidence of metastatic disease or other acute findings. -Colonic diverticulosis, without radiographic evidence of diverticulitis. -Mild hepatic steatosis. -Mildly enlarged prostate.     03/07/2018 Tumor Marker    CEA 1.3 (pre-op)    03/08/2018 Surgery    PROCEDURE:  Procedure(s): LAPAROSCOPIC ASSISTED SIGMOID COLECTOMY ERAS PATHWAY  MOBILIZATION OF SPLENIC FLEXURE RIGID SIGMOIDOSCOPY    03/08/2018 Cancer Staging    Staging form: Colon and Rectum, AJCC 8th Edition - Pathologic stage from 03/08/2018: Stage IIIB (pT3, pN1b, cM0) - Signed by Alla Feeling, NP on 04/05/2018    03/08/2018 Pathology Results    Diagnosis 1. Colon, segmental resection for tumor, sigmoid, stitch marks distal - INVASIVE ADENOCARCINOMA, MODERATELY DIFFERENTIATED, SPANNING 2.4 CM. - TUMOR FOCALLY INVADES THROUGH MUSCULARIS PROPRIA. - RESECTION MARGIN IS NEGATIVE. - METASTATIC CARCINOMA IN TWO OF TWENTY LYMPH NODES (2/20). - DIVERTICULOSIS. - HYPERPLASTIC POLYP (X2). - SEE ONCOLOGY TABLE. -MMR normal     04/09/2018 Imaging    CT Chest WO Contrast 04/09/18 IMPRESSION: 1. Stable exam. Scattered bilateral tiny pulmonary nodules without interval change. No new or progressive pulmonary nodule or mass. Attention on follow-up may be warranted. 2. Ascending thoracic aorta measures up to 4.9 cm maximum diameter, increased from 4.6 cm on prior study. Ascending thoracic aortic aneurysm. Recommend semi-annual imaging followup by CTA or MRA and referral to cardiothoracic surgery if not already obtained. This recommendation follows 2010 ACCF/AHA/AATS/ACR/ASA/SCA/SCAI/SIR/STS/SVM Guidelines for the Diagnosis and Management of Patients With Thoracic Aortic Disease. Circulation. 2010; 121: Q759-F638    04/13/2018 - 07/05/2018 Chemotherapy  Adjuvant CAPOX every 3 weeks. Oxaliplatin 168m/m2, 2000 mg Xeloda BID on days 1-14, every 21 days  starting 04/13/18. Dose reduced for first cycle. Last oxaliplatin treatment 06/15/18 at reduced dose due to neuropathy and fatigue, complete Xeloda on 06/29/18    06/04/2018 Genetic Testing    06/04/18 Testing did not reveal a pathogenic mutation in any of the genes analyzed. A copy of the genetic test report will be scanned into Epic under the Media tab.  The genes analyzed were the 83 genes on [facility]'s Multi-Cancer panel (ALK, APC, ATM, AXIN2, BAP1, BARD1, BLM, BMPR1A, BRCA1, BRCA2, BRIP1, CASR, CDC73, CDH1, CDK4, CDKN1B, CDKN1C, CDKN2A, CEBPA, CHEK2, CTNNA1, DICER1, DIS3L2, EGFR, EPCAM, FH, FLCN, GATA2, GPC3, GREM1, HOXB13, HRAS, KIT, MAX, MEN1, MET, MITF, MLH1, MSH2, MSH3, MSH6, MUTYH, NBN, NF1, NF2, NTHL1, PALB2, PDGFRA, PHOX2B, PMS2, POLD1, POLE, POT1, PRKAR1A, PTCH1, PTEN, RAD50, RAD51C, RAD51D, RB1, RECQL4, RET, RUNX1, SDHA, SDHAF2, SDHB, SDHC, SDHD, SMAD4, SMARCA4, SMARCB1, SMARCE1, STK11, SUFU, TERC, TERT, TMEM127, TP53, TSC1, TSC2, VHL, WRN, WT1).     HISTORY OF PRESENTING ILLNESS:  Christian MACAULEY67y.o. male is here because of newly diagnosed sigmoid colon adenocarcinoma. He reports infrequent episodes of small bright red blood in stool for several years he attributed to hemorrhoids. He was finally convinced by his PCP Dr. PShelia Mediato do Cologuard testing which returned positive. Denies weight loss, decreased appetite, fatigue, change in bowel habits, or abdominal pain. He was referred to Dr. MWatt Climesfor colonoscopy on 02/26/2018.  This was his first colonoscopy.  External and internal hemorrhoids were found on exam as well as scattered diverticula throughout the colon. Ten polyps were found in the transverse, descending, sigmoid colon and rectum. An infiltrative non--obstructing small mass was found in the mid sigmoid colon, partially circumferential involving one third of the lumen, no bleeding was present. Pathology was positive for invasive well differentiated adenocarcinoma. Staging work up was  negative for metastatic disease in the abdomen or pelvis. Previous chest imaging from January and September 2018 showed stable and small pulmonary nodules. He was referred to surgery and underwent laparoscopic sigmoid colectomy per Dr. TMarlou Starkson 02/28/18. Pre op CEA was normal at 1.3.   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 GServando Snare S/p stroke in 01/2016 confirmed by MRI showing 2 separate acute ischemic infarcts. LE doppler showed left LE DVT. Bubble echo showed PFO. Treated with eliquis from 01/2016 - 08/2017. He is widowed, lives alone, has 2 healthy sons. Independent of all ADLs and drives himself. Formerly very active with daily exercise prior to surgery. No family history of cancer. No significant drug or alcohol use, former cigarette smoker quit 11/16/1996 smoked 1.5 PPD x30 years.   Today he presents with his friend. He is recovering well, denies abdominal pain. Appetite is normal. BM every 1-2 days, no hematochezia. 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 He is here today alone. He is doing well and states that he eats well. He craves peanut butter at night. He denies abdominal pain or changes in BM. He still has neuropathy in his fingers that gets worse in cold temperatures. He still drops small object, but can write and button his shirt. He can sleep well and his  neuropathy doesn't wake him up. He states that he will have cataract surgery soon.   He states that his wife passed away 2 years ago and he currently has a lady friend.    MEDICAL HISTORY:  Past Medical History:  Diagnosis Date  . Arthritis   . Blood clot in vein 01/2016  . Colon cancer ([place])   . Colon polyps   . Degenerative  joint disease   . Genetic testing 06/11/18   Multi-Cancer panel (83 genes) @ [facility] - No pathogenic mutations detected  . History of kidney stones age 50's  . Hypertension   . Hypothyroidism   . Mass of colon    sigmoid  . Stroke ([place]) 01/2016   speech difficulty for a few hours after knee replacement left  . Thoracic ascending aortic aneurysm ([facility])    4.6-4.7 cm followed by Dr Servando Snare- LOV- 08/2017-epic   . Wears dentures   . Wears glasses     SURGICAL HISTORY: Past Surgical History:  Procedure Laterality Date  . colonscopy  02/26/2018  . KNEE ARTHROPLASTY Left 01/15/2016   Procedure: COMPUTER ASSISTED TOTAL KNEE ARTHROPLASTY;  Surgeon: [name], MD;  Location: [facility];  Service: Orthopedics;  Laterality: Left;  . LAPAROSCOPIC SIGMOID COLECTOMY N/A 03/08/2018   Procedure: LAPAROSCOPIC ASSISTED SIGMOID COLECTOMY ERAS PATHWAY;  Surgeon: Jovita Kussmaul, MD;  Location: [facility];  Service: General;  Laterality: N/A;  . MULTIPLE TOOTH EXTRACTIONS    . TOTAL KNEE ARTHROPLASTY Right 01/13/2017   Procedure: RIGHT TOTAL KNEE ARTHROPLASTY;  Surgeon: [name], MD;  Location: [place];  Service: Orthopedics;  Laterality: Right;    SOCIAL HISTORY: Social History   Socioeconomic History  . Marital status: Widowed    Spouse name: Not on file  . Number of children: Not on file  . Years of education: Not on file  . Highest education level: Not on file  Occupational History  . Occupation: part time job - delivers car parts  Social Needs  . Financial resource strain: Not on file  . Food insecurity:    Worry: Not on file    Inability: Not on file  . Transportation needs:    Medical: Not on file    Non-medical: Not on file  Tobacco Use  . Smoking status: Former Smoker    Packs/day: 1.50    Years: 30.00    Pack years: 45.00    Types: Cigarettes    Last attempt to quit: 11/16/1996    Years since quitting: 21.9  . Smokeless tobacco: Never Used  . Tobacco comment: quit 25 yrs ago    Substance and Sexual Activity  . Alcohol use: No  . Drug use: No  . Sexual activity: Not on file  Lifestyle  . Physical activity:    Days per week: Not on file    Minutes per session: Not on file  . Stress: Not on file  Relationships  . Social connections:    Talks on phone: Not on file    Gets together: Not on file    Attends religious service: Not on file    Active member of club or organization: Not on file    Attends meetings of clubs or organizations: Not on file    Relationship status: Not on file  . Intimate partner violence:    Fear of current or ex partner: Not on file    Emotionally abused: Not on file    Physically abused: Not on file  Forced sexual activity: Not on file  Other Topics Concern  . Not on file  Social History Narrative  . Not on file    FAMILY HISTORY: Family History  Problem Relation Age of Onset  . Congestive Heart Failure Mother   . Diabetes Mother   . Thyroid disease Sister     ALLERGIES:  is allergic to poison oak extract.  MEDICATIONS:  Current Outpatient Medications  Medication Sig Dispense Refill  . acetaminophen (TYLENOL) 500 MG tablet Take 1,000 mg by mouth every 6 (six) hours as needed for moderate pain or headache.     . capecitabine (XELODA) 500 MG tablet TAKE 4 TABLETS (2,000 MG TOTAL) BY MOUTH 2 TIMES DAILY AFTER A MEAL. TAKE ON DAYS 1-14 OF CHEMOTHERAPY. REPEAT EVERY 21 DAYS. 112 tablet 1  . Cholecalciferol (VITAMIN D3) 3000 units TABS Take 3,000 Units by mouth daily.    Marland Kitchen levothyroxine (SYNTHROID, LEVOTHROID) 137 MCG tablet Take 137 mcg by mouth daily before breakfast.    . losartan (COZAAR) 100 MG tablet Take 0.5 tablets (50 mg total) by mouth daily. 90 tablet 3  . ondansetron (ZOFRAN) 8 MG tablet Take 1 tablet (8 mg total) by mouth 2 (two) times daily as needed for refractory nausea / vomiting. Start on day 3 after chemotherapy. 30 tablet 1  . prochlorperazine (COMPAZINE) 10 MG tablet Take 1 tablet (10 mg total) by mouth  every 6 (six) hours as needed (Nausea or vomiting). 30 tablet 1   No current facility-administered medications for this visit.    REVIEW OF SYSTEMS:   Constitutional: Denies fevers, chills or abnormal night sweats (+) good appetite Eyes: Denies blurriness of vision, double vision or watery eyes Ears, nose, mouth, throat, and face: Denies mucositis or sore throat   Respiratory: Denies cough, dyspnea or wheezes   Cardiovascular: Denies palpitation, chest discomfort or lower extremity swelling Gastrointestinal:  Denies nausea, vomiting, heartburn or change in bowel habits  Skin: Denies abnormal skin rashes   Lymphatics: Denies new lymphadenopathy or easy bruising Neurological:Denies numbness (+) intermittent tingling in fingers, worse in cold temperatures (+) drops small objects Behavioral/Psych: Mood is stable, no new changes  All other systems were reviewed with the patient and are negative.  PHYSICAL EXAMINATION:  ECOG PERFORMANCE STATUS: 1  Vitals:   10/23/18 1301  BP: 113/87  Pulse: 77  Resp: 18  Temp: 98.1 F (36.7 C)  SpO2: 96%   Filed Weights   10/23/18 1301  Weight: 238 lb 1.6 oz (108 kg)    GENERAL:alert, no distress and comfortable SKIN: skin color, texture, turgor are normal, no rashes or significant lesions EYES: normal, conjunctiva are pink and non-injected, sclera clear OROPHARYNX:no exudate, no erythema and lips, buccal mucosa, and tongue normal  LYMPH:  no palpable cervical, supraclavicular, or axillary lymphadenopathy  LUNGS: clear to auscultation with normal breathing effort HEART: regular rate & rhythm and no murmurs and no lower extremity edema ABDOMEN:abdomen soft, non-tender and normal bowel sounds. No hepatomegaly. (+) 2 right abdominal laparoscopic incisions and low abdominal midline incision has healed very well  Musculoskeletal:no cyanosis of digits and no clubbing  PSYCH: alert & oriented x 3 with fluent speech NEURO: no focal motor/sensory  deficits  LABORATORY DATA:  I have reviewed the data as listed CBC Latest Ref Rng & Units 10/23/2018 07/27/2018 06/15/2018  WBC 4.0 - 10.5 K/uL 5.4 4.7 5.0  Hemoglobin 13.0 - 17.0 g/dL 15.5 14.0 13.2  Hematocrit 39.0 - 52.0 % 46.1 41.2 38.0(L)  Platelets 150 -  400 K/uL 144(L) 146 133(L)   CMP Latest Ref Rng & Units 10/23/2018 07/27/2018 06/15/2018  Glucose 70 - 99 mg/dL 95 95 95  BUN 8 - 23 mg/dL _0 Creatinine 0.61 - 1.24 mg/dL 1.09 1.07 1.02  Sodium 135 - 145 mmol/L 140 138 139  Potassium 3.5 - 5.1 mmol/L 4.4 4.3 3.4(L)  Chloride 98 - 111 mmol/L 109 108 110(H)  CO2 22 - 32 mmol/L 24 21(L) 22  Calcium 8.9 - 10.3 mg/dL 9.3 9.1 8.9  Total Protein 6.5 - 8.1 g/dL 7.3 7.2 6.6  Total Bilirubin 0.3 - 1.2 mg/dL 0.6 0.7 0.7  Alkaline Phos 38 - 126 U/L 129(H) 131(H) 140  AST 15 - 41 U/L 32 33 37(H)  ALT 0 - 44 U/L _1 CEA   02/28/18 1.3 baseline  04/13/18: <1 07/27/18: 1.41  10/23/18: <1   PROCEDURE COLONOSCOPY per Dr. Watt Climes 02/26/18 -An infiltrative non-obstructing small mass was found in the mid sigmoid colon. Mass was partially circumferential (involving one third of the lumen circumference). The mass measured 2 cm in length, diameter was 2 mm. No bleeding was present.   -external and internal hemorrhoids -diverticulosis in the sigmoid colon, in the descending colon, in the transverse colon, and at the hepatic flexure -one medium polyp in the mid transverse colon  -ten small polyps in the rectum, in the sigmoid colon, in the descending colon, and in the transverse colon -likely malignant tumor in the mid sigmoid colon -exam was otherwise normal   SURGICAL PATHOLOGY  Diagnosis 03/08/18 1. Colon, segmental resection for tumor, sigmoid, stitch marks distal - INVASIVE ADENOCARCINOMA, MODERATELY DIFFERENTIATED, SPANNING 2.4 CM. - TUMOR FOCALLY INVADES THROUGH MUSCULARIS PROPRIA. - RESECTION MARGIN IS NEGATIVE. - METASTATIC CARCINOMA IN TWO OF TWENTY LYMPH NODES (2/20). -  DIVERTICULOSIS. - HYPERPLASTIC POLYP (X2). - SEE ONCOLOGY TABLE. 2. Colon, resection margin (donut), sigmoid, proximal margin - BENIGN COLONIC MUCOSA. - NO DYSPLASIA OR MALIGNANCY. 3. Colon, resection margin (donut), distal ring - BENIGN COLONIC MUCOSA. - NO DYSPLASIA OR MALIGNANCY. Microscopic Comment 1. COLON AND RECTUM (INCLUDING TRANS-ANAL RESECTION): Specimen: Sigmoid colon. Procedure: Segmental resection. Tumor site: Sigmoid. Specimen integrity: Intact. Macroscopic intactness of mesorectum: N/A. Macroscopic tumor perforation: Not identified. Invasive tumor: Maximum size: 2.4 cm Histologic type(s): Invasive adenocarcinoma. Histologic grade and differentiation: G2: moderately differentiated/low grade Type of polyp in which invasive carcinoma arose: N/A. Microscopic extension of invasive tumor: Tumor focally invades through muscularis propria. Lymph-Vascular invasion: Present. Peri-neural invasion: Not identified. Tumor deposit(s) (discontinuous extramural extension): Not identified. Resection margins: Negative. Proximal margin: Negative. Distal margin: Negative. Circumferential (radial) (posterior ascending, posterior descending; lateral and posterior mid-rectum; and entire lower 1/3 rectum): N/A. Mesenteric margin (sigmoid and transverse): Negative. Distance closest margin (if all above margins negative): >3.2 cm distal. Treatment effect (neo-adjuvant therapy): N/A. Additional polyp(s): Hyperplastic polyp (X2) Non-neoplastic findings: Diverticulosis. Lymph nodes: number examined 19; number positive: 2 Pathologic Staging: pT3, pN1b, pMX Ancillary studies: MSI and MMR will be ordered.  MMR - normal    06/04/18 Genetics     RADIOGRAPHIC STUDIES: I have personally reviewed the radiological images as listed and agreed with the findings in the report.  CT Chest WO Contrast 04/09/18 IMPRESSION: 1. Stable exam. Scattered bilateral tiny pulmonary nodules  without interval change. No new or progressive pulmonary nodule or mass. Attention on follow-up may be warranted. 2. Ascending thoracic aorta measures up to 4.9 cm maximum diameter, increased from 4.6 cm on prior study. Ascending thoracic aortic aneurysm. Recommend  semi-annual imaging followup by CTA or MRA and referral to cardiothoracic surgery if not already obtained. This recommendation follows 2010  CT AP W Contrast 02/28/18 IMPRESSION: -No evidence of metastatic disease or other acute findings. -Colonic diverticulosis, without radiographic evidence of diverticulitis. -Mild hepatic steatosis. -Mildly enlarged prostate.  No results found.  ASSESSMENT & PLAN:  Christian Weeks is a 67 y.o. white male with history of intermittent hematochezia for some time attributing to hemorrhoids, positive Cologuard test and found to have non-obstructing mid sigmoid mass on colonoscopy    1. Sigmoid colon adenocarcinoma, well differentiated, pT3N1b,M0, stage IIIB, MMR-normal -We previously reviewed his medical records including lab, imaging, and pathology in detail. His colon cancer was completely resected, margins were negative. Given his stage IIIB disease, he has moderately high risk of recurrence.  -I previously discussed standard adjuvant therapy with CAPOX q3 weeks vs FOLFOX q2 weeks for 3 months to reduce his risk of recurrence; he opted for CAPOX. He is in good physical condition with well controlled co-morbidities, has good social support; he would be a good candidate for chemotherapy. He agreed to proceed.  -He has started adjuvant Capox on 04/13/18. Unfortunately due to his misunderstanding, he only took 50% of the Xeloda dose as prescribed for the first cycle. I also slightly decreased his oxaliplatin dose due to fatigue and grade I neuropathy. He completed Xeloda on 06/29/18.  -He has recovered very well from chemotherapy except mild residual neuropathy in his fingers  -Labs reviewed today, CBC WNLs  except for platelets 144K. Alkaline phosphate 129. CEA <1.  -I encouraged him to stay up to date with immunizations and flu shots for at least the next 3 years post chemotherapy.   -I advised him to use vitamin B for his neuropathy. He agrees. -He will have colonoscopy with Dr. Theodoro Clock in 3 months -F/u in 3 months with scans before   2. Genetics  -given he had more than 10 polyps on colonoscopy, he qualifies for genetics referral. He has 2 children.  -His 06/04/18 genetic testing was negative for mutations. I previously recommend his children have age appropriate colonoscopies.   3. HTN, thoracic aneurysm -BP well controlled on losartan per PCP -Stable 4.6 ascending thoracic aortic aneurysm followed by Dr. Servando Snare  4. 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 On eliquis from 01/2016 - 08/2017. No recurrent edema. Followed by PCP and neuro.  -I previously encouraged him to remain active as chemotherapy can increase his risk for blood clots.  5.  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 Servando Snare with a CTA of chest for his aneurysm follow-up also in 3 months, I sent a message to Dr. Servando Snare today.   All questions were answered. The patient knows to call the clinic with any problems, questions or concerns. I spent 20 minutes counseling the patient face to face. The total time spent in the appointment was 25 minutes and more than 50% was on counseling.  Dierdre Searles Dweik am acting as scribe for Dr. Truitt Merle.  I have reviewed the above documentation for accuracy and completeness, and I agree with the above       Truitt Merle, MD 10/23/2018

## 2018-10-23 ENCOUNTER — Encounter: Payer: Self-pay | Admitting: *Deleted

## 2018-10-23 ENCOUNTER — Encounter: Payer: Self-pay | Admitting: Hematology

## 2018-10-23 ENCOUNTER — Telehealth: Payer: Self-pay

## 2018-10-23 ENCOUNTER — Inpatient Hospital Stay: Payer: Medicare HMO | Attending: Nurse Practitioner

## 2018-10-23 ENCOUNTER — Inpatient Hospital Stay: Payer: Medicare HMO | Admitting: Hematology

## 2018-10-23 VITALS — BP 113/87 | HR 77 | Temp 98.1°F | Resp 18 | Ht 72.0 in | Wt 238.1 lb

## 2018-10-23 DIAGNOSIS — I10 Essential (primary) hypertension: Secondary | ICD-10-CM | POA: Diagnosis not present

## 2018-10-23 DIAGNOSIS — I82402 Acute embolism and thrombosis of unspecified deep veins of left lower extremity: Secondary | ICD-10-CM | POA: Insufficient documentation

## 2018-10-23 DIAGNOSIS — G620 Drug-induced polyneuropathy: Secondary | ICD-10-CM | POA: Insufficient documentation

## 2018-10-23 DIAGNOSIS — Z87891 Personal history of nicotine dependence: Secondary | ICD-10-CM | POA: Insufficient documentation

## 2018-10-23 DIAGNOSIS — C772 Secondary and unspecified malignant neoplasm of intra-abdominal lymph nodes: Secondary | ICD-10-CM

## 2018-10-23 DIAGNOSIS — C187 Malignant neoplasm of sigmoid colon: Secondary | ICD-10-CM

## 2018-10-23 DIAGNOSIS — I712 Thoracic aortic aneurysm, without rupture: Secondary | ICD-10-CM

## 2018-10-23 DIAGNOSIS — Z8673 Personal history of transient ischemic attack (TIA), and cerebral infarction without residual deficits: Secondary | ICD-10-CM | POA: Insufficient documentation

## 2018-10-23 LAB — CBC WITH DIFFERENTIAL (CANCER CENTER ONLY)
ABS IMMATURE GRANULOCYTES: 0.01 10*3/uL (ref 0.00–0.07)
BASOS ABS: 0 10*3/uL (ref 0.0–0.1)
Basophils Relative: 0 %
EOS PCT: 5 %
Eosinophils Absolute: 0.3 10*3/uL (ref 0.0–0.5)
HCT: 46.1 % (ref 39.0–52.0)
Hemoglobin: 15.5 g/dL (ref 13.0–17.0)
Immature Granulocytes: 0 %
LYMPHS ABS: 1.3 10*3/uL (ref 0.7–4.0)
Lymphocytes Relative: 25 %
MCH: 29.8 pg (ref 26.0–34.0)
MCHC: 33.6 g/dL (ref 30.0–36.0)
MCV: 88.5 fL (ref 80.0–100.0)
Monocytes Absolute: 0.7 10*3/uL (ref 0.1–1.0)
Monocytes Relative: 13 %
NRBC: 0 % (ref 0.0–0.2)
Neutro Abs: 3.1 10*3/uL (ref 1.7–7.7)
Neutrophils Relative %: 57 %
PLATELETS: 144 10*3/uL — AB (ref 150–400)
RBC: 5.21 MIL/uL (ref 4.22–5.81)
RDW: 12.5 % (ref 11.5–15.5)
WBC: 5.4 10*3/uL (ref 4.0–10.5)

## 2018-10-23 LAB — CMP (CANCER CENTER ONLY)
ALT: 26 U/L (ref 0–44)
AST: 32 U/L (ref 15–41)
Albumin: 3.7 g/dL (ref 3.5–5.0)
Alkaline Phosphatase: 129 U/L — ABNORMAL HIGH (ref 38–126)
Anion gap: 7 (ref 5–15)
BUN: 13 mg/dL (ref 8–23)
CALCIUM: 9.3 mg/dL (ref 8.9–10.3)
CO2: 24 mmol/L (ref 22–32)
CREATININE: 1.09 mg/dL (ref 0.61–1.24)
Chloride: 109 mmol/L (ref 98–111)
GFR, Estimated: >60 mL/min (ref >60)
Glucose, Bld: 95 mg/dL (ref 70–99)
Potassium: 4.4 mmol/L (ref 3.5–5.1)
Sodium: 140 mmol/L (ref 135–145)
Total Bilirubin: 0.6 mg/dL (ref 0.3–1.2)
Total Protein: 7.3 g/dL (ref 6.5–8.1)

## 2018-10-23 LAB — CEA (IN HOUSE-CHCC)

## 2018-10-23 NOTE — Progress Notes (Signed)
10/23/18 at 9:11am - The pt was into the cancer center this morning for his routine follow up appointment.  The research nurse thanked the pt for his participation in the study and for completing/returning his baseline questionnaires.  The research nurse stated that someone from the study contacted our site inquiring about the pt's month 3 questionnaires.  The pt stated that he indeed received the questionnaire in the mail.  He said that it is sitting on his counter, and he would complete it this week.  The pt was thanked for his support of the trial. The pt was informed that he should be receiving his month 6 questionnaire in the mail towards the middle of November.  He verbalized understanding.  The pt will be seen and examined today by Dr. Burr Medico.  The pt thanked the nurse for following up with him regarding the study. Brion Aliment RN, BSN, CCRP Clinical Research Nurse 10/23/2018 9:16 AM

## 2018-10-23 NOTE — Telephone Encounter (Signed)
Printed avs and calender of upcoming appointment. Per 10/29 los 

## 2018-10-26 ENCOUNTER — Ambulatory Visit: Payer: Medicare HMO | Admitting: Hematology

## 2018-10-26 ENCOUNTER — Other Ambulatory Visit: Payer: Medicare HMO

## 2018-11-01 DIAGNOSIS — H2511 Age-related nuclear cataract, right eye: Secondary | ICD-10-CM | POA: Diagnosis not present

## 2018-11-01 DIAGNOSIS — H25812 Combined forms of age-related cataract, left eye: Secondary | ICD-10-CM | POA: Diagnosis not present

## 2018-11-01 DIAGNOSIS — H43813 Vitreous degeneration, bilateral: Secondary | ICD-10-CM | POA: Diagnosis not present

## 2018-11-01 DIAGNOSIS — H2512 Age-related nuclear cataract, left eye: Secondary | ICD-10-CM | POA: Diagnosis not present

## 2018-11-05 DIAGNOSIS — E785 Hyperlipidemia, unspecified: Secondary | ICD-10-CM | POA: Diagnosis not present

## 2018-11-05 DIAGNOSIS — I712 Thoracic aortic aneurysm, without rupture: Secondary | ICD-10-CM | POA: Diagnosis not present

## 2018-11-05 DIAGNOSIS — I10 Essential (primary) hypertension: Secondary | ICD-10-CM | POA: Diagnosis not present

## 2018-11-29 DIAGNOSIS — H2511 Age-related nuclear cataract, right eye: Secondary | ICD-10-CM | POA: Diagnosis not present

## 2018-11-29 DIAGNOSIS — H25811 Combined forms of age-related cataract, right eye: Secondary | ICD-10-CM | POA: Diagnosis not present

## 2018-11-30 ENCOUNTER — Other Ambulatory Visit: Payer: Self-pay | Admitting: *Deleted

## 2018-11-30 DIAGNOSIS — I712 Thoracic aortic aneurysm, without rupture, unspecified: Secondary | ICD-10-CM

## 2019-01-23 ENCOUNTER — Ambulatory Visit (HOSPITAL_COMMUNITY)
Admission: RE | Admit: 2019-01-23 | Discharge: 2019-01-23 | Disposition: A | Discharge status: Home / Self Care | Payer: Medicare HMO | Source: Ambulatory Visit | Attending: Hematology | Admitting: Hematology

## 2019-01-23 ENCOUNTER — Inpatient Hospital Stay: Payer: Medicare HMO | Attending: Hematology

## 2019-01-23 DIAGNOSIS — I82402 Acute embolism and thrombosis of unspecified deep veins of left lower extremity: Secondary | ICD-10-CM | POA: Diagnosis not present

## 2019-01-23 DIAGNOSIS — C772 Secondary and unspecified malignant neoplasm of intra-abdominal lymph nodes: Secondary | ICD-10-CM | POA: Insufficient documentation

## 2019-01-23 DIAGNOSIS — G620 Drug-induced polyneuropathy: Secondary | ICD-10-CM | POA: Insufficient documentation

## 2019-01-23 DIAGNOSIS — I712 Thoracic aortic aneurysm, without rupture, unspecified: Secondary | ICD-10-CM

## 2019-01-23 DIAGNOSIS — C187 Malignant neoplasm of sigmoid colon: Secondary | ICD-10-CM | POA: Diagnosis not present

## 2019-01-23 DIAGNOSIS — Z7901 Long term (current) use of anticoagulants: Secondary | ICD-10-CM | POA: Diagnosis not present

## 2019-01-23 DIAGNOSIS — I10 Essential (primary) hypertension: Secondary | ICD-10-CM | POA: Diagnosis not present

## 2019-01-23 DIAGNOSIS — K5730 Diverticulosis of large intestine without perforation or abscess without bleeding: Secondary | ICD-10-CM | POA: Diagnosis not present

## 2019-01-23 LAB — CMP (CANCER CENTER ONLY)
ALBUMIN: 3.9 g/dL (ref 3.5–5.0)
ALK PHOS: 114 U/L (ref 38–126)
ALT: 22 U/L (ref 0–44)
ANION GAP: 7 (ref 5–15)
AST: 21 U/L (ref 15–41)
BILIRUBIN TOTAL: 0.7 mg/dL (ref 0.3–1.2)
BUN: 12 mg/dL (ref 8–23)
CALCIUM: 9.1 mg/dL (ref 8.9–10.3)
CO2: 25 mmol/L (ref 22–32)
Chloride: 107 mmol/L (ref 98–111)
Creatinine: 1.11 mg/dL (ref 0.61–1.24)
GFR, Est AFR Am: >60 mL/min (ref >60)
GFR, Estimated: >60 mL/min (ref >60)
GLUCOSE: 112 mg/dL — AB (ref 70–99)
Potassium: 4.1 mmol/L (ref 3.5–5.1)
Sodium: 139 mmol/L (ref 135–145)
TOTAL PROTEIN: 7.5 g/dL (ref 6.5–8.1)

## 2019-01-23 LAB — CBC WITH DIFFERENTIAL (CANCER CENTER ONLY)
Abs Immature Granulocytes: 0.02 10*3/uL (ref 0.00–0.07)
BASOS ABS: 0 10*3/uL (ref 0.0–0.1)
Basophils Relative: 1 %
EOS PCT: 5 %
Eosinophils Absolute: 0.3 10*3/uL (ref 0.0–0.5)
HEMATOCRIT: 46.8 % (ref 39.0–52.0)
HEMOGLOBIN: 15.9 g/dL (ref 13.0–17.0)
Immature Granulocytes: 0 %
LYMPHS ABS: 1.4 10*3/uL (ref 0.7–4.0)
LYMPHS PCT: 24 %
MCH: 30.1 pg (ref 26.0–34.0)
MCHC: 34 g/dL (ref 30.0–36.0)
MCV: 88.6 fL (ref 80.0–100.0)
MONO ABS: 0.6 10*3/uL (ref 0.1–1.0)
MONOS PCT: 11 %
NRBC: 0 % (ref 0.0–0.2)
Neutro Abs: 3.5 10*3/uL (ref 1.7–7.7)
Neutrophils Relative %: 59 %
Platelet Count: 156 10*3/uL (ref 150–400)
RBC: 5.28 MIL/uL (ref 4.22–5.81)
RDW: 13 % (ref 11.5–15.5)
WBC Count: 5.8 10*3/uL (ref 4.0–10.5)

## 2019-01-23 MED ORDER — IOPAMIDOL (ISOVUE-370) INJECTION 76%
INTRAVENOUS | Status: AC
  Filled 2019-01-23: qty 100

## 2019-01-23 MED ORDER — SODIUM CHLORIDE (PF) 0.9 % IJ SOLN
INTRAMUSCULAR | Status: AC
  Filled 2019-01-23: qty 50

## 2019-01-23 MED ORDER — IOPAMIDOL (ISOVUE-370) INJECTION 76%
100.0000 mL | Freq: Once | INTRAVENOUS | Status: AC | PRN
  Administered 2019-01-23: 100 mL via INTRAVENOUS

## 2019-01-23 NOTE — Progress Notes (Signed)
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 Watt Climes -An infiltrative non-obstructing small mass was found in the mid sigmoid colon. Mass was partially circumferential (involving one third of the lumen circumference). The mass measured 2 cm in length, diameter was 2 mm. No bleeding was present.   -external and internal hemorrhoids -diverticulosis in the sigmoid colon, in the descending colon, in the transverse colon, and at the hepatic flexure -one medium polyp in the mid transverse colon  -ten small polyps in the rectum, in the sigmoid colon, in the descending colon, and in the transverse colon -likely malignant tumor in the mid sigmoid colon -exam was otherwise normal     02/26/2018 Initial Biopsy    From colonoscopy: 1. LG intestine- transverse colon, descending, polyp -Tubular adenomas (8). Sessile serrated adenoma/polyp 2. LG intestine - sigmoid colon bx -INVASIVE WELL DIFFERENTIATED ADENOCARCINOMA 3. LG intestine -sigmoid colon, rectum, polyp -Tubular adenoma with high grade dysplasia. Hypoplastic polyps (3).    02/26/2018 Initial Diagnosis    Colon cancer ([place])    02/28/2018 Imaging    CT AP IMPRESSION: -No evidence of metastatic disease or other acute findings. -Colonic diverticulosis, without radiographic evidence of diverticulitis. -Mild  hepatic steatosis. -Mildly enlarged prostate.     03/07/2018 Tumor Marker    CEA 1.3 (pre-op)    03/08/2018 Surgery    PROCEDURE:  Procedure(s): LAPAROSCOPIC ASSISTED SIGMOID COLECTOMY ERAS PATHWAY  MOBILIZATION OF SPLENIC FLEXURE RIGID SIGMOIDOSCOPY    03/08/2018 Cancer Staging    Staging form: Colon and Rectum, AJCC 8th Edition - Pathologic stage from 03/08/2018: Stage IIIB (pT3, pN1b, cM0) - Signed by Alla Feeling, NP on 04/05/2018    03/08/2018 Pathology Results    Diagnosis 1. Colon, segmental resection for tumor, sigmoid, stitch marks distal - INVASIVE ADENOCARCINOMA, MODERATELY DIFFERENTIATED, SPANNING 2.4 CM. - TUMOR FOCALLY INVADES THROUGH MUSCULARIS PROPRIA. - RESECTION MARGIN IS NEGATIVE. - METASTATIC CARCINOMA IN TWO OF TWENTY LYMPH NODES (2/20). - DIVERTICULOSIS. - HYPERPLASTIC POLYP (X2). - SEE ONCOLOGY TABLE. -MMR normal     04/09/2018 Imaging    CT Chest WO Contrast 04/09/18 IMPRESSION: 1. Stable exam. Scattered bilateral tiny pulmonary nodules without interval change. No new or progressive pulmonary nodule or mass. Attention on follow-up may be warranted. 2. Ascending thoracic aorta measures up to 4.9 cm maximum diameter, increased from 4.6 cm on prior study. Ascending thoracic aortic aneurysm. Recommend semi-annual imaging followup by CTA or MRA and referral to cardiothoracic surgery if not already obtained. This recommendation follows 2010 ACCF/AHA/AATS/ACR/ASA/SCA/SCAI/SIR/STS/SVM Guidelines for the Diagnosis and Management of Patients With Thoracic Aortic Disease. Circulation. 2010; 121: X937-J696    04/13/2018 - 07/05/2018 Chemotherapy    Adjuvant CAPOX every 3 weeks. Oxaliplatin 163m/m2, 2000 mg Xeloda BID on days 1-14, every 21 days starting 04/13/18. Dose reduced for first cycle. Last oxaliplatin treatment 06/15/18 at reduced dose due to  neuropathy and fatigue, complete Xeloda on 06/29/18    06/04/2018 Genetic Testing    06/04/18 Testing did not reveal a  pathogenic mutation in any of the genes analyzed. A copy of the genetic test report will be scanned into Epic under the Media tab.  The genes analyzed were the 83 genes on [facility]'s Multi-Cancer panel (ALK, APC, ATM, AXIN2, BAP1, BARD1, BLM, BMPR1A, BRCA1, BRCA2, BRIP1, CASR, CDC73, CDH1, CDK4, CDKN1B, CDKN1C, CDKN2A, CEBPA, CHEK2, CTNNA1, DICER1, DIS3L2, EGFR, EPCAM, FH, FLCN, GATA2, GPC3, GREM1, HOXB13, HRAS, KIT, MAX, MEN1, MET, MITF, MLH1, MSH2, MSH3, MSH6, MUTYH, NBN, NF1, NF2, NTHL1, PALB2, PDGFRA, PHOX2B, PMS2, POLD1, POLE, POT1, PRKAR1A, PTCH1, PTEN, RAD50, RAD51C, RAD51D, RB1, RECQL4, RET, RUNX1, SDHA, SDHAF2, SDHB, SDHC, SDHD, SMAD4, SMARCA4, SMARCB1, SMARCE1, STK11, SUFU, TERC, TERT, TMEM127, TP53, TSC1, TSC2, VHL, WRN, WT1).     01/23/2019 Imaging    CT AP W Contrast 01/23/19  IMPRESSION: Status post partial left hemicolectomy. No evidence of recurrent or metastatic disease.  Additional stable ancillary findings as above. Dedicated CTA chest reported separately.   CT Angio Chest 01/23/19  IMPRESSION: 1. Stable aneurysmal disease of the ascending thoracic aorta measuring 4.7 cm in greatest diameter. No dissection. 2. Stable mild cardiac enlargement. 3. Stable small bilateral pulmonary nodules demonstrating stability since January, 2018. Aortic aneurysm NOS (ICD10-I71.9).      CURRENT THERAPY:  Surveillance   INTERVAL HISTORY:  Christian Weeks is here for a follow up of his colon cancer. He presents to the clinic today with his wife. He notes he feels better since completing treatment 6 months ago, except b/l numbness in fingers. He denies any residual effects. He has been working part time truck driving. He like to remain active. He denies any pain or bloating and notes normal bowel movement.      REVIEW OF SYSTEMS:   Constitutional: Denies fevers, chills or abnormal weight loss Eyes: Denies blurriness of vision Ears, nose, mouth, throat, and face: Denies mucositis or  sore throat Respiratory: Denies cough, dyspnea or wheezes Cardiovascular: Denies palpitation, chest discomfort or lower extremity swelling Gastrointestinal:  Denies nausea, heartburn or change in bowel habits Skin: Denies abnormal skin rashes Lymphatics: Denies new lymphadenopathy or easy bruising Neurological:Denies tingling or new weaknesses (+) residual numbness in b/l left fingers Behavioral/Psych: Mood is stable, no new changes  All other systems were reviewed with the patient and are negative.  MEDICAL HISTORY:  Past Medical History:  Diagnosis Date  . Arthritis   . Blood clot in vein 01/2016  . Colon cancer ([place])   . Colon polyps   . Degenerative joint disease   . Genetic testing 06/11/18   Multi-Cancer panel (83 genes) @ [facility] - No pathogenic mutations detected  . History of kidney stones age 46's  . Hypertension   . Hypothyroidism   . Mass of colon    sigmoid  . Stroke ([place]) 01/2016   speech difficulty for a few hours after knee replacement left  . Thoracic ascending aortic aneurysm ([facility])    4.6-4.7 cm followed by Dr Servando Snare- LOV- 08/2017-epic   . Wears dentures   . Wears glasses     SURGICAL HISTORY: Past Surgical History:  Procedure Laterality Date  . colonscopy  02/26/2018  . KNEE ARTHROPLASTY Left 01/15/2016   Procedure: COMPUTER ASSISTED TOTAL KNEE ARTHROPLASTY;  Surgeon: [name], MD;  Location: [facility];  Service: Orthopedics;  Laterality: Left;  . LAPAROSCOPIC SIGMOID COLECTOMY N/A 03/08/2018   Procedure: LAPAROSCOPIC ASSISTED SIGMOID COLECTOMY ERAS  PATHWAY;  Surgeon: Jovita Kussmaul, MD;  Location: [facility];  Service: General;  Laterality: N/A;  . MULTIPLE TOOTH EXTRACTIONS    . TOTAL KNEE ARTHROPLASTY Right 01/13/2017   Procedure: RIGHT TOTAL KNEE ARTHROPLASTY;  Surgeon: [name], MD;  Location: [facility];  Service: Orthopedics;  Laterality: Right;    I have reviewed the social history and family history with the patient and they are unchanged from  previous note.  ALLERGIES:  is allergic to poison oak extract.  MEDICATIONS:  Current Outpatient Medications  Medication Sig Dispense Refill  . acetaminophen (TYLENOL) 500 MG tablet Take 1,000 mg by mouth every 6 (six) hours as needed for moderate pain or headache.     . Cholecalciferol (VITAMIN D3) 3000 units TABS Take 3,000 Units by mouth daily.    Marland Kitchen levothyroxine (SYNTHROID, LEVOTHROID) 137 MCG tablet Take 137 mcg by mouth daily before breakfast.    . losartan (COZAAR) 100 MG tablet Take 0.5 tablets (50 mg total) by mouth daily. 90 tablet 3  . ondansetron (ZOFRAN) 8 MG tablet Take 1 tablet (8 mg total) by mouth 2 (two) times daily as needed for refractory nausea / vomiting. Start on day 3 after chemotherapy. 30 tablet 1  . prochlorperazine (COMPAZINE) 10 MG tablet Take 1 tablet (10 mg total) by mouth every 6 (six) hours as needed (Nausea or vomiting). 30 tablet 1   No current facility-administered medications for this visit.     PHYSICAL EXAMINATION: ECOG PERFORMANCE STATUS: 1 - Symptomatic but completely ambulatory  Vitals:   01/25/19 1116  BP: 130/90  Pulse: (!) 54  Resp: 17  Temp: (!) 97.4 F (36.3 C)  SpO2: 95%   Filed Weights   01/25/19 1116  Weight: 240 lb 12.8 oz (109.2 kg)    GENERAL:alert, no distress and comfortable SKIN: skin color, texture, turgor are normal, no rashes or significant lesions EYES: normal, Conjunctiva are pink and non-injected, sclera clear OROPHARYNX:no exudate, no erythema and lips, buccal mucosa, and tongue normal  NECK: supple, thyroid normal size, non-tender, without nodularity LYMPH:  no palpable lymphadenopathy in the cervical, axillary or inguinal LUNGS: clear to auscultation and percussion with normal breathing effort HEART: regular rate & rhythm and no murmurs and no lower extremity edema ABDOMEN:abdomen soft, non-tender and normal bowel sounds Musculoskeletal:no cyanosis of digits and no clubbing  NEURO: alert & oriented x 3 with  fluent speech, no focal motor/sensory deficits  LABORATORY DATA:  I have reviewed the data as listed CBC Latest Ref Rng & Units 01/23/2019 10/23/2018 07/27/2018  WBC 4.0 - 10.5 K/uL 5.8 5.4 4.7  Hemoglobin 13.0 - 17.0 g/dL 15.9 15.5 14.0  Hematocrit 39.0 - 52.0 % 46.8 46.1 41.2  Platelets 150 - 400 K/uL 156 144(L) 146     CMP Latest Ref Rng & Units 01/23/2019 10/23/2018 07/27/2018  Glucose 70 - 99 mg/dL 112(H) 95 95  BUN 8 - 23 mg/dL '12 13 13  ' Creatinine 0.61 - 1.24 mg/dL 1.11 1.09 1.07  Sodium 135 - 145 mmol/L 139 140 138  Potassium 3.5 - 5.1 mmol/L 4.1 4.4 4.3  Chloride 98 - 111 mmol/L 107 109 108  CO2 22 - 32 mmol/L 25 24 21(L)  Calcium 8.9 - 10.3 mg/dL 9.1 9.3 9.1  Total Protein 6.5 - 8.1 g/dL 7.5 7.3 7.2  Total Bilirubin 0.3 - 1.2 mg/dL 0.7 0.6 0.7  Alkaline Phos 38 - 126 U/L 114 129(H) 131(H)  AST 15 - 41 U/L 21 32 33  ALT 0 -  44 U/L '22 26 21      ' RADIOGRAPHIC STUDIES: I have personally reviewed the radiological images as listed and agreed with the findings in the report. No results found.   ASSESSMENT & PLAN:  EDISON NICHOLSON is a 68 y.o. male with   1. Sigmoid colon adenocarcinoma, well differentiated, pT3N1b,M0, stage IIIB, MMR-normal -He was diagnosed in 02/2018. He is s/p sigmoid hemicolectomy and adjuvant chemo with CAPOX.  -He has recovered very well from chemotherapy except mild residual numbness in his fingers  -We reviewed his CT CAP from 01/23/19 which shows no evidence of disease or metastasis. He does have incidental findings of small right kidney cysts, likely benign. He also has enlarged prostate, asymptomatic. He also has arthrosclerosis.  I reviewed the scan image myself, and discussed with patient and his wife. -He is clinically doing well. Labs reviewed from 01/23/19, CBC and CMP is WNL except Glucose at 112.  -I discussed colon cancer surveillance with f/u, labs and exam every 3-4 months for first year, then every 6-12 months to complete 5 year surveillance.  Will continue Surveillance CT.  -He will have colonoscopy with Dr. Theodoro Clock in 03/2019  -F/u in 3 months    2. Genetics  -given he had more than 10 polyps on colonoscopy, he qualifies for genetics referral. He has 2 children.  -His 06/04/18 genetic testing was negative for mutations. I previously recommend his children have age appropriate colonoscopies.   3. HTN, thoracic aneurysm -Stable 4.6 ascending thoracic aortic aneurysm followed by Dr. Servando Snare  -BP well controlled on losartan per PCP   4. 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 On Eliquis from 01/2016 - 08/2017. No recurrent edema. Followed by PCP and neuro.  -I previously encouraged him to remain active as chemotherapy can increase his risk for blood clots.  5.  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  -Continue monitoring   PLAN: -CT CAP reviewed and NED  -Lab and f/u in 3 months  -He will have surveillance colonoscopy with Dr. Watt Climes in March, he will call for appointment.   No problem-specific Assessment & Plan notes found for this encounter.   No orders of the defined types were placed in this encounter.  All questions were answered. The patient knows to call the clinic with any problems, questions or concerns. No barriers to learning was detected. I spent 20 minutes counseling the patient face to face. The total time spent in the appointment was 25 minutes and more than 50% was on counseling and review of test results     Truitt Merle, MD 01/25/2019   I, Joslyn Devon, am acting as scribe for Truitt Merle, MD.   I have reviewed the above documentation for accuracy and completeness, and I agree with the above.

## 2019-01-24 ENCOUNTER — Encounter: Payer: Medicare HMO | Admitting: Cardiothoracic Surgery

## 2019-01-25 ENCOUNTER — Telehealth: Payer: Self-pay | Admitting: Hematology

## 2019-01-25 ENCOUNTER — Inpatient Hospital Stay (HOSPITAL_BASED_OUTPATIENT_CLINIC_OR_DEPARTMENT_OTHER): Payer: Medicare HMO | Admitting: Hematology

## 2019-01-25 ENCOUNTER — Encounter: Payer: Self-pay | Admitting: Hematology

## 2019-01-25 VITALS — BP 130/90 | HR 54 | Temp 97.4°F | Resp 17 | Ht 72.0 in | Wt 240.8 lb

## 2019-01-25 DIAGNOSIS — C772 Secondary and unspecified malignant neoplasm of intra-abdominal lymph nodes: Secondary | ICD-10-CM | POA: Diagnosis not present

## 2019-01-25 DIAGNOSIS — C187 Malignant neoplasm of sigmoid colon: Secondary | ICD-10-CM | POA: Diagnosis not present

## 2019-01-25 DIAGNOSIS — I82402 Acute embolism and thrombosis of unspecified deep veins of left lower extremity: Secondary | ICD-10-CM

## 2019-01-25 DIAGNOSIS — Z7901 Long term (current) use of anticoagulants: Secondary | ICD-10-CM | POA: Diagnosis not present

## 2019-01-25 DIAGNOSIS — I712 Thoracic aortic aneurysm, without rupture: Secondary | ICD-10-CM | POA: Diagnosis not present

## 2019-01-25 DIAGNOSIS — I10 Essential (primary) hypertension: Secondary | ICD-10-CM | POA: Diagnosis not present

## 2019-01-25 DIAGNOSIS — G620 Drug-induced polyneuropathy: Secondary | ICD-10-CM | POA: Diagnosis not present

## 2019-01-25 NOTE — Telephone Encounter (Signed)
Scheduled appt per 01/31 los.  Printed calendar and avs per patient request.

## 2019-01-28 ENCOUNTER — Encounter: Payer: Self-pay | Admitting: Cardiothoracic Surgery

## 2019-01-28 ENCOUNTER — Other Ambulatory Visit: Payer: Self-pay

## 2019-01-28 ENCOUNTER — Ambulatory Visit (INDEPENDENT_AMBULATORY_CARE_PROVIDER_SITE_OTHER): Payer: Medicare HMO | Admitting: Cardiothoracic Surgery

## 2019-01-28 VITALS — BP 112/79 | HR 64 | Resp 18 | Ht 72.0 in | Wt 242.4 lb

## 2019-01-28 DIAGNOSIS — I712 Thoracic aortic aneurysm, without rupture, unspecified: Secondary | ICD-10-CM

## 2019-01-28 NOTE — Progress Notes (Signed)
[address]       [place] [address]             [id number]                    Theron H [facility] [place] Medical Record #[id number] Date of Birth: [birth year]  Referring: [name], MD Primary Care: Deland Pretty, MD  Chief Complaint:    Chief Complaint  Patient presents with  . Thoracic Aortic Aneurysm    f/u with chest CT 01/23/2019    History of Present Illness:    Christian Weeks 68 y.o. male is seen in follow-up for dilated ascending aorta.  He returns today with a follow-up CT scan.   He has history  stage IIIb colon cancer.  He had a surgical resection with 2 out of 20 nodes positive.  He was treated with adjuvant CAPOX every 3 weeks. 2000 mg Xeloda BID on days 1-14, every 21 days .    For evaluation of his colon cancer noncontrast CT scan of the chest was done in April suggesting that his aorta was 4.9 cm.  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 This resolved fairly quickly. Echocardiogram did not confirm a patent foramen on bubble test, transcranial Doppler did. At the time the patient was noted to have a left lower extremity DVT. He notes he was anticoagulated for one month.   He returned in early 2018 to have the right knee replaced, on a preop chest x-ray it was suggested that he had a descending thoracic aneurysm. A CT of the chest was performed, there was no descending thoracic aneurysm there was mild dilatation of the ascending aorta, 4.7 cm. echocardiogram showed a trileaflet  aortic valve without stenosis or insufficiency.  The patient has no family history of aortic aneurysm, Marfan's or other connective tissue disorder, there is no family history of young family members dying suddenly without known cause. Patient notes his mother died of congestive heart failure at age 31 father died at age 32 possibly from a myocardial infarction. One sister died at age 105 with severe COPD, he has to other brothers 35 and 80 and a sister age 26.  His older brother died several weeks ago of dementia.   Current Activity/ Functional Status:  Patient is independent with mobility/ambulation, transfers, ADL's, IADL's.   Zubrod Score: At the time of surgery this patient's most appropriate activity status/level should be described as: []     0    Normal activity, no symptoms [x]     1    Restricted in physical strenuous activity but ambulatory, able to do out light work []     2    Ambulatory and capable of self care, unable to do work activities, up and about               >50 % of waking hours                              []   3    Only limited self care, in bed greater than 50% of waking hours []     4    Completely disabled, no self care, confined to bed or chair []     5    Moribund   Past Medical History:  Diagnosis Date  . Arthritis   . Blood clot in vein 01/2016  . Colon cancer ([place])   . Colon polyps   . Degenerative joint disease   . Genetic testing 06/11/18   Multi-Cancer panel (83 genes) @ [facility] - No pathogenic mutations detected  . History of kidney stones age 77's  . Hypertension   . Hypothyroidism   . Mass of colon    sigmoid  . Stroke ([facility]) 01/2016   speech difficulty for a few hours after knee replacement left  . Thoracic ascending aortic aneurysm ([facility])    4.6-4.7 cm followed by Dr Servando Snare- LOV- 08/2017-epic   . Wears dentures   . Wears glasses     Past Surgical History:  Procedure Laterality Date  . colonscopy  02/26/2018  . KNEE ARTHROPLASTY Left  01/15/2016   Procedure: COMPUTER ASSISTED TOTAL KNEE ARTHROPLASTY;  Surgeon: [name], MD;  Location: [facility];  Service: Orthopedics;  Laterality: Left;  . LAPAROSCOPIC SIGMOID COLECTOMY N/A 03/08/2018   Procedure: LAPAROSCOPIC ASSISTED SIGMOID COLECTOMY ERAS PATHWAY;  Surgeon: Jovita Kussmaul, MD;  Location: [facility];  Service: General;  Laterality: N/A;  . MULTIPLE TOOTH EXTRACTIONS    . TOTAL KNEE ARTHROPLASTY Right 01/13/2017   Procedure: RIGHT TOTAL KNEE ARTHROPLASTY;  Surgeon: [name], MD;  Location: [facility];  Service: Orthopedics;  Laterality: Right;    Family History  Problem Relation Age of Onset  . Congestive Heart Failure Mother   . Diabetes Mother   . Thyroid disease Sister     Social History   Social History  . Marital status: Widowed- Patient's wife died in 2016-03-24     Spouse name: N/A  . Number of children: N/A  . Years of education: N/A   Occupational History  . Not on file.   Social History Main Topics  . Smoking status: Former Smoker    Quit date: 11/16/1996  . Smokeless tobacco: Never Used  . Alcohol use No  . Drug use: No  . Sexual activity: Not on file      Social History   Tobacco Use  Smoking Status Former Smoker  . Packs/day: 1.50  . Years: 30.00  . Pack years: 45.00  . Types: Cigarettes  . Last attempt to quit: 11/16/1996  . Years since quitting: 22.2  Smokeless Tobacco Never Used  Tobacco Comment   quit 25 yrs ago    Social History   Substance and Sexual Activity  Alcohol Use No     Allergies  Allergen Reactions  . Quinolones     Patient was warned about not using Cipro and similar antibiotics. Recent studies have raised concern that fluoroquinolone antibiotics could be associated with an increased risk of aortic aneurysm Fluoroquinolones have non-antimicrobial properties that might jeopardise the integrity of the extracellular matrix of the vascular wall In a  propensity score matched cohort study in [place], there was a 66%  increased rate of aortic aneurysm or dissection associated with oral fluoroquinolone use, compared wit  . Poison Oak Extract Rash    Current Outpatient Medications  Medication Sig Dispense Refill  . acetaminophen (TYLENOL) 500 MG tablet Take 1,000 mg by mouth every 6 (six) hours as needed for  moderate pain or headache.     . Cholecalciferol (VITAMIN D3) 3000 units TABS Take 3,000 Units by mouth daily.    Marland Kitchen levothyroxine (SYNTHROID, LEVOTHROID) 137 MCG tablet Take 137 mcg by mouth daily before breakfast.    . losartan (COZAAR) 100 MG tablet Take 0.5 tablets (50 mg total) by mouth daily. 90 tablet 3  . ondansetron (ZOFRAN) 8 MG tablet Take 1 tablet (8 mg total) by mouth 2 (two) times daily as needed for refractory nausea / vomiting. Start on day 3 after chemotherapy. 30 tablet 1  . prochlorperazine (COMPAZINE) 10 MG tablet Take 1 tablet (10 mg total) by mouth every 6 (six) hours as needed (Nausea or vomiting). 30 tablet 1   No current facility-administered medications for this visit.       Review of Systems:  Review of Systems  Constitutional: Negative.   HENT: Negative.   Eyes: Negative.   Respiratory: Negative.   Cardiovascular: Negative.   Gastrointestinal: Negative.   Genitourinary: Negative.   Musculoskeletal: Negative.   Skin: Negative.   Neurological: Negative.   Endo/Heme/Allergies: Negative.   Psychiatric/Behavioral: Negative.        Physical Exam: BP 112/79 (BP Location: Right Arm, Patient Position: Sitting, Cuff Size: Large)   Pulse 64   Resp 18   Ht 6' (1.829 m)   Wt 242 lb 6.4 oz (110 kg)   SpO2 95% Comment: RA  BMI 32.88 kg/m   PHYSICAL EXAMINATION:  General appearance: alert and cooperative Head: Normocephalic, without obvious abnormality, atraumatic Neck: no adenopathy, no carotid bruit, no JVD, supple, symmetrical, trachea midline and thyroid not enlarged, symmetric, no tenderness/mass/nodules Lymph nodes: Cervical, supraclavicular, and axillary nodes  normal. Resp: clear to auscultation bilaterally Back: symmetric, no curvature. ROM normal. No CVA tenderness. Cardio: regular rate and rhythm, S1, S2 normal, no murmur, click, rub or gallop GI: soft, non-tender; bowel sounds normal; no masses,  no organomegaly Extremities: extremities normal, atraumatic, no cyanosis or edema Neurologic: Grossly normal   Diagnostic Studies & Laboratory data:     Recent Radiology Findings: Ct Abdomen Pelvis W Contrast  Result Date: 01/23/2019 CLINICAL DATA:  Colon cancer, status post partial colon resection and chemotherapy EXAM: CT ABDOMEN AND PELVIS WITH CONTRAST TECHNIQUE: Multidetector CT imaging of the abdomen and pelvis was performed using the standard protocol following bolus administration of intravenous contrast. CONTRAST:  138mL ISOVUE-370 IOPAMIDOL (ISOVUE-370) INJECTION 76% COMPARISON:  02/28/2018 FINDINGS: Lower chest: Dedicated CTA chest described separately. Hepatobiliary: Liver is within normal limits. No suspicious/enhancing hepatic lesions. Gallbladder is unremarkable. No intrahepatic or extrahepatic ductal dilatation. Pancreas: Within normal limits. Spleen: In normal limits. Adrenals/Urinary Tract: Adrenal glands are within normal limits. 8 mm cyst in the anterior interpolar right kidney (series 7/image 36). Left kidney is within normal limits. No hydronephrosis. Bladder is underdistended but unremarkable. Stomach/Bowel: Stomach is within normal limits. No evidence of bowel obstruction. Normal appendix (series 7/image 47). Scattered duodenal diverticuli. Status post partial left hemicolectomy with suture line in the lower pelvis (series 7/image 72). Mild left colonic diverticulosis, without evidence of diverticulitis. Vascular/Lymphatic: No evidence of abdominal aortic aneurysm. Atherosclerotic calcifications of the abdominal aorta and branch vessels. No suspicious abdominopelvic lymphadenopathy. Reproductive: Prostatomegaly, with enlargement the  central gland, indenting the base the bladder. Other: No abdominopelvic ascites. Musculoskeletal: Mild degenerative changes the lumbar spine. IMPRESSION: Status post partial left hemicolectomy. No evidence of recurrent or metastatic disease. Additional stable ancillary findings as above. Dedicated CTA chest reported separately. Electronically Signed   By: Henderson Newcomer.D.  On: 01/23/2019 12:28   Ct Angio Chest Aorta W/cm &/or Wo/cm  Result Date: 01/23/2019 CLINICAL DATA:  History aneurysmal disease of the ascending thoracic aorta previously measuring approximately 4.6-4.7 cm by prior CTA. History of sigmoid colonic adenocarcinoma and status post resection and chemotherapy. EXAM: CT ANGIOGRAPHY CHEST WITH CONTRAST TECHNIQUE: Multidetector CT imaging of the chest was performed using the standard protocol during bolus administration of intravenous contrast. Multiplanar CT image reconstructions and MIPs were obtained to evaluate the vascular anatomy. CONTRAST:  158mL ISOVUE-370 IOPAMIDOL (ISOVUE-370) INJECTION 76% COMPARISON:  05/15/2018 and additional prior studies. FINDINGS: Cardiovascular: The aortic root remains normal in caliber at the level of the sinuses of Valsalva and measures approximately 3.6-3.7 cm. The ascending thoracic aorta shows stable aneurysmal disease measuring approximately 4.7 cm in greatest diameter. The proximal arch measures 3.9 cm and the distal arch 3.3 cm. The descending thoracic aorta measures 3 cm. No evidence of dissection. Proximal great vessels demonstrate stable and normal patency and normal branching anatomy. Stable mild cardiac enlargement. No pericardial fluid identified. No significant calcified coronary artery plaque identified. Central pulmonary arteries are normal in caliber. Mediastinum/Nodes: No enlarged mediastinal, hilar, or axillary lymph nodes. Thyroid gland, trachea, and esophagus demonstrate no significant findings. Lungs/Pleura: Stable 3 mm subpleural inferior  right upper lobe nodule, 6 mm nodularity along the minor fissure, 6 mm subpleural inferior lingular nodule and 5 mm medial left upper lobe nodule. No new pulmonary nodules. There is no evidence of pulmonary edema, consolidation, pneumothorax or pleural fluid. Upper Abdomen: No acute abnormality. Musculoskeletal: No chest wall abnormality. No acute or significant osseous findings. Review of the MIP images confirms the above findings. IMPRESSION: 1. Stable aneurysmal disease of the ascending thoracic aorta measuring 4.7 cm in greatest diameter. No dissection. 2. Stable mild cardiac enlargement. 3. Stable small bilateral pulmonary nodules demonstrating stability since January, 2018. Aortic aneurysm NOS (ICD10-I71.9). Electronically Signed   By: Aletta Edouard M.D.   On: 01/23/2019 11:40   I have independently reviewed the above radiology studies  and reviewed the findings with the patient.    Ct Angio Chest Aorta W &/or Wo Contrast  Result Date: 05/15/2018 CLINICAL DATA:  Thoracic aortic aneurysm without rupture. EXAM: CT ANGIOGRAPHY CHEST WITH CONTRAST TECHNIQUE: Multidetector CT imaging of the chest was performed using the standard protocol during bolus administration of intravenous contrast. Multiplanar CT image reconstructions and MIPs were obtained to evaluate the vascular anatomy. CONTRAST:  75 mL of Isovue 370 intravenously. COMPARISON:  CT scan of April 09, 2018 and September 21, 2017. FINDINGS: Cardiovascular: 4.7 cm ascending thoracic aortic aneurysm is noted. No dissection is noted. Great vessels are widely patent. Transverse thoracic aorta measures 3.2 cm. Proximal descending thoracic aorta measures 2.7 cm. Normal pericardial effusion is noted. Mediastinum/Nodes: No enlarged mediastinal, hilar, or axillary lymph nodes. Thyroid gland, trachea, and esophagus demonstrate no significant findings. Lungs/Pleura: Lungs are clear. No pleural effusion or pneumothorax. Upper Abdomen: No acute abnormality.  Musculoskeletal: No chest wall abnormality. No acute or significant osseous findings. Review of the MIP images confirms the above findings. IMPRESSION: 4.7 cm ascending thoracic aortic aneurysm. Ascending thoracic aortic aneurysm. Recommend semi-annual imaging followup by CTA or MRA and referral to cardiothoracic surgery if not already obtained. This recommendation follows 2010 ACCF/AHA/AATS/ACR/ASA/SCA/SCAI/SIR/STS/SVM Guidelines for the Diagnosis and Management of Patients With Thoracic Aortic Disease. Circulation. 2010; 121: L798-X211. Electronically Signed   By: Marijo Conception, M.D.   On: 05/15/2018 14:40    Ct Angio Chest Aorta W/cm &/or Wo/cm  Result Date: 09/21/2017 CLINICAL DATA:  Follow-up thoracic aortic aneurysm EXAM: CT ANGIOGRAPHY CHEST WITH CONTRAST TECHNIQUE: Multidetector CT imaging of the chest was performed using the standard protocol during bolus administration of intravenous contrast. Multiplanar CT image reconstructions and MIPs were obtained to evaluate the vascular anatomy. CONTRAST:  80 cc Isovue 370 IV COMPARISON:  01/14/2017 FINDINGS: Cardiovascular: Stable ascending thoracic aorta, measuring up to 4.6 cm compared to 4.7 cm previously. No evidence of dissection. Heart is borderline in size. Scattered aortic and coronary artery calcifications. Mediastinum/Nodes: No mediastinal, hilar, or axillary adenopathy. Lungs/Pleura: Linear areas of scarring in the lingula. Lingular nodule measures 5 mm on image 78, stable. Nodule along the right minor fissure measures 5 mm on image 67, stable slight nodularity along the left major fissure on image 59 is stable. Left upper lobe nodule on image 54 measures 5 mm, stable. No new or enlarging pulmonary nodules. No pleural effusions. Upper Abdomen: Imaging into the upper abdomen shows no acute findings. Musculoskeletal: Chest wall soft tissues are unremarkable. No acute bony abnormality. Review of the MIP images confirms the above findings. IMPRESSION:  4.6 cm ascending thoracic aortic aneurysm, stable. Recommend semi-annual imaging followup by CTA or MRA and referral to cardiothoracic surgery if not already obtained. This recommendation follows 2010 ACCF/AHA/AATS/ACR/ASA/SCA/SCAI/SIR/STS/SVM Guidelines for the Diagnosis and Management of Patients With Thoracic Aortic Disease. Circulation. 2010; 121: W737-T062 Scattered small pulmonary nodules are stable. No follow-up needed if patient is low-risk (and has no known or suspected primary neoplasm). Non-contrast chest CT can be considered in 12 months if patient is high-risk. This recommendation follows the consensus statement: Guidelines for Management of Incidental Pulmonary Nodules Detected on CT Images: From the Fleischner Society 2017; Radiology 2017; 284:228-243. Electronically Signed   By: Rolm Baptise M.D.   On: 09/21/2017 15:20        CLINICAL DATA:  Unexplained retrocardiac density on a recent lateral chest radiograph view. Inpatient.  EXAM: CT CHEST WITH CONTRAST  TECHNIQUE: Multidetector CT imaging of the chest was performed during intravenous contrast administration.  CONTRAST:  75mL ISOVUE-300 IOPAMIDOL (ISOVUE-300) INJECTION 61%  COMPARISON:  01/04/2017 chest radiograph.  FINDINGS: Cardiovascular: Top-normal heart size. No significant pericardial fluid/thickening. Left anterior descending coronary atherosclerosis. Atherosclerotic and tortuous thoracic aorta with aneurysmal 4.7 cm maximum diameter ascending thoracic aorta. Normal caliber pulmonary arteries. No central pulmonary emboli.  Mediastinum/Nodes: Heterogeneous and atrophic appearing thyroid gland without discrete thyroid nodules. Unremarkable esophagus. No pathologically enlarged axillary, mediastinal or hilar lymph nodes.  Lungs/Pleura: No pneumothorax. No pleural effusion. There are four scattered solid pulmonary nodules, largest 5 mm in the right middle lobe associated with the minor fissure (series  3/image 79) and 5 mm in the lingula (series 3/ image 100). No acute consolidative airspace disease, lung masses or additional significant pulmonary nodules. Mild centrilobular and paraseptal emphysema.  Upper abdomen: Unremarkable.  Musculoskeletal: No aggressive appearing focal osseous lesions. Moderate thoracic spondylosis.  IMPRESSION: 1. Retrocardiac density on the lateral chest radiograph view correlates with focal tortuosity of the descending thoracic aorta. No lung masses. No consolidative airspace disease. 2. Four scattered solid pulmonary nodules, largest 5 mm. No follow-up needed if patient is low-risk (and has no known or suspected primary neoplasm). Non-contrast chest CT can be considered in 12 months if patient is high-risk. This recommendation follows the consensus statement: Guidelines for Management of Incidental Pulmonary Nodules Detected on CT Images: From the Fleischner Society 2017; Radiology 2017; 284:228-243. 3. Aortic atherosclerosis. Ascending thoracic aortic aneurysm, maximum diameter 4.7 cm. Ascending thoracic  aortic aneurysm. Recommend semi-annual imaging followup by CTA or MRA and referral to cardiothoracic surgery if not already obtained. This recommendation follows 2010 ACCF/AHA/AATS/ACR/ASA/SCA/SCAI/SIR/STS/SVM Guidelines for the Diagnosis and Management of Patients With Thoracic Aortic Disease. Circulation. 2010; 121: H607-P710. 4. Mild emphysema.      Recent Lab Findings: Lab Results  Component Value Date   WBC 5.8 01/23/2019   HGB 15.9 01/23/2019   HCT 46.8 01/23/2019   PLT 156 01/23/2019   GLUCOSE 112 (H) 01/23/2019   CHOL 141 02/05/2016   TRIG 300 (H) 02/05/2016   HDL 26 (L) 02/05/2016   LDLCALC 55 02/05/2016   ALT 22 01/23/2019   AST 21 01/23/2019   NA 139 01/23/2019   K 4.1 01/23/2019   CL 107 01/23/2019   CREATININE 1.11 01/23/2019   BUN 12 01/23/2019   CO2 25 01/23/2019   TSH 5.938 (H) 02/06/2016   INR 1.04 11/16/2016     HGBA1C 5.4 02/05/2016   ECHO:01/2016 LV EF: 50% -   55%  ------------------------------------------------------------------- Indications:      CVA 436.  ------------------------------------------------------------------- Study Conclusions  - Left ventricle: The cavity size was normal. Wall thickness was   normal. Systolic function was normal. The estimated ejection   fraction was in the range of 50% to 55%. Wall motion was normal;   there were no regional wall motion abnormalities. Doppler   parameters are consistent with abnormal left ventricular   relaxation (grade 1 diastolic dysfunction). - Aortic root: The aortic root was mildly dilated. - Mitral valve: Calcified annulus.  Impressions:  - Technically difficult; LV function low normal to mildly reduced   (EF 50); saline microcavitation study with late bubbles felt to   be nondiagnostic; if clinically indicated, TEE would have greater   sensitivity for source of embolus.  Transthoracic echocardiography.  M-mode, complete 2D, spectral Doppler, and color Doppler.  Birthdate:  Patient birthdate: 04/19/1951.  Age:  Patient is 68 yr old.  Sex:  Gender: male. BMI: 35.8 kg/m^2.  Blood pressure:     123/94  Patient status: Inpatient.  Study date:  Study date: 02/07/2016. Study time: 10:04 AM.  Location:  Bedside.  -------------------------------------------------------------------  ------------------------------------------------------------------- Left ventricle:  The cavity size was normal. Wall thickness was normal. Systolic function was normal. The estimated ejection fraction was in the range of 50% to 55%. Wall motion was normal; there were no regional wall motion abnormalities. Doppler parameters are consistent with abnormal left ventricular relaxation (grade 1 diastolic dysfunction).  ------------------------------------------------------------------- Aortic valve:   Trileaflet; mildly calcified leaflets.  Mobility was not restricted.  Doppler:  Transvalvular velocity was within the normal range. There was no stenosis. There was no regurgitation.   ------------------------------------------------------------------- Aorta:  Aortic root: The aortic root was mildly dilated.  ------------------------------------------------------------------- Mitral valve:   Calcified annulus. Mobility was not restricted. Doppler:  Transvalvular velocity was within the normal range. There was no evidence for stenosis. There was no regurgitation.  ------------------------------------------------------------------- Left atrium:  The atrium was normal in size.  ------------------------------------------------------------------- Right ventricle:  The cavity size was normal. Systolic function was normal.  ------------------------------------------------------------------- Pulmonic valve:    Doppler:  Transvalvular velocity was within the normal range. There was no evidence for stenosis.  ------------------------------------------------------------------- Tricuspid valve:   Structurally normal valve.    Doppler: Transvalvular velocity was within the normal range. There was trivial regurgitation.  ------------------------------------------------------------------- Pulmonary artery:   Systolic pressure was within the normal range.   ------------------------------------------------------------------- Right atrium:  The atrium was normal in size.  ------------------------------------------------------------------- Pericardium:  There was no pericardial effusion.  ------------------------------------------------------------------- Systemic veins: Inferior vena cava: The vessel was normal in size.  ------------------------------------------------------------------- Measurements   Left ventricle                         Value        Reference  LV ID, ED, PLAX chordal        (H)     52.3  mm     43 -  52  LV ID, ES, PLAX chordal        (H)     43.2  mm     23 - 38  LV fx shortening, PLAX chordal (L)     17    %      >=29  LV PW thickness, ED                    10.2  mm     ---------  IVS/LV PW ratio, ED                    1.04         <=1.3  LV ejection fraction, 1-p A4C          47    %      ---------  LV e&', lateral                         12.4  cm/s   ---------  LV E/e&', lateral                       4.73         ---------  LV e&', medial                          6.85  cm/s   ---------  LV E/e&', medial                        8.57         ---------  LV e&', average                         9.63  cm/s   ---------  LV E/e&', average                       6.1          ---------    Ventricular septum                     Value        Reference  IVS thickness, ED                      10.6  mm     ---------    LVOT                                   Value        Reference  LVOT ID, S                             23    mm     ---------  LVOT  area                              4.15  cm^2   ---------    Aorta                                  Value        Reference  Aortic root ID, ED                     38    mm     ---------    Left atrium                            Value        Reference  LA ID, A-P, ES                         38    mm     ---------  LA ID/bsa, A-P                         1.51  cm/m^2 <=2.2  LA volume, ES, 1-p A4C                 45.9  ml     ---------  LA volume/bsa, ES, 1-p A4C             18.3  ml/m^2 ---------    Mitral valve                           Value        Reference  Mitral E-wave peak velocity            58.7  cm/s   ---------  Mitral A-wave peak velocity            67.4  cm/s   ---------  Mitral deceleration time       (H)     313   ms     150 - 230  Mitral E/A ratio, peak                 0.9          ---------    Systemic veins                         Value        Reference  Estimated CVP                          3     mm Hg  ---------    Right  ventricle                        Value        Reference  TAPSE                                  15.3  mm     ---------  RV s&', lateral, S  11.7  cm/s   ---------  Legend: (L)  and  (H)  mark values outside specified reference range.  ------------------------------------------------------------------- Prepared and Electronically Authenticated by  Kirk Ruths 2017-02-12T15:08:13    *[facility]*                         [address], [place] [address]                            [phone]  ------------------------------------------------------------------- Noninvasive Vascular Lab  Transcranial Duplex Study  Patient:    Christian Weeks, Christian Weeks MR #:       [id number] Study Date: 02/07/2016 Gender:     M Age:        49 Height: Weight: BSA: Pt. Status: Room:       [id number]   ATTENDING    Eliezer Bottom [id number]  SONOGRAPHER  Landry Mellow, RVT, RDMS  Rachel Bo  Teressa Senter  Reports also to:  ------------------------------------------------------------------- History and indications:  Indications  Stroke patient with lower extremity DVT.  ------------------------------------------------------------------- Study information:  Study status:  Routine.  Procedure:  A vascular evaluation was performed. The right ICA, right ophthalmic, right middle cerebral, right anterior cerebral, right posterior cerebral, left ICA, left ophthalmic, left middle cerebral, left anterior cerebral, and left posterior cerebral arteries were studied. Image quality was excellent.    Transcranial duplex study.  Birthdate:  Patient birthdate: 1951/08/19.  Age:  Patient is 68 yr old.  Sex:  Gender: male.  Study date:  Study date: 02/07/2016. Study time: 02:36 PM. Location:  Vascular laboratory.  Patient status:  Inpatient.     ------------------------------------------------------------------- Summary:  - Transcranial Doppler Bubble study.   Performed by Dr. Erlinda Hong.     Verbal consent taken and risks/ benefits explained.     Left forearm IV was used for procedure.   Right Middle Cerebral artery was insonated.     HITS heard at rest: Spencer degree I   HITS heard during valsalva: Spencer degree IV -V     PFO size: small size at rest, large size during valsalva. - Postive Transcranial Doppler study indicative of a large right to   left intracardiac shunt with valsalava manouvre.  Prepared and Electronically Authenticated by  Antony Contras MD    Aortic Size Index=     4.7    /Body surface area is 2.36 meters squared. =  2  < 2.75 cm/m2      4% risk per year 2.75 to 4.25          8% risk per year > 4.25 cm/m2    20% risk per year     Assessment / Plan:  #1 aortic atherosclerosis 4.7 cm ascending thoracic aortic aneurysm  associated with a trileaflet aortic valve by echocardiogram, without evidence of aortic insufficiency or aortic stenosis.  CTA shows a stable.  #2 stroke without residual 01/2016  #3 PFO- by transcranial doppler  #4 history of left leg DVT #5 bilateral knee replacements    Patient was warned about not using Cipro  and similar antibiotics. Recent studies have raised concern that fluoroquinolone antibiotics could be associated with an increased risk of aortic aneurysm Fluoroquinolones have non-antimicrobial properties that might jeopardise the integrity of the extracellular matrix of the vascular wall In a  propensity score matched cohort study in [place], there was a 66% increased rate of aortic aneurysm or dissection associated with oral fluoroquinolone use, compared with amoxicillin use, within a 60 day risk period from start of treatment  We will plan to see the patient back in 12 months with a follow-up CTA of the chest.  We again reviewed the need to have good blood pressure  control, avoid strenuous lifting and avoid quinolones.   Grace Isaac MD      [place] [place] [address] Office [phone]   Beeper [phone]  01/28/2019 5:04 PM

## 2019-01-28 NOTE — Patient Instructions (Signed)

## 2019-03-24 IMAGING — CT CT ANGIO CHEST
2 of 6 series · 13 of 36 positions shown · IV contrast (isovue)
Comparison: CT scan of April 09, 2018 and September 21, 2017.

CLINICAL DATA: Thoracic aortic aneurysm without rupture.

EXAM:
CT ANGIOGRAPHY CHEST WITH CONTRAST
TECHNIQUE: Multidetector CT imaging of the chest was performed using the
standard protocol during bolus administration of intravenous
contrast. Multiplanar CT image reconstructions and MIPs were
obtained to evaluate the vascular anatomy.
CONTRAST:  75 mL of Isovue 370 intravenously.

[Series 4: cta thorax 2.00 bv36 s3 ax axial arterial · axial · arterial · 0.66mm/px · z∈[+1852,+2160]mm · 12 of 184 slices shown]
[im 15/184  lung]
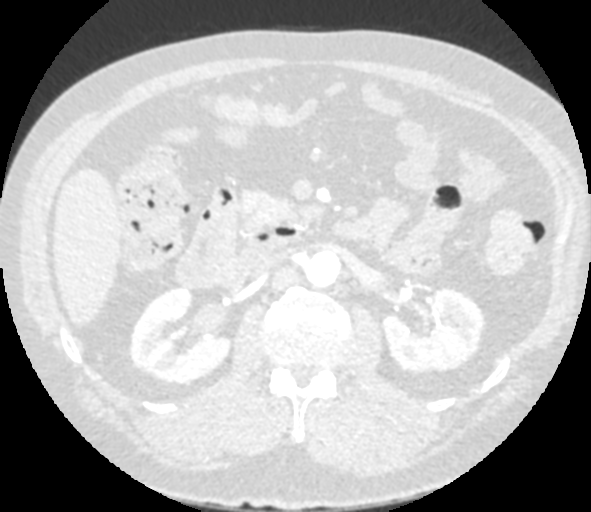
[im 29/184  mediastinal]
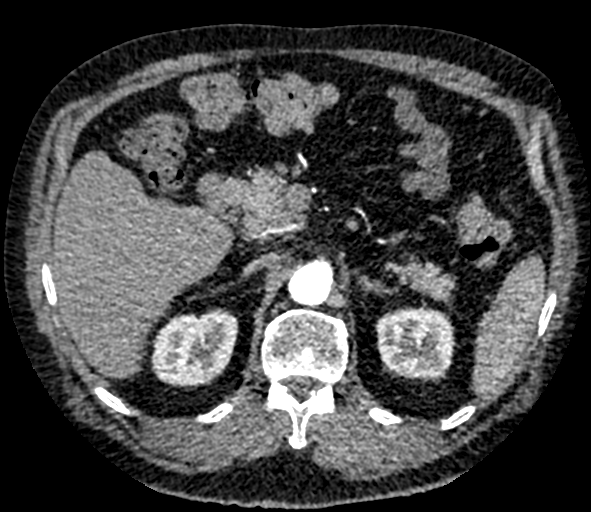
[im 43/184  lung]
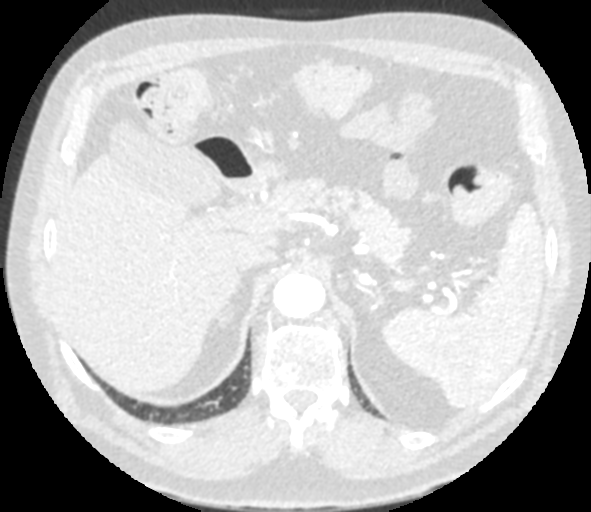
[im 57/184  mediastinal]
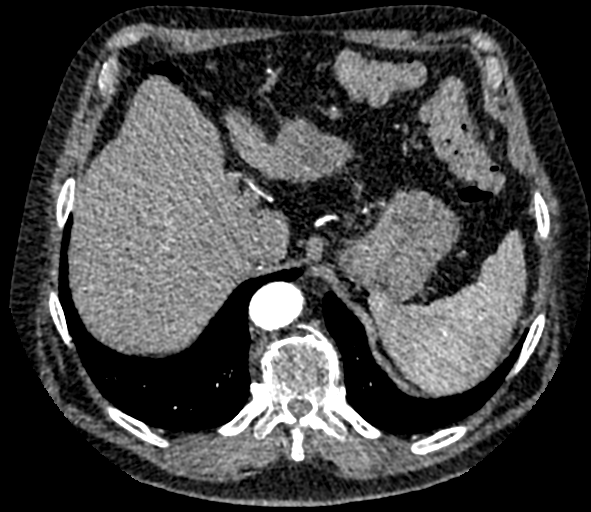
[im 71/184  lung]
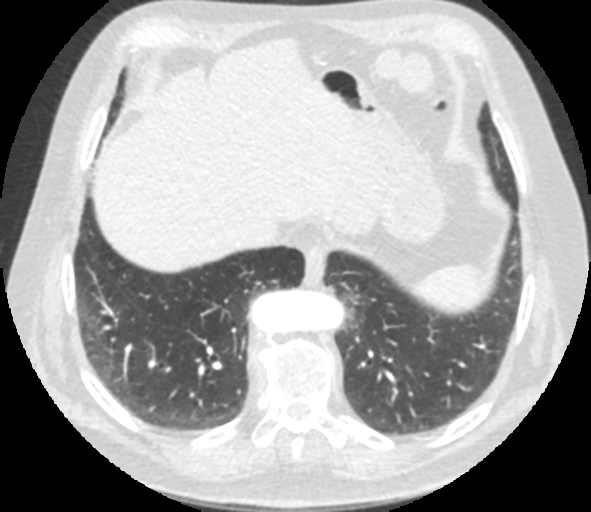
[im 85/184  mediastinal]
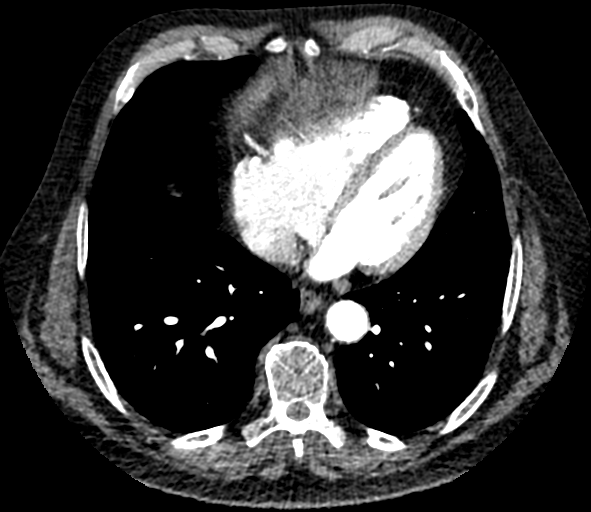
[im 99/184  lung]
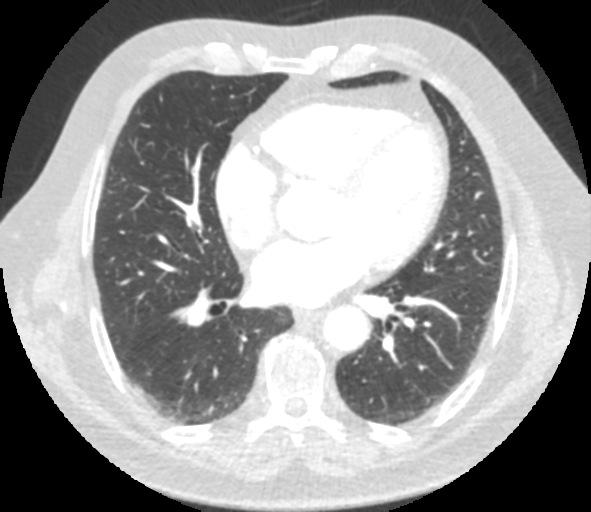
[im 113/184  mediastinal]
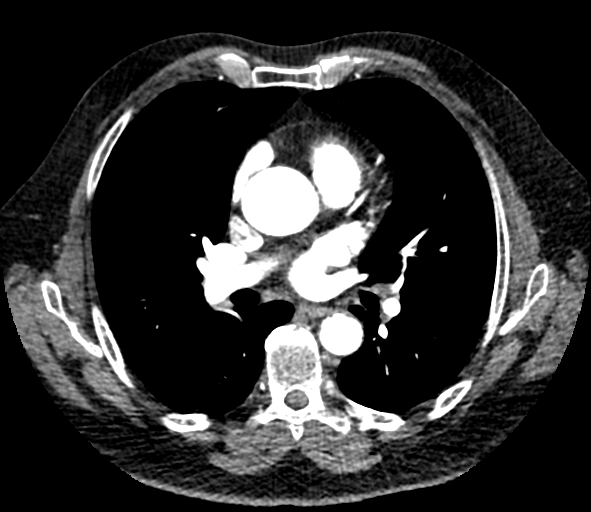
[im 127/184  lung]
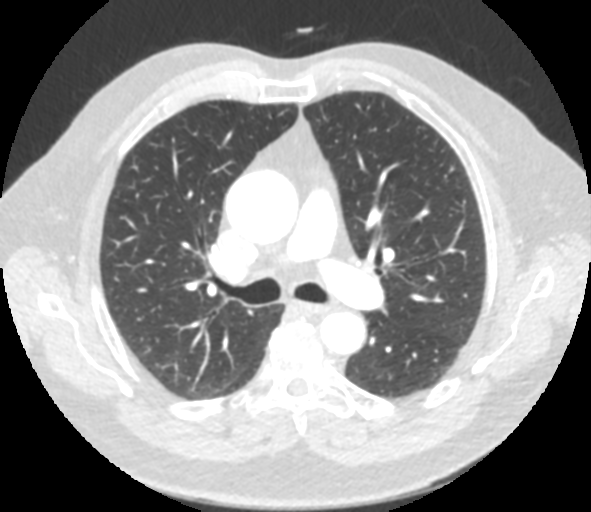
[im 141/184  mediastinal]
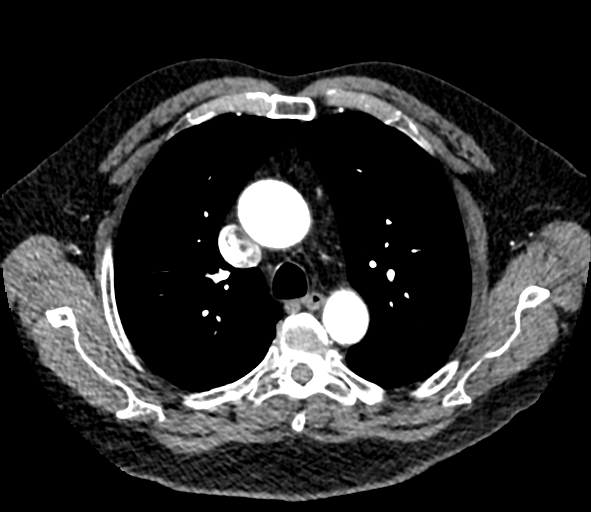
[im 155/184  lung]
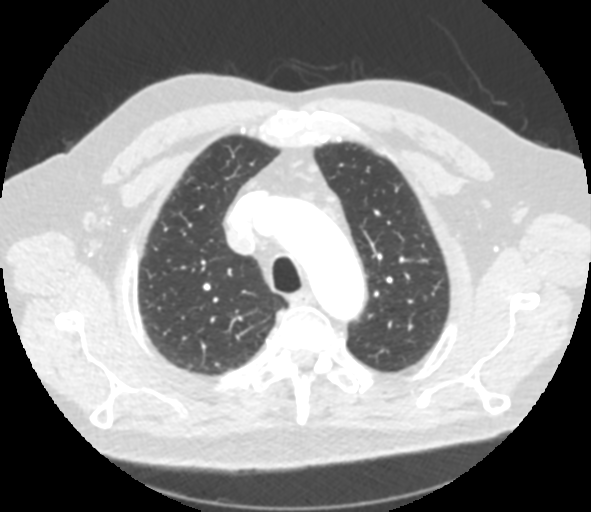
[im 169/184  mediastinal]
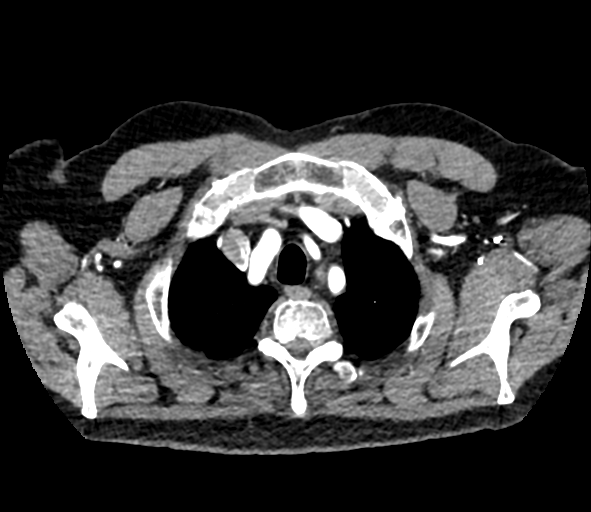

[Series 9: cta thorax 2.00 bv36 s3 cor cor st · coronal · 0.72mm/px · 1 of 168 slices shown]
[im 84/168  mediastinal]
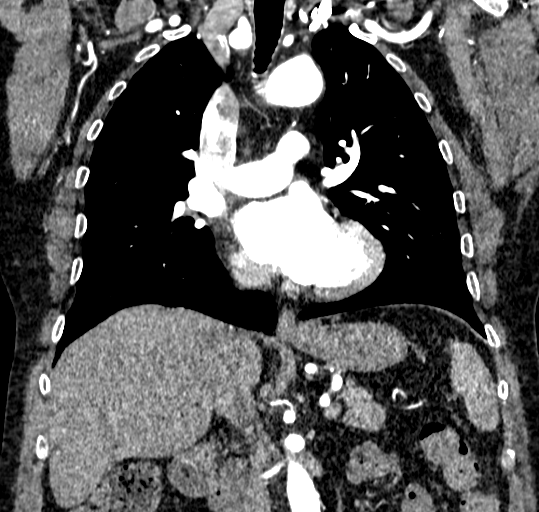

[13 of 36 positions shown; findings below may reference images not displayed]

FINDINGS: Cardiovascular: 4.7 cm ascending thoracic aortic aneurysm is noted.
No dissection is noted. Great vessels are widely patent. Transverse
thoracic aorta measures 3.2 cm. Proximal descending thoracic aorta
measures 2.7 cm. Normal pericardial effusion is noted.

Mediastinum/Nodes: No enlarged mediastinal, hilar, or axillary lymph
nodes. Thyroid gland, trachea, and esophagus demonstrate no
significant findings.

Lungs/Pleura: Lungs are clear. No pleural effusion or pneumothorax.

Upper Abdomen: No acute abnormality.

Musculoskeletal: No chest wall abnormality. No acute or significant
osseous findings.

Review of the MIP images confirms the above findings.
IMPRESSION: 4.7 cm ascending thoracic aortic aneurysm. Ascending thoracic aortic
aneurysm. Recommend semi-annual imaging followup by CTA or MRA and
referral to cardiothoracic surgery if not already obtained. This
recommendation follows 7535
ACCF/AHA/AATS/ACR/ASA/SCA/MINAMI/SIMONSEN/MUNIZ/INZUNZA Guidelines for the
Diagnosis and Management of Patients With Thoracic Aortic Disease.
Circulation. 7535; 121: e266-e369.

## 2019-04-04 ENCOUNTER — Encounter: Payer: Self-pay | Admitting: General Practice

## 2019-04-04 NOTE — Progress Notes (Signed)
[facility] Support Services   [facility] Support Services Team contacted patient to assess for food insecurity and other psychosocial needs during current COVID19 pandemic.    Patient/family expressed no needs at this time.  Support Team member encouraged patient to call if changes occur or they have any other questions/concerns.    Adrian Specht C Nygeria Lager, LCSW  [facility]        

## 2019-04-22 NOTE — Progress Notes (Signed)
[facility]   [phone] [phone]   Clinic Follow up Note   Patient Care Team: Deland Pretty, MD as PCP - General (Internal Medicine) Constance Haw, MD as PCP - Cardiology (Cardiology)   I connected with [name] on 04/25/2019 at 10:45 AM EDT by telephone visit and verified that I am speaking with the correct person using two identifiers.  I discussed the limitations, risks, security and privacy concerns of performing an evaluation and management service by telephone and the availability of in person appointments. I also discussed with the patient that there may be a patient responsible charge related to this service. The patient expressed understanding and agreed to proceed.   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 Watt Climes -An infiltrative non-obstructing small mass was found in the mid sigmoid colon. Mass was partially circumferential (involving one third of the lumen circumference). The mass measured 2 cm in length, diameter was 2 mm. No bleeding was present.   -external and internal hemorrhoids -diverticulosis in the sigmoid colon, in the descending colon, in the transverse colon, and at the hepatic flexure -one medium polyp in the mid transverse colon  -ten small polyps in the rectum, in the sigmoid colon, in the descending colon, and in the transverse colon -likely malignant tumor in the mid sigmoid colon -exam was otherwise normal     02/26/2018 Initial Biopsy    From colonoscopy: 1. LG  intestine- transverse colon, descending, polyp -Tubular adenomas (8). Sessile serrated adenoma/polyp 2. LG intestine - sigmoid colon bx -INVASIVE WELL DIFFERENTIATED ADENOCARCINOMA 3. LG intestine -sigmoid colon, rectum, polyp -Tubular adenoma with high grade dysplasia. Hypoplastic polyps (3).    02/26/2018 Initial Diagnosis    Colon cancer ([place])    02/28/2018 Imaging    CT AP IMPRESSION: -No evidence of metastatic disease or other acute findings. -Colonic diverticulosis, without radiographic evidence of diverticulitis. -Mild hepatic steatosis. -Mildly enlarged prostate.     03/07/2018 Tumor Marker    CEA 1.3 (pre-op)    03/08/2018 Surgery    PROCEDURE:  Procedure(s): LAPAROSCOPIC ASSISTED SIGMOID COLECTOMY ERAS PATHWAY  MOBILIZATION OF SPLENIC FLEXURE RIGID SIGMOIDOSCOPY    03/08/2018 Cancer Staging    Staging form: Colon and Rectum, AJCC 8th Edition - Pathologic stage from 03/08/2018: Stage IIIB (pT3, pN1b, cM0) - Signed by Alla Feeling, NP on 04/05/2018    03/08/2018 Pathology Results    Diagnosis 1. Colon, segmental resection for tumor, sigmoid, stitch marks distal - INVASIVE ADENOCARCINOMA, MODERATELY DIFFERENTIATED, SPANNING 2.4 CM. - TUMOR FOCALLY INVADES THROUGH MUSCULARIS PROPRIA. - RESECTION MARGIN IS NEGATIVE. - METASTATIC CARCINOMA IN TWO OF TWENTY LYMPH NODES (2/20). - DIVERTICULOSIS. - HYPERPLASTIC POLYP (X2). - SEE ONCOLOGY TABLE. -MMR normal     04/09/2018 Imaging    CT Chest WO Contrast 04/09/18 IMPRESSION: 1. Stable exam. Scattered bilateral tiny pulmonary nodules without interval change. No new or progressive pulmonary nodule or mass. Attention on follow-up may be warranted. 2. Ascending thoracic aorta measures up to 4.9 cm maximum diameter, increased from 4.6  cm on prior study. Ascending thoracic aortic aneurysm. Recommend semi-annual imaging followup by CTA or MRA and referral to cardiothoracic surgery if not already obtained. This recommendation  follows 2010 ACCF/AHA/AATS/ACR/ASA/SCA/SCAI/SIR/STS/SVM Guidelines for the Diagnosis and Management of Patients With Thoracic Aortic Disease. Circulation. 2010; 121: [id number]    04/13/2018 - 07/05/2018 Chemotherapy    Adjuvant CAPOX every 3 weeks. Oxaliplatin 160m/m2, 2000 mg Xeloda BID on days 1-14, every 21 days starting 04/13/18. Dose reduced for first cycle. Last oxaliplatin treatment 06/15/18 at reduced dose due to neuropathy and fatigue, complete Xeloda on 06/29/18    06/04/2018 Genetic Testing    06/04/18 Testing did not reveal a pathogenic mutation in any of the genes analyzed. A copy of the genetic test report will be scanned into Epic under the Media tab.  The genes analyzed were the 83 genes on [facility]'s Multi-Cancer panel (ALK, APC, ATM, AXIN2, BAP1, BARD1, BLM, BMPR1A, BRCA1, BRCA2, BRIP1, CASR, CDC73, CDH1, CDK4, CDKN1B, CDKN1C, CDKN2A, CEBPA, CHEK2, CTNNA1, DICER1, DIS3L2, EGFR, EPCAM, FH, FLCN, GATA2, GPC3, GREM1, HOXB13, HRAS, KIT, MAX, MEN1, MET, MITF, MLH1, MSH2, MSH3, MSH6, MUTYH, NBN, NF1, NF2, NTHL1, PALB2, PDGFRA, PHOX2B, PMS2, POLD1, POLE, POT1, PRKAR1A, PTCH1, PTEN, RAD50, RAD51C, RAD51D, RB1, RECQL4, RET, RUNX1, SDHA, SDHAF2, SDHB, SDHC, SDHD, SMAD4, SMARCA4, SMARCB1, SMARCE1, STK11, SUFU, TERC, TERT, TMEM127, TP53, TSC1, TSC2, VHL, WRN, WT1).     01/23/2019 Imaging    CT AP W Contrast 01/23/19  IMPRESSION: Status post partial left hemicolectomy. No evidence of recurrent or metastatic disease.  Additional stable ancillary findings as above. Dedicated CTA chest reported separately.   CT Angio Chest 01/23/19  IMPRESSION: 1. Stable aneurysmal disease of the ascending thoracic aorta measuring 4.7 cm in greatest diameter. No dissection. 2. Stable mild cardiac enlargement. 3. Stable small bilateral pulmonary nodules demonstrating stability since January, 2018. Aortic aneurysm NOS (ICD10-I71.9).      CURRENT THERAPY:  Surveillance   INTERVAL HISTORY:  JICHAEL PULLARAis here for a follow up of colon cancer. He  was able to identify himself by birth date. He notes he is doing well. He denies any new changes since last visit. He notes he is still working and taking COVID-19 precautions. He has stable and adequate energy and appetite. He is trying to lose or maintain weight by exercise. He denies any abdominal issues such pain, bloating or nausea.  He notes he has had to postpone his colonoscopy due to COVID-19. He will see cardiologist soon about AAA. He notes he monitors his BP at home and has been doing well.  He notes he still has residual neuropathy. He may occasionally small or thin items. This is manageable for him.     REVIEW OF SYSTEMS:   Constitutional: Denies fevers, chills or abnormal weight loss Eyes: Denies blurriness of vision Ears, nose, mouth, throat, and face: Denies mucositis or sore throat Respiratory: Denies cough, dyspnea or wheezes Cardiovascular: Denies palpitation, chest discomfort or lower extremity swelling Gastrointestinal:  Denies nausea, heartburn or change in bowel habits Skin: Denies abnormal skin rashes Lymphatics: Denies new lymphadenopathy or easy bruising Neurological:Denies new weaknesses (+) mild neuropathy in hand, manageable  Behavioral/Psych: Mood is stable, no new changes  All other systems were reviewed with the patient and are negative.  MEDICAL HISTORY:  Past Medical History:  Diagnosis Date  . Arthritis   . Blood clot in vein 01/2016  . [place] cancer [place]   . Colon polyps   . Degenerative joint disease   . Genetic testing 06/11/18  Multi-Cancer panel (83 genes) @ [facility] - No pathogenic mutations detected  . History of kidney stones age 41's  . Hypertension   . Hypothyroidism   . Mass of colon    sigmoid  . Stroke ([place]) 01/2016   speech difficulty for a few hours after knee replacement left  . Thoracic ascending aortic aneurysm ([facility])    4.6-4.7 cm followed by Dr Servando Snare- LOV- 08/2017-epic    . Wears dentures   . Wears glasses     SURGICAL HISTORY: Past Surgical History:  Procedure Laterality Date  . colonscopy  02/26/2018  . KNEE ARTHROPLASTY Left 01/15/2016   Procedure: COMPUTER ASSISTED TOTAL KNEE ARTHROPLASTY;  Surgeon: [name], MD;  Location: [facility];  Service: Orthopedics;  Laterality: Left;  . LAPAROSCOPIC SIGMOID COLECTOMY N/A 03/08/2018   Procedure: LAPAROSCOPIC ASSISTED SIGMOID COLECTOMY ERAS PATHWAY;  Surgeon: Jovita Kussmaul, MD;  Location: [facility];  Service: General;  Laterality: N/A;  . MULTIPLE TOOTH EXTRACTIONS    . TOTAL KNEE ARTHROPLASTY Right 01/13/2017   Procedure: RIGHT TOTAL KNEE ARTHROPLASTY;  Surgeon: [name], MD;  Location: [facility];  Service: Orthopedics;  Laterality: Right;    I have reviewed the social history and family history with the patient and they are unchanged from previous note.  ALLERGIES:  is allergic to quinolones and poison oak extract.  MEDICATIONS:  Current Outpatient Medications  Medication Sig Dispense Refill  . acetaminophen (TYLENOL) 500 MG tablet Take 1,000 mg by mouth every 6 (six) hours as needed for moderate pain or headache.     . Cholecalciferol (VITAMIN D3) 3000 units TABS Take 3,000 Units by mouth daily.    Marland Kitchen levothyroxine (SYNTHROID, LEVOTHROID) 137 MCG tablet Take 137 mcg by mouth daily before breakfast.    . losartan (COZAAR) 100 MG tablet Take 0.5 tablets (50 mg total) by mouth daily. 90 tablet 3  . ondansetron (ZOFRAN) 8 MG tablet Take 1 tablet (8 mg total) by mouth 2 (two) times daily as needed for refractory nausea / vomiting. Start on day 3 after chemotherapy. 30 tablet 1  . prochlorperazine (COMPAZINE) 10 MG tablet Take 1 tablet (10 mg total) by mouth every 6 (six) hours as needed (Nausea or vomiting). 30 tablet 1   No current facility-administered medications for this visit.     PHYSICAL EXAMINATION: ECOG PERFORMANCE STATUS: 0 - Asymptomatic  No vitals taken today, Exam not performed today  LABORATORY  DATA:  I have reviewed the data as listed CBC Latest Ref Rng & Units 01/23/2019 10/23/2018 07/27/2018  WBC 4.0 - 10.5 K/uL 5.8 5.4 4.7  Hemoglobin 13.0 - 17.0 g/dL 15.9 15.5 14.0  Hematocrit 39.0 - 52.0 % 46.8 46.1 41.2  Platelets 150 - 400 K/uL 156 144(L) 146     CMP Latest Ref Rng & Units 01/23/2019 10/23/2018 07/27/2018  Glucose 70 - 99 mg/dL 112(H) 95 95  BUN 8 - 23 mg/dL '12 13 13  ' Creatinine 0.61 - 1.24 mg/dL 1.11 1.09 1.07  Sodium 135 - 145 mmol/L 139 140 138  Potassium 3.5 - 5.1 mmol/L 4.1 4.4 4.3  Chloride 98 - 111 mmol/L 107 109 108  CO2 22 - 32 mmol/L 25 24 21(L)  Calcium 8.9 - 10.3 mg/dL 9.1 9.3 9.1  Total Protein 6.5 - 8.1 g/dL 7.5 7.3 7.2  Total Bilirubin 0.3 - 1.2 mg/dL 0.7 0.6 0.7  Alkaline Phos 38 - 126 U/L 114 129(H) 131(H)  AST 15 - 41 U/L 21 32 33  ALT 0 - 44  U/L '22 26 21      ' RADIOGRAPHIC STUDIES: I have personally reviewed the radiological images as listed and agreed with the findings in the report. No results found.   ASSESSMENT & PLAN:  Christian Weeks is a 69 y.o. male with   1. Sigmoid colon adenocarcinoma, well differentiated, pT3N1b,M0, stage IIIB, MMR-normal -He was diagnosed in 02/2018. He is s/p sigmoid hemicolectomy and adjuvant chemo with CAPOX.  -Hehas recovered very well from chemotherapyexceptmild residual numbness in his fingers  -He is clinically doing well and stable.  -I reviewed colon cancer surveillance with f/u, labs and exam every 3-4 months for first year, then every 6-12 months to complete 5 year surveillance. Will continue Surveillance CT.  -His colonoscopy with Dr. Theodoro Clock has been post-poned due to COVID-19 pandemic. He will schedule this year as soon as it's available  -F/u in 3 monthswith lab and exam   2. Genetics  -His 06/04/18 genetic testing was negative for mutations. I previously recommend his children have age appropriate colonoscopies.  3. HTN, thoracic aneurysm -Stable 4.6 ascending thoracic aortic aneurysm  followed by Dr. Servando Snare  -BP well controlled on losartan per PCP   4. 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 On Eliquis from 01/2016 - 08/2017. No recurrent edema. Followed by PCP and neuro.  -I previously encouraged him to remain active as chemotherapy can increase his risk for blood clots.  5. 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 Residual, stable. Continue monitoring -I discussed neuropathy can take time to recover and may not return to baseline. I suggest he use OTC B complex.   PLAN: -Clinically doing well and stable  -lab and f/u in 3 months     No problem-specific Assessment & Plan notes found for this encounter.   No orders of the defined types were placed in this encounter.  I discussed the assessment and treatment plan with the patient. The patient was provided an opportunity to ask questions and all were answered. The patient agreed with the plan and demonstrated an understanding of the instructions.  The patient was advised to call back or seek an in-person evaluation if the symptoms worsen or if the condition fails to improve as anticipated.  I provided 10 minutes of non face-to-face telephone visit time during this encounter, and > 50% was spent counseling as documented under my assessment & plan.    Truitt Merle, MD 04/25/2019   I, Joslyn Devon, am acting as scribe for Truitt Merle, MD.   I have reviewed the above documentation for accuracy and completeness, and I agree with the above.

## 2019-04-25 ENCOUNTER — Other Ambulatory Visit: Payer: Medicare HMO

## 2019-04-25 ENCOUNTER — Encounter: Payer: Self-pay | Admitting: Hematology

## 2019-04-25 ENCOUNTER — Inpatient Hospital Stay: Payer: Medicare HMO | Attending: Hematology | Admitting: Hematology

## 2019-04-25 ENCOUNTER — Telehealth: Payer: Self-pay | Admitting: Hematology

## 2019-04-25 DIAGNOSIS — C772 Secondary and unspecified malignant neoplasm of intra-abdominal lymph nodes: Secondary | ICD-10-CM

## 2019-04-25 DIAGNOSIS — C187 Malignant neoplasm of sigmoid colon: Secondary | ICD-10-CM | POA: Diagnosis not present

## 2019-04-25 NOTE — Telephone Encounter (Signed)
Scheduled appt per 4/30 los. ° °A calendar will be mailed out. °

## 2019-07-05 DIAGNOSIS — Z85038 Personal history of other malignant neoplasm of large intestine: Secondary | ICD-10-CM | POA: Diagnosis not present

## 2019-07-05 DIAGNOSIS — Z980 Intestinal bypass and anastomosis status: Secondary | ICD-10-CM | POA: Diagnosis not present

## 2019-07-05 DIAGNOSIS — D120 Benign neoplasm of cecum: Secondary | ICD-10-CM | POA: Diagnosis not present

## 2019-07-05 DIAGNOSIS — D128 Benign neoplasm of rectum: Secondary | ICD-10-CM | POA: Diagnosis not present

## 2019-07-05 DIAGNOSIS — Z86010 Personal history of colonic polyps: Secondary | ICD-10-CM | POA: Diagnosis not present

## 2019-07-05 DIAGNOSIS — K5730 Diverticulosis of large intestine without perforation or abscess without bleeding: Secondary | ICD-10-CM | POA: Diagnosis not present

## 2019-07-05 DIAGNOSIS — K635 Polyp of colon: Secondary | ICD-10-CM | POA: Diagnosis not present

## 2019-07-09 DIAGNOSIS — D128 Benign neoplasm of rectum: Secondary | ICD-10-CM | POA: Diagnosis not present

## 2019-07-09 DIAGNOSIS — K635 Polyp of colon: Secondary | ICD-10-CM | POA: Diagnosis not present

## 2019-07-09 DIAGNOSIS — D120 Benign neoplasm of cecum: Secondary | ICD-10-CM | POA: Diagnosis not present

## 2019-07-22 NOTE — Progress Notes (Signed)
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 Watt Climes -An infiltrative non-obstructing small mass was found in the mid sigmoid colon. Mass was partially circumferential (involving one third of the lumen circumference). The mass measured 2 cm in length, diameter was 2 mm. No bleeding was present.   -external and internal hemorrhoids -diverticulosis in the sigmoid colon, in the descending colon, in the transverse colon, and at the hepatic flexure -one medium polyp in the mid transverse colon  -ten small polyps in the rectum, in the sigmoid colon, in the descending colon, and in the transverse colon -likely malignant tumor in the mid sigmoid colon -exam was otherwise normal    02/26/2018 Initial Biopsy   From colonoscopy: 1. LG intestine- transverse colon, descending, polyp -Tubular adenomas (8). Sessile serrated adenoma/polyp 2. LG intestine - sigmoid colon bx -INVASIVE WELL DIFFERENTIATED ADENOCARCINOMA 3. LG intestine -sigmoid colon, rectum, polyp -Tubular adenoma with high grade dysplasia. Hypoplastic polyps (3).   02/26/2018 Initial Diagnosis   Colon cancer ([place])   02/28/2018 Imaging   CT AP IMPRESSION: -No evidence of metastatic disease or other acute findings. -Colonic diverticulosis, without radiographic evidence of diverticulitis. -Mild  hepatic steatosis. -Mildly enlarged prostate.    03/07/2018 Tumor Marker   CEA 1.3 (pre-op)   03/08/2018 Surgery   PROCEDURE:  Procedure(s): LAPAROSCOPIC ASSISTED SIGMOID COLECTOMY ERAS PATHWAY  MOBILIZATION OF SPLENIC FLEXURE RIGID SIGMOIDOSCOPY   03/08/2018 Cancer Staging   Staging form: Colon and Rectum, AJCC 8th Edition - Pathologic stage from 03/08/2018: Stage IIIB (pT3, pN1b, cM0) - Signed by Alla Feeling, NP on 04/05/2018   03/08/2018 Pathology Results   Diagnosis 1. Colon, segmental resection for tumor, sigmoid, stitch marks distal - INVASIVE ADENOCARCINOMA, MODERATELY DIFFERENTIATED, SPANNING 2.4 CM. - TUMOR FOCALLY INVADES THROUGH MUSCULARIS PROPRIA. - RESECTION MARGIN IS NEGATIVE. - METASTATIC CARCINOMA IN TWO OF TWENTY LYMPH NODES (2/20). - DIVERTICULOSIS. - HYPERPLASTIC POLYP (X2). - SEE ONCOLOGY TABLE. -MMR normal    04/09/2018 Imaging   CT Chest WO Contrast 04/09/18 IMPRESSION: 1. Stable exam. Scattered bilateral tiny pulmonary nodules without interval change. No new or progressive pulmonary nodule or mass. Attention on follow-up may be warranted. 2. Ascending thoracic aorta measures up to 4.9 cm maximum diameter, increased from 4.6 cm on prior study. Ascending thoracic aortic aneurysm. Recommend semi-annual imaging followup by CTA or MRA and referral to cardiothoracic surgery if not already obtained. This recommendation follows 2010 ACCF/AHA/AATS/ACR/ASA/SCA/SCAI/SIR/STS/SVM Guidelines for the Diagnosis and Management of Patients With Thoracic Aortic Disease. Circulation. 2010; 121: N989-Q119   04/13/2018 - 07/05/2018 Chemotherapy   Adjuvant CAPOX every 3 weeks. Oxaliplatin 116m/m2, 2000 mg Xeloda BID on days 1-14, every 21 days starting 04/13/18. Dose reduced for first cycle. Last oxaliplatin treatment 06/15/18 at reduced dose due to neuropathy and fatigue, complete Xeloda on 06/29/18   06/04/2018 Genetic Testing   06/04/18 Testing did not reveal a pathogenic  mutation in any of the genes analyzed. A copy of the genetic test report will be scanned into Epic under the Media tab.  The genes analyzed were the 83 genes on [facility]'s Multi-Cancer panel (ALK, APC, ATM, AXIN2, BAP1, BARD1, BLM, BMPR1A, BRCA1, BRCA2, BRIP1, CASR, CDC73, CDH1, CDK4, CDKN1B, CDKN1C, CDKN2A, CEBPA, CHEK2, CTNNA1, DICER1, DIS3L2, EGFR, EPCAM, FH, FLCN, GATA2, GPC3, GREM1, HOXB13, HRAS, KIT, MAX, MEN1, MET, MITF, MLH1, MSH2, MSH3, MSH6, MUTYH, NBN, NF1, NF2, NTHL1, PALB2, PDGFRA, PHOX2B, PMS2, POLD1, POLE, POT1, PRKAR1A, PTCH1, PTEN, RAD50, RAD51C, RAD51D, RB1, RECQL4, RET, RUNX1, SDHA, SDHAF2, SDHB, SDHC, SDHD, SMAD4, SMARCA4, SMARCB1, SMARCE1, STK11, SUFU, TERC, TERT, TMEM127, TP53, TSC1, TSC2, VHL, WRN, WT1).    01/23/2019 Imaging   CT AP W Contrast 01/23/19  IMPRESSION: Status post partial left hemicolectomy. No evidence of recurrent or metastatic disease.  Additional stable ancillary findings as above. Dedicated CTA chest reported separately.   CT Angio Chest 01/23/19  IMPRESSION: 1. Stable aneurysmal disease of the ascending thoracic aorta measuring 4.7 cm in greatest diameter. No dissection. 2. Stable mild cardiac enlargement. 3. Stable small bilateral pulmonary nodules demonstrating stability since January, 2018. Aortic aneurysm NOS (ICD10-I71.9).      CURRENT THERAPY:  Surveillance  INTERVAL HISTORY:  Christian Weeks is here for a follow up colon cancer. He presents to the clinic alone. He notes he is doing well. He notes no new changes. He denies pain or abdominal issues. He is eating well and energy is adequate. He notes with new change in job he works in the morning and has more times to be active. He notes his residual tingling of his finger tip now,starting to resolve some.    REVIEW OF SYSTEMS:   Constitutional: Denies fevers, chills or abnormal weight loss Eyes: Denies blurriness of vision Ears, nose, mouth, throat, and face: Denies mucositis or sore  throat Respiratory: Denies cough, dyspnea or wheezes Cardiovascular: Denies palpitation, chest discomfort or lower extremity swelling Gastrointestinal:  Denies nausea, heartburn or change in bowel habits Skin: Denies abnormal skin rashes Lymphatics: Denies new lymphadenopathy or easy bruising Neurological: (+) intermittent tingling of fingertips  Behavioral/Psych: Mood is stable, no new changes  All other systems were reviewed with the patient and are negative.  MEDICAL HISTORY:  Past Medical History:  Diagnosis Date  . Arthritis   . Blood clot in vein 01/2016  . Colon cancer ([place])   . Colon polyps   . Degenerative joint disease   . Genetic testing 06/11/18   Multi-Cancer panel (83 genes) @ [facility] - No pathogenic mutations detected  . History of kidney stones age 88's  . Hypertension   . Hypothyroidism   . Mass of colon    sigmoid  . Stroke ([place]) 01/2016   speech difficulty for a few hours after knee replacement left  . Thoracic ascending aortic aneurysm ([facility])    4.6-4.7 cm followed by Dr Servando Snare- LOV- 08/2017-epic   . Wears dentures   . Wears glasses     SURGICAL HISTORY: Past Surgical History:  Procedure Laterality Date  . colonscopy  02/26/2018  . KNEE ARTHROPLASTY Left 01/15/2016   Procedure: COMPUTER ASSISTED TOTAL KNEE ARTHROPLASTY;  Surgeon: [name], MD;  Location: [facility];  Service: Orthopedics;  Laterality: Left;  . LAPAROSCOPIC SIGMOID COLECTOMY N/A 03/08/2018   Procedure: LAPAROSCOPIC ASSISTED SIGMOID COLECTOMY ERAS PATHWAY;  Surgeon: Jovita Kussmaul, MD;  Location: [facility];  Service: General;  Laterality: N/A;  . MULTIPLE TOOTH EXTRACTIONS    . TOTAL  KNEE ARTHROPLASTY Right 01/13/2017   Procedure: RIGHT TOTAL KNEE ARTHROPLASTY;  Surgeon: [name], MD;  Location: [place];  Service: Orthopedics;  Laterality: Right;    I have reviewed the social history and family history with the patient and they are unchanged from previous note.  ALLERGIES:  is allergic to  quinolones and poison oak extract.  MEDICATIONS:  Current Outpatient Medications  Medication Sig Dispense Refill  . acetaminophen (TYLENOL) 500 MG tablet Take 1,000 mg by mouth every 6 (six) hours as needed for moderate pain or headache.     . Cholecalciferol (VITAMIN D3) 3000 units TABS Take 3,000 Units by mouth daily.    Marland Kitchen levothyroxine (SYNTHROID, LEVOTHROID) 137 MCG tablet Take 137 mcg by mouth daily before breakfast.     No current facility-administered medications for this visit.     PHYSICAL EXAMINATION: ECOG PERFORMANCE STATUS: 0 - Asymptomatic  Vitals:   07/25/19 1037  BP: 128/84  Pulse: (!) 55  Resp: 17  Temp: 98.7 F (37.1 C)  SpO2: 97%   Filed Weights   07/25/19 1037  Weight: 240 lb 3.2 oz (109 kg)    GENERAL:alert, no distress and comfortable SKIN: skin color, texture, turgor are normal, no rashes or significant lesions EYES: normal, Conjunctiva are pink and non-injected, sclera clear  NECK: supple, thyroid normal size, non-tender, without nodularity LYMPH:  no palpable lymphadenopathy in the cervical, axillary  LUNGS: clear to auscultation and percussion with normal breathing effort HEART: regular rate & rhythm and no murmurs and no lower extremity edema ABDOMEN:abdomen soft, non-tender and normal bowel sounds Musculoskeletal:no cyanosis of digits and no clubbing  NEURO: alert & oriented x 3 with fluent speech, no focal motor/sensory deficits  LABORATORY DATA:  I have reviewed the data as listed CBC Latest Ref Rng & Units 07/25/2019 01/23/2019 10/23/2018  WBC 4.0 - 10.5 K/uL 6.0 5.8 5.4  Hemoglobin 13.0 - 17.0 g/dL 16.0 15.9 15.5  Hematocrit 39.0 - 52.0 % 46.7 46.8 46.1  Platelets 150 - 400 K/uL 171 156 144(L)     CMP Latest Ref Rng & Units 07/25/2019 01/23/2019 10/23/2018  Glucose 70 - 99 mg/dL 95 112(H) 95  BUN 8 - 23 mg/dL '14 12 13  ' Creatinine 0.61 - 1.24 mg/dL 1.10 1.11 1.09  Sodium 135 - 145 mmol/L 141 139 140  Potassium 3.5 - 5.1 mmol/L 4.2 4.1  4.4  Chloride 98 - 111 mmol/L 109 107 109  CO2 22 - 32 mmol/L '23 25 24  ' Calcium 8.9 - 10.3 mg/dL 9.3 9.1 9.3  Total Protein 6.5 - 8.1 g/dL 7.1 7.5 7.3  Total Bilirubin 0.3 - 1.2 mg/dL 0.8 0.7 0.6  Alkaline Phos 38 - 126 U/L 106 114 129(H)  AST 15 - 41 U/L 19 21 32  ALT 0 - 44 U/L '17 22 26      ' RADIOGRAPHIC STUDIES: I have personally reviewed the radiological images as listed and agreed with the findings in the report. No results found.   ASSESSMENT & PLAN:  TILLMAN KAZMIERSKI is a 68 y.o. male with   1. Sigmoid colon adenocarcinoma, well differentiated, pT3N1b,M0, stage IIIB, MMR-normal -He was diagnosed in 02/2018. He is s/p sigmoidhemicolectomy and adjuvantchemo withCAPOX.  -Hehas recovered very well from chemotherapywith now mild residual intermittent tingling finger tips -I previously reviewed colon cancer surveillance with f/u, labs and exam every 3-4 months for first year, then every 6-12 months to complete 5 year surveillance. Will continue Surveillance CT. -He is clinically doing very well. Labs  reviewed, CBC and CMP WNL. CEA still pending. Physical exam unremarkable. There is no clinical concern for recurrence.  -He is almost 1.5 years since his diagnosis. I reviewed his risk of recurrence will significantly reduce after 3 years NED.  -Per patient he had colonoscopy with Dr. Watt Climes, I will request report from his office.  -F/u in 4-5 months. Will order next scan at that time to be done spring 2021.   2. Genetic Testing was negative   3. HTN, thoracic aneurysm -Stable 4.6 ascending thoracic aortic aneurysm followed by Dr. Servando Snare  -BP well controlled on losartan per PCP   4. 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 OnEliquisfrom 01/2016 - 08/2017. No recurrent edema. Followed by PCP and neuro.   -I previously encouraged him to remain active as chemotherapy can increase his risk for blood clots.  5. Peripheral  neuropathy, grade 1 -Secondary to prior chemotherapy and residual after completions. Continue monitoring -I discussed neuropathy can take time to recover and may not return to baseline. I suggest he use OTC B complex.  -Now minimal and intermittent tingling in his fingertips.   PLAN: -Clinically doing well and stable  -lab and f/u in 4-5 months  -Request colonoscopy report from Dr. Perley Jain office    No problem-specific Assessment & Plan notes found for this encounter.   No orders of the defined types were placed in this encounter.  All questions were answered. The patient knows to call the clinic with any problems, questions or concerns. No barriers to learning was detected. I spent 15 minutes counseling the patient face to face. The total time spent in the appointment was 20 minutes and more than 50% was on counseling and review of test results     Christian Merle, MD 07/25/2019   I, Joslyn Devon, am acting as scribe for Christian Merle, MD.   I have reviewed the above documentation for accuracy and completeness, and I agree with the above.

## 2019-07-25 ENCOUNTER — Inpatient Hospital Stay: Payer: Medicare HMO | Attending: Hematology

## 2019-07-25 ENCOUNTER — Telehealth: Payer: Self-pay | Admitting: Hematology

## 2019-07-25 ENCOUNTER — Other Ambulatory Visit: Payer: Self-pay

## 2019-07-25 ENCOUNTER — Inpatient Hospital Stay (HOSPITAL_BASED_OUTPATIENT_CLINIC_OR_DEPARTMENT_OTHER): Payer: Medicare HMO | Admitting: Hematology

## 2019-07-25 VITALS — BP 128/84 | HR 55 | Temp 98.7°F | Resp 17 | Ht 72.0 in | Wt 240.2 lb

## 2019-07-25 DIAGNOSIS — Z8673 Personal history of transient ischemic attack (TIA), and cerebral infarction without residual deficits: Secondary | ICD-10-CM

## 2019-07-25 DIAGNOSIS — I712 Thoracic aortic aneurysm, without rupture: Secondary | ICD-10-CM | POA: Diagnosis not present

## 2019-07-25 DIAGNOSIS — C187 Malignant neoplasm of sigmoid colon: Secondary | ICD-10-CM | POA: Diagnosis not present

## 2019-07-25 DIAGNOSIS — I10 Essential (primary) hypertension: Secondary | ICD-10-CM | POA: Diagnosis not present

## 2019-07-25 DIAGNOSIS — Z86718 Personal history of other venous thrombosis and embolism: Secondary | ICD-10-CM

## 2019-07-25 DIAGNOSIS — C772 Secondary and unspecified malignant neoplasm of intra-abdominal lymph nodes: Secondary | ICD-10-CM

## 2019-07-25 DIAGNOSIS — G620 Drug-induced polyneuropathy: Secondary | ICD-10-CM | POA: Diagnosis not present

## 2019-07-25 DIAGNOSIS — Z7901 Long term (current) use of anticoagulants: Secondary | ICD-10-CM

## 2019-07-25 LAB — CMP (CANCER CENTER ONLY)
ALT: 17 U/L (ref 0–44)
AST: 19 U/L (ref 15–41)
Albumin: 3.8 g/dL (ref 3.5–5.0)
Alkaline Phosphatase: 106 U/L (ref 38–126)
Anion gap: 9 (ref 5–15)
BUN: 14 mg/dL (ref 8–23)
CO2: 23 mmol/L (ref 22–32)
Calcium: 9.3 mg/dL (ref 8.9–10.3)
Chloride: 109 mmol/L (ref 98–111)
Creatinine: 1.1 mg/dL (ref 0.61–1.24)
GFR, Est AFR Am: >60 mL/min (ref >60)
GFR, Estimated: >60 mL/min (ref >60)
Glucose, Bld: 95 mg/dL (ref 70–99)
Potassium: 4.2 mmol/L (ref 3.5–5.1)
Sodium: 141 mmol/L (ref 135–145)
Total Bilirubin: 0.8 mg/dL (ref 0.3–1.2)
Total Protein: 7.1 g/dL (ref 6.5–8.1)

## 2019-07-25 LAB — CBC WITH DIFFERENTIAL (CANCER CENTER ONLY)
Abs Immature Granulocytes: 0.01 10*3/uL (ref 0.00–0.07)
Basophils Absolute: 0 10*3/uL (ref 0.0–0.1)
Basophils Relative: 1 %
Eosinophils Absolute: 0.3 10*3/uL (ref 0.0–0.5)
Eosinophils Relative: 5 %
HCT: 46.7 % (ref 39.0–52.0)
Hemoglobin: 16 g/dL (ref 13.0–17.0)
Immature Granulocytes: 0 %
Lymphocytes Relative: 25 %
Lymphs Abs: 1.5 10*3/uL (ref 0.7–4.0)
MCH: 30.7 pg (ref 26.0–34.0)
MCHC: 34.3 g/dL (ref 30.0–36.0)
MCV: 89.5 fL (ref 80.0–100.0)
Monocytes Absolute: 0.7 10*3/uL (ref 0.1–1.0)
Monocytes Relative: 12 %
Neutro Abs: 3.4 10*3/uL (ref 1.7–7.7)
Neutrophils Relative %: 57 %
Platelet Count: 171 10*3/uL (ref 150–400)
RBC: 5.22 MIL/uL (ref 4.22–5.81)
RDW: 13 % (ref 11.5–15.5)
WBC Count: 6 10*3/uL (ref 4.0–10.5)
nRBC: 0 % (ref 0.0–0.2)

## 2019-07-25 LAB — CEA (IN HOUSE-CHCC): CEA (CHCC-In House): 1.2 ng/mL (ref 0.00–5.00)

## 2019-07-25 NOTE — Telephone Encounter (Signed)
Gave avs and calendar ° °

## 2019-07-27 ENCOUNTER — Encounter: Payer: Self-pay | Admitting: Hematology

## 2019-09-26 DIAGNOSIS — Z125 Encounter for screening for malignant neoplasm of prostate: Secondary | ICD-10-CM | POA: Diagnosis not present

## 2019-09-26 DIAGNOSIS — I10 Essential (primary) hypertension: Secondary | ICD-10-CM | POA: Diagnosis not present

## 2019-09-26 DIAGNOSIS — E039 Hypothyroidism, unspecified: Secondary | ICD-10-CM | POA: Diagnosis not present

## 2019-10-01 DIAGNOSIS — Z923 Personal history of irradiation: Secondary | ICD-10-CM | POA: Diagnosis not present

## 2019-10-01 DIAGNOSIS — N529 Male erectile dysfunction, unspecified: Secondary | ICD-10-CM | POA: Diagnosis not present

## 2019-10-01 DIAGNOSIS — E032 Hypothyroidism due to medicaments and other exogenous substances: Secondary | ICD-10-CM | POA: Diagnosis not present

## 2019-10-01 DIAGNOSIS — Z8673 Personal history of transient ischemic attack (TIA), and cerebral infarction without residual deficits: Secondary | ICD-10-CM | POA: Diagnosis not present

## 2019-10-01 DIAGNOSIS — Z86718 Personal history of other venous thrombosis and embolism: Secondary | ICD-10-CM | POA: Diagnosis not present

## 2019-10-01 DIAGNOSIS — I10 Essential (primary) hypertension: Secondary | ICD-10-CM | POA: Diagnosis not present

## 2019-10-01 DIAGNOSIS — E785 Hyperlipidemia, unspecified: Secondary | ICD-10-CM | POA: Diagnosis not present

## 2019-10-01 DIAGNOSIS — I712 Thoracic aortic aneurysm, without rupture: Secondary | ICD-10-CM | POA: Diagnosis not present

## 2019-10-01 DIAGNOSIS — Z85038 Personal history of other malignant neoplasm of large intestine: Secondary | ICD-10-CM | POA: Diagnosis not present

## 2019-10-01 DIAGNOSIS — Z0001 Encounter for general adult medical examination with abnormal findings: Secondary | ICD-10-CM | POA: Diagnosis not present

## 2019-11-28 DIAGNOSIS — Z20828 Contact with and (suspected) exposure to other viral communicable diseases: Secondary | ICD-10-CM | POA: Diagnosis not present

## 2019-12-09 ENCOUNTER — Telehealth: Payer: Self-pay | Admitting: Hematology

## 2019-12-09 NOTE — Telephone Encounter (Signed)
Scheduled appt per 12/14 sch message- pt aware of new appt date and time

## 2019-12-10 ENCOUNTER — Inpatient Hospital Stay: Payer: Medicare HMO

## 2019-12-10 ENCOUNTER — Inpatient Hospital Stay: Payer: Medicare HMO | Admitting: Hematology

## 2019-12-10 DIAGNOSIS — Z20828 Contact with and (suspected) exposure to other viral communicable diseases: Secondary | ICD-10-CM | POA: Diagnosis not present

## 2019-12-10 DIAGNOSIS — E032 Hypothyroidism due to medicaments and other exogenous substances: Secondary | ICD-10-CM | POA: Diagnosis not present

## 2020-01-06 ENCOUNTER — Other Ambulatory Visit: Payer: Self-pay | Admitting: Cardiothoracic Surgery

## 2020-01-06 DIAGNOSIS — I712 Thoracic aortic aneurysm, without rupture, unspecified: Secondary | ICD-10-CM

## 2020-01-06 NOTE — Progress Notes (Signed)
[place]   [phone] Fax:(336) [phone]   Clinic Follow up Note   Patient Care Team: Deland Pretty, MD as PCP - General (Internal Medicine) Constance Haw, MD as PCP - Cardiology (Cardiology)  Date of Service:  01/09/2020  CHIEF COMPLAINT: Follow Up Sigmoid colon adenocarcinoma  SUMMARY OF ONCOLOGIC HISTORY: Oncology History Overview Note  Cancer Staging Colon cancer [facility]) Staging form: Colon and Rectum, AJCC 8th Edition - Pathologic stage from 03/08/2018: Stage IIIB (pT3, pN1b, cM0) - Signed by Alla Feeling, NP on 04/05/2018     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 Watt Climes -An infiltrative non-obstructing small mass was found in the mid sigmoid colon. Mass was partially circumferential (involving one third of the lumen circumference). The mass measured 2 cm in length, diameter was 2 mm. No bleeding was present.   -external and internal hemorrhoids -diverticulosis in the sigmoid colon, in the descending colon, in the transverse colon, and at the hepatic flexure -one medium polyp in the mid transverse colon  -ten small polyps in the rectum, in the sigmoid colon, in the descending colon, and in the transverse colon -likely malignant tumor in the mid sigmoid colon -exam was otherwise normal    02/26/2018 Initial Biopsy   From colonoscopy: 1. LG intestine- transverse colon, descending, polyp -Tubular adenomas (8). Sessile serrated adenoma/polyp 2. LG intestine - sigmoid colon bx -INVASIVE WELL DIFFERENTIATED ADENOCARCINOMA 3. LG intestine -sigmoid colon, rectum, polyp -Tubular adenoma with high grade dysplasia. Hypoplastic polyps (3).   02/26/2018 Initial Diagnosis   Colon cancer ([place])   02/28/2018 Imaging   CT AP IMPRESSION: -No evidence of metastatic disease or other acute findings. -Colonic diverticulosis, without radiographic evidence of diverticulitis. -Mild  hepatic steatosis. -Mildly enlarged prostate.    03/07/2018 Tumor Marker   CEA 1.3 (pre-op)   03/08/2018 Surgery   PROCEDURE:  Procedure(s): LAPAROSCOPIC ASSISTED SIGMOID COLECTOMY ERAS PATHWAY  MOBILIZATION OF SPLENIC FLEXURE RIGID SIGMOIDOSCOPY   03/08/2018 Cancer Staging   Staging form: Colon and Rectum, AJCC 8th Edition - Pathologic stage from 03/08/2018: Stage IIIB (pT3, pN1b, cM0) - Signed by Alla Feeling, NP on 04/05/2018   03/08/2018 Pathology Results   Diagnosis 1. Colon, segmental resection for tumor, sigmoid, stitch marks distal - INVASIVE ADENOCARCINOMA, MODERATELY DIFFERENTIATED, SPANNING 2.4 CM. - TUMOR FOCALLY INVADES THROUGH MUSCULARIS PROPRIA. - RESECTION MARGIN IS NEGATIVE. - METASTATIC CARCINOMA IN TWO OF TWENTY LYMPH NODES (2/20). - DIVERTICULOSIS. - HYPERPLASTIC POLYP (X2). - SEE ONCOLOGY TABLE. -MMR normal    04/09/2018 Imaging   CT Chest WO Contrast 04/09/18 IMPRESSION: 1. Stable exam. Scattered bilateral tiny pulmonary nodules without interval change. No new or progressive pulmonary nodule or mass. Attention on follow-up may be warranted. 2. Ascending thoracic aorta measures up to 4.9 cm maximum diameter, increased from 4.6 cm on prior study. Ascending thoracic aortic aneurysm. Recommend semi-annual imaging followup by CTA or MRA and referral to cardiothoracic surgery if not already obtained. This recommendation follows 2010 ACCF/AHA/AATS/ACR/ASA/SCA/SCAI/SIR/STS/SVM Guidelines for the Diagnosis and Management of Patients With Thoracic Aortic Disease. Circulation. 2010; 121: F026-V785   04/13/2018 - 07/05/2018 Chemotherapy   Adjuvant CAPOX every 3 weeks. Oxaliplatin 197m/m2, 2000 mg Xeloda BID on days 1-14, every 21 days starting 04/13/18. Dose reduced for first cycle. Last oxaliplatin treatment 06/15/18 at reduced dose due to neuropathy and fatigue, complete Xeloda on 06/29/18   06/04/2018 Genetic Testing   06/04/18 Testing did not reveal a pathogenic  mutation in any of the genes analyzed. A copy of the genetic test report will be scanned into Epic under the Media tab.  The genes analyzed were the 83 genes on [facility]'s Multi-Cancer panel (ALK, APC, ATM, AXIN2, BAP1, BARD1, BLM, BMPR1A, BRCA1, BRCA2, BRIP1, CASR, CDC73, CDH1, CDK4, CDKN1B, CDKN1C, CDKN2A, CEBPA, CHEK2, CTNNA1, DICER1, DIS3L2, EGFR, EPCAM, FH, FLCN, GATA2, GPC3, GREM1, HOXB13, HRAS, KIT, MAX, MEN1, MET, MITF, MLH1, MSH2, MSH3, MSH6, MUTYH, NBN, NF1, NF2, NTHL1, PALB2, PDGFRA, PHOX2B, PMS2, POLD1, POLE, POT1, PRKAR1A, PTCH1, PTEN, RAD50, RAD51C, RAD51D, RB1, RECQL4, RET, RUNX1, SDHA, SDHAF2, SDHB, SDHC, SDHD, SMAD4, SMARCA4, SMARCB1, SMARCE1, STK11, SUFU, TERC, TERT, TMEM127, TP53, TSC1, TSC2, VHL, WRN, WT1).    01/23/2019 Imaging   CT AP W Contrast 01/23/19  IMPRESSION: Status post partial left hemicolectomy. No evidence of recurrent or metastatic disease.  Additional stable ancillary findings as above. Dedicated CTA chest reported separately.   CT Angio Chest 01/23/19  IMPRESSION: 1. Stable aneurysmal disease of the ascending thoracic aorta measuring 4.7 cm in greatest diameter. No dissection. 2. Stable mild cardiac enlargement. 3. Stable small bilateral pulmonary nodules demonstrating stability since January, 2018. Aortic aneurysm NOS (ICD10-I71.9).      CURRENT THERAPY:  Surveillance  INTERVAL HISTORY:  Christian Weeks is here for a follow up of colon cancer. He was last seen by me 6 months ago. He presents to the clinic alone. He notes he is doing well. He notes he had COVID in 10/2019. He did not need hospitalization and quarantined at home. He tested negative in 11/2019. He has recovered well with no residual symptoms. He is not sure how he contracted it. He lost 6 pounds from 2020 and wanted to have this weight off.     REVIEW OF SYSTEMS:   Constitutional: Denies fevers, chills (+) Mild weight loss Eyes: Denies blurriness of vision Ears, nose, mouth,  throat, and face: Denies mucositis or sore throat Respiratory: Denies cough, dyspnea or wheezes Cardiovascular: Denies palpitation, chest discomfort or lower extremity swelling Gastrointestinal:  Denies nausea, heartburn or change in bowel habits Skin: Denies abnormal skin rashes Lymphatics: Denies new lymphadenopathy or easy bruising Neurological:Denies numbness, tingling or new weaknesses Behavioral/Psych: Mood is stable, no new changes  All other systems were reviewed with the patient and are negative.  MEDICAL HISTORY:  Past Medical History:  Diagnosis Date  . Arthritis   . Blood clot in vein 01/2016  . Colon cancer ([place])   . Colon polyps   . Degenerative joint disease   . Genetic testing 06/11/18   Multi-Cancer panel (83 genes) @ [facility] - No pathogenic mutations detected  . History of kidney stones age 33's  . Hypertension   . Hypothyroidism   . Mass of colon    sigmoid  . Stroke ([place]) 01/2016   speech difficulty for a few hours after knee replacement left  . Thoracic ascending aortic aneurysm ([facility])    4.6-4.7 cm followed by Dr Servando Snare- LOV- 08/2017-epic   . Wears dentures   . Wears glasses     SURGICAL HISTORY: Past Surgical History:  Procedure Laterality Date  . colonscopy  02/26/2018  . KNEE ARTHROPLASTY Left 01/15/2016   Procedure: COMPUTER ASSISTED TOTAL KNEE ARTHROPLASTY;  Surgeon: [name], MD;  Location: [facility];  Service: Orthopedics;  Laterality: Left;  . LAPAROSCOPIC SIGMOID COLECTOMY N/A 03/08/2018   Procedure: LAPAROSCOPIC ASSISTED SIGMOID COLECTOMY ERAS PATHWAY;  Surgeon: Jovita Kussmaul, MD;  Location: [facility];  Service: General;  Laterality: N/A;  .  MULTIPLE TOOTH EXTRACTIONS    . TOTAL KNEE ARTHROPLASTY Right 01/13/2017   Procedure: RIGHT TOTAL KNEE ARTHROPLASTY;  Surgeon: [name], MD;  Location: [facility];  Service: Orthopedics;  Laterality: Right;    I have reviewed the social history and family history with the patient and they are unchanged from  previous note.  ALLERGIES:  is allergic to quinolones and poison oak extract.  MEDICATIONS:  Current Outpatient Medications  Medication Sig Dispense Refill  . acetaminophen (TYLENOL) 500 MG tablet Take 1,000 mg by mouth every 6 (six) hours as needed for moderate pain or headache.     . Cholecalciferol (VITAMIN D3) 3000 units TABS Take 3,000 Units by mouth daily.    Marland Kitchen SYNTHROID 125 MCG tablet Take 125 mcg by mouth daily.     No current facility-administered medications for this visit.    PHYSICAL EXAMINATION: ECOG PERFORMANCE STATUS: 0 - Asymptomatic  Vitals:   01/09/20 0835  BP: 124/80  Pulse: (!) 54  Resp: 18  Temp: 98.6 F (37 C)  SpO2: 98%   Filed Weights   01/09/20 0835  Weight: 234 lb 12.8 oz (106.5 kg)    GENERAL:alert, no distress and comfortable SKIN: skin color, texture, turgor are normal, no rashes or significant lesions EYES: normal, Conjunctiva are pink and non-injected, sclera clear  NECK: supple, thyroid normal size, non-tender, without nodularity LYMPH:  no palpable lymphadenopathy in the cervical, axillary  LUNGS: clear to auscultation and percussion with normal breathing effort HEART: regular rate & rhythm and no murmurs and no lower extremity edema ABDOMEN:abdomen soft, non-tender and normal bowel sounds Musculoskeletal:no cyanosis of digits and no clubbing  NEURO: alert & oriented x 3 with fluent speech, no focal motor/sensory deficits  LABORATORY DATA:  I have reviewed the data as listed CBC Latest Ref Rng & Units 01/09/2020 07/25/2019 01/23/2019  WBC 4.0 - 10.5 K/uL 6.9 6.0 5.8  Hemoglobin 13.0 - 17.0 g/dL 15.0 16.0 15.9  Hematocrit 39.0 - 52.0 % 44.6 46.7 46.8  Platelets 150 - 400 K/uL 200 171 156     CMP Latest Ref Rng & Units 01/09/2020 07/25/2019 01/23/2019  Glucose 70 - 99 mg/dL 96 95 112(H)  BUN 8 - 23 mg/dL _0 Creatinine 0.61 - 1.24 mg/dL 1.06 1.10 1.11  Sodium 135 - 145 mmol/L 138 141 139  Potassium 3.5 - 5.1 mmol/L 4.5 4.2 4.1    Chloride 98 - 111 mmol/L 108 109 107  CO2 22 - 32 mmol/L _1 Calcium 8.9 - 10.3 mg/dL 8.4(L) 9.3 9.1  Total Protein 6.5 - 8.1 g/dL 7.1 7.1 7.5  Total Bilirubin 0.3 - 1.2 mg/dL 0.8 0.8 0.7  Alkaline Phos 38 - 126 U/L 94 106 114  AST 15 - 41 U/L _2 ALT 0 - 44 U/L _3 RADIOGRAPHIC STUDIES: I have personally reviewed the radiological images as listed and agreed with the findings in the report. No results found.   ASSESSMENT & PLAN:  Christian Weeks is a 69 y.o. male with   1. Sigmoid colon adenocarcinoma, well differentiated, pT3N1b,M0, stage IIIB, MMR-normal -He was diagnosed in 02/2018. He is s/p sigmoidhemicolectomy and adjuvantchemo withCAPOX.  -Hehas recovered very well from chemotherapywith now mild residual intermittent tingling finger tips -His 06/2019 colonoscopy showed hemorrhoids, sigmoid diverticulosis and adenoma and hyperplastic polyps. Repeat in 2022.  -He is clinically doing very well. Labs reviewed, CBC and CMP WNL. CEA still pending. Physical exam  unremarkable. There is no clinical concern for recurrence.  -He is almost 2 years since his diagnosis. I reviewed his risk of recurrence will significantly reduce after 2-3 years NED. Plan for CT scan in 01/2020. I will call him with results. Plan for last scan in 2022.  -F/u in 6 months   2. Genetic Testing was negative   3. HTN, thoracic aneurysm -Stable 4.6 ascending thoracic aortic aneurysm followed by Dr. Servando Snare  -BP well controlled on losartan per PCP  -He is scheduled for next CT scan on 2/25   4. 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 OnEliquisfrom 01/2016 - 08/2017. No recurrent edema. Followed by PCP and neuro.   -I previously encouraged him to remain active as chemotherapy can increase his risk for blood clots.  5. Peripheral neuropathy, grade 1 -Secondary topriorchemotherapy and residual after completions. Continue  monitoring -I discussed neuropathy can take time to recover and may not return to baseline. I suggest he use OTC B complex. -Now minimal and intermittent tingling in his fingertips.   6. COVID19 positive  -He tested positive for COVID in 10/2019. He did not need hospitalization and quarantined at home. He tested negative in 11/2019. He has recovered well with no residual symptoms. He is not sure how he contracted it.  -He is interested in vaccination when it becomes available to him, I encouraged him to proceed with it.    PLAN: -He is clinically doing well.  -CT AP W Contrast on 2/25, same day with her CTA chest. I will call with results.  -Lab and f/u in 6 months    No problem-specific Assessment & Plan notes found for this encounter.   Orders Placed This Encounter  Procedures  . CT Abdomen Pelvis W Contrast    Standing Status:   Future    Standing Expiration Date:   01/08/2021    Scheduling Instructions:     Please schedule it at same time with his CT chest on 02/20/20    Order Specific Question:   If indicated for the ordered procedure, I authorize the administration of contrast media per Radiology protocol    Answer:   Yes    Order Specific Question:   Preferred imaging location?    Answer:   [id number]    Order Specific Question:   Is Oral Contrast requested for this exam?    Answer:   Yes, Per Radiology protocol    Order Specific Question:   Radiology Contrast Protocol - do NOT remove file path    Answer:   [email or web address]   All questions were answered. The patient knows to call the clinic with any problems, questions or concerns. No barriers to learning was detected. The total time spent in the appointment was 30 minutes.     Truitt Merle, MD 01/09/2020   I, Joslyn Devon, am acting as scribe for Truitt Merle, MD.   I have reviewed the above documentation for accuracy and completeness, and I agree with the above.

## 2020-01-09 ENCOUNTER — Other Ambulatory Visit: Payer: Self-pay

## 2020-01-09 ENCOUNTER — Inpatient Hospital Stay: Payer: Medicare HMO | Admitting: Hematology

## 2020-01-09 ENCOUNTER — Telehealth: Payer: Self-pay | Admitting: Hematology

## 2020-01-09 ENCOUNTER — Inpatient Hospital Stay: Payer: Medicare HMO | Attending: Hematology

## 2020-01-09 ENCOUNTER — Encounter: Payer: Self-pay | Admitting: Hematology

## 2020-01-09 VITALS — BP 124/80 | HR 54 | Temp 98.6°F | Resp 18 | Ht 72.0 in | Wt 234.8 lb

## 2020-01-09 DIAGNOSIS — Z8673 Personal history of transient ischemic attack (TIA), and cerebral infarction without residual deficits: Secondary | ICD-10-CM | POA: Insufficient documentation

## 2020-01-09 DIAGNOSIS — Z85038 Personal history of other malignant neoplasm of large intestine: Secondary | ICD-10-CM | POA: Diagnosis present

## 2020-01-09 DIAGNOSIS — C772 Secondary and unspecified malignant neoplasm of intra-abdominal lymph nodes: Secondary | ICD-10-CM

## 2020-01-09 DIAGNOSIS — I10 Essential (primary) hypertension: Secondary | ICD-10-CM | POA: Diagnosis not present

## 2020-01-09 DIAGNOSIS — C187 Malignant neoplasm of sigmoid colon: Secondary | ICD-10-CM

## 2020-01-09 DIAGNOSIS — E039 Hypothyroidism, unspecified: Secondary | ICD-10-CM | POA: Insufficient documentation

## 2020-01-09 DIAGNOSIS — Z8616 Personal history of COVID-19: Secondary | ICD-10-CM | POA: Diagnosis not present

## 2020-01-09 DIAGNOSIS — Z79899 Other long term (current) drug therapy: Secondary | ICD-10-CM | POA: Insufficient documentation

## 2020-01-09 DIAGNOSIS — Z9221 Personal history of antineoplastic chemotherapy: Secondary | ICD-10-CM | POA: Insufficient documentation

## 2020-01-09 DIAGNOSIS — I712 Thoracic aortic aneurysm, without rupture: Secondary | ICD-10-CM | POA: Insufficient documentation

## 2020-01-09 DIAGNOSIS — G629 Polyneuropathy, unspecified: Secondary | ICD-10-CM | POA: Diagnosis not present

## 2020-01-09 DIAGNOSIS — Z86718 Personal history of other venous thrombosis and embolism: Secondary | ICD-10-CM | POA: Diagnosis not present

## 2020-01-09 LAB — CBC WITH DIFFERENTIAL (CANCER CENTER ONLY)
Abs Immature Granulocytes: 0.01 10*3/uL (ref 0.00–0.07)
Basophils Absolute: 0.1 10*3/uL (ref 0.0–0.1)
Basophils Relative: 1 %
Eosinophils Absolute: 0.5 10*3/uL (ref 0.0–0.5)
Eosinophils Relative: 8 %
HCT: 44.6 % (ref 39.0–52.0)
Hemoglobin: 15 g/dL (ref 13.0–17.0)
Immature Granulocytes: 0 %
Lymphocytes Relative: 28 %
Lymphs Abs: 1.9 10*3/uL (ref 0.7–4.0)
MCH: 29.9 pg (ref 26.0–34.0)
MCHC: 33.6 g/dL (ref 30.0–36.0)
MCV: 89 fL (ref 80.0–100.0)
Monocytes Absolute: 0.9 10*3/uL (ref 0.1–1.0)
Monocytes Relative: 13 %
Neutro Abs: 3.6 10*3/uL (ref 1.7–7.7)
Neutrophils Relative %: 50 %
Platelet Count: 200 10*3/uL (ref 150–400)
RBC: 5.01 MIL/uL (ref 4.22–5.81)
RDW: 13.9 % (ref 11.5–15.5)
WBC Count: 6.9 10*3/uL (ref 4.0–10.5)
nRBC: 0 % (ref 0.0–0.2)

## 2020-01-09 LAB — CMP (CANCER CENTER ONLY)
ALT: 10 U/L (ref 0–44)
AST: 15 U/L (ref 15–41)
Albumin: 3.8 g/dL (ref 3.5–5.0)
Alkaline Phosphatase: 94 U/L (ref 38–126)
Anion gap: 7 (ref 5–15)
BUN: 13 mg/dL (ref 8–23)
CO2: 23 mmol/L (ref 22–32)
Calcium: 8.4 mg/dL — ABNORMAL LOW (ref 8.9–10.3)
Chloride: 108 mmol/L (ref 98–111)
Creatinine: 1.06 mg/dL (ref 0.61–1.24)
GFR, Est AFR Am: >60 mL/min (ref >60)
GFR, Estimated: >60 mL/min (ref >60)
Glucose, Bld: 96 mg/dL (ref 70–99)
Potassium: 4.5 mmol/L (ref 3.5–5.1)
Sodium: 138 mmol/L (ref 135–145)
Total Bilirubin: 0.8 mg/dL (ref 0.3–1.2)
Total Protein: 7.1 g/dL (ref 6.5–8.1)

## 2020-01-09 LAB — CEA (IN HOUSE-CHCC): CEA (CHCC-In House): 2.12 ng/mL (ref 0.00–5.00)

## 2020-01-09 NOTE — Telephone Encounter (Signed)
Scheduled appt per 1/14 los.  Sent a message to HIM pool to get a calendar mailed out. 

## 2020-01-13 ENCOUNTER — Telehealth: Payer: Self-pay

## 2020-01-13 NOTE — Telephone Encounter (Signed)
-----   Message from Truitt Merle, MD sent at 01/10/2020 12:20 PM EST ----- Please let pt know his tumor marker and CMP were WNL, no concerns, thanks   Truitt Merle  01/10/2020

## 2020-01-13 NOTE — Telephone Encounter (Signed)
Spoke with patient regarding lab results.  Informed him tumor marker and CMP were WNL, no concerns.  He verbalized an understanding.

## 2020-02-20 ENCOUNTER — Ambulatory Visit
Admission: RE | Admit: 2020-02-20 | Discharge: 2020-02-20 | Disposition: A | Discharge status: Home / Self Care | Payer: Medicare HMO | Source: Ambulatory Visit | Attending: Cardiothoracic Surgery | Admitting: Cardiothoracic Surgery

## 2020-02-20 ENCOUNTER — Telehealth: Payer: Self-pay

## 2020-02-20 ENCOUNTER — Ambulatory Visit: Payer: Medicare HMO | Admitting: Cardiothoracic Surgery

## 2020-02-20 ENCOUNTER — Ambulatory Visit
Admission: RE | Admit: 2020-02-20 | Discharge: 2020-02-20 | Disposition: A | Discharge status: Home / Self Care | Payer: Medicare HMO | Source: Ambulatory Visit | Attending: Hematology | Admitting: Hematology

## 2020-02-20 ENCOUNTER — Other Ambulatory Visit: Payer: Self-pay

## 2020-02-20 VITALS — BP 104/75 | HR 63 | Temp 97.7°F | Resp 16 | Ht 72.0 in | Wt 231.0 lb

## 2020-02-20 DIAGNOSIS — C187 Malignant neoplasm of sigmoid colon: Secondary | ICD-10-CM

## 2020-02-20 DIAGNOSIS — I712 Thoracic aortic aneurysm, without rupture, unspecified: Secondary | ICD-10-CM

## 2020-02-20 DIAGNOSIS — I7121 Aneurysm of the ascending aorta, without rupture: Secondary | ICD-10-CM

## 2020-02-20 MED ORDER — IOPAMIDOL (ISOVUE-370) INJECTION 76%
75.0000 mL | Freq: Once | INTRAVENOUS | Status: AC | PRN
  Administered 2020-02-20: 75 mL via INTRAVENOUS

## 2020-02-20 NOTE — Progress Notes (Signed)
[address] Weeks [address]       [facility],[place] [address]             [phone]                    [place] Jaskot Aguilita Medical Record [id number] Date of Birth: 30-Aug-1951  Referring: [name], MD Primary Care: Deland Pretty, MD  Chief Complaint:    Chief Complaint  Patient presents with  . Thoracic Aortic Aneurysm    1 year f/u with C/A/P CT     History of Present Illness:    Christian Weeks 69 y.o. male is seen in follow-up for dilated ascending aorta.  He returns today with a follow-up CT scan.  He has history  stage IIIb colon cancer.  He had a surgical resection with 2 out of 20 nodes positive.  He was treated with adjuvant CAPOX every 3 weeks. 2000 mg Xeloda BID on days 1-14, every 21 days .   For evaluation of his colon cancer noncontrast CT scan of the chest was done in April suggesting that his aorta was 4.9 cm.  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 This resolved fairly quickly. Echocardiogram did not confirm a patent foramen on bubble test, transcranial Doppler did. At the time the patient was noted to have a left lower extremity DVT. He notes he was anticoagulated for one month.   He returned in early 2018 to have the right knee replaced, on a preop chest x-ray it was suggested that he had a descending thoracic aneurysm. A CT of the chest was performed, there was no descending thoracic aneurysm there was mild dilatation of the ascending aorta, 4.7 cm. echocardiogram showed a trileaflet aortic  valve without stenosis or insufficiency.  The patient has no family history of aortic aneurysm, Marfan's or other connective tissue disorder, there is no family history of young family members dying suddenly without known cause. Patient notes his mother died of congestive heart failure at age 19 father died at age 57 possibly from a myocardial infarction. One sister died at age 3 with severe COPD, he has to other brothers 65 and 51 and a sister age 9.  His older brother died several weeks ago of dementia.  Since the patient was last seen he notes that in the fall 2020 he developed Covid infection did not require hospitalization.  He noted he lost 20 pounds but now feels like he has recovered he has had no longstanding respiratory issue    Current Activity/ Functional Status:  Patient is independent with mobility/ambulation, transfers, ADL's, IADL's.   Zubrod Score: At the time of surgery this patient's most appropriate activity status/level should be described as: []     0    Normal activity, no symptoms [x]     1    Restricted in physical strenuous activity but ambulatory, able to do out light work []     2    Ambulatory and capable of self care, unable  to do work activities, up and about               >50 % of waking hours                              []     3    Only limited self care, in bed greater than 50% of waking hours []     4    Completely disabled, no self care, confined to bed or chair []     5    Moribund   Past Medical History:  Diagnosis Date  . Arthritis   . Blood clot in vein 01/2016  . Colon cancer ([place])   . Colon polyps   . Degenerative joint disease   . Genetic testing 06/11/18   Multi-Cancer panel (83 genes) @ [facility] - No pathogenic mutations detected  . History of kidney stones age 53's  . Hypertension   . Hypothyroidism   . Mass of colon    sigmoid  . Stroke ([place]) 01/2016   speech difficulty for a few hours after knee replacement left  . Thoracic ascending  aortic aneurysm ([facility])    4.6-4.7 cm followed by Dr Servando Snare- LOV- 08/2017-epic   . Wears dentures   . Wears glasses     Past Surgical History:  Procedure Laterality Date  . colonscopy  02/26/2018  . KNEE ARTHROPLASTY Left 01/15/2016   Procedure: COMPUTER ASSISTED TOTAL KNEE ARTHROPLASTY;  Surgeon: [name], MD;  Location: [facility];  Service: Orthopedics;  Laterality: Left;  . LAPAROSCOPIC SIGMOID COLECTOMY N/A 03/08/2018   Procedure: LAPAROSCOPIC ASSISTED SIGMOID COLECTOMY ERAS PATHWAY;  Surgeon: Jovita Kussmaul, MD;  Location: [facility];  Service: General;  Laterality: N/A;  . MULTIPLE TOOTH EXTRACTIONS    . TOTAL KNEE ARTHROPLASTY Right 01/13/2017   Procedure: RIGHT TOTAL KNEE ARTHROPLASTY;  Surgeon: [name], MD;  Location: [facility];  Service: Orthopedics;  Laterality: Right;    Family History  Problem Relation Age of Onset  . Congestive Heart Failure Mother   . Diabetes Mother   . Thyroid disease Sister     Social History   Social History  . Marital status: Widowed- Patient's wife died in 04/07/16     Spouse name: N/A  . Number of children: N/A  . Years of education: N/A   Occupational History  . Not on file.   Social History Main Topics  . Smoking status: Former Smoker    Quit date: 11/16/1996  . Smokeless tobacco: Never Used  . Alcohol use No  . Drug use: No  . Sexual activity: Not on file      Social History   Tobacco Use  Smoking Status Former Smoker  . Packs/day: 1.50  . Years: 30.00  . Pack years: 45.00  . Types: Cigarettes  . Quit date: 11/16/1996  . Years since quitting: 23.2  Smokeless Tobacco Never Used  Tobacco Comment   quit 25 yrs ago    Social History   Substance and Sexual Activity  Alcohol Use No     Allergies  Allergen Reactions  . Quinolones     Patient was warned about not using Cipro and similar antibiotics. Recent studies have raised concern that fluoroquinolone antibiotics could be associated with an increased risk of aortic  aneurysm Fluoroquinolones have non-antimicrobial properties that might jeopardise the integrity of the extracellular matrix of the vascular wall In a  propensity score matched cohort  study in [place], there was a 66% increased rate of aortic aneurysm or dissection associated with oral fluoroquinolone use, compared wit  . Poison Oak Extract Rash    Current Outpatient Medications  Medication Sig Dispense Refill  . acetaminophen (TYLENOL) 500 MG tablet Take 1,000 mg by mouth every 6 (six) hours as needed for moderate pain or headache.     . Cholecalciferol (VITAMIN D3) 3000 units TABS Take 3,000 Units by mouth daily.    Marland Kitchen SYNTHROID 125 MCG tablet Take 125 mcg by mouth daily.     No current facility-administered medications for this visit.      Review of Systems:  Review of Systems  Constitutional: Negative.   HENT: Negative.   Eyes: Negative.   Respiratory: Negative.   Cardiovascular: Negative.   Gastrointestinal: Negative.   Genitourinary: Negative.   Musculoskeletal: Negative.   Skin: Negative.   Neurological: Negative.   Endo/Heme/Allergies: Negative.   Psychiatric/Behavioral: Negative.        Physical Exam: BP 104/75   Pulse 63   Temp 97.7 F (36.5 C) (Skin)   Resp 16   Ht 6' (1.829 m)   Wt 231 lb (104.8 kg)   SpO2 96% Comment: RA  BMI 31.33 kg/m   PHYSICAL EXAMINATION: General appearance: alert, cooperative and no distress Head: Normocephalic, without obvious abnormality, atraumatic Neck: no adenopathy, no carotid bruit, no JVD, supple, symmetrical, trachea midline and thyroid not enlarged, symmetric, no tenderness/mass/nodules Lymph nodes: Cervical, supraclavicular, and axillary nodes normal. Resp: clear to auscultation bilaterally Cardio: regular rate and rhythm, S1, S2 normal, no murmur, click, rub or gallop GI: soft, non-tender; bowel sounds normal; no masses,  no organomegaly Extremities: extremities normal, atraumatic, no cyanosis or edema and Homans sign  is negative, no sign of DVT Neurologic: Grossly normal  Diagnostic Studies & Laboratory data:     Recent Radiology Findings:  CT ANGIO CHEST AORTA W/CM & OR WO/CM  Result Date: 02/20/2020 CLINICAL DATA:  Thoracic aortic aneurysm follow-up. EXAM: CT ANGIOGRAPHY CHEST WITH CONTRAST TECHNIQUE: Multidetector CT imaging of the chest was performed using the standard protocol during bolus administration of intravenous contrast. Multiplanar CT image reconstructions and MIPs were obtained to evaluate the vascular anatomy. CONTRAST:  31mL ISOVUE-370 IOPAMIDOL (ISOVUE-370) INJECTION 76% COMPARISON:  05/15/2018, 01/23/2019 FINDINGS: Cardiovascular: Ascending thoracic aorta at 4.7 cm. Tortuous descending thoracic aorta, maximal caliber 2.8 cm. No signs of dissection. Heart size remains mildly enlarged without pericardial effusion. Central pulmonary arteries are of normal caliber. Mediastinum/Nodes: No signs of adenopathy in the chest. Scattered lymph nodes throughout the chest as before. Lungs/Pleura: Signs of subpleural reticulation in the peripheral left chest with stable small lingular nodule (image 112, series 7) 7 mm. Interval development of small area of nodularity in the subpleural left chest upper lobe, (image 54, series 7) 7 mm. Increasing subpleural reticulation bilaterally since the previous study. There is also some atelectatic change on today's exam Small nodule in the left chest (image 81, series 7) this measures 8 mm previously approximately 6 mm no signs of pleural effusion. Airways are patent. Upper Abdomen: Lobular hepatic contours. Spleen is normal size. Moderate-sized duodenal diverticulum. Normal appearance of the adrenal glands. Mild cortical scarring in the visualized portions of the kidneys. No signs of acute process in the upper abdomen. Musculoskeletal: No signs of chest wall mass. No acute bone process. Spinal degenerative change. Degenerative changes in the glenohumeral joints bilaterally.  Review of the MIP images confirms the above findings. IMPRESSION: 1. Ascending thoracic aorta at 4.7  cm, stable. 2. Stable mild cardiac enlargement. 3. Increasing subpleural reticulation in both lungs since the previous study may represent developing fibrosis. But is nonspecific. Correlate with any recent infection and consider three-month follow-up with high-resolution chest CT. This could also be utilized to evaluate the small peripheral nodule seen in the left chest and the slightly enlarged nodule seen in the left upper lobe as described. 4. Nodular hepatic contours similar to prior exam. Aortic Atherosclerosis (ICD10-I70.0). Electronically Signed   By: Zetta Bills M.D.   On: 02/20/2020 17:31   Ct Abdomen Pelvis W Contrast  Result Date: 01/23/2019 CLINICAL DATA:  Colon cancer, status post partial colon resection and chemotherapy EXAM: CT ABDOMEN AND PELVIS WITH CONTRAST TECHNIQUE: Multidetector CT imaging of the abdomen and pelvis was performed using the standard protocol following bolus administration of intravenous contrast. CONTRAST:  185mL ISOVUE-370 IOPAMIDOL (ISOVUE-370) INJECTION 76% COMPARISON:  02/28/2018 FINDINGS: Lower chest: Dedicated CTA chest described separately. Hepatobiliary: Liver is within normal limits. No suspicious/enhancing hepatic lesions. Gallbladder is unremarkable. No intrahepatic or extrahepatic ductal dilatation. Pancreas: Within normal limits. Spleen: In normal limits. Adrenals/Urinary Tract: Adrenal glands are within normal limits. 8 mm cyst in the anterior interpolar right kidney (series 7/image 36). Left kidney is within normal limits. No hydronephrosis. Bladder is underdistended but unremarkable. Stomach/Bowel: Stomach is within normal limits. No evidence of bowel obstruction. Normal appendix (series 7/image 47). Scattered duodenal diverticuli. Status post partial left hemicolectomy with suture line in the lower pelvis (series 7/image 72). Mild left colonic  diverticulosis, without evidence of diverticulitis. Vascular/Lymphatic: No evidence of abdominal aortic aneurysm. Atherosclerotic calcifications of the abdominal aorta and branch vessels. No suspicious abdominopelvic lymphadenopathy. Reproductive: Prostatomegaly, with enlargement the central gland, indenting the base the bladder. Other: No abdominopelvic ascites. Musculoskeletal: Mild degenerative changes the lumbar spine. IMPRESSION: Status post partial left hemicolectomy. No evidence of recurrent or metastatic disease. Additional stable ancillary findings as above. Dedicated CTA chest reported separately. Electronically Signed   By: Julian Hy M.D.   On: 01/23/2019 12:28   Ct Angio Chest Aorta W/cm &/or Wo/cm  Result Date: 01/23/2019 CLINICAL DATA:  History aneurysmal disease of the ascending thoracic aorta previously measuring approximately 4.6-4.7 cm by prior CTA. History of sigmoid colonic adenocarcinoma and status post resection and chemotherapy. EXAM: CT ANGIOGRAPHY CHEST WITH CONTRAST TECHNIQUE: Multidetector CT imaging of the chest was performed using the standard protocol during bolus administration of intravenous contrast. Multiplanar CT image reconstructions and MIPs were obtained to evaluate the vascular anatomy. CONTRAST:  166mL ISOVUE-370 IOPAMIDOL (ISOVUE-370) INJECTION 76% COMPARISON:  05/15/2018 and additional prior studies. FINDINGS: Cardiovascular: The aortic root remains normal in caliber at the level of the sinuses of Valsalva and measures approximately 3.6-3.7 cm. The ascending thoracic aorta shows stable aneurysmal disease measuring approximately 4.7 cm in greatest diameter. The proximal arch measures 3.9 cm and the distal arch 3.3 cm. The descending thoracic aorta measures 3 cm. No evidence of dissection. Proximal great vessels demonstrate stable and normal patency and normal branching anatomy. Stable mild cardiac enlargement. No pericardial fluid identified. No significant  calcified coronary artery plaque identified. Central pulmonary arteries are normal in caliber. Mediastinum/Nodes: No enlarged mediastinal, hilar, or axillary lymph nodes. Thyroid gland, trachea, and esophagus demonstrate no significant findings. Lungs/Pleura: Stable 3 mm subpleural inferior right upper lobe nodule, 6 mm nodularity along the minor fissure, 6 mm subpleural inferior lingular nodule and 5 mm medial left upper lobe nodule. No new pulmonary nodules. There is no evidence of pulmonary edema, consolidation, pneumothorax or  pleural fluid. Upper Abdomen: No acute abnormality. Musculoskeletal: No chest wall abnormality. No acute or significant osseous findings. Review of the MIP images confirms the above findings. IMPRESSION: 1. Stable aneurysmal disease of the ascending thoracic aorta measuring 4.7 cm in greatest diameter. No dissection. 2. Stable mild cardiac enlargement. 3. Stable small bilateral pulmonary nodules demonstrating stability since January, 2018. Aortic aneurysm NOS (ICD10-I71.9). Electronically Signed   By: Aletta Edouard M.D.   On: 01/23/2019 11:40   I have independently reviewed the above radiology studies  and reviewed the findings with the patient.    Ct Angio Chest Aorta W &/or Wo Contrast  Result Date: 05/15/2018 CLINICAL DATA:  Thoracic aortic aneurysm without rupture. EXAM: CT ANGIOGRAPHY CHEST WITH CONTRAST TECHNIQUE: Multidetector CT imaging of the chest was performed using the standard protocol during bolus administration of intravenous contrast. Multiplanar CT image reconstructions and MIPs were obtained to evaluate the vascular anatomy. CONTRAST:  75 mL of Isovue 370 intravenously. COMPARISON:  CT scan of April 09, 2018 and September 21, 2017. FINDINGS: Cardiovascular: 4.7 cm ascending thoracic aortic aneurysm is noted. No dissection is noted. Great vessels are widely patent. Transverse thoracic aorta measures 3.2 cm. Proximal descending thoracic aorta measures 2.7 cm.  Normal pericardial effusion is noted. Mediastinum/Nodes: No enlarged mediastinal, hilar, or axillary lymph nodes. Thyroid gland, trachea, and esophagus demonstrate no significant findings. Lungs/Pleura: Lungs are clear. No pleural effusion or pneumothorax. Upper Abdomen: No acute abnormality. Musculoskeletal: No chest wall abnormality. No acute or significant osseous findings. Review of the MIP images confirms the above findings. IMPRESSION: 4.7 cm ascending thoracic aortic aneurysm. Ascending thoracic aortic aneurysm. Recommend semi-annual imaging followup by CTA or MRA and referral to cardiothoracic surgery if not already obtained. This recommendation follows 2010 ACCF/AHA/AATS/ACR/ASA/SCA/SCAI/SIR/STS/SVM Guidelines for the Diagnosis and Management of Patients With Thoracic Aortic Disease. Circulation. 2010; [id number]. Electronically Signed   By: Marijo Conception, M.D.   On: 05/15/2018 14:40    Ct Angio Chest Aorta W/cm &/or Wo/cm  Result Date: 09/21/2017 CLINICAL DATA:  Follow-up thoracic aortic aneurysm EXAM: CT ANGIOGRAPHY CHEST WITH CONTRAST TECHNIQUE: Multidetector CT imaging of the chest was performed using the standard protocol during bolus administration of intravenous contrast. Multiplanar CT image reconstructions and MIPs were obtained to evaluate the vascular anatomy. CONTRAST:  80 cc Isovue 370 IV COMPARISON:  01/14/2017 FINDINGS: Cardiovascular: Stable ascending thoracic aorta, measuring up to 4.6 cm compared to 4.7 cm previously. No evidence of dissection. Heart is borderline in size. Scattered aortic and coronary artery calcifications. Mediastinum/Nodes: No mediastinal, hilar, or axillary adenopathy. Lungs/Pleura: Linear areas of scarring in the lingula. Lingular nodule measures 5 mm on image 78, stable. Nodule along the right minor fissure measures 5 mm on image 67, stable slight nodularity along the left major fissure on image 59 is stable. Left upper lobe nodule on image 54 measures 5  mm, stable. No new or enlarging pulmonary nodules. No pleural effusions. Upper Abdomen: Imaging into the upper abdomen shows no acute findings. Musculoskeletal: Chest wall soft tissues are unremarkable. No acute bony abnormality. Review of the MIP images confirms the above findings. 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 Recommend semi-annual imaging followup by CTA or MRA and referral to cardiothoracic surgery if not already obtained. This recommendation follows 2010 ACCF/AHA/AATS/ACR/ASA/SCA/SCAI/SIR/STS/SVM Guidelines for the Diagnosis and Management of Patients With Thoracic Aortic Disease. Circulation. 2010; [id number] Scattered small pulmonary nodules are stable. No follow-up needed if patient is low-risk (and has no known or suspected primary neoplasm).  Non-contrast chest CT can be considered in 12 months if patient is high-risk. This recommendation follows the consensus statement: Guidelines for Management of Incidental Pulmonary Nodules Detected on CT Images: From the Fleischner Society 2017; Radiology 2017; 284:228-243. Electronically Signed   By: Rolm Baptise M.D.   On: 09/21/2017 15:20        CLINICAL DATA:  Unexplained retrocardiac density on a recent lateral chest radiograph view. Inpatient.  EXAM: CT CHEST WITH CONTRAST  TECHNIQUE: Multidetector CT imaging of the chest was performed during intravenous contrast administration.  CONTRAST:  70mL ISOVUE-300 IOPAMIDOL (ISOVUE-300) INJECTION 61%  COMPARISON:  01/04/2017 chest radiograph.  FINDINGS: Cardiovascular: Top-normal heart size. No significant pericardial fluid/thickening. Left anterior descending coronary atherosclerosis. Atherosclerotic and tortuous thoracic aorta with aneurysmal 4.7 cm maximum diameter ascending thoracic aorta. Normal caliber pulmonary arteries. No central pulmonary emboli.  Mediastinum/Nodes: Heterogeneous and atrophic appearing thyroid gland without discrete thyroid  nodules. Unremarkable esophagus. No pathologically enlarged axillary, mediastinal or hilar lymph nodes.  Lungs/Pleura: No pneumothorax. No pleural effusion. There are four scattered solid pulmonary nodules, largest 5 mm in the right middle lobe associated with the minor fissure (series 3/image 79) and 5 mm in the lingula (series 3/ image 100). No acute consolidative airspace disease, lung masses or additional significant pulmonary nodules. Mild centrilobular and paraseptal emphysema.  Upper abdomen: Unremarkable.  Musculoskeletal: No aggressive appearing focal osseous lesions. Moderate thoracic spondylosis.  IMPRESSION: 1. Retrocardiac density on the lateral chest radiograph view correlates with focal tortuosity of the descending thoracic aorta. No lung masses. No consolidative airspace disease. 2. Four scattered solid pulmonary nodules, largest 5 mm. No follow-up needed if patient is low-risk (and has no known or suspected primary neoplasm). Non-contrast chest CT can be considered in 12 months if patient is high-risk. This recommendation follows the consensus statement: Guidelines for Management of Incidental Pulmonary Nodules Detected on CT Images: From the Fleischner Society 2017; Radiology 2017; 284:228-243. 3. Aortic atherosclerosis. Ascending thoracic aortic aneurysm, maximum diameter 4.7 cm. Ascending thoracic aortic aneurysm. Recommend semi-annual imaging followup by CTA or MRA and referral to cardiothoracic surgery if not already obtained. This recommendation follows 2010 ACCF/AHA/AATS/ACR/ASA/SCA/SCAI/SIR/STS/SVM Guidelines for the Diagnosis and Management of Patients With Thoracic Aortic Disease. Circulation. 2010; 121: [id number]. 4. Mild emphysema.      Recent Lab Findings: Lab Results  Component Value Date   WBC 6.9 01/09/2020   HGB 15.0 01/09/2020   HCT 44.6 01/09/2020   PLT 200 01/09/2020   GLUCOSE 96 01/09/2020   CHOL 141 02/05/2016   TRIG 300 (H)  02/05/2016   HDL 26 (L) 02/05/2016   LDLCALC 55 02/05/2016   ALT 10 01/09/2020   AST 15 01/09/2020   NA 138 01/09/2020   K 4.5 01/09/2020   CL 108 01/09/2020   CREATININE 1.06 01/09/2020   BUN 13 01/09/2020   CO2 23 01/09/2020   TSH 5.938 (H) 02/06/2016   INR 1.04 11/16/2016   HGBA1C 5.4 02/05/2016   ECHO:01/2016 LV EF: 50% -   55%  ------------------------------------------------------------------- Indications:      CVA 436.  ------------------------------------------------------------------- Study Conclusions  - Left ventricle: The cavity size was normal. Wall thickness was   normal. Systolic function was normal. The estimated ejection   fraction was in the range of 50% to 55%. Wall motion was normal;   there were no regional wall motion abnormalities. Doppler   parameters are consistent with abnormal left ventricular   relaxation (grade 1 diastolic dysfunction). - Aortic root: The aortic root was mildly dilated. -  Mitral valve: Calcified annulus.  Impressions:  - Technically difficult; LV function low normal to mildly reduced   (EF 50); saline microcavitation study with late bubbles felt to   be nondiagnostic; if clinically indicated, TEE would have greater   sensitivity for source of embolus.  Transthoracic echocardiography.  M-mode, complete 2D, spectral Doppler, and color Doppler.  Birthdate:  Patient birthdate: May 06, 1951.  Age:  Patient is 68 yr old.  Sex:  Gender: male. BMI: 35.8 kg/m^2.  Blood pressure:     123/94  Patient status: Inpatient.  Study date:  Study date: 02/07/2016. Study time: 10:04 AM.  Location:  Bedside.  -------------------------------------------------------------------  ------------------------------------------------------------------- Left ventricle:  The cavity size was normal. Wall thickness was normal. Systolic function was normal. The estimated ejection fraction was in the range of 50% to 55%. Wall motion was  normal; there were no regional wall motion abnormalities. Doppler parameters are consistent with abnormal left ventricular relaxation (grade 1 diastolic dysfunction).  ------------------------------------------------------------------- Aortic valve:   Trileaflet; mildly calcified leaflets. Mobility was not restricted.  Doppler:  Transvalvular velocity was within the normal range. There was no stenosis. There was no regurgitation.   ------------------------------------------------------------------- Aorta:  Aortic root: The aortic root was mildly dilated.  ------------------------------------------------------------------- Mitral valve:   Calcified annulus. Mobility was not restricted. Doppler:  Transvalvular velocity was within the normal range. There was no evidence for stenosis. There was no regurgitation.  ------------------------------------------------------------------- Left atrium:  The atrium was normal in size.  ------------------------------------------------------------------- Right ventricle:  The cavity size was normal. Systolic function was normal.  ------------------------------------------------------------------- Pulmonic valve:    Doppler:  Transvalvular velocity was within the normal range. There was no evidence for stenosis.  ------------------------------------------------------------------- Tricuspid valve:   Structurally normal valve.    Doppler: Transvalvular velocity was within the normal range. There was trivial regurgitation.  ------------------------------------------------------------------- Pulmonary artery:   Systolic pressure was within the normal range.   ------------------------------------------------------------------- Right atrium:  The atrium was normal in size.  ------------------------------------------------------------------- Pericardium:  There was no pericardial  effusion.  ------------------------------------------------------------------- Systemic veins: Inferior vena cava: The vessel was normal in size.  ------------------------------------------------------------------- Measurements   Left ventricle                         Value        Reference  LV ID, ED, PLAX chordal        (H)     52.3  mm     43 - 52  LV ID, ES, PLAX chordal        (H)     43.2  mm     23 - 38  LV fx shortening, PLAX chordal (L)     17    %      >=29  LV PW thickness, ED                    10.2  mm     ---------  IVS/LV PW ratio, ED                    1.04         <=1.3  LV ejection fraction, 1-p A4C          47    %      ---------  LV e&', lateral                         12.4  cm/s   ---------  LV E/e&', lateral                       4.73         ---------  LV e&', medial                          6.85  cm/s   ---------  LV E/e&', medial                        8.57         ---------  LV e&', average                         9.63  cm/s   ---------  LV E/e&', average                       6.1          ---------    Ventricular septum                     Value        Reference  IVS thickness, ED                      10.6  mm     ---------    LVOT                                   Value        Reference  LVOT ID, S                             23    mm     ---------  LVOT area                              4.15  cm^2   ---------    Aorta                                  Value        Reference  Aortic root ID, ED                     38    mm     ---------    Left atrium                            Value        Reference  LA ID, A-P, ES                         38    mm     ---------  LA ID/bsa, A-P                         1.51  cm/m^2 <=2.2  LA volume, ES, 1-p A4C                 45.9  ml     ---------  LA volume/bsa, ES, 1-p A4C             18.3  ml/m^2 ---------    Mitral valve                           Value        Reference  Mitral E-wave peak velocity             58.7  cm/s   ---------  Mitral A-wave peak velocity            67.4  cm/s   ---------  Mitral deceleration time       (H)     313   ms     150 - 230  Mitral E/A ratio, peak                 0.9          ---------    Systemic veins                         Value        Reference  Estimated CVP                          3     mm Hg  ---------    Right ventricle                        Value        Reference  TAPSE                                  15.3  mm     ---------  RV s&', lateral, S                      11.7  cm/s   ---------  Legend: (L)  and  (H)  mark values outside specified reference range.  ------------------------------------------------------------------- Prepared and Electronically Authenticated by  Kirk Ruths 2017-02-12T15:08:13    *[facility]*                         [address], [place] [address]                            [phone]  ------------------------------------------------------------------- Noninvasive Vascular Lab  Transcranial Duplex Study  Patient:    Blayk, Riley MR #:       [id number] Study Date: 02/07/2016 Gender:     M Age:        9 Height: Weight: BSA: Pt. Status: Room:       [id number]   ATTENDING    Eliezer Bottom [id number]  SONOGRAPHER  Landry Mellow, RVT, RDMS  Rachel Bo  Teressa Senter  Reports also to:  ------------------------------------------------------------------- History and indications:  Indications  Stroke patient with lower extremity  DVT.  ------------------------------------------------------------------- Study information:  Study status:  Routine.  Procedure:  A vascular evaluation was performed. The right ICA, right ophthalmic, right middle cerebral, right anterior cerebral, right posterior cerebral, left ICA, left ophthalmic, left middle cerebral,  left anterior cerebral, and left posterior cerebral arteries were studied. Image quality was excellent.    Transcranial duplex study.  Birthdate:  Patient birthdate: [birth year].  Age:  Patient is 69 yr old.  Sex:  Gender: male.  Study date:  Study date: 02/07/2016. Study time: 02:36 PM. Location:  Vascular laboratory.  Patient status:  Inpatient.   ------------------------------------------------------------------- Summary:  - Transcranial Doppler Bubble study.   Performed by Dr. Erlinda Hong.     Verbal consent taken and risks/ benefits explained.     Left forearm IV was used for procedure.   Right Middle Cerebral artery was insonated.     HITS heard at rest: Spencer degree I   HITS heard during valsalva: Spencer degree IV -V     PFO size: small size at rest, large size during valsalva. - Postive Transcranial Doppler study indicative of a large right to   left intracardiac shunt with valsalava manouvre.  Prepared and Electronically Authenticated by  Antony Contras MD    Aortic Size Index=     4.7    /Body surface area is 2.31 meters squared. 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  CTA shows a stable dilated  ascending aortic. Secondary changes noted-  Small nodule in the left chest (image 81, series 7) this measures 8 mm previously approximately 6 mm and Increasing subpleural reticulation in both lungs. 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   We again reviewed the need to have good blood pressure control, avoid strenuous lifting and avoid quinolones.   Grace Isaac MD      [address] [place] [address] Office [phone]   Beeper [phone]  02/26/2020 11:58 AM

## 2020-02-20 NOTE — Telephone Encounter (Signed)
TC from [facility] imaging stating Pt was there for Ct but Pt was not informed to drink contrast. Scan rescheduled.

## 2020-02-26 ENCOUNTER — Telehealth: Payer: Self-pay | Admitting: Cardiothoracic Surgery

## 2020-02-26 ENCOUNTER — Encounter: Payer: Self-pay | Admitting: Cardiothoracic Surgery

## 2020-02-26 NOTE — Telephone Encounter (Signed)
Called patient to update him on the final reading of CT chest by radiology, especially in regard to infiltrative  process - likely residual from covid infection in late 2020- Have moved up follow up chest scan to 6 months   Grace Isaac MD      [address] [place] [address] Office [phone]

## 2020-03-03 ENCOUNTER — Other Ambulatory Visit: Payer: Self-pay

## 2020-03-03 ENCOUNTER — Ambulatory Visit
Admission: RE | Admit: 2020-03-03 | Discharge: 2020-03-03 | Disposition: A | Discharge status: Home / Self Care | Payer: Medicare HMO | Source: Ambulatory Visit | Attending: Hematology | Admitting: Hematology

## 2020-03-03 ENCOUNTER — Other Ambulatory Visit: Payer: Self-pay | Admitting: Hematology

## 2020-03-03 DIAGNOSIS — C187 Malignant neoplasm of sigmoid colon: Secondary | ICD-10-CM

## 2020-03-03 DIAGNOSIS — C189 Malignant neoplasm of colon, unspecified: Secondary | ICD-10-CM | POA: Diagnosis not present

## 2020-03-03 MED ORDER — IOPAMIDOL (ISOVUE-300) INJECTION 61%
100.0000 mL | Freq: Once | INTRAVENOUS | Status: AC | PRN
  Administered 2020-03-03: 100 mL via INTRAVENOUS

## 2020-03-17 DIAGNOSIS — E039 Hypothyroidism, unspecified: Secondary | ICD-10-CM | POA: Diagnosis not present

## 2020-05-05 ENCOUNTER — Telehealth: Payer: Self-pay | Admitting: *Deleted

## 2020-05-05 NOTE — Telephone Encounter (Signed)
05/05/2020 at 3:05pm - EAQ162CD month 24 study notes- The research nurse contacted the study team at [facility] about this pt.  They stated that he completed his baseline and month 3 surveys, but he has not completed his 6, 12 or his 24 month surveys.  The research nurse called the pt to inform him that his 2 year participation in the study ended in April.  The pt was asked if he has received or completed his month 64 survey.  The pt said that he has not received it in the mail. The nurse asked the pt if he wanted to officially end his participation in the study after this call.  He said that if he was supposed to complete a month 24 survey, then he would do that for the study.  The research nurse confirmed his mailing address.  The pt said that he has been doing well lately.  He said that he is very active.  He said that he just mowed his grass this afternoon.  The pt was reminded that he is scheduled to see Dr. Burr Medico for his routine follow up on 07/08/20.  The pt verbalized understanding.  He was very appreciative of all the care that he has received at the [facility].  He denied any changes to his health insurance.  The research nurse then called and spoke to Audrea Muscat at [facility] about this pt.  The nurse provided his correct mailing address to Bevely Palmer, and she said that she would mail the pt his month 93 survey tomorrow while she is in the office.   Brion Aliment RN, BSN, CCRP Clinical Research Nurse 05/05/2020 3:26 PM

## 2020-06-26 ENCOUNTER — Other Ambulatory Visit: Payer: Self-pay | Admitting: Pharmacist

## 2020-07-08 ENCOUNTER — Other Ambulatory Visit: Payer: Medicare HMO

## 2020-07-08 ENCOUNTER — Ambulatory Visit: Payer: Medicare HMO | Admitting: Hematology

## 2020-07-10 NOTE — Progress Notes (Signed)
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 Watt Climes -An infiltrative non-obstructing small mass was found in the mid sigmoid colon. Mass was partially circumferential (involving one third of the lumen circumference). The mass measured 2 cm in length, diameter was 2 mm. No bleeding was present.   -external and internal hemorrhoids -diverticulosis in the sigmoid colon, in the descending colon, in the transverse colon, and at the hepatic flexure -one medium polyp in the mid transverse colon  -ten small polyps in the rectum, in the sigmoid colon, in the descending colon, and in the transverse colon -likely malignant tumor in the mid sigmoid colon -exam was otherwise normal    02/26/2018 Initial Biopsy   From colonoscopy: 1. LG intestine- transverse colon, descending, polyp -Tubular adenomas (8). Sessile serrated adenoma/polyp 2. LG intestine - sigmoid colon bx -INVASIVE WELL DIFFERENTIATED ADENOCARCINOMA 3. LG intestine -sigmoid colon, rectum, polyp -Tubular adenoma with high grade dysplasia. Hypoplastic polyps (3).   02/26/2018 Initial Diagnosis   Colon cancer ([place])   02/28/2018 Imaging   CT AP IMPRESSION: -No evidence of metastatic disease or other acute findings. -Colonic diverticulosis, without radiographic evidence of diverticulitis. -Mild  hepatic steatosis. -Mildly enlarged prostate.    03/07/2018 Tumor Marker   CEA 1.3 (pre-op)   03/08/2018 Surgery   PROCEDURE:  Procedure(s): LAPAROSCOPIC ASSISTED SIGMOID COLECTOMY ERAS PATHWAY  MOBILIZATION OF SPLENIC FLEXURE RIGID SIGMOIDOSCOPY   03/08/2018 Cancer Staging   Staging form: Colon and Rectum, AJCC 8th Edition - Pathologic stage from 03/08/2018: Stage IIIB (pT3, pN1b, cM0) - Signed by Alla Feeling, NP on 04/05/2018   03/08/2018 Pathology Results   Diagnosis 1. Colon, segmental resection for tumor, sigmoid, stitch marks distal - INVASIVE ADENOCARCINOMA, MODERATELY DIFFERENTIATED, SPANNING 2.4 CM. - TUMOR FOCALLY INVADES THROUGH MUSCULARIS PROPRIA. - RESECTION MARGIN IS NEGATIVE. - METASTATIC CARCINOMA IN TWO OF TWENTY LYMPH NODES (2/20). - DIVERTICULOSIS. - HYPERPLASTIC POLYP (X2). - SEE ONCOLOGY TABLE. -MMR normal    04/09/2018 Imaging   CT Chest WO Contrast 04/09/18 IMPRESSION: 1. Stable exam. Scattered bilateral tiny pulmonary nodules without interval change. No new or progressive pulmonary nodule or mass. Attention on follow-up may be warranted. 2. Ascending thoracic aorta measures up to 4.9 cm maximum diameter, increased from 4.6 cm on prior study. Ascending thoracic aortic aneurysm. Recommend semi-annual imaging followup by CTA or MRA and referral to cardiothoracic surgery if not already obtained. This recommendation follows 2010 ACCF/AHA/AATS/ACR/ASA/SCA/SCAI/SIR/STS/SVM Guidelines for the Diagnosis and Management of Patients With Thoracic Aortic Disease. Circulation. 2010; 121: F537-H432   04/13/2018 - 06/29/2018 Chemotherapy   Adjuvant CAPOX every 3 weeks. Oxaliplatin 117m/m2, 2000 mg Xeloda BID on days 1-14, every 21 days starting 04/13/18. Dose reduced for first cycle. Last oxaliplatin treatment 06/15/18 at reduced dose due to neuropathy and fatigue, complete Xeloda on 06/29/18   06/04/2018 Genetic Testing   06/04/18 Testing did not reveal a pathogenic  mutation in any of the genes analyzed. A copy of the genetic test report will be scanned into Epic under the Media tab.  The genes analyzed were the 83 genes on [facility]'s Multi-Cancer panel (ALK, APC, ATM, AXIN2, BAP1, BARD1, BLM, BMPR1A, BRCA1, BRCA2, BRIP1, CASR, CDC73, CDH1, CDK4, CDKN1B, CDKN1C, CDKN2A, CEBPA, CHEK2, CTNNA1, DICER1, DIS3L2, EGFR, EPCAM, FH, FLCN, GATA2, GPC3, GREM1, HOXB13, HRAS, KIT, MAX, MEN1, MET, MITF, MLH1, MSH2, MSH3, MSH6, MUTYH, NBN, NF1, NF2, NTHL1, PALB2, PDGFRA, PHOX2B, PMS2, POLD1, POLE, POT1, PRKAR1A, PTCH1, PTEN, RAD50, RAD51C, RAD51D, RB1, RECQL4, RET, RUNX1, SDHA, SDHAF2, SDHB, SDHC, SDHD, SMAD4, SMARCA4, SMARCB1, SMARCE1, STK11, SUFU, TERC, TERT, TMEM127, TP53, TSC1, TSC2, VHL, WRN, WT1).    01/23/2019 Imaging   CT AP W Contrast 01/23/19  IMPRESSION: Status post partial left hemicolectomy. No evidence of recurrent or metastatic disease.  Additional stable ancillary findings as above. Dedicated CTA chest reported separately.   CT Angio Chest 01/23/19  IMPRESSION: 1. Stable aneurysmal disease of the ascending thoracic aorta measuring 4.7 cm in greatest diameter. No dissection. 2. Stable mild cardiac enlargement. 3. Stable small bilateral pulmonary nodules demonstrating stability since January, 2018. Aortic aneurysm NOS (ICD10-I71.9).   02/20/2020 Imaging   CT Angio Chest  IMPRESSION: 1. Ascending thoracic aorta at 4.7 cm, stable. 2. Stable mild cardiac enlargement. 3. Increasing subpleural reticulation in both lungs since the previous study may represent developing fibrosis. But is nonspecific. Correlate with any recent infection and consider three-month follow-up with high-resolution chest CT. This could also be utilized to evaluate the small peripheral nodule seen in the left chest and the slightly enlarged nodule seen in the left upper lobe as described. 4. Nodular hepatic contours similar to prior exam.   Aortic Atherosclerosis  (ICD10-I70.0).   03/03/2020 Imaging   CT AP IMPRESSION: 1. No evidence of recurrent or metastatic carcinoma within the abdomen or pelvis. 2. Stable moderately enlarged prostate.  No acute findings.        CURRENT THERAPY:  Surveillance  INTERVAL HISTORY:  Christian Weeks is here for a follow up of colon cancer. He was last seen by me 6 months ago. He presents to the clinic alone. He notes he is doing well. He denies any pain or abdominal changes. She is active with his part time yard work. He is notes he is eating well and gaining weight. He continues to f/u with Dr Servando Snare for her heart and lungs. He is currently being watched for milky liquid at base of lungs. 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  He did have COVID19 in the past year. He notes he has received both his COVID19 vaccine.     REVIEW OF SYSTEMS:   Constitutional: Denies fevers, chills or abnormal weight loss Eyes: Denies blurriness of vision Ears, nose, mouth, throat, and face: Denies mucositis or sore throat Respiratory: Denies cough, dyspnea or wheezes Cardiovascular: Denies palpitation, chest discomfort or lower extremity swelling Gastrointestinal:  Denies nausea, heartburn or change in bowel habits Skin: Denies abnormal skin rashes Lymphatics: Denies new lymphadenopathy or easy bruising Neurological:Denies numbness, tingling or new weaknesses Behavioral/Psych: Mood is stable, no new changes  All other systems were reviewed with the patient and are negative.  MEDICAL HISTORY:  Past Medical History:  Diagnosis Date  . Arthritis   . Blood clot in vein 01/2016  . Colon cancer ([place])   . Colon polyps   . Degenerative joint disease   . Genetic testing 06/11/18   Multi-Cancer panel (83 genes) @  [facility] - No pathogenic mutations detected  . History of kidney stones age 29's  . Hypertension   . Hypothyroidism   . Mass of colon    sigmoid  . Stroke ([place]) 01/2016   speech difficulty for a few  hours after knee replacement left  . Thoracic ascending aortic aneurysm ([facility])    4.6-4.7 cm followed by Dr Servando Snare- LOV- 08/2017-epic   . Wears dentures   . Wears glasses     SURGICAL HISTORY: Past Surgical History:  Procedure Laterality Date  . colonscopy  02/26/2018  . KNEE ARTHROPLASTY Left 01/15/2016   Procedure: COMPUTER ASSISTED TOTAL KNEE ARTHROPLASTY;  Surgeon: [name], MD;  Location: [facility];  Service: Orthopedics;  Laterality: Left;  . LAPAROSCOPIC SIGMOID COLECTOMY N/A 03/08/2018   Procedure: LAPAROSCOPIC ASSISTED SIGMOID COLECTOMY ERAS PATHWAY;  Surgeon: Jovita Kussmaul, MD;  Location: [facility];  Service: General;  Laterality: N/A;  . MULTIPLE TOOTH EXTRACTIONS    . TOTAL KNEE ARTHROPLASTY Right 01/13/2017   Procedure: RIGHT TOTAL KNEE ARTHROPLASTY;  Surgeon: [name], MD;  Location: [facility];  Service: Orthopedics;  Laterality: Right;    I have reviewed the social history and family history with the patient and they are unchanged from previous note.  ALLERGIES:  is allergic to quinolones and poison oak extract.  MEDICATIONS:  Current Outpatient Medications  Medication Sig Dispense Refill  . levothyroxine (SYNTHROID) 112 MCG tablet Take 112 mcg by mouth daily before breakfast.    . acetaminophen (TYLENOL) 500 MG tablet Take 1,000 mg by mouth every 6 (six) hours as needed for moderate pain or headache.     . Cholecalciferol (VITAMIN D3) 3000 units TABS Take 3,000 Units by mouth daily.     No current facility-administered medications for this visit.    PHYSICAL EXAMINATION: ECOG PERFORMANCE STATUS: 0 - Asymptomatic  Vitals:   07/16/20 0903  BP: (!) 123/93  Pulse: 60  Resp: 17  Temp: 97.8 F (36.6 C)  SpO2: 98%   Filed Weights   07/16/20 0903  Weight: 245 lb (111.1 kg)    GENERAL:alert, no distress and comfortable SKIN: skin color, texture, turgor are normal, no rashes or significant lesions EYES: normal, Conjunctiva are pink and non-injected, sclera clear    NECK: supple, thyroid normal size, non-tender, without nodularity LYMPH:  no palpable lymphadenopathy in the cervical, axillary  LUNGS: clear to auscultation and percussion with normal breathing effort HEART: regular rate & rhythm and no murmurs and no lower extremity edema ABDOMEN:abdomen soft, non-tender and normal bowel sounds Musculoskeletal:no cyanosis of digits and no clubbing  NEURO: alert & oriented x 3 with fluent speech, no focal motor/sensory deficits  LABORATORY DATA:  I have reviewed the data as listed CBC Latest Ref Rng & Units 07/16/2020 01/09/2020 07/25/2019  WBC 4.0 - 10.5 K/uL 6.3 6.9 6.0  Hemoglobin 13.0 - 17.0 g/dL 16.1 15.0 16.0  Hematocrit 39 - 52 % 47.4 44.6 46.7  Platelets 150 - 400 K/uL 167 200 171     CMP Latest Ref Rng & Units 07/16/2020 01/09/2020 07/25/2019  Glucose 70 - 99 mg/dL 77 96 95  BUN 8 - 23 mg/dL '12 13 14  ' Creatinine 0.61 - 1.24 mg/dL 1.20 1.06 1.10  Sodium 135 - 145 mmol/L 139 138 141  Potassium 3.5 - 5.1 mmol/L 4.2 4.5 4.2  Chloride 98 - 111 mmol/L 107 108 109  CO2 22 - 32 mmol/L '25 23 23  ' Calcium 8.9 - 10.3 mg/dL 9.4 8.4(L) 9.3  Total  Protein 6.5 - 8.1 g/dL 7.1 7.1 7.1  Total Bilirubin 0.3 - 1.2 mg/dL 0.6 0.8 0.8  Alkaline Phos 38 - 126 U/L 89 94 106  AST 15 - 41 U/L '20 15 19  ' ALT 0 - 44 U/L '15 10 17      ' RADIOGRAPHIC STUDIES: I have personally reviewed the radiological images as listed and agreed with the findings in the report. No results found.   ASSESSMENT & PLAN:  Christian Weeks is a 69 y.o. male with    1. Sigmoid colon adenocarcinoma, well differentiated, pT3N1b,M0, stage IIIB, MMR-normal -He was diagnosed in 02/2018. He is s/p sigmoidhemicolectomy and adjuvantchemo withCAPOX.  -Hehas recovered very well from chemotherapywith now mildresidualintermittent tinglingfingertips -His 06/2019 colonoscopy showed hemorrhoids, sigmoid diverticulosis and adenoma and hyperplastic polyps. Repeat in 2022.  -He is clinically doing  well. Labs reviewed, CBC and CMP WNL. CEA is still pending. His physical exam is unremarkable. There is no clinical concern for recurrence.  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  -He is almost 2.5 years since his diagnosis. I reviewed his risk of recurrence will significantly reduce after 2-3 years NED.Plan for CT scan in 02/2021.  -F/u in4 months then 8 months.   2. GeneticTesting was negative  3. HTN, thoracic aneurysm -Stable 4.6 ascending thoracic aortic aneurysm followed by Dr. Servando Snare  -BP well controlled on losartan per PCP   4. 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 OnEliquisfrom 01/2016 - 08/2017. No recurrent edema. Followed by PCP and neuro.  -He remains active and has no concerning symptoms for blood clots.   5. COVID19 positive in 10/2019 -He has received both his COVID19 vaccines.    PLAN: -He is clinically doing well.  -Lab and f/u with NP in 4 months, will order next scan at that time.    No problem-specific Assessment & Plan notes found for this encounter.   No orders of the defined types were placed in this encounter.  All questions were answered. The patient knows to call the clinic with any problems, questions or concerns. No barriers to learning was detected. The total time spent in the appointment was 25 minutes.     Truitt Merle, MD 07/16/2020   I, Joslyn Devon, am acting as scribe for Truitt Merle, MD.   I have reviewed the above documentation for accuracy and completeness, and I agree with the above.

## 2020-07-16 ENCOUNTER — Inpatient Hospital Stay: Payer: Medicare HMO | Attending: Hematology

## 2020-07-16 ENCOUNTER — Other Ambulatory Visit: Payer: Self-pay

## 2020-07-16 ENCOUNTER — Inpatient Hospital Stay: Payer: Medicare HMO | Admitting: Hematology

## 2020-07-16 ENCOUNTER — Encounter: Payer: Self-pay | Admitting: Hematology

## 2020-07-16 ENCOUNTER — Encounter: Payer: Self-pay | Admitting: *Deleted

## 2020-07-16 ENCOUNTER — Telehealth: Payer: Self-pay | Admitting: Hematology

## 2020-07-16 VITALS — BP 123/93 | HR 60 | Temp 97.8°F | Resp 17 | Ht 72.0 in | Wt 245.0 lb

## 2020-07-16 DIAGNOSIS — Z9221 Personal history of antineoplastic chemotherapy: Secondary | ICD-10-CM | POA: Insufficient documentation

## 2020-07-16 DIAGNOSIS — C187 Malignant neoplasm of sigmoid colon: Secondary | ICD-10-CM

## 2020-07-16 DIAGNOSIS — Z8673 Personal history of transient ischemic attack (TIA), and cerebral infarction without residual deficits: Secondary | ICD-10-CM | POA: Diagnosis not present

## 2020-07-16 DIAGNOSIS — Z8616 Personal history of COVID-19: Secondary | ICD-10-CM | POA: Diagnosis not present

## 2020-07-16 DIAGNOSIS — C772 Secondary and unspecified malignant neoplasm of intra-abdominal lymph nodes: Secondary | ICD-10-CM

## 2020-07-16 DIAGNOSIS — Z79899 Other long term (current) drug therapy: Secondary | ICD-10-CM | POA: Diagnosis not present

## 2020-07-16 DIAGNOSIS — E039 Hypothyroidism, unspecified: Secondary | ICD-10-CM | POA: Diagnosis not present

## 2020-07-16 DIAGNOSIS — Z85038 Personal history of other malignant neoplasm of large intestine: Secondary | ICD-10-CM | POA: Diagnosis not present

## 2020-07-16 DIAGNOSIS — Z96653 Presence of artificial knee joint, bilateral: Secondary | ICD-10-CM | POA: Insufficient documentation

## 2020-07-16 DIAGNOSIS — Z86718 Personal history of other venous thrombosis and embolism: Secondary | ICD-10-CM | POA: Diagnosis not present

## 2020-07-16 DIAGNOSIS — Z9049 Acquired absence of other specified parts of digestive tract: Secondary | ICD-10-CM | POA: Diagnosis not present

## 2020-07-16 DIAGNOSIS — I10 Essential (primary) hypertension: Secondary | ICD-10-CM | POA: Diagnosis not present

## 2020-07-16 DIAGNOSIS — N400 Enlarged prostate without lower urinary tract symptoms: Secondary | ICD-10-CM | POA: Diagnosis not present

## 2020-07-16 LAB — CBC WITH DIFFERENTIAL (CANCER CENTER ONLY)
Abs Immature Granulocytes: 0.02 10*3/uL (ref 0.00–0.07)
Basophils Absolute: 0.1 10*3/uL (ref 0.0–0.1)
Basophils Relative: 1 %
Eosinophils Absolute: 0.4 10*3/uL (ref 0.0–0.5)
Eosinophils Relative: 6 %
HCT: 47.4 % (ref 39.0–52.0)
Hemoglobin: 16.1 g/dL (ref 13.0–17.0)
Immature Granulocytes: 0 %
Lymphocytes Relative: 27 %
Lymphs Abs: 1.7 10*3/uL (ref 0.7–4.0)
MCH: 31 pg (ref 26.0–34.0)
MCHC: 34 g/dL (ref 30.0–36.0)
MCV: 91.2 fL (ref 80.0–100.0)
Monocytes Absolute: 0.8 10*3/uL (ref 0.1–1.0)
Monocytes Relative: 13 %
Neutro Abs: 3.4 10*3/uL (ref 1.7–7.7)
Neutrophils Relative %: 53 %
Platelet Count: 167 10*3/uL (ref 150–400)
RBC: 5.2 MIL/uL (ref 4.22–5.81)
RDW: 13.2 % (ref 11.5–15.5)
WBC Count: 6.3 10*3/uL (ref 4.0–10.5)
nRBC: 0 % (ref 0.0–0.2)

## 2020-07-16 LAB — CMP (CANCER CENTER ONLY)
ALT: 15 U/L (ref 0–44)
AST: 20 U/L (ref 15–41)
Albumin: 3.8 g/dL (ref 3.5–5.0)
Alkaline Phosphatase: 89 U/L (ref 38–126)
Anion gap: 7 (ref 5–15)
BUN: 12 mg/dL (ref 8–23)
CO2: 25 mmol/L (ref 22–32)
Calcium: 9.4 mg/dL (ref 8.9–10.3)
Chloride: 107 mmol/L (ref 98–111)
Creatinine: 1.2 mg/dL (ref 0.61–1.24)
GFR, Est AFR Am: >60 mL/min (ref >60)
GFR, Estimated: >60 mL/min (ref >60)
Glucose, Bld: 77 mg/dL (ref 70–99)
Potassium: 4.2 mmol/L (ref 3.5–5.1)
Sodium: 139 mmol/L (ref 135–145)
Total Bilirubin: 0.6 mg/dL (ref 0.3–1.2)
Total Protein: 7.1 g/dL (ref 6.5–8.1)

## 2020-07-16 LAB — CEA (IN HOUSE-CHCC): CEA (CHCC-In House): 1 ng/mL (ref 0.00–5.00)

## 2020-07-16 NOTE — Research (Signed)
07/16/2020 at 9:59am- [id number] end of study notes-  The pt was into the cancer center this morning for his routine follow appointment with Dr. Burr Medico.  The research nurse met briefly with the pt about his study participation.  The pt said that he completed his month 24 questionnaire, and he mailed it back to the study.  He said that he made sure that he completed the questionnaire in a timely manner.  The pt was thanked for his compliance and support of this study.  The pt was informed that his 2 year study participation was completed.  The pt thanked the nurse for seeing him and letting him know that his participation in the study has been completed.  The research nurse completed the pt's End of Study case report form.   Brion Aliment RN, BSN, CCRP Clinical Research Nurse 07/16/2020 10:05 AM

## 2020-07-16 NOTE — Telephone Encounter (Signed)
Scheduled per los. Gave avs and calendar  

## 2020-07-17 ENCOUNTER — Other Ambulatory Visit: Payer: Self-pay | Admitting: Cardiothoracic Surgery

## 2020-07-17 DIAGNOSIS — R911 Solitary pulmonary nodule: Secondary | ICD-10-CM

## 2020-07-20 ENCOUNTER — Telehealth: Payer: Self-pay

## 2020-07-20 NOTE — Telephone Encounter (Signed)
-----   Message from Truitt Merle, MD sent at 07/18/2020  8:41 PM EDT ----- Please let pt know his tumor marker was normal on 7/22, no concerns, thanks   Truitt Merle  07/18/2020

## 2020-07-20 NOTE — Telephone Encounter (Signed)
Called and spoke with Christian Weeks and notified him that his tumor marker was normal, No concerns at this time. [name] verbalized understanding.

## 2020-08-26 NOTE — Progress Notes (Signed)
[address]       [place] [address]             [phone]                    Greig H Facchini [facility] Medical Record #[id number] Date of Birth: Jan 29, 1951  Referring: [name], MD Primary Care: Deland Pretty, MD  Chief Complaint:    Chief Complaint  Patient presents with  . Thoracic Aortic Aneurysm    6 month f/u with Chest CT    History of Present Illness:    Christian Weeks 69 y.o. male is seen in follow-up for dilated ascending aorta.  He returns today with a follow-up CT scan.  He has history  stage IIIb colon cancer.  He had a surgical resection with 2 out of 20 nodes positive.  He was treated with adjuvant CAPOX every 3 weeks. 2000 mg Xeloda BID on days 1-14, every 21 days .   For evaluation of his colon cancer noncontrast CT scan of the chest was done in April 2019  suggesting that his aorta was 4.9 cm.  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 This resolved fairly quickly. Echocardiogram did NOT confirm a patent foramen on bubble test, transcranial Doppler did. At the time the patient was noted to have a left lower extremity DVT. He notes he was anticoagulated for one month.   He returned in early 2018 to have the right knee replaced, on a preop chest x-ray it was suggested that he had a descending thoracic aneurysm. A CT of the chest was performed, there was no descending thoracic aneurysm there was mild dilatation of the ascending aorta, 4.7 cm. echocardiogram showed a trileaflet  aortic valve without stenosis or insufficiency.  The patient has no family history of aortic aneurysm, Marfan's or other connective tissue disorder, there is no family history of young family members dying suddenly without known cause. Patient notes his mother died of congestive heart failure at age 59 father died at age 86 possibly from a myocardial infarction. One sister died at age 50 with severe COPD, he has to other brothers 100 and 52 and a sister age 29.  His older brother died several weeks ago of dementia.  In the  Fall of  2020 he developed Covid infection but did not require hospitalization.  He noted he lost 20 pounds but now feels like he has recovered he has had no longstanding respiratory issue    Current Activity/ Functional Status:  Patient is independent with mobility/ambulation, transfers, ADL's, IADL's.   Zubrod Score: At the time of surgery this patient's most appropriate activity status/level should be described as: []     0    Normal activity, no symptoms [x]     1    Restricted in physical strenuous activity but ambulatory, able to do out light work []     2    Ambulatory and capable of self care, unable to do work activities,  up and about               >50 % of waking hours                              []     3    Only limited self care, in bed greater than 50% of waking hours []     4    Completely disabled, no self care, confined to bed or chair []     5    Moribund   Past Medical History:  Diagnosis Date  . Arthritis   . Blood clot in vein 01/2016  . Colon cancer ([place])   . Colon polyps   . Degenerative joint disease   . Genetic testing 06/11/18   Multi-Cancer panel (83 genes) @ [facility] - No pathogenic mutations detected  . History of kidney stones age 60's  . Hypertension   . Hypothyroidism   . Mass of colon    sigmoid  . Stroke ([place]) 01/2016   speech difficulty for a few hours after knee replacement left  . Thoracic ascending aortic aneurysm ([facility])     4.6-4.7 cm followed by Dr Servando Snare- LOV- 08/2017-epic   . Wears dentures   . Wears glasses     Past Surgical History:  Procedure Laterality Date  . colonscopy  02/26/2018  . KNEE ARTHROPLASTY Left 01/15/2016   Procedure: COMPUTER ASSISTED TOTAL KNEE ARTHROPLASTY;  Surgeon: [name], MD;  Location: [facility];  Service: Orthopedics;  Laterality: Left;  . LAPAROSCOPIC SIGMOID COLECTOMY N/A 03/08/2018   Procedure: LAPAROSCOPIC ASSISTED SIGMOID COLECTOMY ERAS PATHWAY;  Surgeon: Jovita Kussmaul, MD;  Location: [facility];  Service: General;  Laterality: N/A;  . MULTIPLE TOOTH EXTRACTIONS    . TOTAL KNEE ARTHROPLASTY Right 01/13/2017   Procedure: RIGHT TOTAL KNEE ARTHROPLASTY;  Surgeon: [name], MD;  Location: [facility];  Service: Orthopedics;  Laterality: Right;    Family History  Problem Relation Age of Onset  . Congestive Heart Failure Mother   . Diabetes Mother   . Thyroid disease Sister     Social History   Social History  . Marital status: Widowed- Patient's wife died in 2016/03/26     Spouse name: N/A  . Number of children: N/A  . Years of education: N/A   Occupational History  . Not on file.   Social History Main Topics  . Smoking status: Former Smoker    Quit date: 11/16/1996  . Smokeless tobacco: Never Used  . Alcohol use No  . Drug use: No  . Sexual activity: Not on file      Social History   Tobacco Use  Smoking Status Former Smoker  . Packs/day: 1.50  . Years: 30.00  . Pack years: 45.00  . Types: Cigarettes  . Quit date: 11/16/1996  . Years since quitting: 23.7  Smokeless Tobacco Never Used  Tobacco Comment   quit 25 yrs ago    Social History   Substance and Sexual Activity  Alcohol Use No     Allergies  Allergen Reactions  . Quinolones     Patient was warned about not using Cipro and similar antibiotics. Recent studies have raised concern that fluoroquinolone antibiotics could be associated with an increased risk of aortic aneurysm Fluoroquinolones  have non-antimicrobial properties that might jeopardise the integrity of the extracellular matrix of the vascular wall In a  propensity score matched cohort study in [place], there  was a 66% increased rate of aortic aneurysm or dissection associated with oral fluoroquinolone use, compared wit  . Poison Oak Extract Rash    Current Outpatient Medications  Medication Sig Dispense Refill  . acetaminophen (TYLENOL) 500 MG tablet Take 1,000 mg by mouth every 6 (six) hours as needed for moderate pain or headache.     . Cholecalciferol (VITAMIN D3) 3000 units TABS Take 3,000 Units by mouth daily.    Marland Kitchen levothyroxine (SYNTHROID) 112 MCG tablet Take 112 mcg by mouth daily before breakfast.     No current facility-administered medications for this visit.      Review of Systems:  Review of Systems  Constitutional: Positive for malaise/fatigue.  HENT: Negative.   Eyes: Negative.   Respiratory: Positive for shortness of breath. Negative for cough, hemoptysis, sputum production and wheezing.   Cardiovascular: Negative.   Gastrointestinal: Negative.   Genitourinary: Negative.   Musculoskeletal: Negative.   Skin: Negative.   Neurological: Negative.   Endo/Heme/Allergies: Negative.   Psychiatric/Behavioral: Negative.       Physical Exam: BP 120/80   Pulse 64   Temp 97.6 F (36.4 C) (Skin)   Resp 20   Ht 6' (1.829 m)   Wt 243 lb (110.2 kg)   SpO2 94% Comment: RA  BMI 32.96 kg/m   PHYSICAL EXAMINATION: General appearance: alert, cooperative and no distress Head: Normocephalic, without obvious abnormality, atraumatic Neck: no adenopathy, no carotid bruit, no JVD, supple, symmetrical, trachea midline and thyroid not enlarged, symmetric, no tenderness/mass/nodules Lymph nodes: Cervical, supraclavicular, and axillary nodes normal. Resp: clear to auscultation bilaterally Cardio: regular rate and rhythm, S1, S2 normal, no murmur, click, rub or gallop GI: soft, non-tender; bowel sounds normal;  no masses,  no organomegaly Extremities: extremities normal, atraumatic, no cyanosis or edema and Homans sign is negative, no sign of DVT Neurologic: Grossly normal  Diagnostic Studies & Laboratory data:     Recent Radiology Findings: CT Chest W Contrast  Result Date: 08/27/2020 CLINICAL DATA:  Lung nodule. Colon cancer, prior sigmoid colectomy and chemotherapy. EXAM: CT CHEST WITH CONTRAST TECHNIQUE: Multidetector CT imaging of the chest was performed during intravenous contrast administration. CONTRAST:  73mL ISOVUE-300 IOPAMIDOL (ISOVUE-300) INJECTION 61% COMPARISON:  02/20/2020 FINDINGS: Cardiovascular: Atherosclerotic aortic arch and descending thoracic aorta with mild atherosclerotic calcification in the left anterior descending coronary artery. Ascending aortic aneurysm 4.8 cm in diameter on image 75/2, previously measured at 4.7 cm in diameter. Mild cardiomegaly. Mediastinum/Nodes: Small thyroid gland with 1.4 cm right thyroid nodule on image 11/2. Not clinically significant; no follow-up imaging recommended (ref: J Am Coll Radiol. 2015 Feb;12(2): 143-50). Right infrahilar nodes up to 0.8 cm in short axis on image 98/2, previously 0.9 cm. Upper normal size left infrahilar lymph nodes. Lungs/Pleura: Subpleural reticulations likely from fibrosis. Calcified granuloma in the posterior basal segment left lower lobe on image 136/8, stable. 0.3 cm in short axis subpleural nodularity along the minor fissure on image 86/8, no change from 01/14/2017, benign. 0.5 by 0.4 cm lingular nodule on image 104/8, no change from 01/14/2017, benign. 0.3 cm left upper lobe nodule with adjacent reticulation on image 51/8, reduced size and conspicuity from 02/20/2020 but not present on prior chest CT examinations previous to that exam. This is very likely to be benign but may merit surveillance in the context of the patient's personal history of malignancy. Upper Abdomen: Unremarkable Musculoskeletal: Prominent degenerative  glenohumeral arthropathy bilaterally. IMPRESSION: 1. There is a 0.3 cm left upper lobe nodule with adjacent reticulation, reduced size  and conspicuity from 02/20/2020 but not present on prior chest CT examinations previous to that exam. This is very likely to be benign/postinflammatory, but may merit surveillance in the context of the patient's personal history of malignancy. 2. Other small pulmonary nodules are chronically stable and benign. 3. Ascending thoracic aortic aneurysm, 4.8 cm in diameter. Ascending thoracic aortic aneurysm. Recommend semi-annual imaging followup by CTA or MRA and referral to cardiothoracic surgery if not already obtained. This recommendation follows 2010 ACCF/AHA/AATS/ACR/ASA/SCA/SCAI/SIR/STS/SVM Guidelines for the Diagnosis and Management of Patients With Thoracic Aortic Disease. Circulation. 2010; 121: C623-J628. Aortic aneurysm NOS (ICD10-I71.9) 4. Other imaging findings of potential clinical significance: Coronary atherosclerosis. Mild cardiomegaly. Subpleural reticulations likely from fibrosis. Prominent degenerative glenohumeral arthropathy bilaterally. 5. Aortic atherosclerosis. Aortic Atherosclerosis (ICD10-I70.0). Electronically Signed   By: Van Clines M.D.   On: 08/27/2020 13:48   I have independently reviewed the above radiology studies  and reviewed the findings with the patient.   CT ANGIO CHEST AORTA W/CM & OR WO/CM  Result Date: 02/20/2020 CLINICAL DATA:  Thoracic aortic aneurysm follow-up. EXAM: CT ANGIOGRAPHY CHEST WITH CONTRAST TECHNIQUE: Multidetector CT imaging of the chest was performed using the standard protocol during bolus administration of intravenous contrast. Multiplanar CT image reconstructions and MIPs were obtained to evaluate the vascular anatomy. CONTRAST:  58mL ISOVUE-370 IOPAMIDOL (ISOVUE-370) INJECTION 76% COMPARISON:  05/15/2018, 01/23/2019 FINDINGS: Cardiovascular: Ascending thoracic aorta at 4.7 cm. Tortuous descending thoracic aorta,  maximal caliber 2.8 cm. No signs of dissection. Heart size remains mildly enlarged without pericardial effusion. Central pulmonary arteries are of normal caliber. Mediastinum/Nodes: No signs of adenopathy in the chest. Scattered lymph nodes throughout the chest as before. Lungs/Pleura: Signs of subpleural reticulation in the peripheral left chest with stable small lingular nodule (image 112, series 7) 7 mm. Interval development of small area of nodularity in the subpleural left chest upper lobe, (image 54, series 7) 7 mm. Increasing subpleural reticulation bilaterally since the previous study. There is also some atelectatic change on today's exam Small nodule in the left chest (image 81, series 7) this measures 8 mm previously approximately 6 mm no signs of pleural effusion. Airways are patent. Upper Abdomen: Lobular hepatic contours. Spleen is normal size. Moderate-sized duodenal diverticulum. Normal appearance of the adrenal glands. Mild cortical scarring in the visualized portions of the kidneys. No signs of acute process in the upper abdomen. Musculoskeletal: No signs of chest wall mass. No acute bone process. Spinal degenerative change. Degenerative changes in the glenohumeral joints bilaterally. Review of the MIP images confirms the above findings. IMPRESSION: 1. Ascending thoracic aorta at 4.7 cm, stable. 2. Stable mild cardiac enlargement. 3. Increasing subpleural reticulation in both lungs since the previous study may represent developing fibrosis. But is nonspecific. Correlate with any recent infection and consider three-month follow-up with high-resolution chest CT. This could also be utilized to evaluate the small peripheral nodule seen in the left chest and the slightly enlarged nodule seen in the left upper lobe as described. 4. Nodular hepatic contours similar to prior exam. Aortic Atherosclerosis (ICD10-I70.0). Electronically Signed   By: Zetta Bills M.D.   On: 02/20/2020 17:31   Ct Abdomen  Pelvis W Contrast  Result Date: 01/23/2019 CLINICAL DATA:  Colon cancer, status post partial colon resection and chemotherapy EXAM: CT ABDOMEN AND PELVIS WITH CONTRAST TECHNIQUE: Multidetector CT imaging of the abdomen and pelvis was performed using the standard protocol following bolus administration of intravenous contrast. CONTRAST:  169mL ISOVUE-370 IOPAMIDOL (ISOVUE-370) INJECTION 76% COMPARISON:  02/28/2018 FINDINGS: Lower  chest: Dedicated CTA chest described separately. Hepatobiliary: Liver is within normal limits. No suspicious/enhancing hepatic lesions. Gallbladder is unremarkable. No intrahepatic or extrahepatic ductal dilatation. Pancreas: Within normal limits. Spleen: In normal limits. Adrenals/Urinary Tract: Adrenal glands are within normal limits. 8 mm cyst in the anterior interpolar right kidney (series 7/image 36). Left kidney is within normal limits. No hydronephrosis. Bladder is underdistended but unremarkable. Stomach/Bowel: Stomach is within normal limits. No evidence of bowel obstruction. Normal appendix (series 7/image 47). Scattered duodenal diverticuli. Status post partial left hemicolectomy with suture line in the lower pelvis (series 7/image 72). Mild left colonic diverticulosis, without evidence of diverticulitis. Vascular/Lymphatic: No evidence of abdominal aortic aneurysm. Atherosclerotic calcifications of the abdominal aorta and branch vessels. No suspicious abdominopelvic lymphadenopathy. Reproductive: Prostatomegaly, with enlargement the central gland, indenting the base the bladder. Other: No abdominopelvic ascites. Musculoskeletal: Mild degenerative changes the lumbar spine. IMPRESSION: Status post partial left hemicolectomy. No evidence of recurrent or metastatic disease. Additional stable ancillary findings as above. Dedicated CTA chest reported separately. Electronically Signed   By: Julian Hy M.D.   On: 01/23/2019 12:28   Ct Angio Chest Aorta W/cm &/or  Wo/cm  Result Date: 01/23/2019 CLINICAL DATA:  History aneurysmal disease of the ascending thoracic aorta previously measuring approximately 4.6-4.7 cm by prior CTA. History of sigmoid colonic adenocarcinoma and status post resection and chemotherapy. EXAM: CT ANGIOGRAPHY CHEST WITH CONTRAST TECHNIQUE: Multidetector CT imaging of the chest was performed using the standard protocol during bolus administration of intravenous contrast. Multiplanar CT image reconstructions and MIPs were obtained to evaluate the vascular anatomy. CONTRAST:  14mL ISOVUE-370 IOPAMIDOL (ISOVUE-370) INJECTION 76% COMPARISON:  05/15/2018 and additional prior studies. FINDINGS: Cardiovascular: The aortic root remains normal in caliber at the level of the sinuses of Valsalva and measures approximately 3.6-3.7 cm. The ascending thoracic aorta shows stable aneurysmal disease measuring approximately 4.7 cm in greatest diameter. The proximal arch measures 3.9 cm and the distal arch 3.3 cm. The descending thoracic aorta measures 3 cm. No evidence of dissection. Proximal great vessels demonstrate stable and normal patency and normal branching anatomy. Stable mild cardiac enlargement. No pericardial fluid identified. No significant calcified coronary artery plaque identified. Central pulmonary arteries are normal in caliber. Mediastinum/Nodes: No enlarged mediastinal, hilar, or axillary lymph nodes. Thyroid gland, trachea, and esophagus demonstrate no significant findings. Lungs/Pleura: Stable 3 mm subpleural inferior right upper lobe nodule, 6 mm nodularity along the minor fissure, 6 mm subpleural inferior lingular nodule and 5 mm medial left upper lobe nodule. No new pulmonary nodules. There is no evidence of pulmonary edema, consolidation, pneumothorax or pleural fluid. Upper Abdomen: No acute abnormality. Musculoskeletal: No chest wall abnormality. No acute or significant osseous findings. Review of the MIP images confirms the above findings.  IMPRESSION: 1. Stable aneurysmal disease of the ascending thoracic aorta measuring 4.7 cm in greatest diameter. No dissection. 2. Stable mild cardiac enlargement. 3. Stable small bilateral pulmonary nodules demonstrating stability since January, 2018. Aortic aneurysm NOS (ICD10-I71.9). Electronically Signed   By: Aletta Edouard M.D.   On: 01/23/2019 11:40   I have independently reviewed the above radiology studies  and reviewed the findings with the patient.    Ct Angio Chest Aorta W &/or Wo Contrast  Result Date: 05/15/2018 CLINICAL DATA:  Thoracic aortic aneurysm without rupture. EXAM: CT ANGIOGRAPHY CHEST WITH CONTRAST TECHNIQUE: Multidetector CT imaging of the chest was performed using the standard protocol during bolus administration of intravenous contrast. Multiplanar CT image reconstructions and MIPs were obtained to evaluate the  vascular anatomy. CONTRAST:  75 mL of Isovue 370 intravenously. COMPARISON:  CT scan of April 09, 2018 and September 21, 2017. FINDINGS: Cardiovascular: 4.7 cm ascending thoracic aortic aneurysm is noted. No dissection is noted. Great vessels are widely patent. Transverse thoracic aorta measures 3.2 cm. Proximal descending thoracic aorta measures 2.7 cm. Normal pericardial effusion is noted. Mediastinum/Nodes: No enlarged mediastinal, hilar, or axillary lymph nodes. Thyroid gland, trachea, and esophagus demonstrate no significant findings. Lungs/Pleura: Lungs are clear. No pleural effusion or pneumothorax. Upper Abdomen: No acute abnormality. Musculoskeletal: No chest wall abnormality. No acute or significant osseous findings. Review of the MIP images confirms the above findings. IMPRESSION: 4.7 cm ascending thoracic aortic aneurysm. Ascending thoracic aortic aneurysm. Recommend semi-annual imaging followup by CTA or MRA and referral to cardiothoracic surgery if not already obtained. This recommendation follows 2010 ACCF/AHA/AATS/ACR/ASA/SCA/SCAI/SIR/STS/SVM Guidelines for  the Diagnosis and Management of Patients With Thoracic Aortic Disease. Circulation. 2010; 121: Z610-R604. Electronically Signed   By: Marijo Conception, M.D.   On: 05/15/2018 14:40    Ct Angio Chest Aorta W/cm &/or Wo/cm  Result Date: 09/21/2017 CLINICAL DATA:  Follow-up thoracic aortic aneurysm EXAM: CT ANGIOGRAPHY CHEST WITH CONTRAST TECHNIQUE: Multidetector CT imaging of the chest was performed using the standard protocol during bolus administration of intravenous contrast. Multiplanar CT image reconstructions and MIPs were obtained to evaluate the vascular anatomy. CONTRAST:  80 cc Isovue 370 IV COMPARISON:  01/14/2017 FINDINGS: Cardiovascular: Stable ascending thoracic aorta, measuring up to 4.6 cm compared to 4.7 cm previously. No evidence of dissection. Heart is borderline in size. Scattered aortic and coronary artery calcifications. Mediastinum/Nodes: No mediastinal, hilar, or axillary adenopathy. Lungs/Pleura: Linear areas of scarring in the lingula. Lingular nodule measures 5 mm on image 78, stable. Nodule along the right minor fissure measures 5 mm on image 67, stable slight nodularity along the left major fissure on image 59 is stable. Left upper lobe nodule on image 54 measures 5 mm, stable. No new or enlarging pulmonary nodules. No pleural effusions. Upper Abdomen: Imaging into the upper abdomen shows no acute findings. Musculoskeletal: Chest wall soft tissues are unremarkable. No acute bony abnormality. Review of the MIP images confirms the above findings. 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 Recommend semi-annual imaging followup by CTA or MRA and referral to cardiothoracic surgery if not already obtained. This recommendation follows 2010 ACCF/AHA/AATS/ACR/ASA/SCA/SCAI/SIR/STS/SVM Guidelines for the Diagnosis and Management of Patients With Thoracic Aortic Disease. Circulation. 2010; 121: V409-W119 Scattered small pulmonary nodules are stable. No follow-up needed if  patient is low-risk (and has no known or suspected primary neoplasm). Non-contrast chest CT can be considered in 12 months if patient is high-risk. This recommendation follows the consensus statement: Guidelines for Management of Incidental Pulmonary Nodules Detected on CT Images: From the Fleischner Society 2017; Radiology 2017; 284:228-243. Electronically Signed   By: Rolm Baptise M.D.   On: 09/21/2017 15:20        CLINICAL DATA:  Unexplained retrocardiac density on a recent lateral chest radiograph view. Inpatient.  EXAM: CT CHEST WITH CONTRAST  TECHNIQUE: Multidetector CT imaging of the chest was performed during intravenous contrast administration.  CONTRAST:  77mL ISOVUE-300 IOPAMIDOL (ISOVUE-300) INJECTION 61%  COMPARISON:  01/04/2017 chest radiograph.  FINDINGS: Cardiovascular: Top-normal heart size. No significant pericardial fluid/thickening. Left anterior descending coronary atherosclerosis. Atherosclerotic and tortuous thoracic aorta with aneurysmal 4.7 cm maximum diameter ascending thoracic aorta. Normal caliber pulmonary arteries. No central pulmonary emboli.  Mediastinum/Nodes: Heterogeneous and atrophic appearing thyroid gland without discrete thyroid  nodules. Unremarkable esophagus. No pathologically enlarged axillary, mediastinal or hilar lymph nodes.  Lungs/Pleura: No pneumothorax. No pleural effusion. There are four scattered solid pulmonary nodules, largest 5 mm in the right middle lobe associated with the minor fissure (series 3/image 79) and 5 mm in the lingula (series 3/ image 100). No acute consolidative airspace disease, lung masses or additional significant pulmonary nodules. Mild centrilobular and paraseptal emphysema.  Upper abdomen: Unremarkable.  Musculoskeletal: No aggressive appearing focal osseous lesions. Moderate thoracic spondylosis.  IMPRESSION: 1. Retrocardiac density on the lateral chest radiograph view correlates with  focal tortuosity of the descending thoracic aorta. No lung masses. No consolidative airspace disease. 2. Four scattered solid pulmonary nodules, largest 5 mm. No follow-up needed if patient is low-risk (and has no known or suspected primary neoplasm). Non-contrast chest CT can be considered in 12 months if patient is high-risk. This recommendation follows the consensus statement: Guidelines for Management of Incidental Pulmonary Nodules Detected on CT Images: From the Fleischner Society 2017; Radiology 2017; 284:228-243. 3. Aortic atherosclerosis. Ascending thoracic aortic aneurysm, maximum diameter 4.7 cm. Ascending thoracic aortic aneurysm. Recommend semi-annual imaging followup by CTA or MRA and referral to cardiothoracic surgery if not already obtained. This recommendation follows 2010 ACCF/AHA/AATS/ACR/ASA/SCA/SCAI/SIR/STS/SVM Guidelines for the Diagnosis and Management of Patients With Thoracic Aortic Disease. Circulation. 2010; 121: T062-I948. 4. Mild emphysema.      Recent Lab Findings: Lab Results  Component Value Date   WBC 6.3 07/16/2020   HGB 16.1 07/16/2020   HCT 47.4 07/16/2020   PLT 167 07/16/2020   GLUCOSE 77 07/16/2020   CHOL 141 02/05/2016   TRIG 300 (H) 02/05/2016   HDL 26 (L) 02/05/2016   LDLCALC 55 02/05/2016   ALT 15 07/16/2020   AST 20 07/16/2020   NA 139 07/16/2020   K 4.2 07/16/2020   CL 107 07/16/2020   CREATININE 1.20 07/16/2020   BUN 12 07/16/2020   CO2 25 07/16/2020   TSH 5.938 (H) 02/06/2016   INR 1.04 11/16/2016   HGBA1C 5.4 02/05/2016   ECHO:01/2016 LV EF: 50% -   55%  ------------------------------------------------------------------- Indications:      CVA 436.  ------------------------------------------------------------------- Study Conclusions  - Left ventricle: The cavity size was normal. Wall thickness was   normal. Systolic function was normal. The estimated ejection   fraction was in the range of 50% to 55%. Wall  motion was normal;   there were no regional wall motion abnormalities. Doppler   parameters are consistent with abnormal left ventricular   relaxation (grade 1 diastolic dysfunction). - Aortic root: The aortic root was mildly dilated. - Mitral valve: Calcified annulus.  Impressions:  - Technically difficult; LV function low normal to mildly reduced   (EF 50); saline microcavitation study with late bubbles felt to   be nondiagnostic; if clinically indicated, TEE would have greater   sensitivity for source of embolus.  Transthoracic echocardiography.  M-mode, complete 2D, spectral Doppler, and color Doppler.  Birthdate:  Patient birthdate: [birth year].  Age:  Patient is 69 yr old.  Sex:  Gender: male. BMI: 35.8 kg/m^2.  Blood pressure:     123/94  Patient status: Inpatient.  Study date:  Study date: 02/07/2016. Study time: 10:04 AM.  Location:  Bedside.  -------------------------------------------------------------------  ------------------------------------------------------------------- Left ventricle:  The cavity size was normal. Wall thickness was normal. Systolic function was normal. The estimated ejection fraction was in the range of 50% to 55%. Wall motion was normal; there were no regional wall motion abnormalities. Doppler parameters are consistent  with abnormal left ventricular relaxation (grade 1 diastolic dysfunction).  ------------------------------------------------------------------- Aortic valve:   Trileaflet; mildly calcified leaflets. Mobility was not restricted.  Doppler:  Transvalvular velocity was within the normal range. There was no stenosis. There was no regurgitation.   ------------------------------------------------------------------- Aorta:  Aortic root: The aortic root was mildly dilated.  ------------------------------------------------------------------- Mitral valve:   Calcified annulus. Mobility was not restricted. Doppler:  Transvalvular  velocity was within the normal range. There was no evidence for stenosis. There was no regurgitation.  ------------------------------------------------------------------- Left atrium:  The atrium was normal in size.  ------------------------------------------------------------------- Right ventricle:  The cavity size was normal. Systolic function was normal.  ------------------------------------------------------------------- Pulmonic valve:    Doppler:  Transvalvular velocity was within the normal range. There was no evidence for stenosis.  ------------------------------------------------------------------- Tricuspid valve:   Structurally normal valve.    Doppler: Transvalvular velocity was within the normal range. There was trivial regurgitation.  ------------------------------------------------------------------- Pulmonary artery:   Systolic pressure was within the normal range.   ------------------------------------------------------------------- Right atrium:  The atrium was normal in size.  ------------------------------------------------------------------- Pericardium:  There was no pericardial effusion.  ------------------------------------------------------------------- Systemic veins: Inferior vena cava: The vessel was normal in size.  ------------------------------------------------------------------- Measurements   Left ventricle                         Value        Reference  LV ID, ED, PLAX chordal        (H)     52.3  mm     43 - 52  LV ID, ES, PLAX chordal        (H)     43.2  mm     23 - 38  LV fx shortening, PLAX chordal (L)     17    %      >=29  LV PW thickness, ED                    10.2  mm     ---------  IVS/LV PW ratio, ED                    1.04         <=1.3  LV ejection fraction, 1-p A4C          47    %      ---------  LV e&', lateral                         12.4  cm/s   ---------  LV E/e&', lateral                       4.73          ---------  LV e&', medial                          6.85  cm/s   ---------  LV E/e&', medial                        8.57         ---------  LV e&', average                         9.63  cm/s   ---------  LV E/e&', average  6.1          ---------    Ventricular septum                     Value        Reference  IVS thickness, ED                      10.6  mm     ---------    LVOT                                   Value        Reference  LVOT ID, S                             23    mm     ---------  LVOT area                              4.15  cm^2   ---------    Aorta                                  Value        Reference  Aortic root ID, ED                     38    mm     ---------    Left atrium                            Value        Reference  LA ID, A-P, ES                         38    mm     ---------  LA ID/bsa, A-P                         1.51  cm/m^2 <=2.2  LA volume, ES, 1-p A4C                 45.9  ml     ---------  LA volume/bsa, ES, 1-p A4C             18.3  ml/m^2 ---------    Mitral valve                           Value        Reference  Mitral E-wave peak velocity            58.7  cm/s   ---------  Mitral A-wave peak velocity            67.4  cm/s   ---------  Mitral deceleration time       (H)     313   ms     150 - 230  Mitral E/A ratio, peak                 0.9          ---------    Systemic veins  Value        Reference  Estimated CVP                          3     mm Hg  ---------    Right ventricle                        Value        Reference  TAPSE                                  15.3  mm     ---------  RV s&', lateral, S                      11.7  cm/s   ---------  Legend: (L)  and  (H)  mark values outside specified reference range.  ------------------------------------------------------------------- Prepared and Electronically Authenticated by  Kirk Ruths 2017-02-12T15:08:13    *[facility]*                         [address],  [address]                            [phone]  ------------------------------------------------------------------- Noninvasive Vascular Lab  Transcranial Duplex Study  Patient:    Dutch, Ing MR #:       [id number] Study Date: 02/07/2016 Gender:     M Age:        67 Height: Weight: BSA: Pt. Status: Room:       [id number]   ATTENDING    Eliezer Bottom [id number]  SONOGRAPHER  Landry Mellow, RVT, RDMS  Rachel Bo  Teressa Senter  Reports also to:  ------------------------------------------------------------------- History and indications:  Indications  Stroke patient with lower extremity DVT.  ------------------------------------------------------------------- Study information:  Study status:  Routine.  Procedure:  A vascular evaluation was performed. The right ICA, right ophthalmic, right middle cerebral, right anterior cerebral, right posterior cerebral, left ICA, left ophthalmic, left middle cerebral, left anterior cerebral, and left posterior cerebral arteries were studied. Image quality was excellent.    Transcranial duplex study.  Birthdate:  Patient birthdate: 1951-03-23.  Age:  Patient is 69 yr old.  Sex:  Gender: male.  Study date:  Study date: 02/07/2016. Study time: 02:36 PM. Location:  Vascular laboratory.  Patient status:  Inpatient.   ------------------------------------------------------------------- Summary:  - Transcranial Doppler Bubble study.   Performed by Dr. Erlinda Hong.     Verbal consent taken and risks/ benefits explained.     Left forearm IV was used for procedure.   Right Middle Cerebral artery was insonated.     HITS heard at rest: Spencer degree I   HITS heard during valsalva: Spencer degree IV -V     PFO size: small size at rest, large  size during valsalva. - Postive Transcranial Doppler study indicative of a large right to  left intracardiac shunt with valsalava manouvre.  Prepared and Electronically Authenticated by  Antony Contras MD    Aortic Size Index=     4.7    /Body surface area is 2.37 meters squared. 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  CTA shows a stable dilated  ascending aortic. Secondary changes noted-  Small nodule in the left chest (image 81, series 7) this measures 8 mm previously approximately 6 mm and Increasing subpleural reticulation in both lungs. [place] has  had recent COVID infection   #2 stroke without residual 01/2016  #3 PFO- by transcranial doppler not conformed on TTE)  #4 history of left leg DVT #5 bilateral knee replacements #6 History of Stage IIIB  (pT3,pN1b,cM0)    We will plan to see the patient back in 8 months with a follow-up CTA chest  .   We again reviewed the need to have good blood pressure control, avoid strenuous lifting and avoid quinolones.   Grace Isaac MD      [address] [place] [address] Office [phone]   Beeper [phone]  08/27/2020 2:35 PM

## 2020-08-27 ENCOUNTER — Ambulatory Visit: Payer: Medicare HMO | Admitting: Cardiothoracic Surgery

## 2020-08-27 ENCOUNTER — Ambulatory Visit
Admission: RE | Admit: 2020-08-27 | Discharge: 2020-08-27 | Disposition: A | Discharge status: Home / Self Care | Payer: Medicare HMO | Source: Ambulatory Visit | Attending: Cardiothoracic Surgery | Admitting: Cardiothoracic Surgery

## 2020-08-27 ENCOUNTER — Other Ambulatory Visit: Payer: Self-pay

## 2020-08-27 VITALS — BP 120/80 | HR 64 | Temp 97.6°F | Resp 20 | Ht 72.0 in | Wt 243.0 lb

## 2020-08-27 DIAGNOSIS — I712 Thoracic aortic aneurysm, without rupture, unspecified: Secondary | ICD-10-CM

## 2020-08-27 DIAGNOSIS — R911 Solitary pulmonary nodule: Secondary | ICD-10-CM

## 2020-08-27 DIAGNOSIS — M19011 Primary osteoarthritis, right shoulder: Secondary | ICD-10-CM | POA: Diagnosis not present

## 2020-08-27 DIAGNOSIS — I700 Atherosclerosis of aorta: Secondary | ICD-10-CM | POA: Diagnosis not present

## 2020-08-27 DIAGNOSIS — I2510 Atherosclerotic heart disease of native coronary artery without angina pectoris: Secondary | ICD-10-CM | POA: Diagnosis not present

## 2020-08-27 MED ORDER — IOPAMIDOL (ISOVUE-300) INJECTION 61%
75.0000 mL | Freq: Once | INTRAVENOUS | Status: AC | PRN
  Administered 2020-08-27: 75 mL via INTRAVENOUS

## 2020-10-08 DIAGNOSIS — Z125 Encounter for screening for malignant neoplasm of prostate: Secondary | ICD-10-CM | POA: Diagnosis not present

## 2020-10-08 DIAGNOSIS — E032 Hypothyroidism due to medicaments and other exogenous substances: Secondary | ICD-10-CM | POA: Diagnosis not present

## 2020-10-08 DIAGNOSIS — I10 Essential (primary) hypertension: Secondary | ICD-10-CM | POA: Diagnosis not present

## 2020-10-13 DIAGNOSIS — E785 Hyperlipidemia, unspecified: Secondary | ICD-10-CM | POA: Diagnosis not present

## 2020-10-13 DIAGNOSIS — N521 Erectile dysfunction due to diseases classified elsewhere: Secondary | ICD-10-CM | POA: Diagnosis not present

## 2020-10-13 DIAGNOSIS — I712 Thoracic aortic aneurysm, without rupture: Secondary | ICD-10-CM | POA: Diagnosis not present

## 2020-10-13 DIAGNOSIS — Z23 Encounter for immunization: Secondary | ICD-10-CM | POA: Diagnosis not present

## 2020-10-13 DIAGNOSIS — Z0001 Encounter for general adult medical examination with abnormal findings: Secondary | ICD-10-CM | POA: Diagnosis not present

## 2020-10-13 DIAGNOSIS — E032 Hypothyroidism due to medicaments and other exogenous substances: Secondary | ICD-10-CM | POA: Diagnosis not present

## 2020-10-13 DIAGNOSIS — I6529 Occlusion and stenosis of unspecified carotid artery: Secondary | ICD-10-CM | POA: Diagnosis not present

## 2020-10-13 DIAGNOSIS — I10 Essential (primary) hypertension: Secondary | ICD-10-CM | POA: Diagnosis not present

## 2020-10-13 DIAGNOSIS — Z85038 Personal history of other malignant neoplasm of large intestine: Secondary | ICD-10-CM | POA: Diagnosis not present

## 2020-10-13 DIAGNOSIS — M8580 Other specified disorders of bone density and structure, unspecified site: Secondary | ICD-10-CM | POA: Diagnosis not present

## 2020-11-16 NOTE — Progress Notes (Signed)
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 Watt Climes -An infiltrative non-obstructing small mass was found in the mid sigmoid colon. Mass was partially circumferential (involving one third of the lumen circumference). The mass measured 2 cm in length, diameter was 2 mm. No bleeding was present.   -external and internal hemorrhoids -diverticulosis in the sigmoid colon, in the descending colon, in the transverse colon, and at the hepatic flexure -one medium polyp in the mid transverse colon  -ten small polyps in the rectum, in the sigmoid colon, in the descending colon, and in the transverse colon -likely malignant tumor in the mid sigmoid colon -exam was otherwise normal    02/26/2018 Initial Biopsy   From colonoscopy: 1. LG intestine- transverse colon, descending, polyp -Tubular adenomas (8). Sessile serrated adenoma/polyp 2. LG intestine - sigmoid colon bx -INVASIVE WELL DIFFERENTIATED ADENOCARCINOMA 3. LG intestine -sigmoid colon, rectum, polyp -Tubular adenoma with high grade dysplasia. Hypoplastic polyps (3).   02/26/2018 Initial Diagnosis   Colon cancer ([place])   02/28/2018 Imaging   CT AP IMPRESSION: -No evidence of metastatic disease or other acute findings. -Colonic diverticulosis, without radiographic evidence of diverticulitis. -Mild hepatic steatosis. -Mildly enlarged  prostate.    03/07/2018 Tumor Marker   CEA 1.3 (pre-op)   03/08/2018 Surgery   PROCEDURE:  Procedure(s): LAPAROSCOPIC ASSISTED SIGMOID COLECTOMY ERAS PATHWAY  MOBILIZATION OF SPLENIC FLEXURE RIGID SIGMOIDOSCOPY   03/08/2018 Cancer Staging   Staging form: Colon and Rectum, AJCC 8th Edition - Pathologic stage from 03/08/2018: Stage IIIB (pT3, pN1b, cM0) - Signed by Alla Feeling, NP on 04/05/2018   03/08/2018 Pathology Results   Diagnosis 1. Colon, segmental resection for tumor, sigmoid, stitch marks distal - INVASIVE ADENOCARCINOMA, MODERATELY DIFFERENTIATED, SPANNING 2.4 CM. - TUMOR FOCALLY INVADES THROUGH MUSCULARIS PROPRIA. - RESECTION MARGIN IS NEGATIVE. - METASTATIC CARCINOMA IN TWO OF TWENTY LYMPH NODES (2/20). - DIVERTICULOSIS. - HYPERPLASTIC POLYP (X2). - SEE ONCOLOGY TABLE. -MMR normal    04/09/2018 Imaging   CT Chest WO Contrast 04/09/18 IMPRESSION: 1. Stable exam. Scattered bilateral tiny pulmonary nodules without interval change. No new or progressive pulmonary nodule or mass. Attention on follow-up may be warranted. 2. Ascending thoracic aorta measures up to 4.9 cm maximum diameter, increased from 4.6 cm on prior study. Ascending thoracic aortic aneurysm. Recommend semi-annual imaging followup by CTA or MRA and referral to cardiothoracic surgery if not already obtained. This recommendation follows 2010 ACCF/AHA/AATS/ACR/ASA/SCA/SCAI/SIR/STS/SVM Guidelines for the Diagnosis and Management of Patients With Thoracic Aortic Disease. Circulation. 2010; 121: U765-Y650   04/13/2018 - 06/29/2018 Chemotherapy   Adjuvant CAPOX every 3 weeks. Oxaliplatin 152m/m2, 2000 mg Xeloda BID on days 1-14, every 21 days starting 04/13/18. Dose reduced for first cycle. Last oxaliplatin treatment 06/15/18 at reduced dose due to neuropathy and fatigue, complete Xeloda on 06/29/18   06/04/2018 Genetic Testing   06/04/18 Testing did not reveal a pathogenic mutation in any of the genes analyzed.  A copy of the genetic test report will be scanned into Epic under the Media tab.  The genes analyzed were the 83 genes on [facility]'s Multi-Cancer panel (ALK, APC, ATM, AXIN2, BAP1, BARD1, BLM, BMPR1A, BRCA1, BRCA2, BRIP1, CASR, CDC73, CDH1, CDK4, CDKN1B, CDKN1C, CDKN2A, CEBPA, CHEK2, CTNNA1, DICER1, DIS3L2, EGFR, EPCAM, FH, FLCN, GATA2, GPC3, GREM1, HOXB13, HRAS, KIT, MAX, MEN1, MET, MITF, MLH1, MSH2, MSH3, MSH6, MUTYH, NBN, NF1, NF2, NTHL1, PALB2, PDGFRA, PHOX2B, PMS2, POLD1, POLE, POT1, PRKAR1A, PTCH1, PTEN, RAD50, RAD51C, RAD51D, RB1, RECQL4, RET, RUNX1, SDHA, SDHAF2, SDHB, SDHC, SDHD, SMAD4, SMARCA4, SMARCB1, SMARCE1, STK11, SUFU, TERC, TERT, TMEM127, TP53, TSC1, TSC2, VHL, WRN, WT1).    01/23/2019 Imaging   CT AP W Contrast 01/23/19  IMPRESSION: Status post partial left hemicolectomy. No evidence of recurrent or metastatic disease.  Additional stable ancillary findings as above. Dedicated CTA chest reported separately.   CT Angio Chest 01/23/19  IMPRESSION: 1. Stable aneurysmal disease of the ascending thoracic aorta measuring 4.7 cm in greatest diameter. No dissection. 2. Stable mild cardiac enlargement. 3. Stable small bilateral pulmonary nodules demonstrating stability since January, 2018. Aortic aneurysm NOS (ICD10-I71.9).   02/20/2020 Imaging   CT Angio Chest  IMPRESSION: 1. Ascending thoracic aorta at 4.7 cm, stable. 2. Stable mild cardiac enlargement. 3. Increasing subpleural reticulation in both lungs since the previous study may represent developing fibrosis. But is nonspecific. Correlate with any recent infection and consider three-month follow-up with high-resolution chest CT. This could also be utilized to evaluate the small peripheral nodule seen in the left chest and the slightly enlarged nodule seen in the left upper lobe as described. 4. Nodular hepatic contours similar to prior exam.   Aortic Atherosclerosis (ICD10-I70.0).   03/03/2020 Imaging   CT  AP IMPRESSION: 1. No evidence of recurrent or metastatic carcinoma within the abdomen or pelvis. 2. Stable moderately enlarged prostate.  No acute findings.       CURRENT THERAPY: Surveillance  INTERVAL HISTORY: Christian Weeks returns for follow-up as scheduled.  He was last seen by Dr. Burr Medico on 07/16/2020.  He was seen by Dr. Servando Snare on 9/2 for aneurysm follow-up which was stable and showed a left upper lobe lung nodule which had decreased in size from 02/20/2020. He feels well in general, denies changes in his overall health. Energy and appetite are adequate. Denies unintentional weight loss, abdominal pain or bloating, change in bowel habits, or rectal bleeding. He has mild residual intermittent tingling in his fingertips, occasionally drops lighter objects but no functional difficulties. Denies any recent fever, chills, cough, chest pain, dyspnea, signs of thrombosis, nausea or vomiting, or other new concerns.   MEDICAL HISTORY:  Past Medical History:  Diagnosis Date  . Arthritis   . Blood clot in vein 01/2016  . Colon cancer ([place])   . Colon polyps   . Degenerative joint disease   . Genetic testing 06/11/18   Multi-Cancer panel (83 genes) @ [facility] - No pathogenic mutations detected  . History of kidney stones age 67's  . Hypertension   . Hypothyroidism   . Mass of colon    sigmoid  . Stroke ([place]) 01/2016   speech difficulty for a few hours after knee replacement left  . Thoracic ascending aortic aneurysm ([facility])    4.6-4.7 cm followed by Dr Servando Snare- LOV- 08/2017-epic   . Wears dentures   . Wears glasses     SURGICAL HISTORY: Past Surgical History:  Procedure Laterality Date  . colonscopy  02/26/2018  . KNEE ARTHROPLASTY Left 01/15/2016  Procedure: COMPUTER ASSISTED TOTAL KNEE ARTHROPLASTY;  Surgeon: [name], MD;  Location: [place];  Service: Orthopedics;  Laterality: Left;  . LAPAROSCOPIC SIGMOID COLECTOMY N/A 03/08/2018   Procedure: LAPAROSCOPIC ASSISTED SIGMOID COLECTOMY  ERAS PATHWAY;  Surgeon: Jovita Kussmaul, MD;  Location: [facility];  Service: General;  Laterality: N/A;  . MULTIPLE TOOTH EXTRACTIONS    . TOTAL KNEE ARTHROPLASTY Right 01/13/2017   Procedure: RIGHT TOTAL KNEE ARTHROPLASTY;  Surgeon: [name], MD;  Location: [place];  Service: Orthopedics;  Laterality: Right;    I have reviewed the social history and family history with the patient and they are unchanged from previous note.  ALLERGIES:  is allergic to quinolones and poison oak extract.  MEDICATIONS:  Current Outpatient Medications  Medication Sig Dispense Refill  . acetaminophen (TYLENOL) 500 MG tablet Take 1,000 mg by mouth every 6 (six) hours as needed for moderate pain or headache.     . Cholecalciferol (VITAMIN D3) 3000 units TABS Take 3,000 Units by mouth daily.    Marland Kitchen levothyroxine (SYNTHROID) 112 MCG tablet Take 112 mcg by mouth daily before breakfast.     No current facility-administered medications for this visit.    PHYSICAL EXAMINATION: ECOG PERFORMANCE STATUS: 0 - Asymptomatic  Vitals:   11/17/20 0905  BP: 124/85  Pulse: 66  Resp: 17  Temp: 97.9 F (36.6 C)  SpO2: 98%   Filed Weights   11/17/20 0905  Weight: 242 lb (109.8 kg)    GENERAL:alert, no distress and comfortable SKIN: No rash EYES: sclera clear LYMPH:  no palpable cervical, supraclavicular, or inguinal lymphadenopathy  LUNGS: clear with normal breathing effort HEART: regular rate & rhythm, no lower extremity edema ABDOMEN:abdomen soft, non-tender and normal bowel sounds NEURO: alert & oriented x 3 with fluent speech  LABORATORY DATA:  I have reviewed the data as listed CBC Latest Ref Rng & Units 11/17/2020 07/16/2020 01/09/2020  WBC 4.0 - 10.5 K/uL 6.1 6.3 6.9  Hemoglobin 13.0 - 17.0 g/dL 15.7 16.1 15.0  Hematocrit 39 - 52 % 47.2 47.4 44.6  Platelets 150 - 400 K/uL 184 167 200     CMP Latest Ref Rng & Units 11/17/2020 07/16/2020 01/09/2020  Glucose 70 - 99 mg/dL 104(H) 77 96  BUN 8 - 23 mg/dL '12  12 13  ' Creatinine 0.61 - 1.24 mg/dL 1.19 1.20 1.06  Sodium 135 - 145 mmol/L 141 139 138  Potassium 3.5 - 5.1 mmol/L 3.9 4.2 4.5  Chloride 98 - 111 mmol/L 110 107 108  CO2 22 - 32 mmol/L 21(L) 25 23  Calcium 8.9 - 10.3 mg/dL 8.9 9.4 8.4(L)  Total Protein 6.5 - 8.1 g/dL 7.1 7.1 7.1  Total Bilirubin 0.3 - 1.2 mg/dL 0.7 0.6 0.8  Alkaline Phos 38 - 126 U/L 108 89 94  AST 15 - 41 U/L '18 20 15  ' ALT 0 - 44 U/L '13 15 10      ' RADIOGRAPHIC STUDIES: I have personally reviewed the radiological images as listed and agreed with the findings in the report. No results found.   ASSESSMENT & PLAN: Christian Weeks is a 69 y.o. male with    1. Sigmoid colon adenocarcinoma, well differentiated, pT3N1b,M0, stage IIIB, MMR-normal -Diagnosed in 02/2018. S/p sigmoidhemicolectomy and adjuvantchemo withCAPOX.  -Hehas recovered very well from chemotherapyexcept mildresidualintermittent tinglingfingertips, no functional deficits -His 06/2019 colonoscopy showed hemorrhoids, sigmoid diverticulosis andadenomaand hyperplasticpolyps.  -Surveillance CT AP on 03/03/2020 showed no recurrent or metastatic disease. -Continue surveillance, next CT in 02/2021, colonoscopy  06/2021 with Dr. Watt Climes  2. GeneticTesting was negative  3. HTN, thoracic aneurysm -4.8 cm ascending thoracic aortic aneurysm followed by Dr. Servando Snare  -CT chest on 08/27/2020 also showed left upper lobe nodule that was reduced in size from 01/2020 but not present on prior CT's -Next CTA Spring/2022 -BP stable  4. 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  Completed anticoagulation withEliquisfrom 01/2016 - 08/2017.  - Followed by PCP and neuro.  -No signs of recurrent thrombosis  5. COVID19 positive in 10/2019 -He has received both his COVID19 vaccines.    Disposition: Mr. Tozzi is clinically doing well. Exam is benign. CBC and CMP are unremarkable. There is no clinical concern for  colon cancer recurrence. Continue surveillance.  We will follow-up on the pending CEA from today.  We discussed he is almost 3 years from his initial diagnosis, the recurrence risk reduces after 2-3 years. We will repeat surveillance imaging in 02/2021, and colonoscopy in 06/2021 with Dr. Watt Climes.  We reviewed signs and symptoms of recurrence including unexplained fatigue, unintentional weight loss, abdominal pain or bloating, change in bowel habits, or rectal bleeding. We will see him back for routine surveillance in 4 months with CT scan.   Orders Placed This Encounter  Procedures  . CT Abdomen Pelvis W Contrast    Standing Status:   Future    Standing Expiration Date:   11/17/2021    Order Specific Question:   If indicated for the ordered procedure, I authorize the administration of contrast media per Radiology protocol    Answer:   Yes    Order Specific Question:   Preferred imaging location?    Answer:   [facility]    Order Specific Question:   Is Oral Contrast requested for this exam?    Answer:   Yes, Per Radiology protocol   All questions were answered. The patient knows to call the clinic with any problems, questions or concerns. No barriers to learning were detected.     Alla Feeling, NP 11/17/20

## 2020-11-17 ENCOUNTER — Encounter: Payer: Self-pay | Admitting: Nurse Practitioner

## 2020-11-17 ENCOUNTER — Other Ambulatory Visit: Payer: Self-pay

## 2020-11-17 ENCOUNTER — Inpatient Hospital Stay: Payer: Medicare HMO

## 2020-11-17 ENCOUNTER — Inpatient Hospital Stay: Payer: Medicare HMO | Attending: Nurse Practitioner | Admitting: Nurse Practitioner

## 2020-11-17 VITALS — BP 124/85 | HR 66 | Temp 97.9°F | Resp 17 | Ht 72.0 in | Wt 242.0 lb

## 2020-11-17 DIAGNOSIS — I712 Thoracic aortic aneurysm, without rupture: Secondary | ICD-10-CM | POA: Diagnosis not present

## 2020-11-17 DIAGNOSIS — Z8616 Personal history of COVID-19: Secondary | ICD-10-CM | POA: Diagnosis not present

## 2020-11-17 DIAGNOSIS — C772 Secondary and unspecified malignant neoplasm of intra-abdominal lymph nodes: Secondary | ICD-10-CM

## 2020-11-17 DIAGNOSIS — N400 Enlarged prostate without lower urinary tract symptoms: Secondary | ICD-10-CM | POA: Diagnosis not present

## 2020-11-17 DIAGNOSIS — Z96653 Presence of artificial knee joint, bilateral: Secondary | ICD-10-CM | POA: Insufficient documentation

## 2020-11-17 DIAGNOSIS — Z86718 Personal history of other venous thrombosis and embolism: Secondary | ICD-10-CM | POA: Insufficient documentation

## 2020-11-17 DIAGNOSIS — Z9049 Acquired absence of other specified parts of digestive tract: Secondary | ICD-10-CM | POA: Diagnosis not present

## 2020-11-17 DIAGNOSIS — C187 Malignant neoplasm of sigmoid colon: Secondary | ICD-10-CM

## 2020-11-17 DIAGNOSIS — Z85038 Personal history of other malignant neoplasm of large intestine: Secondary | ICD-10-CM | POA: Diagnosis not present

## 2020-11-17 DIAGNOSIS — I10 Essential (primary) hypertension: Secondary | ICD-10-CM | POA: Insufficient documentation

## 2020-11-17 DIAGNOSIS — E039 Hypothyroidism, unspecified: Secondary | ICD-10-CM | POA: Insufficient documentation

## 2020-11-17 DIAGNOSIS — Z87442 Personal history of urinary calculi: Secondary | ICD-10-CM | POA: Diagnosis not present

## 2020-11-17 DIAGNOSIS — Z8673 Personal history of transient ischemic attack (TIA), and cerebral infarction without residual deficits: Secondary | ICD-10-CM | POA: Diagnosis not present

## 2020-11-17 DIAGNOSIS — Z9221 Personal history of antineoplastic chemotherapy: Secondary | ICD-10-CM | POA: Insufficient documentation

## 2020-11-17 DIAGNOSIS — Z79899 Other long term (current) drug therapy: Secondary | ICD-10-CM | POA: Insufficient documentation

## 2020-11-17 LAB — CMP (CANCER CENTER ONLY)
ALT: 13 U/L (ref 0–44)
AST: 18 U/L (ref 15–41)
Albumin: 3.7 g/dL (ref 3.5–5.0)
Alkaline Phosphatase: 108 U/L (ref 38–126)
Anion gap: 10 (ref 5–15)
BUN: 12 mg/dL (ref 8–23)
CO2: 21 mmol/L — ABNORMAL LOW (ref 22–32)
Calcium: 8.9 mg/dL (ref 8.9–10.3)
Chloride: 110 mmol/L (ref 98–111)
Creatinine: 1.19 mg/dL (ref 0.61–1.24)
GFR, Estimated: >60 mL/min (ref >60)
Glucose, Bld: 104 mg/dL — ABNORMAL HIGH (ref 70–99)
Potassium: 3.9 mmol/L (ref 3.5–5.1)
Sodium: 141 mmol/L (ref 135–145)
Total Bilirubin: 0.7 mg/dL (ref 0.3–1.2)
Total Protein: 7.1 g/dL (ref 6.5–8.1)

## 2020-11-17 LAB — CBC WITH DIFFERENTIAL (CANCER CENTER ONLY)
Abs Immature Granulocytes: 0.01 10*3/uL (ref 0.00–0.07)
Basophils Absolute: 0 10*3/uL (ref 0.0–0.1)
Basophils Relative: 1 %
Eosinophils Absolute: 0.4 10*3/uL (ref 0.0–0.5)
Eosinophils Relative: 6 %
HCT: 47.2 % (ref 39.0–52.0)
Hemoglobin: 15.7 g/dL (ref 13.0–17.0)
Immature Granulocytes: 0 %
Lymphocytes Relative: 25 %
Lymphs Abs: 1.5 10*3/uL (ref 0.7–4.0)
MCH: 29.7 pg (ref 26.0–34.0)
MCHC: 33.3 g/dL (ref 30.0–36.0)
MCV: 89.2 fL (ref 80.0–100.0)
Monocytes Absolute: 0.7 10*3/uL (ref 0.1–1.0)
Monocytes Relative: 12 %
Neutro Abs: 3.5 10*3/uL (ref 1.7–7.7)
Neutrophils Relative %: 56 %
Platelet Count: 184 10*3/uL (ref 150–400)
RBC: 5.29 MIL/uL (ref 4.22–5.81)
RDW: 12.5 % (ref 11.5–15.5)
WBC Count: 6.1 10*3/uL (ref 4.0–10.5)
nRBC: 0 % (ref 0.0–0.2)

## 2020-11-17 LAB — CEA (IN HOUSE-CHCC): CEA (CHCC-In House): 1.14 ng/mL (ref 0.00–5.00)

## 2020-11-18 ENCOUNTER — Telehealth: Payer: Self-pay | Admitting: Hematology

## 2020-11-18 NOTE — Telephone Encounter (Signed)
Scheduled appts per 11/23 los. Pt confirmed appt date and time.

## 2020-12-15 DIAGNOSIS — E032 Hypothyroidism due to medicaments and other exogenous substances: Secondary | ICD-10-CM | POA: Diagnosis not present

## 2020-12-15 DIAGNOSIS — I6529 Occlusion and stenosis of unspecified carotid artery: Secondary | ICD-10-CM | POA: Diagnosis not present

## 2020-12-15 DIAGNOSIS — M810 Age-related osteoporosis without current pathological fracture: Secondary | ICD-10-CM | POA: Diagnosis not present

## 2020-12-15 DIAGNOSIS — I10 Essential (primary) hypertension: Secondary | ICD-10-CM | POA: Diagnosis not present

## 2020-12-17 DIAGNOSIS — M810 Age-related osteoporosis without current pathological fracture: Secondary | ICD-10-CM | POA: Diagnosis not present

## 2020-12-17 DIAGNOSIS — E032 Hypothyroidism due to medicaments and other exogenous substances: Secondary | ICD-10-CM | POA: Diagnosis not present

## 2020-12-17 DIAGNOSIS — I6529 Occlusion and stenosis of unspecified carotid artery: Secondary | ICD-10-CM | POA: Diagnosis not present

## 2021-02-04 ENCOUNTER — Telehealth: Payer: Self-pay

## 2021-02-05 ENCOUNTER — Other Ambulatory Visit: Payer: Self-pay | Admitting: Nurse Practitioner

## 2021-02-05 DIAGNOSIS — C772 Secondary and unspecified malignant neoplasm of intra-abdominal lymph nodes: Secondary | ICD-10-CM

## 2021-02-05 DIAGNOSIS — C187 Malignant neoplasm of sigmoid colon: Secondary | ICD-10-CM

## 2021-02-05 NOTE — Telephone Encounter (Signed)
Chart review.

## 2021-03-17 ENCOUNTER — Other Ambulatory Visit: Payer: Medicare HMO

## 2021-03-17 NOTE — Progress Notes (Signed)
[place]   [phone] Fax:(336) [phone]   Clinic Follow up Note   Patient Care Team: Deland Pretty, MD as PCP - General (Internal Medicine) Constance Haw, MD as PCP - Cardiology (Cardiology)  Date of Service:  03/19/2021  CHIEF COMPLAINT: Follow Up Sigmoid colon adenocarcinoma  SUMMARY OF ONCOLOGIC HISTORY: Oncology History Overview Note  Cancer Staging Colon cancer 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 Watt Climes -An infiltrative non-obstructing small mass was found in the mid sigmoid colon. Mass was partially circumferential (involving one third of the lumen circumference). The mass measured 2 cm in length, diameter was 2 mm. No bleeding was present.   -external and internal hemorrhoids -diverticulosis in the sigmoid colon, in the descending colon, in the transverse colon, and at the hepatic flexure -one medium polyp in the mid transverse colon  -ten small polyps in the rectum, in the sigmoid colon, in the descending colon, and in the transverse colon -likely malignant tumor in the mid sigmoid colon -exam was otherwise normal    02/26/2018 Initial Biopsy   From colonoscopy: 1. LG intestine- transverse colon, descending, polyp -Tubular adenomas (8). Sessile serrated adenoma/polyp 2. LG intestine - sigmoid colon bx -INVASIVE WELL DIFFERENTIATED ADENOCARCINOMA 3. LG intestine -sigmoid colon, rectum, polyp -Tubular adenoma with high grade dysplasia. Hypoplastic polyps (3).   02/26/2018 Initial Diagnosis   Colon cancer ([place])   02/28/2018 Imaging   CT AP IMPRESSION: -No evidence of metastatic disease or other acute findings. -Colonic diverticulosis, without radiographic evidence of diverticulitis. -Mild  hepatic steatosis. -Mildly enlarged prostate.    03/07/2018 Tumor Marker   CEA 1.3 (pre-op)   03/08/2018 Surgery   PROCEDURE:  Procedure(s): LAPAROSCOPIC ASSISTED SIGMOID COLECTOMY ERAS PATHWAY  MOBILIZATION OF SPLENIC FLEXURE RIGID SIGMOIDOSCOPY   03/08/2018 Cancer Staging   Staging form: Colon and Rectum, AJCC 8th Edition - Pathologic stage from 03/08/2018: Stage IIIB (pT3, pN1b, cM0) - Signed by Alla Feeling, NP on 04/05/2018   03/08/2018 Pathology Results   Diagnosis 1. Colon, segmental resection for tumor, sigmoid, stitch marks distal - INVASIVE ADENOCARCINOMA, MODERATELY DIFFERENTIATED, SPANNING 2.4 CM. - TUMOR FOCALLY INVADES THROUGH MUSCULARIS PROPRIA. - RESECTION MARGIN IS NEGATIVE. - METASTATIC CARCINOMA IN TWO OF TWENTY LYMPH NODES (2/20). - DIVERTICULOSIS. - HYPERPLASTIC POLYP (X2). - SEE ONCOLOGY TABLE. -MMR normal    04/09/2018 Imaging   CT Chest WO Contrast 04/09/18 IMPRESSION: 1. Stable exam. Scattered bilateral tiny pulmonary nodules without interval change. No new or progressive pulmonary nodule or mass. Attention on follow-up may be warranted. 2. Ascending thoracic aorta measures up to 4.9 cm maximum diameter, increased from 4.6 cm on prior study. Ascending thoracic aortic aneurysm. Recommend semi-annual imaging followup by CTA or MRA and referral to cardiothoracic surgery if not already obtained. This recommendation follows 2010 ACCF/AHA/AATS/ACR/ASA/SCA/SCAI/SIR/STS/SVM Guidelines for the Diagnosis and Management of Patients With Thoracic Aortic Disease. Circulation. 2010; 121: O973-Z329   04/13/2018 - 06/29/2018 Chemotherapy   Adjuvant CAPOX every 3 weeks. Oxaliplatin 116m/m2, 2000 mg Xeloda BID on days 1-14, every 21 days starting 04/13/18. Dose reduced for first cycle. Last oxaliplatin treatment 06/15/18 at reduced dose due to neuropathy and fatigue, complete Xeloda on 06/29/18   06/04/2018 Genetic Testing   06/04/18 Testing did not reveal a pathogenic  mutation in any of the genes analyzed. A copy of the genetic test report will be scanned into Epic under the Media tab.  The genes analyzed were the 83 genes on [facility]'s Multi-Cancer panel (ALK, APC, ATM, AXIN2, BAP1, BARD1, BLM, BMPR1A, BRCA1, BRCA2, BRIP1, CASR, CDC73, CDH1, CDK4, CDKN1B, CDKN1C, CDKN2A, CEBPA, CHEK2, CTNNA1, DICER1, DIS3L2, EGFR, EPCAM, FH, FLCN, GATA2, GPC3, GREM1, HOXB13, HRAS, KIT, MAX, MEN1, MET, MITF, MLH1, MSH2, MSH3, MSH6, MUTYH, NBN, NF1, NF2, NTHL1, PALB2, PDGFRA, PHOX2B, PMS2, POLD1, POLE, POT1, PRKAR1A, PTCH1, PTEN, RAD50, RAD51C, RAD51D, RB1, RECQL4, RET, RUNX1, SDHA, SDHAF2, SDHB, SDHC, SDHD, SMAD4, SMARCA4, SMARCB1, SMARCE1, STK11, SUFU, TERC, TERT, TMEM127, TP53, TSC1, TSC2, VHL, WRN, WT1).    01/23/2019 Imaging   CT AP W Contrast 01/23/19  IMPRESSION: Status post partial left hemicolectomy. No evidence of recurrent or metastatic disease.  Additional stable ancillary findings as above. Dedicated CTA chest reported separately.   CT Angio Chest 01/23/19  IMPRESSION: 1. Stable aneurysmal disease of the ascending thoracic aorta measuring 4.7 cm in greatest diameter. No dissection. 2. Stable mild cardiac enlargement. 3. Stable small bilateral pulmonary nodules demonstrating stability since January, 2018. Aortic aneurysm NOS (ICD10-I71.9).   02/20/2020 Imaging   CT Angio Chest  IMPRESSION: 1. Ascending thoracic aorta at 4.7 cm, stable. 2. Stable mild cardiac enlargement. 3. Increasing subpleural reticulation in both lungs since the previous study may represent developing fibrosis. But is nonspecific. Correlate with any recent infection and consider three-month follow-up with high-resolution chest CT. This could also be utilized to evaluate the small peripheral nodule seen in the left chest and the slightly enlarged nodule seen in the left upper lobe as described. 4. Nodular hepatic contours similar to prior exam.   Aortic Atherosclerosis  (ICD10-I70.0).   03/03/2020 Imaging   CT AP IMPRESSION: 1. No evidence of recurrent or metastatic carcinoma within the abdomen or pelvis. 2. Stable moderately enlarged prostate.  No acute findings.     03/18/2021 Imaging   CT CAP  IMPRESSION: 1. LEFT femoral fracture obliquely through the greater trochanter. Sclerosis of the fracture fragment compatible with chronicity, this has however occurred since previous imaging. Pathologic fracture is considered as well given pattern of the fracture and adjacent chondroid lesion though this does not appear to passed through the chondroid lesion. Correlate with any history of trauma and consider orthopedic consultation given greater trochanteric involvement and presence of underlying lesion. 2. No signs of metastatic disease in the chest, abdomen or pelvis. 3. Ascending thoracic aortic aneurysm as before. Recommend semi-annual imaging followup by CTA or MRA and referral to cardiothoracic surgery if not already obtained. This recommendation follows 2010 ACCF/AHA/AATS/ACR/ASA/SCA/SCAI/SIR/STS/SVM Guidelines for the Diagnosis and Management of Patients With Thoracic Aortic Disease. Circulation. 2010; 121: J194-R74. Aortic aneurysm NOS (ICD10-I71.9) 4. Prostate with heterogeneity and enlargement with similar appearance to prior imaging, nonspecific on CT. 5. Aortic atherosclerosis.   These results will be called to the ordering clinician or representative by the Radiologist Assistant, and communication documented in the PACS or [employer].   Aortic Atherosclerosis (ICD10-I70.0).      CURRENT THERAPY:  Surveillance  INTERVAL HISTORY:  ATA PECHA is here for a follow up of colon cancer. He was last seen by me 8 months ago and seen by NP Lacie 4 months ago in interim. He presents to the clinic alone. He notes he had a left hip fracture in Fall 2021. He had a fall when using leaf blower. He notes he used crutches and cane, but did  not go to ED for this. He denies current pain now and ambulating independently. He denies any new or concerning head, chest, breathing, or abdominal issues. 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 This is manageable.     REVIEW OF SYSTEMS:   Constitutional: Denies fevers, chills or abnormal weight loss Eyes: Denies blurriness of vision Ears, nose, mouth, throat, and face: Denies mucositis or sore throat Respiratory: Denies cough, dyspnea or wheezes Cardiovascular: Denies palpitation, chest discomfort or lower extremity swelling Gastrointestinal:  Denies nausea, heartburn or change in bowel habits Skin: Denies abnormal skin rashes Lymphatics: Denies new lymphadenopathy or easy bruising Neurological:Denies numbness, tingling or new weaknesses Behavioral/Psych: Mood is stable, no new changes  All other systems were reviewed with the patient and are negative.  MEDICAL HISTORY:  Past Medical History:  Diagnosis Date  . Arthritis   . Blood clot in vein 01/2016  . Colon cancer ([place])   . Colon polyps   . Degenerative joint disease   . Genetic testing 06/11/18   Multi-Cancer panel (83 genes) @ [facility] - No pathogenic mutations detected  . History of kidney stones age 35's  . Hypertension   . Hypothyroidism   . Mass of colon    sigmoid  . Stroke ([place]) 01/2016   speech difficulty for a few hours after knee replacement left  . Thoracic ascending aortic aneurysm ([facility])    4.6-4.7 cm followed by Dr Servando Snare- LOV- 08/2017-epic   . Wears dentures   . Wears glasses     SURGICAL HISTORY: Past Surgical History:  Procedure Laterality Date  . colonscopy  02/26/2018  . KNEE ARTHROPLASTY Left 01/15/2016   Procedure: COMPUTER ASSISTED TOTAL KNEE ARTHROPLASTY;  Surgeon: [name], MD;  Location: [facility];  Service: Orthopedics;  Laterality: Left;  . LAPAROSCOPIC SIGMOID COLECTOMY N/A 03/08/2018   Procedure: LAPAROSCOPIC ASSISTED SIGMOID COLECTOMY ERAS  PATHWAY;  Surgeon: Jovita Kussmaul, MD;  Location: [facility];  Service: General;  Laterality: N/A;  . MULTIPLE TOOTH EXTRACTIONS    . TOTAL KNEE ARTHROPLASTY Right 01/13/2017   Procedure: RIGHT TOTAL KNEE ARTHROPLASTY;  Surgeon: [name], MD;  Location: [place];  Service: Orthopedics;  Laterality: Right;    I have reviewed the social history and family history with the patient and they are unchanged from previous note.  ALLERGIES:  is allergic to quinolones and poison oak extract.  MEDICATIONS:  Current Outpatient Medications  Medication Sig Dispense Refill  . Cholecalciferol (VITAMIN D3) 3000 units TABS Take 3,000 Units by mouth daily.    Marland Kitchen levothyroxine (SYNTHROID) 125 MCG tablet Take 125 mcg by mouth daily before breakfast.    . acetaminophen (TYLENOL) 500 MG tablet Take 1,000 mg by mouth every 6 (six) hours as needed for moderate pain or headache.      No current facility-administered medications for this visit.    PHYSICAL EXAMINATION: ECOG PERFORMANCE STATUS: 0 - Asymptomatic  Vitals:   03/19/21 1301  BP: 115/84  Pulse: 70  Resp: 15  Temp: (!) 97.4 F (36.3 C)  SpO2: 96%   Filed Weights   03/19/21 1301  Weight: 247 lb (112 kg)    GENERAL:alert, no distress and comfortable SKIN: skin color, texture, turgor are normal, no rashes or significant lesions EYES: normal, Conjunctiva are pink and non-injected, sclera clear  NECK: supple, thyroid normal size, non-tender, without nodularity LYMPH:  no palpable lymphadenopathy in the cervical, axillary  LUNGS: clear to auscultation and percussion with normal breathing effort  HEART: regular rate & rhythm and no murmurs and no lower extremity edema ABDOMEN:abdomen soft, non-tender and normal bowel sounds Musculoskeletal:no cyanosis of digits and no clubbing  NEURO: alert & oriented x 3 with fluent speech, no focal motor/sensory deficits  LABORATORY DATA:  I have reviewed the data as listed CBC Latest Ref Rng & Units 03/18/2021  11/17/2020 07/16/2020  WBC 4.0 - 10.5 K/uL 6.4 6.1 6.3  Hemoglobin 13.0 - 17.0 g/dL 15.4 15.7 16.1  Hematocrit 39.0 - 52.0 % 45.9 47.2 47.4  Platelets 150 - 400 K/uL 170 184 167     CMP Latest Ref Rng & Units 03/18/2021 11/17/2020 07/16/2020  Glucose 70 - 99 mg/dL 96 104(H) 77  BUN 8 - 23 mg/dL _0 Creatinine 0.61 - 1.24 mg/dL 1.11 1.19 1.20  Sodium 135 - 145 mmol/L 140 141 139  Potassium 3.5 - 5.1 mmol/L 4.7 3.9 4.2  Chloride 98 - 111 mmol/L 107 110 107  CO2 22 - 32 mmol/L 24 21(L) 25  Calcium 8.9 - 10.3 mg/dL 9.0 8.9 9.4  Total Protein 6.5 - 8.1 g/dL 7.1 7.1 7.1  Total Bilirubin 0.3 - 1.2 mg/dL 0.8 0.7 0.6  Alkaline Phos 38 - 126 U/L 102 108 89  AST 15 - 41 U/L _1 ALT 0 - 44 U/L _2 RADIOGRAPHIC STUDIES: I have personally reviewed the radiological images as listed and agreed with the findings in the report. CT CHEST ABDOMEN PELVIS W CONTRAST  Result Date: 03/19/2021 CLINICAL DATA:  History of colorectal cancer in a 70 year old male post chemotherapy. EXAM: CT CHEST, ABDOMEN, AND PELVIS WITH CONTRAST TECHNIQUE: Multidetector CT imaging of the chest, abdomen and pelvis was performed following the standard protocol during bolus administration of intravenous contrast. CONTRAST:  158m OMNIPAQUE IOHEXOL 300 MG/ML  SOLN COMPARISON:  Chest imaging from September of 2021 and CT of the abdomen and pelvis of March 03, 2020 FINDINGS: CT CHEST FINDINGS Cardiovascular: 4.6 cm ascending thoracic aortic caliber. 4 cm caliber of the proximal ascending arch. Similar appearance compared to prior imaging. Heart size mild to moderately enlarged without pericardial effusion. Central pulmonary vasculature unremarkable on venous phase assessment. Mediastinum/Nodes: No thoracic inlet lymphadenopathy. No axillary lymphadenopathy no mediastinal lymphadenopathy. Esophagus grossly normal. No hilar lymphadenopathy Lungs/Pleura: No consolidation. No pleural effusion. Small nodule along the minor  fissure in the RIGHT chest is stable compared to prior imaging. Small subpleural nodules and mild septal thickening in the LEFT lung apex also similar as is a lingular nodule. No new or suspicious nodules. Granuloma in the LEFT lung base. Largest nodule on today's study 6 mm (image 100, series 6) Musculoskeletal: See below for full musculoskeletal details. CT ABDOMEN PELVIS FINDINGS Hepatobiliary: No focal, suspicious hepatic lesion. Portal vein is patent. No pericholecystic stranding. No biliary duct dilation. Pancreas: Normal, without mass, inflammation or ductal dilatation. Spleen: Spleen normal size and contour. Adrenals/Urinary Tract: Adrenal glands are normal. Symmetric renal enhancement. No hydronephrosis. Urinary bladder with smooth contours. No suspicious renal lesion. Stomach/Bowel: Numerous small bowel diverticula with similar appearance. No adjacent stranding. No sign of small bowel obstruction or acute small bowel process. Normal appendix. Post partial colonic resection in the sigmoid with colorectal anastomosis in the upper pelvis showing no change compared to previous imaging. Vascular/Lymphatic: Calcified atheromatous plaque in the abdominal aorta. Smooth contour the IVC mildly flattened. There is no gastrohepatic or hepatoduodenal ligament lymphadenopathy. No retroperitoneal or mesenteric lymphadenopathy. No pelvic sidewall lymphadenopathy. Reproductive: Prostate with  heterogeneity and enlargement with similar appearance to prior imaging, nonspecific on CT. Other: No ascites. Musculoskeletal: Glenohumeral degenerative changes. Signs of fracture about the LEFT proximal femur extending along the medial margin of the greater trochanter obliquely with some fragmentation. This is at the margin of the chondroid lesion that is demonstrated in this location. No fracture through the lesser trochanter or femoral neck proper. Sclerosis of this area suggests this is not acute as do well corticated margins.  IMPRESSION: 1. LEFT femoral fracture obliquely through the greater trochanter. Sclerosis of the fracture fragment compatible with chronicity, this has however occurred since previous imaging. Pathologic fracture is considered as well given pattern of the fracture and adjacent chondroid lesion though this does not appear to passed through the chondroid lesion. Correlate with any history of trauma and consider orthopedic consultation given greater trochanteric involvement and presence of underlying lesion. 2. No signs of metastatic disease in the chest, abdomen or pelvis. 3. Ascending thoracic aortic aneurysm as before. Recommend semi-annual imaging followup by CTA or MRA and referral to cardiothoracic surgery if not already obtained. This recommendation follows 2010 ACCF/AHA/AATS/ACR/ASA/SCA/SCAI/SIR/STS/SVM Guidelines for the Diagnosis and Management of Patients With Thoracic Aortic Disease. Circulation. 2010; 121: V471-T95. Aortic aneurysm NOS (ICD10-I71.9) 4. Prostate with heterogeneity and enlargement with similar appearance to prior imaging, nonspecific on CT. 5. Aortic atherosclerosis. These results will be called to the ordering clinician or representative by the Radiologist Assistant, and communication documented in the PACS or [employer]. Aortic Atherosclerosis (ICD10-I70.0). Electronically Signed   By: Donzetta Kohut M.D.   On: 03/19/2021 12:44     ASSESSMENT & PLAN:  Christian Weeks is a 70 y.o. male with    1. Sigmoid colon adenocarcinoma, well differentiated, pT3N1b,M0, stage IIIB, MMR-normal -He was diagnosed in 02/2018. He is s/p sigmoidhemicolectomy and adjuvantchemo withCAPOX.  -Hehas recovered very well from chemotherapywith now mildresidualintermittent tinglingfingertips -His 06/2019 colonoscopy showed hemorrhoids, sigmoid diverticulosis andadenomaand hyperplasticpolyps. Repeat in 2022. -We discussed his CT CAP from 03/18/21 which showed no evidence of disease. I personally  reviewed scan with him including incidental findings.  -He is clinically doing well from colon cancer standpoint. Labs reviewed, CBC and CMP WNL. Physical exam unremarkable. There is no clinical concern for recurrence.  -He is 3 years since his cancer diagnosis. His risk of recurrence has significantly decreased. I do not plan to scan him again unless he develops concerning symptoms.  -F/u in5 months, then every 6 months.   2. GeneticTesting was negative  3. HTN, thoracic aneurysm -Stable 4.6 ascending thoracic aortic aneurysm followed by Dr. Tyrone Sage  -BP well controlled on losartan per PCP  4. 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 OnEliquisfrom 01/2016 - 08/2017. No recurrent edema. Followed by PCP and neuro.  -He remains active and has no concerning symptoms for blood clots.   5. COVID19 positive in 10/2019 -He has received both his COVID19 vaccines.   6. Osteoporosis, Left hip fracture  -He notes he had fall used leaf blower in Fall 2021. He injured his left hip but did not go to ED. He only used crutches and cane for a few weeks. He notes no more pain and ambulating well now.  -He was seen to have fracture on DEXA scan with Dr Renne Crigler and seen on CT CAP from 03/18/21.  -I discussed with osteoporosis he is at greater risk of fracture. I encouraged him to avoid fall or injury.  -He is on Vit D and will  start Calcium. I recommend he speak with Dr Shelia Media about bisphosphonate.    PLAN: -CT CAP reviewed, shows NED -Lab and F/u in 5 months   No problem-specific Assessment & Plan notes found for this encounter.   No orders of the defined types were placed in this encounter.  All questions were answered. The patient knows to call the clinic with any problems, questions or concerns. No barriers to learning was detected. The total time spent in the appointment was 30 minutes.     Truitt Merle, MD 03/19/2021   I, Joslyn Devon, am acting as scribe for Truitt Merle, MD.   I have reviewed the above documentation for accuracy and completeness, and I agree with the above.

## 2021-03-18 ENCOUNTER — Other Ambulatory Visit: Payer: Self-pay

## 2021-03-18 ENCOUNTER — Ambulatory Visit (HOSPITAL_COMMUNITY)
Admission: RE | Admit: 2021-03-18 | Discharge: 2021-03-18 | Disposition: A | Discharge status: Home / Self Care | Payer: Medicare HMO | Source: Ambulatory Visit | Attending: Nurse Practitioner | Admitting: Nurse Practitioner

## 2021-03-18 ENCOUNTER — Inpatient Hospital Stay: Payer: Medicare HMO | Attending: Hematology

## 2021-03-18 DIAGNOSIS — I700 Atherosclerosis of aorta: Secondary | ICD-10-CM | POA: Diagnosis not present

## 2021-03-18 DIAGNOSIS — Z86718 Personal history of other venous thrombosis and embolism: Secondary | ICD-10-CM | POA: Insufficient documentation

## 2021-03-18 DIAGNOSIS — Z85038 Personal history of other malignant neoplasm of large intestine: Secondary | ICD-10-CM | POA: Diagnosis not present

## 2021-03-18 DIAGNOSIS — Z8673 Personal history of transient ischemic attack (TIA), and cerebral infarction without residual deficits: Secondary | ICD-10-CM | POA: Insufficient documentation

## 2021-03-18 DIAGNOSIS — Z8616 Personal history of COVID-19: Secondary | ICD-10-CM | POA: Insufficient documentation

## 2021-03-18 DIAGNOSIS — Z79899 Other long term (current) drug therapy: Secondary | ICD-10-CM | POA: Diagnosis not present

## 2021-03-18 DIAGNOSIS — E039 Hypothyroidism, unspecified: Secondary | ICD-10-CM | POA: Insufficient documentation

## 2021-03-18 DIAGNOSIS — K6389 Other specified diseases of intestine: Secondary | ICD-10-CM | POA: Diagnosis not present

## 2021-03-18 DIAGNOSIS — R918 Other nonspecific abnormal finding of lung field: Secondary | ICD-10-CM | POA: Diagnosis not present

## 2021-03-18 DIAGNOSIS — Z9221 Personal history of antineoplastic chemotherapy: Secondary | ICD-10-CM | POA: Insufficient documentation

## 2021-03-18 DIAGNOSIS — C187 Malignant neoplasm of sigmoid colon: Secondary | ICD-10-CM

## 2021-03-18 DIAGNOSIS — M810 Age-related osteoporosis without current pathological fracture: Secondary | ICD-10-CM | POA: Diagnosis not present

## 2021-03-18 DIAGNOSIS — I119 Hypertensive heart disease without heart failure: Secondary | ICD-10-CM | POA: Diagnosis not present

## 2021-03-18 DIAGNOSIS — N400 Enlarged prostate without lower urinary tract symptoms: Secondary | ICD-10-CM | POA: Insufficient documentation

## 2021-03-18 DIAGNOSIS — J8410 Pulmonary fibrosis, unspecified: Secondary | ICD-10-CM | POA: Diagnosis not present

## 2021-03-18 DIAGNOSIS — C772 Secondary and unspecified malignant neoplasm of intra-abdominal lymph nodes: Secondary | ICD-10-CM | POA: Diagnosis not present

## 2021-03-18 DIAGNOSIS — I712 Thoracic aortic aneurysm, without rupture: Secondary | ICD-10-CM | POA: Diagnosis not present

## 2021-03-18 DIAGNOSIS — K5790 Diverticulosis of intestine, part unspecified, without perforation or abscess without bleeding: Secondary | ICD-10-CM | POA: Diagnosis not present

## 2021-03-18 LAB — CBC WITH DIFFERENTIAL (CANCER CENTER ONLY)
Abs Immature Granulocytes: 0.01 10*3/uL (ref 0.00–0.07)
Basophils Absolute: 0 10*3/uL (ref 0.0–0.1)
Basophils Relative: 1 %
Eosinophils Absolute: 0.3 10*3/uL (ref 0.0–0.5)
Eosinophils Relative: 5 %
HCT: 45.9 % (ref 39.0–52.0)
Hemoglobin: 15.4 g/dL (ref 13.0–17.0)
Immature Granulocytes: 0 %
Lymphocytes Relative: 26 %
Lymphs Abs: 1.6 10*3/uL (ref 0.7–4.0)
MCH: 30.1 pg (ref 26.0–34.0)
MCHC: 33.6 g/dL (ref 30.0–36.0)
MCV: 89.8 fL (ref 80.0–100.0)
Monocytes Absolute: 0.7 10*3/uL (ref 0.1–1.0)
Monocytes Relative: 10 %
Neutro Abs: 3.7 10*3/uL (ref 1.7–7.7)
Neutrophils Relative %: 58 %
Platelet Count: 170 10*3/uL (ref 150–400)
RBC: 5.11 MIL/uL (ref 4.22–5.81)
RDW: 13.3 % (ref 11.5–15.5)
WBC Count: 6.4 10*3/uL (ref 4.0–10.5)
nRBC: 0 % (ref 0.0–0.2)

## 2021-03-18 LAB — CMP (CANCER CENTER ONLY)
ALT: 17 U/L (ref 0–44)
AST: 19 U/L (ref 15–41)
Albumin: 3.9 g/dL (ref 3.5–5.0)
Alkaline Phosphatase: 102 U/L (ref 38–126)
Anion gap: 9 (ref 5–15)
BUN: 11 mg/dL (ref 8–23)
CO2: 24 mmol/L (ref 22–32)
Calcium: 9 mg/dL (ref 8.9–10.3)
Chloride: 107 mmol/L (ref 98–111)
Creatinine: 1.11 mg/dL (ref 0.61–1.24)
GFR, Estimated: >60 mL/min (ref >60)
Glucose, Bld: 96 mg/dL (ref 70–99)
Potassium: 4.7 mmol/L (ref 3.5–5.1)
Sodium: 140 mmol/L (ref 135–145)
Total Bilirubin: 0.8 mg/dL (ref 0.3–1.2)
Total Protein: 7.1 g/dL (ref 6.5–8.1)

## 2021-03-18 LAB — CEA (IN HOUSE-CHCC): CEA (CHCC-In House): <1 ng/mL (ref 0.00–5.00)

## 2021-03-18 MED ORDER — IOHEXOL 300 MG/ML  SOLN
100.0000 mL | Freq: Once | INTRAMUSCULAR | Status: AC | PRN
  Administered 2021-03-18: 100 mL via INTRAVENOUS

## 2021-03-18 MED ORDER — IOHEXOL 9 MG/ML PO SOLN
ORAL | Status: AC
  Administered 2021-03-18: 1000 mL via ORAL
  Filled 2021-03-18: qty 1000

## 2021-03-18 MED ORDER — IOHEXOL 9 MG/ML PO SOLN: 1000.0000 mL | ORAL | Status: AC

## 2021-03-19 ENCOUNTER — Encounter: Payer: Self-pay | Admitting: Hematology

## 2021-03-19 ENCOUNTER — Telehealth: Payer: Self-pay | Admitting: Hematology

## 2021-03-19 ENCOUNTER — Other Ambulatory Visit: Payer: Self-pay

## 2021-03-19 ENCOUNTER — Inpatient Hospital Stay (HOSPITAL_BASED_OUTPATIENT_CLINIC_OR_DEPARTMENT_OTHER): Payer: Medicare HMO | Admitting: Hematology

## 2021-03-19 VITALS — BP 115/84 | HR 70 | Temp 97.4°F | Resp 15 | Ht 72.0 in | Wt 247.0 lb

## 2021-03-19 DIAGNOSIS — Z8616 Personal history of COVID-19: Secondary | ICD-10-CM | POA: Diagnosis not present

## 2021-03-19 DIAGNOSIS — C772 Secondary and unspecified malignant neoplasm of intra-abdominal lymph nodes: Secondary | ICD-10-CM | POA: Diagnosis not present

## 2021-03-19 DIAGNOSIS — Z79899 Other long term (current) drug therapy: Secondary | ICD-10-CM | POA: Diagnosis not present

## 2021-03-19 DIAGNOSIS — N400 Enlarged prostate without lower urinary tract symptoms: Secondary | ICD-10-CM | POA: Diagnosis not present

## 2021-03-19 DIAGNOSIS — I712 Thoracic aortic aneurysm, without rupture: Secondary | ICD-10-CM | POA: Diagnosis not present

## 2021-03-19 DIAGNOSIS — E039 Hypothyroidism, unspecified: Secondary | ICD-10-CM | POA: Diagnosis not present

## 2021-03-19 DIAGNOSIS — Z86718 Personal history of other venous thrombosis and embolism: Secondary | ICD-10-CM | POA: Diagnosis not present

## 2021-03-19 DIAGNOSIS — I119 Hypertensive heart disease without heart failure: Secondary | ICD-10-CM | POA: Diagnosis not present

## 2021-03-19 DIAGNOSIS — C187 Malignant neoplasm of sigmoid colon: Secondary | ICD-10-CM | POA: Diagnosis not present

## 2021-03-19 DIAGNOSIS — M810 Age-related osteoporosis without current pathological fracture: Secondary | ICD-10-CM | POA: Diagnosis not present

## 2021-03-19 DIAGNOSIS — Z8673 Personal history of transient ischemic attack (TIA), and cerebral infarction without residual deficits: Secondary | ICD-10-CM | POA: Diagnosis not present

## 2021-03-19 DIAGNOSIS — Z85038 Personal history of other malignant neoplasm of large intestine: Secondary | ICD-10-CM | POA: Diagnosis not present

## 2021-03-19 NOTE — Telephone Encounter (Signed)
Scheduled appt per 3/25 los - gave patient AVS and calender per los.

## 2021-03-31 ENCOUNTER — Other Ambulatory Visit: Payer: Self-pay | Admitting: *Deleted

## 2021-03-31 DIAGNOSIS — I712 Thoracic aortic aneurysm, without rupture, unspecified: Secondary | ICD-10-CM

## 2021-05-05 ENCOUNTER — Encounter: Payer: Medicare HMO | Admitting: Surgery

## 2021-05-05 ENCOUNTER — Encounter: Payer: Self-pay | Admitting: Surgery

## 2021-05-05 ENCOUNTER — Telehealth (INDEPENDENT_AMBULATORY_CARE_PROVIDER_SITE_OTHER): Payer: Medicare HMO | Admitting: Surgery

## 2021-05-05 ENCOUNTER — Other Ambulatory Visit: Payer: Self-pay

## 2021-05-05 ENCOUNTER — Ambulatory Visit
Admission: RE | Admit: 2021-05-05 | Discharge: 2021-05-05 | Disposition: A | Discharge status: Home / Self Care | Payer: Medicare HMO | Source: Ambulatory Visit | Attending: Surgery | Admitting: Surgery

## 2021-05-05 DIAGNOSIS — I712 Thoracic aortic aneurysm, without rupture, unspecified: Secondary | ICD-10-CM

## 2021-05-05 DIAGNOSIS — J984 Other disorders of lung: Secondary | ICD-10-CM | POA: Diagnosis not present

## 2021-05-05 DIAGNOSIS — J9811 Atelectasis: Secondary | ICD-10-CM | POA: Diagnosis not present

## 2021-05-05 DIAGNOSIS — M47814 Spondylosis without myelopathy or radiculopathy, thoracic region: Secondary | ICD-10-CM | POA: Diagnosis not present

## 2021-05-05 MED ORDER — IOPAMIDOL (ISOVUE-370) INJECTION 76%
75.0000 mL | Freq: Once | INTRAVENOUS | Status: AC | PRN
  Administered 2021-05-05: 75 mL via INTRAVENOUS

## 2021-05-05 NOTE — Progress Notes (Signed)
Patient ID: Christian Weeks, male   DOB: 1951-05-08, 70 y.o.   MRN: [id number]      [address]       [place] [address]             [phone]     CARDIOTHORACIC SURGERY TELEPHONE VIRTUAL OFFICE NOTE  Referring Provider is [name], MD Primary Cardiologist is Will Meredith Leeds, MD PCP is Deland Pretty, MD   HPI:  I spoke with Christian Weeks (DOB 05/23/1951 ) via telephone on 05/05/2021 at 3:15 PM and verified that I was speaking with the correct person using more than one form of identification.  We discussed the fact that I was contacting them from my office and they were located at home, as well as the reason(s) for conducting our visit virtually instead of in-person.  The patient expressed understanding the circumstances and agreed to proceed as described.   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 Servando Snare for an ascending aortic aneurysm that was last measured at 4.7 cm by CT scan of the chest in September 2021.  He had a CTA of the chest today for routine follow-up of this aneurysm.  He has been feeling well.  He saw Dr. Burr Medico in March 2022 and his colon cancer is noted to be in remission.   Current Outpatient Medications  Medication Sig Dispense Refill  . acetaminophen (TYLENOL) 500 MG tablet Take 1,000 mg by mouth every 6 (six) hours as needed for moderate pain or headache.     . Cholecalciferol (VITAMIN D3) 3000 units TABS Take 3,000 Units by mouth daily.    Marland Kitchen levothyroxine (SYNTHROID) 125 MCG tablet Take 125 mcg by mouth daily before breakfast.     No current facility-administered medications for this visit.     Diagnostic Tests:  Narrative & Impression  CLINICAL DATA:  Thoracic aortic prominence  EXAM: CT ANGIOGRAPHY CHEST WITH CONTRAST  TECHNIQUE: Multidetector CT imaging of the chest was performed using the standard protocol during  bolus administration of intravenous contrast. Multiplanar CT image reconstructions and MIPs were obtained to evaluate the vascular anatomy.  CONTRAST:  63mL ISOVUE-370 IOPAMIDOL (ISOVUE-370) INJECTION 76%  COMPARISON:  Chest CT March 18, 2021 and February 20, 2020  FINDINGS: Cardiovascular: Ascending thoracic aortic diameter measures 4.8 x 4.8 cm, stable by remeasurement. The measured diameter of the aorta at the level the sinuses of Valsalva measures 3.9 cm. The measured diameter at the sinotubular junction measures 2.8 cm. These measurements are stable compared to prior study. The measured diameter of the aorta at the arch level measures 3.7 cm, stable. Measured diameter of the descending aorta the main pulmonary outflow tract measures 3.2 x 3.2 cm, stable. There is tortuosity of the distal descending thoracic aorta, stable. No evident dissection. Visualized great vessels appear unremarkable. No appreciable pericardial effusion or pericardial thickening. There is no appreciable pulmonary embolus.  Mediastinum/Nodes: There is a 7 mm nodular opacity in the right lobe of the thyroid which per consensus guidelines does not warrant additional imaging surveillance. No appreciable adenopathy. No evident esophageal lesions.  Lungs/Pleura: There are scattered areas of mild scarring. There is mild bibasilar atelectasis. There is no edema or airspace consolidation. No evident pleural effusions. There is a stable nodular opacity in the inferior lingula on axial slice 712 series 6 measuring 6 x 5 mm. There is a stable nodular opacity  in the right middle lobe, medial segment measuring 7 x 5 mm, seen on axial slice 83 series 6. Scarring in the posterior aspect of the left upper lobe is stable. No new parenchymal lung opacities evident. No pneumothorax. Trachea and major bronchial structures appear normal.  Upper Abdomen: Visualized upper abdominal structures appear  Musculoskeletal:  There is degenerative change in the thoracic spine. No blastic or lytic bone lesions are evident. No evident chest wall lesions.  Review of the MIP images confirms the above findings.  IMPRESSION: 1. Prominence of the ascending thoracic aorta measuring 4.8 x 4.8 cm, stable by remeasurement. Other measurements of the aorta as noted. No dissection.  2.  No evident pulmonary embolus.  3. Areas of mild scarring and atelectasis. Nodular opacities measuring 7 x 5 mm in the right middle lobe and 6 x 5 mm in the lingula appear stable. No new parenchymal lung lesions.  4.  No evident adenopathy.  Aortic aneurysm NOS (ICD10-I71.9).   Electronically Signed   By: Lowella Grip III M.D.   On: 05/05/2021 10:23       Impression:  He has a stable 4.8 cm fusiform ascending aortic aneurysm with prior echocardiogram in 2017 showing a trileaflet aortic valve without stenosis or insufficiency.  His aneurysm is well below the surgical threshold of 5.5 cm.  I reviewed the CT scan findings with him and answered all of his questions.  I stressed the importance of continued good blood pressure control in preventing further enlargement and acute aortic dissection.  Plan:  He will return to see me in 1 year with a CTA of the chest.    I discussed limitations of evaluation and management via telephone.  The patient was advised to call back for repeat telephone consultation or to seek an in-person evaluation if questions arise or the patient's clinical condition changes in any significant manner.  I spent 10 minutes of non-face-to-face time during the conduct of this telephone virtual office consultation, including pre-visit review of the patient's records and direct conversation with the patient.     Gaye Pollack, MD 05/05/2021 3:45 PM

## 2021-08-19 ENCOUNTER — Other Ambulatory Visit: Payer: Self-pay

## 2021-08-19 ENCOUNTER — Inpatient Hospital Stay: Payer: Medicare HMO

## 2021-08-19 ENCOUNTER — Inpatient Hospital Stay: Payer: Medicare HMO | Attending: Hematology | Admitting: Hematology

## 2021-08-19 ENCOUNTER — Encounter: Payer: Self-pay | Admitting: Hematology

## 2021-08-19 VITALS — BP 123/94 | HR 64 | Temp 98.2°F | Resp 19 | Ht 72.0 in | Wt 251.9 lb

## 2021-08-19 DIAGNOSIS — Z8673 Personal history of transient ischemic attack (TIA), and cerebral infarction without residual deficits: Secondary | ICD-10-CM | POA: Diagnosis not present

## 2021-08-19 DIAGNOSIS — C772 Secondary and unspecified malignant neoplasm of intra-abdominal lymph nodes: Secondary | ICD-10-CM

## 2021-08-19 DIAGNOSIS — E039 Hypothyroidism, unspecified: Secondary | ICD-10-CM | POA: Diagnosis not present

## 2021-08-19 DIAGNOSIS — Z85038 Personal history of other malignant neoplasm of large intestine: Secondary | ICD-10-CM | POA: Diagnosis not present

## 2021-08-19 DIAGNOSIS — Z7901 Long term (current) use of anticoagulants: Secondary | ICD-10-CM | POA: Insufficient documentation

## 2021-08-19 DIAGNOSIS — C187 Malignant neoplasm of sigmoid colon: Secondary | ICD-10-CM

## 2021-08-19 DIAGNOSIS — Z79899 Other long term (current) drug therapy: Secondary | ICD-10-CM | POA: Diagnosis not present

## 2021-08-19 LAB — CBC WITH DIFFERENTIAL (CANCER CENTER ONLY)
Abs Immature Granulocytes: 0.01 10*3/uL (ref 0.00–0.07)
Basophils Absolute: 0 10*3/uL (ref 0.0–0.1)
Basophils Relative: 1 %
Eosinophils Absolute: 0.4 10*3/uL (ref 0.0–0.5)
Eosinophils Relative: 7 %
HCT: 47 % (ref 39.0–52.0)
Hemoglobin: 16.1 g/dL (ref 13.0–17.0)
Immature Granulocytes: 0 %
Lymphocytes Relative: 22 %
Lymphs Abs: 1.4 10*3/uL (ref 0.7–4.0)
MCH: 30.8 pg (ref 26.0–34.0)
MCHC: 34.3 g/dL (ref 30.0–36.0)
MCV: 90 fL (ref 80.0–100.0)
Monocytes Absolute: 0.8 10*3/uL (ref 0.1–1.0)
Monocytes Relative: 12 %
Neutro Abs: 3.7 10*3/uL (ref 1.7–7.7)
Neutrophils Relative %: 58 %
Platelet Count: 161 10*3/uL (ref 150–400)
RBC: 5.22 MIL/uL (ref 4.22–5.81)
RDW: 13 % (ref 11.5–15.5)
WBC Count: 6.3 10*3/uL (ref 4.0–10.5)
nRBC: 0 % (ref 0.0–0.2)

## 2021-08-19 LAB — CMP (CANCER CENTER ONLY)
ALT: 18 U/L (ref 0–44)
AST: 20 U/L (ref 15–41)
Albumin: 3.9 g/dL (ref 3.5–5.0)
Alkaline Phosphatase: 93 U/L (ref 38–126)
Anion gap: 7 (ref 5–15)
BUN: 13 mg/dL (ref 8–23)
CO2: 24 mmol/L (ref 22–32)
Calcium: 9.1 mg/dL (ref 8.9–10.3)
Chloride: 109 mmol/L (ref 98–111)
Creatinine: 1.18 mg/dL (ref 0.61–1.24)
GFR, Estimated: >60 mL/min (ref >60)
Glucose, Bld: 96 mg/dL (ref 70–99)
Potassium: 4.5 mmol/L (ref 3.5–5.1)
Sodium: 140 mmol/L (ref 135–145)
Total Bilirubin: 0.9 mg/dL (ref 0.3–1.2)
Total Protein: 7.2 g/dL (ref 6.5–8.1)

## 2021-08-19 LAB — CEA (IN HOUSE-CHCC): CEA (CHCC-In House): 1.21 ng/mL (ref 0.00–5.00)

## 2021-08-19 NOTE — Progress Notes (Signed)
[facility]   [phone] Fax:([phone]) [phone]   Clinic Follow up Note   Patient Care Team: Deland Pretty, MD as PCP - General (Internal Medicine) Constance Haw, MD as PCP - Cardiology (Cardiology)  Date of Service:  08/19/2021  CHIEF COMPLAINT: f/u of sigmoid colon adenocarcinoma  ASSESSMENT & PLAN:  Christian Weeks is a 70 y.o. male with   1. Sigmoid colon adenocarcinoma, well differentiated, pT3N1b,M0, stage IIIB, MMR-normal -He was diagnosed in 02/2018. He is s/p sigmoid hemicolectomy and adjuvant chemo with CAPOX.  -He has recovered very well from chemotherapy with now mild residual intermittent tingling finger tips -His 06/2019 colonoscopy showed hemorrhoids, sigmoid diverticulosis and adenoma and hyperplastic polyps. Repeat in 2022.  -CT CAP from 03/18/21 which showed no evidence of disease. -He is clinically doing well from colon cancer standpoint. Labs reviewed, CBC and CMP WNL. Physical exam unremarkable. There is no clinical concern for recurrence.  -He is 3.5 years since his cancer diagnosis. His risk of recurrence has significantly decreased. I do not plan to scan him again unless he develops concerning symptoms.  -F/u in 6 months,with NP Lacie    2. Genetic Testing was negative    3. HTN, thoracic aneurysm -Stable 4.6 ascending thoracic aortic aneurysm, now followed by Dr. Cyndia Bent -BP well controlled on losartan per PCP    4. 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 On Eliquis from 01/2016 - 08/2017. No recurrent edema. Followed by PCP and neuro.   -He remains active and has no concerning symptoms for blood clots.    5. COVID19 positive in 10/2019 -He has received both his COVID19 vaccines.    6. Osteoporosis, Left hip fracture  -He notes he had fall used leaf blower in Fall 2021. He injured his left hip but did not go to ED. He only used crutches and cane for a few weeks. He notes no more  pain and ambulating well now.  -He was seen to have fracture on DEXA scan with Dr Shelia Media and seen on CT CAP from 03/18/21.      PLAN: -Lab and F/u in 6 months with NP Lacie   No problem-specific Assessment & Plan notes found for this encounter.   SUMMARY OF ONCOLOGIC HISTORY: Oncology History Overview Note  Cancer Staging Colon cancer [facility]) Staging form: Colon and Rectum, AJCC 8th Edition - Pathologic stage from 03/08/2018: Stage IIIB (pT3, pN1b, cM0) - Signed by Alla Feeling, NP on 04/05/2018     Cancer of sigmoid colon metastatic to intra-abdominal lymph node ([facility])  02/26/2018 Procedure   COLONOSCOPY per Dr. Watt Climes -An infiltrative non-obstructing small mass was found in the mid sigmoid colon. Mass was partially circumferential (involving one third of the lumen circumference). The mass measured 2 cm in length, diameter was 2 mm. No bleeding was present.   -external and internal hemorrhoids -diverticulosis in the sigmoid colon, in the descending colon, in the transverse colon, and at the hepatic flexure -one medium polyp in the mid transverse colon  -ten small polyps in the rectum, in the sigmoid colon, in the descending colon, and in the transverse colon -likely malignant tumor in the mid sigmoid colon -exam was otherwise normal    02/26/2018 Initial Biopsy   From colonoscopy: 1. LG intestine- transverse colon, descending, polyp -Tubular adenomas (8). Sessile serrated adenoma/polyp 2. LG intestine - sigmoid colon bx -INVASIVE WELL DIFFERENTIATED ADENOCARCINOMA 3. LG intestine -sigmoid colon, rectum, polyp -Tubular adenoma with  high grade dysplasia. Hypoplastic polyps (3).   02/26/2018 Initial Diagnosis   Colon cancer ([facility])   02/28/2018 Imaging   CT AP IMPRESSION: -No evidence of metastatic disease or other acute findings. -Colonic diverticulosis, without radiographic evidence of diverticulitis. -Mild hepatic steatosis. -Mildly enlarged prostate.     03/07/2018 Tumor Marker    CEA 1.3 (pre-op)   03/08/2018 Surgery   PROCEDURE:  Procedure(s): LAPAROSCOPIC ASSISTED SIGMOID COLECTOMY ERAS PATHWAY  MOBILIZATION OF SPLENIC FLEXURE RIGID SIGMOIDOSCOPY   03/08/2018 Cancer Staging   Staging form: Colon and Rectum, AJCC 8th Edition - Pathologic stage from 03/08/2018: Stage IIIB (pT3, pN1b, cM0) - Signed by Alla Feeling, NP on 04/05/2018   03/08/2018 Pathology Results   Diagnosis 1. Colon, segmental resection for tumor, sigmoid, stitch marks distal - INVASIVE ADENOCARCINOMA, MODERATELY DIFFERENTIATED, SPANNING 2.4 CM. - TUMOR FOCALLY INVADES THROUGH MUSCULARIS PROPRIA. - RESECTION MARGIN IS NEGATIVE. - METASTATIC CARCINOMA IN TWO OF TWENTY LYMPH NODES (2/20). - DIVERTICULOSIS. - HYPERPLASTIC POLYP (X2). - SEE ONCOLOGY TABLE. -MMR normal    04/09/2018 Imaging   CT Chest WO Contrast 04/09/18 IMPRESSION: 1. Stable exam. Scattered bilateral tiny pulmonary nodules without interval change. No new or progressive pulmonary nodule or mass. Attention on follow-up may be warranted. 2. Ascending thoracic aorta measures up to 4.9 cm maximum diameter, increased from 4.6 cm on prior study. Ascending thoracic aortic aneurysm. Recommend semi-annual imaging followup by CTA or MRA and referral to cardiothoracic surgery if not already obtained. This recommendation follows 2010 ACCF/AHA/AATS/ACR/ASA/SCA/SCAI/SIR/STS/SVM Guidelines for the Diagnosis and Management of Patients With Thoracic Aortic Disease. Circulation. 2010; 121: D664-Q034   04/13/2018 - 06/29/2018 Chemotherapy   Adjuvant CAPOX every 3 weeks. Oxaliplatin 167m/m2, 2000 mg Xeloda BID on days 1-14, every 21 days starting 04/13/18. Dose reduced for first cycle. Last oxaliplatin treatment 06/15/18 at reduced dose due to neuropathy and fatigue, complete Xeloda on 06/29/18   06/04/2018 Genetic Testing   06/04/18 Testing did not reveal a pathogenic mutation in any of the genes analyzed.  A copy of the genetic test report will  be scanned into Epic under the Media tab.   The genes analyzed were the 83 genes on [facility]'s Multi-Cancer panel (ALK, APC, ATM, AXIN2, BAP1, BARD1, BLM, BMPR1A, BRCA1, BRCA2, BRIP1, CASR, CDC73, CDH1, CDK4, CDKN1B, CDKN1C, CDKN2A, CEBPA, CHEK2, CTNNA1, DICER1, DIS3L2, EGFR, EPCAM, FH, FLCN, GATA2, GPC3, GREM1, HOXB13, HRAS, KIT, MAX, MEN1, MET, MITF, MLH1, MSH2, MSH3, MSH6, MUTYH, NBN, NF1, NF2, NTHL1, PALB2, PDGFRA, PHOX2B, PMS2, POLD1, POLE, POT1, PRKAR1A, PTCH1, PTEN, RAD50, RAD51C, RAD51D, RB1, RECQL4, RET, RUNX1, SDHA, SDHAF2, SDHB, SDHC, SDHD, SMAD4, SMARCA4, SMARCB1, SMARCE1, STK11, SUFU, TERC, TERT, TMEM127, TP53, TSC1, TSC2, VHL, WRN, WT1).    01/23/2019 Imaging   CT AP W Contrast 01/23/19  IMPRESSION: Status post partial left hemicolectomy. No evidence of recurrent or metastatic disease.  Additional stable ancillary findings as above. Dedicated CTA chest reported separately.    CT Angio Chest 01/23/19  IMPRESSION: 1. Stable aneurysmal disease of the ascending thoracic aorta measuring 4.7 cm in greatest diameter. No dissection. 2. Stable mild cardiac enlargement. 3. Stable small bilateral pulmonary nodules demonstrating stability since January, 2018. Aortic aneurysm NOS (ICD10-I71.9).   02/20/2020 Imaging   CT Angio Chest  IMPRESSION: 1. Ascending thoracic aorta at 4.7 cm, stable. 2. Stable mild cardiac enlargement. 3. Increasing subpleural reticulation in both lungs since the previous study may represent developing fibrosis. But is nonspecific. Correlate with any recent infection and consider three-month follow-up with high-resolution chest CT. This could also be  utilized to evaluate the small peripheral nodule seen in the left chest and the slightly enlarged nodule seen in the left upper lobe as described. 4. Nodular hepatic contours similar to prior exam.   Aortic Atherosclerosis (ICD10-I70.0).   03/03/2020 Imaging   CT AP IMPRESSION: 1. No evidence of recurrent or  metastatic carcinoma within the abdomen or pelvis. 2. Stable moderately enlarged prostate.  No acute findings.     03/18/2021 Imaging   CT CAP  IMPRESSION: 1. LEFT femoral fracture obliquely through the greater trochanter. Sclerosis of the fracture fragment compatible with chronicity, this has however occurred since previous imaging. Pathologic fracture is considered as well given pattern of the fracture and adjacent chondroid lesion though this does not appear to passed through the chondroid lesion. Correlate with any history of trauma and consider orthopedic consultation given greater trochanteric involvement and presence of underlying lesion. 2. No signs of metastatic disease in the chest, abdomen or pelvis. 3. Ascending thoracic aortic aneurysm as before. Recommend semi-annual imaging followup by CTA or MRA and referral to cardiothoracic surgery if not already obtained. This recommendation follows 2010 ACCF/AHA/AATS/ACR/ASA/SCA/SCAI/SIR/STS/SVM Guidelines for the Diagnosis and Management of Patients With Thoracic Aortic Disease. Circulation. 2010; 121: B510-C58. Aortic aneurysm NOS (ICD10-I71.9) 4. Prostate with heterogeneity and enlargement with similar appearance to prior imaging, nonspecific on CT. 5. Aortic atherosclerosis.   These results will be called to the ordering clinician or representative by the Radiologist Assistant, and communication documented in the PACS or [employer].   Aortic Atherosclerosis (ICD10-I70.0).      CURRENT THERAPY:  Surveillance  INTERVAL HISTORY:  Christian Weeks is here for a follow up of colon cancer. He was last seen by me on 03/19/21. He presents to the clinic alone. 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   All other systems were reviewed with the patient and are negative.  MEDICAL HISTORY:   Past Medical History:  Diagnosis Date   Arthritis    Blood clot in vein 01/2016   Colon cancer [facility])    Colon polyps    Degenerative joint disease    Genetic testing 06/11/18   Multi-Cancer panel (83 genes) @ [facility] - No pathogenic mutations detected   History of kidney stones age 69's   Hypertension    Hypothyroidism    Mass of colon    sigmoid   Stroke ([facility]) 01/2016   speech difficulty for a few hours after knee replacement left   Thoracic ascending aortic aneurysm ([facility])    4.6-4.7 cm followed by Dr Servando Snare- LOV- 08/2017-epic    Wears dentures    Wears glasses     SURGICAL HISTORY: Past Surgical History:  Procedure Laterality Date   colonscopy  02/26/2018   KNEE ARTHROPLASTY Left 01/15/2016   Procedure: COMPUTER ASSISTED TOTAL KNEE ARTHROPLASTY;  Surgeon: [name], MD;  Location: [facility];  Service: Orthopedics;  Laterality: Left;   LAPAROSCOPIC SIGMOID COLECTOMY N/A 03/08/2018   Procedure: LAPAROSCOPIC ASSISTED SIGMOID COLECTOMY ERAS PATHWAY;  Surgeon: Jovita Kussmaul, MD;  Location: [facility];  Service: General;  Laterality: N/A;   MULTIPLE TOOTH EXTRACTIONS     TOTAL KNEE ARTHROPLASTY Right 01/13/2017   Procedure: RIGHT TOTAL KNEE ARTHROPLASTY;  Surgeon: [name], MD;  Location: [facility];  Service: Orthopedics;  Laterality: Right;    I have reviewed the social history and family history  with the patient and they are unchanged from previous note.  ALLERGIES:  is allergic to quinolones and poison oak extract.  MEDICATIONS:  Current Outpatient Medications  Medication Sig Dispense Refill   Cholecalciferol (VITAMIN D3) 3000 units TABS Take 3,000 Units by mouth daily.     levothyroxine (SYNTHROID) 125 MCG tablet Take 125 mcg by mouth daily before breakfast.     No current facility-administered medications for this visit.    PHYSICAL EXAMINATION: ECOG PERFORMANCE STATUS: 0 - Asymptomatic  Vitals:   08/19/21 0845  BP: (!) 123/94  Pulse: 64  Resp: 19  Temp: 98.2 F  (36.8 C)  SpO2: 95%   Wt Readings from Last 3 Encounters:  08/19/21 251 lb 14.4 oz (114.3 kg)  03/19/21 247 lb (112 kg)  11/17/20 242 lb (109.8 kg)     GENERAL:alert, no distress and comfortable SKIN: skin color, texture, turgor are normal, no rashes or significant lesions EYES: normal, Conjunctiva are pink and non-injected, sclera clear  NECK: supple, thyroid normal size, non-tender, without nodularity LYMPH:  no palpable lymphadenopathy in the cervical, axillary  LUNGS: clear to auscultation and percussion with normal breathing effort HEART: regular rate & rhythm and no murmurs and no lower extremity edema ABDOMEN:abdomen soft, non-tender and normal bowel sounds Musculoskeletal:no cyanosis of digits and no clubbing  NEURO: alert & oriented x 3 with fluent speech, no focal motor/sensory deficits  LABORATORY DATA:  I have reviewed the data as listed CBC Latest Ref Rng & Units 08/19/2021 03/18/2021 11/17/2020  WBC 4.0 - 10.5 K/uL 6.3 6.4 6.1  Hemoglobin 13.0 - 17.0 g/dL 16.1 15.4 15.7  Hematocrit 39.0 - 52.0 % 47.0 45.9 47.2  Platelets 150 - 400 K/uL 161 170 184     CMP Latest Ref Rng & Units 08/19/2021 03/18/2021 11/17/2020  Glucose 70 - 99 mg/dL 96 96 104(H)  BUN 8 - 23 mg/dL '13 11 12  ' Creatinine 0.61 - 1.24 mg/dL 1.18 1.11 1.19  Sodium 135 - 145 mmol/L 140 140 141  Potassium 3.5 - 5.1 mmol/L 4.5 4.7 3.9  Chloride 98 - 111 mmol/L 109 107 110  CO2 22 - 32 mmol/L 24 24 21(L)  Calcium 8.9 - 10.3 mg/dL 9.1 9.0 8.9  Total Protein 6.5 - 8.1 g/dL 7.2 7.1 7.1  Total Bilirubin 0.3 - 1.2 mg/dL 0.9 0.8 0.7  Alkaline Phos 38 - 126 U/L 93 102 108  AST 15 - 41 U/L '20 19 18  ' ALT 0 - 44 U/L '18 17 13      ' RADIOGRAPHIC STUDIES: I have personally reviewed the radiological images as listed and agreed with the findings in the report. No results found.    No orders of the defined types were placed in this encounter.  All questions were answered. The patient knows to call the clinic  with any problems, questions or concerns. No barriers to learning was detected. The total time spent in the appointment was 20 minutes.     Truitt Merle, MD 08/19/2021   I, Wilburn Mylar, am acting as scribe for Truitt Merle, MD.   I have reviewed the above documentation for accuracy and completeness, and I agree with the above.

## 2021-10-18 DIAGNOSIS — I10 Essential (primary) hypertension: Secondary | ICD-10-CM | POA: Diagnosis not present

## 2021-10-18 DIAGNOSIS — Z125 Encounter for screening for malignant neoplasm of prostate: Secondary | ICD-10-CM | POA: Diagnosis not present

## 2021-10-18 DIAGNOSIS — E039 Hypothyroidism, unspecified: Secondary | ICD-10-CM | POA: Diagnosis not present

## 2021-10-21 DIAGNOSIS — E032 Hypothyroidism due to medicaments and other exogenous substances: Secondary | ICD-10-CM | POA: Diagnosis not present

## 2021-10-21 DIAGNOSIS — E785 Hyperlipidemia, unspecified: Secondary | ICD-10-CM | POA: Diagnosis not present

## 2021-10-21 DIAGNOSIS — Z85038 Personal history of other malignant neoplasm of large intestine: Secondary | ICD-10-CM | POA: Diagnosis not present

## 2021-10-21 DIAGNOSIS — Z0000 Encounter for general adult medical examination without abnormal findings: Secondary | ICD-10-CM | POA: Diagnosis not present

## 2021-10-21 DIAGNOSIS — Z23 Encounter for immunization: Secondary | ICD-10-CM | POA: Diagnosis not present

## 2021-10-21 DIAGNOSIS — I6529 Occlusion and stenosis of unspecified carotid artery: Secondary | ICD-10-CM | POA: Diagnosis not present

## 2021-10-21 DIAGNOSIS — Z923 Personal history of irradiation: Secondary | ICD-10-CM | POA: Diagnosis not present

## 2021-10-21 DIAGNOSIS — I10 Essential (primary) hypertension: Secondary | ICD-10-CM | POA: Diagnosis not present

## 2021-10-21 DIAGNOSIS — M810 Age-related osteoporosis without current pathological fracture: Secondary | ICD-10-CM | POA: Diagnosis not present

## 2021-10-21 DIAGNOSIS — J849 Interstitial pulmonary disease, unspecified: Secondary | ICD-10-CM | POA: Diagnosis not present

## 2021-11-16 DIAGNOSIS — E785 Hyperlipidemia, unspecified: Secondary | ICD-10-CM | POA: Diagnosis not present

## 2021-11-25 DIAGNOSIS — Z8673 Personal history of transient ischemic attack (TIA), and cerebral infarction without residual deficits: Secondary | ICD-10-CM | POA: Diagnosis not present

## 2021-11-25 DIAGNOSIS — M6289 Other specified disorders of muscle: Secondary | ICD-10-CM | POA: Diagnosis not present

## 2021-11-25 DIAGNOSIS — G4762 Sleep related leg cramps: Secondary | ICD-10-CM | POA: Diagnosis not present

## 2021-11-25 DIAGNOSIS — E559 Vitamin D deficiency, unspecified: Secondary | ICD-10-CM | POA: Diagnosis not present

## 2021-11-25 DIAGNOSIS — E785 Hyperlipidemia, unspecified: Secondary | ICD-10-CM | POA: Diagnosis not present

## 2021-11-25 DIAGNOSIS — M810 Age-related osteoporosis without current pathological fracture: Secondary | ICD-10-CM | POA: Diagnosis not present

## 2021-11-25 DIAGNOSIS — R5383 Other fatigue: Secondary | ICD-10-CM | POA: Diagnosis not present

## 2021-11-25 DIAGNOSIS — E039 Hypothyroidism, unspecified: Secondary | ICD-10-CM | POA: Diagnosis not present

## 2021-11-25 DIAGNOSIS — I10 Essential (primary) hypertension: Secondary | ICD-10-CM | POA: Diagnosis not present

## 2021-12-07 ENCOUNTER — Ambulatory Visit: Payer: Medicare HMO | Admitting: Pulmonary Disease

## 2021-12-07 ENCOUNTER — Encounter: Payer: Self-pay | Admitting: Pulmonary Disease

## 2021-12-07 ENCOUNTER — Other Ambulatory Visit: Payer: Self-pay

## 2021-12-07 VITALS — BP 128/66 | HR 56 | Temp 97.9°F | Ht 72.0 in | Wt 249.8 lb

## 2021-12-07 DIAGNOSIS — R911 Solitary pulmonary nodule: Secondary | ICD-10-CM | POA: Diagnosis not present

## 2021-12-07 NOTE — Progress Notes (Signed)
Christian Weeks    [id number]    Sep 17, 1951  Primary Care Physician:Pharr, Thayer Jew, MD  Referring Physician: Deland Pretty, MD [address] 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 Developed COVID-19 in November 2020 he was unwell for about 4 weeks but did not require hospitalization  States that breathing is doing well with no issues.  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  No ongoing exposures ILD questionnaire 12/07/2021-negative Smoking history: 26-pack-year smoker.  Quit in 1996 Travel history: No significant travel history Relevant family history: No family history of lung disease   Outpatient Encounter Medications as of 12/07/2021  Medication Sig   Cholecalciferol (VITAMIN D3) 3000 units TABS Take 3,000 Units by mouth daily.   levothyroxine (SYNTHROID) 125 MCG tablet Take 125 mcg by mouth daily before breakfast.   rosuvastatin (CRESTOR) 10 MG tablet Take 10 mg by mouth every other day.   No facility-administered encounter medications on file as of 12/07/2021.    Allergies as of 12/07/2021 - Review Complete 12/07/2021  Allergen Reaction Noted   Quinolones  01/28/2019   Poison oak extract Rash 03/08/2018    Past Medical History:  Diagnosis Date   Arthritis    Blood clot in vein 01/2016   Colon cancer ([place])    Colon polyps    Degenerative joint disease    Genetic testing 06/11/18   Multi-Cancer panel (83 genes) @ [facility] - No pathogenic mutations detected   History of kidney stones age 15's   Hypertension    Hypothyroidism    Mass of colon     sigmoid   Stroke ([place]) 01/2016   speech difficulty for a few hours after knee replacement left   Thoracic ascending aortic aneurysm    4.6-4.7 cm followed by Dr Servando Snare- LOV- 08/2017-epic    Wears dentures    Wears glasses     Past Surgical History:  Procedure Laterality Date   colonscopy  02/26/2018   KNEE ARTHROPLASTY Left 01/15/2016   Procedure: COMPUTER ASSISTED TOTAL KNEE ARTHROPLASTY;  Surgeon: [name], MD;  Location: [facility];  Service: Orthopedics;  Laterality: Left;   LAPAROSCOPIC SIGMOID COLECTOMY N/A 03/08/2018   Procedure: LAPAROSCOPIC ASSISTED SIGMOID COLECTOMY ERAS PATHWAY;  Surgeon: Jovita Kussmaul, MD;  Location: [facility];  Service: General;  Laterality: N/A;   MULTIPLE TOOTH EXTRACTIONS     TOTAL KNEE ARTHROPLASTY Right 01/13/2017   Procedure: RIGHT TOTAL KNEE ARTHROPLASTY;  Surgeon: [name], MD;  Location: [facility];  Service: Orthopedics;  Laterality: Right;    Family History  Problem Relation Age of Onset   Congestive Heart Failure Mother    Diabetes Mother    Thyroid disease Sister     Social History   Socioeconomic History   Marital status: Widowed    Spouse name: Not on file   Number of children: Not on file   Years of education: Not on file   Highest education level: Not on file  Occupational History   Occupation: part time job - delivers car parts  Tobacco Use   Smoking status: Former  Packs/day: 1.50    Years: 30.00    Pack years: 45.00    Types: Cigarettes    Quit date: 11/16/1996    Years since quitting: 25.0   Smokeless tobacco: Never   Tobacco comments:    quit 25 yrs ago  Vaping Use   Vaping Use: Never used  Substance and Sexual Activity   Alcohol use: No   Drug use: No   Sexual activity: Not on file  Other Topics Concern   Not on file  Social History Narrative   Not on file   Social Determinants of Health   Financial Resource Strain: Not on file  Food Insecurity: Not on file  Transportation Needs: Not on file   Physical Activity: Not on file  Stress: Not on file  Social Connections: Not on file  Intimate Partner Violence: Not on file    Review of systems: Review of Systems  Constitutional: Negative for fever and chills.  HENT: Negative.   Eyes: Negative for blurred vision.  Respiratory: as per HPI  Cardiovascular: Negative for chest pain and palpitations.  Gastrointestinal: Negative for vomiting, diarrhea, blood per rectum. Genitourinary: Negative for dysuria, urgency, frequency and hematuria.  Musculoskeletal: Negative for myalgias, back pain and joint pain.  Skin: Negative for itching and rash.  Neurological: Negative for dizziness, tremors, focal weakness, seizures and loss of consciousness.  Endo/Heme/Allergies: Negative for environmental allergies.  Psychiatric/Behavioral: Negative for depression, suicidal ideas and hallucinations.  All other systems reviewed and are negative.  Physical Exam: Blood pressure 128/66, pulse (!) 56, temperature 97.9 F (36.6 C), temperature source Oral, height 6' (1.829 m), weight 249 lb 12.8 oz (113.3 kg), SpO2 98 %. Gen:      No acute distress HEENT:  EOMI, sclera anicteric Neck:     No masses; no thyromegaly Lungs:    Clear to auscultation bilaterally; normal respiratory effort CV:         Regular rate and rhythm; no murmurs Abd:      + bowel sounds; soft, non-tender; no palpable masses, no distension Ext:    No edema; adequate peripheral perfusion Skin:      Warm and dry; no rash Neuro: alert and oriented x 3 Psych: normal mood and affect  Data Reviewed: Imaging: CT chest 08/27/2020-subcentimeter pulmonary nodule, ascending thoracic aortic aneurysm.  Subpleural reticulation and coronary atherosclerosis CT chest 03/18/2021-subcentimeter pulmonary nodules CTA chest 05/05/21-ascending thoracic aneurysm, no PE, stable lung nodules. I have reviewed the images personally  PFTs:  Labs:  Assessment:  Abnormal CT His CT shows lung nodules which  are likely benign.  There is mention of subpleural reticulation on a prior CT scan but not evident on more recent imaging studies  We will get a high-res CT for better evaluation of the lungs  Plan/Recommendations: High-resolution CT  Marshell Garfinkel MD [facility] 12/07/2021, 9:13 AM  CC: Deland Pretty, MD

## 2021-12-07 NOTE — Patient Instructions (Addendum)
I have reviewed his CT which shows some small lung nodules which look benign.  There is no significant interstitial lung disease We will get a high-resolution CT for better evaluation of your lungs Return to clinic in 1 month for review and plan for next s

## 2022-01-04 ENCOUNTER — Ambulatory Visit
Admission: RE | Admit: 2022-01-04 | Discharge: 2022-01-04 | Disposition: A | Discharge status: Home / Self Care | Payer: Medicare HMO | Source: Ambulatory Visit | Attending: Pulmonary Disease | Admitting: Pulmonary Disease

## 2022-01-04 DIAGNOSIS — R911 Solitary pulmonary nodule: Secondary | ICD-10-CM

## 2022-01-04 DIAGNOSIS — J432 Centrilobular emphysema: Secondary | ICD-10-CM | POA: Diagnosis not present

## 2022-04-04 ENCOUNTER — Other Ambulatory Visit: Payer: Self-pay | Admitting: Surgery

## 2022-04-04 DIAGNOSIS — I712 Thoracic aortic aneurysm, without rupture, unspecified: Secondary | ICD-10-CM

## 2022-05-18 ENCOUNTER — Encounter: Payer: Self-pay | Admitting: Surgery

## 2022-05-18 ENCOUNTER — Ambulatory Visit
Admission: RE | Admit: 2022-05-18 | Discharge: 2022-05-18 | Disposition: A | Discharge status: Home / Self Care | Payer: Medicare HMO | Source: Ambulatory Visit | Attending: Surgery | Admitting: Surgery

## 2022-05-18 ENCOUNTER — Ambulatory Visit: Payer: Medicare HMO | Admitting: Surgery

## 2022-05-18 VITALS — BP 134/69 | HR 64 | Resp 20 | Ht 72.0 in | Wt 247.0 lb

## 2022-05-18 DIAGNOSIS — I517 Cardiomegaly: Secondary | ICD-10-CM | POA: Diagnosis not present

## 2022-05-18 DIAGNOSIS — I712 Thoracic aortic aneurysm, without rupture, unspecified: Secondary | ICD-10-CM | POA: Diagnosis not present

## 2022-05-18 DIAGNOSIS — J432 Centrilobular emphysema: Secondary | ICD-10-CM | POA: Diagnosis not present

## 2022-05-18 DIAGNOSIS — I700 Atherosclerosis of aorta: Secondary | ICD-10-CM | POA: Diagnosis not present

## 2022-05-18 MED ORDER — IOPAMIDOL (ISOVUE-370) INJECTION 76%
80.0000 mL | Freq: Once | INTRAVENOUS | Status: AC | PRN
  Administered 2022-05-18: 80 mL via INTRAVENOUS

## 2022-05-18 NOTE — Progress Notes (Signed)
    HPI: ***  Current Outpatient Medications  Medication Sig Dispense Refill   Cholecalciferol (VITAMIN D3) 3000 units TABS Take 3,000 Units by mouth daily.     levothyroxine (SYNTHROID) 125 MCG tablet Take 125 mcg by mouth daily before breakfast.     rosuvastatin (CRESTOR) 10 MG tablet Take 10 mg by mouth every other day.     No current facility-administered medications for this visit.     Physical Exam: ***  Diagnostic Tests: ***  Impression: ***  Plan: ***   Gaye Pollack, MD [facility] [phone]

## 2022-07-21 DIAGNOSIS — K5730 Diverticulosis of large intestine without perforation or abscess without bleeding: Secondary | ICD-10-CM | POA: Diagnosis not present

## 2022-07-21 DIAGNOSIS — Z86010 Personal history of colonic polyps: Secondary | ICD-10-CM | POA: Diagnosis not present

## 2022-07-21 DIAGNOSIS — Z980 Intestinal bypass and anastomosis status: Secondary | ICD-10-CM | POA: Diagnosis not present

## 2022-07-21 DIAGNOSIS — K649 Unspecified hemorrhoids: Secondary | ICD-10-CM | POA: Diagnosis not present

## 2022-07-21 DIAGNOSIS — Z85038 Personal history of other malignant neoplasm of large intestine: Secondary | ICD-10-CM | POA: Diagnosis not present

## 2022-08-16 NOTE — Progress Notes (Deleted)
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 Watt Climes -An infiltrative non-obstructing small mass was found in the mid sigmoid colon. Mass was partially circumferential (involving one third of the lumen circumference). The mass measured 2 cm in length, diameter was 2 mm. No bleeding was present.   -external and internal hemorrhoids -diverticulosis in the sigmoid colon, in the descending colon, in the transverse colon, and at the hepatic flexure -one medium polyp in the mid transverse colon  -ten small polyps in the rectum, in the sigmoid colon, in the descending colon, and in the transverse colon -likely malignant tumor in the mid sigmoid colon -exam was otherwise normal    02/26/2018 Initial Biopsy   From colonoscopy: 1. LG intestine- transverse colon, descending, polyp -Tubular adenomas (8). Sessile serrated adenoma/polyp 2. LG intestine - sigmoid colon bx -INVASIVE WELL DIFFERENTIATED ADENOCARCINOMA 3. LG intestine -sigmoid colon, rectum, polyp -Tubular adenoma with high grade dysplasia. Hypoplastic polyps (3).   02/26/2018 Initial Diagnosis   Colon cancer ([place])   02/28/2018 Imaging   CT AP IMPRESSION: -No evidence of metastatic disease or other acute findings. -Colonic diverticulosis, without radiographic evidence of diverticulitis. -Mild hepatic steatosis. -Mildly  enlarged prostate.     03/07/2018 Tumor Marker   CEA 1.3 (pre-op)   03/08/2018 Surgery   PROCEDURE:  Procedure(s): LAPAROSCOPIC ASSISTED SIGMOID COLECTOMY ERAS PATHWAY  MOBILIZATION OF SPLENIC FLEXURE RIGID SIGMOIDOSCOPY   03/08/2018 Cancer Staging   Staging form: Colon and Rectum, AJCC 8th Edition - Pathologic stage from 03/08/2018: Stage IIIB (pT3, pN1b, cM0) - Signed by Alla Feeling, NP on 04/05/2018   03/08/2018 Pathology Results   Diagnosis 1. Colon, segmental resection for tumor, sigmoid, stitch marks distal - INVASIVE ADENOCARCINOMA, MODERATELY DIFFERENTIATED, SPANNING 2.4 CM. - TUMOR FOCALLY INVADES THROUGH MUSCULARIS PROPRIA. - RESECTION MARGIN IS NEGATIVE. - METASTATIC CARCINOMA IN TWO OF TWENTY LYMPH NODES (2/20). - DIVERTICULOSIS. - HYPERPLASTIC POLYP (X2). - SEE ONCOLOGY TABLE. -MMR normal    04/09/2018 Imaging   CT Chest WO Contrast 04/09/18 IMPRESSION: 1. Stable exam. Scattered bilateral tiny pulmonary nodules without interval change. No new or progressive pulmonary nodule or mass. Attention on follow-up may be warranted. 2. Ascending thoracic aorta measures up to 4.9 cm maximum diameter, increased from 4.6 cm on prior study. Ascending thoracic aortic aneurysm. Recommend semi-annual imaging followup by CTA or MRA and referral to cardiothoracic surgery if not already obtained. This recommendation follows 2010 ACCF/AHA/AATS/ACR/ASA/SCA/SCAI/SIR/STS/SVM Guidelines for the Diagnosis and Management of Patients With Thoracic Aortic Disease. Circulation. 2010; 121: O130-Q657   04/13/2018 - 06/29/2018 Chemotherapy   Adjuvant CAPOX every 3 weeks. Oxaliplatin 130mg /m2, 2000 mg Xeloda BID on days 1-14, every 21 days starting 04/13/18. Dose reduced for first cycle. Last oxaliplatin treatment 06/15/18 at reduced dose due to neuropathy and fatigue, complete Xeloda on 06/29/18   06/04/2018 Genetic Testing   06/04/18 Testing did not reveal a pathogenic mutation in any of the genes  analyzed.  A copy of the genetic test report will be scanned into Epic under the Media tab.   The genes analyzed were the 83 genes on [facility]'s Multi-Cancer panel (ALK, APC, ATM, AXIN2, BAP1, BARD1, BLM, BMPR1A, BRCA1, BRCA2, BRIP1, CASR, CDC73, CDH1, CDK4, CDKN1B, CDKN1C, CDKN2A, CEBPA, CHEK2, CTNNA1, DICER1, DIS3L2, EGFR, EPCAM, FH, FLCN, GATA2, GPC3, GREM1, HOXB13, HRAS, KIT, MAX, MEN1, MET, MITF, MLH1, MSH2, MSH3, MSH6, MUTYH, NBN, NF1, NF2, NTHL1, PALB2, PDGFRA, PHOX2B, PMS2, POLD1, POLE, POT1, PRKAR1A, PTCH1, PTEN, RAD50, RAD51C, RAD51D, RB1, RECQL4, RET, RUNX1, SDHA, SDHAF2, SDHB, SDHC, SDHD, SMAD4, SMARCA4, SMARCB1, SMARCE1, STK11, SUFU, TERC, TERT, TMEM127, TP53, TSC1, TSC2, VHL, WRN, WT1).    01/23/2019 Imaging   CT AP W Contrast 01/23/19  IMPRESSION: Status post partial left hemicolectomy. No evidence of recurrent or metastatic disease.  Additional stable ancillary findings as above. Dedicated CTA chest reported separately.    CT Angio Chest 01/23/19  IMPRESSION: 1. Stable aneurysmal disease of the ascending thoracic aorta measuring 4.7 cm in greatest diameter. No dissection. 2. Stable mild cardiac enlargement. 3. Stable small bilateral pulmonary nodules demonstrating stability since January, 2018. Aortic aneurysm NOS (ICD10-I71.9).   02/20/2020 Imaging   CT Angio Chest  IMPRESSION: 1. Ascending thoracic aorta at 4.7 cm, stable. 2. Stable mild cardiac enlargement. 3. Increasing subpleural reticulation in both lungs since the previous study may represent developing fibrosis. But is nonspecific. Correlate with any recent infection and consider three-month follow-up with high-resolution chest CT. This could also be utilized to evaluate the small peripheral nodule seen in the left chest and the slightly enlarged nodule seen in the left upper lobe as described. 4. Nodular hepatic contours similar to prior exam.   Aortic Atherosclerosis (ICD10-I70.0).   03/03/2020 Imaging   CT  AP IMPRESSION: 1. No evidence of recurrent or metastatic carcinoma within the abdomen or pelvis. 2. Stable moderately enlarged prostate.  No acute findings.     03/18/2021 Imaging   CT CAP  IMPRESSION: 1. LEFT femoral fracture obliquely through the greater trochanter. Sclerosis of the fracture fragment compatible with chronicity, this has however occurred since previous imaging. Pathologic fracture is considered as well given pattern of the fracture and adjacent chondroid lesion though this does not appear to passed through the chondroid lesion. Correlate with any history of trauma and consider orthopedic consultation given greater trochanteric involvement and presence of underlying lesion. 2. No signs of metastatic disease in the chest, abdomen or pelvis. 3. Ascending thoracic aortic aneurysm as before. Recommend semi-annual imaging followup by CTA or MRA and referral to cardiothoracic surgery if not already obtained. This recommendation follows 2010 ACCF/AHA/AATS/ACR/ASA/SCA/SCAI/SIR/STS/SVM Guidelines for the Diagnosis and Management of Patients With Thoracic Aortic Disease. Circulation. 2010; 121: G295-M84. Aortic aneurysm NOS (ICD10-I71.9) 4. Prostate with heterogeneity and enlargement with similar appearance to prior imaging, nonspecific on CT. 5. Aortic atherosclerosis.   These results will be called to the ordering clinician or representative by the Radiologist Assistant, and communication documented in the PACS or [employer].   Aortic Atherosclerosis (ICD10-I70.0).     CURRENT THERAPY: Surveillance  INTERVAL HISTORY: Mr. Lott returns for follow-up, last seen by Dr. Burr Medico 08/19/2021   REVIEW OF SYSTEMS:   Constitutional: Denies fevers, chills or abnormal weight loss Eyes: Denies blurriness of vision Ears, nose, mouth, throat, and face: Denies mucositis or sore throat Respiratory: Denies cough, dyspnea or wheezes Cardiovascular: Denies palpitation, chest  discomfort or lower extremity swelling Gastrointestinal:  Denies nausea, heartburn or change in bowel habits Skin: Denies abnormal skin  rashes Lymphatics: Denies new lymphadenopathy or easy bruising Neurological:Denies numbness, tingling or new weaknesses Behavioral/Psych: Mood is stable, no new changes  All other systems were reviewed with the patient and are negative.  MEDICAL HISTORY:  Past Medical History:  Diagnosis Date   Arthritis    Blood clot in vein 01/2016   Colon cancer [facility])    Colon polyps    Degenerative joint disease    Genetic testing 06/11/18   Multi-Cancer panel (83 genes) @ [facility] - No pathogenic mutations detected   History of kidney stones age 52's   Hypertension    Hypothyroidism    Mass of colon    sigmoid   Stroke ([place]) 01/2016   speech difficulty for a few hours after knee replacement left   Thoracic ascending aortic aneurysm ([facility])    4.6-4.7 cm followed by Dr Servando Snare- LOV- 08/2017-epic    Wears dentures    Wears glasses     SURGICAL HISTORY: Past Surgical History:  Procedure Laterality Date   colonscopy  02/26/2018   KNEE ARTHROPLASTY Left 01/15/2016   Procedure: COMPUTER ASSISTED TOTAL KNEE ARTHROPLASTY;  Surgeon: [name], MD;  Location: [facility];  Service: Orthopedics;  Laterality: Left;   LAPAROSCOPIC SIGMOID COLECTOMY N/A 03/08/2018   Procedure: LAPAROSCOPIC ASSISTED SIGMOID COLECTOMY ERAS PATHWAY;  Surgeon: Jovita Kussmaul, MD;  Location[facility];  Service: General;  Laterality: N/A;   MULTIPLE TOOTH EXTRACTIONS     TOTAL KNEE ARTHROPLASTY Right 01/13/2017   Procedure: RIGHT TOTAL KNEE ARTHROPLASTY;  Surgeon: [name], MD;  Location: [place];  Service: Orthopedics;  Laterality: Right;    I have reviewed the social history and family history with the patient and they are unchanged from previous note.  ALLERGIES:  is allergic to quinolones and poison oak extract.  MEDICATIONS:  Current Outpatient Medications  Medication Sig Dispense  Refill   Cholecalciferol (VITAMIN D3) 3000 units TABS Take 3,000 Units by mouth daily.     levothyroxine (SYNTHROID) 125 MCG tablet Take 125 mcg by mouth daily before breakfast.     rosuvastatin (CRESTOR) 10 MG tablet Take 10 mg by mouth every other day.     No current facility-administered medications for this visit.    PHYSICAL EXAMINATION: ECOG PERFORMANCE STATUS: {CHL ONC ECOG PS:[phone]}  There were no vitals filed for this visit. There were no vitals filed for this visit.  GENERAL:alert, no distress and comfortable SKIN: skin color, texture, turgor are normal, no rashes or significant lesions EYES: normal, Conjunctiva are pink and non-injected, sclera clear OROPHARYNX:no exudate, no erythema and lips, buccal mucosa, and tongue normal  NECK: supple, thyroid normal size, non-tender, without nodularity LYMPH:  no palpable lymphadenopathy in the cervical, axillary or inguinal LUNGS: clear to auscultation and percussion with normal breathing effort HEART: regular rate & rhythm and no murmurs and no lower extremity edema ABDOMEN:abdomen soft, non-tender and normal bowel sounds Musculoskeletal:no cyanosis of digits and no clubbing  NEURO: alert & oriented x 3 with fluent speech, no focal motor/sensory deficits  LABORATORY DATA:  I have reviewed the data as listed    Latest Ref Rng & Units 08/19/2021    7:59 AM 03/18/2021    1:22 PM 11/17/2020    8:41 AM  CBC  WBC 4.0 - 10.5 K/uL 6.3  6.4  6.1   Hemoglobin 13.0 - 17.0 g/dL 16.1  15.4  15.7   Hematocrit 39.0 - 52.0 % 47.0  45.9  47.2   Platelets 150 - 400 K/uL 161  170  184         Latest Ref Rng & Units 08/19/2021    7:59 AM 03/18/2021    1:22 PM 11/17/2020    8:41 AM  CMP  Glucose 70 - 99 mg/dL 96  96  104   BUN 8 - 23 mg/dL _0 Creatinine 0.61 - 1.24 mg/dL 1.18  1.11  1.19   Sodium 135 - 145 mmol/L 140  140  141   Potassium 3.5 - 5.1 mmol/L 4.5  4.7  3.9   Chloride 98 - 111 mmol/L 109  107  110   CO2 22 -  32 mmol/L _1 Calcium 8.9 - 10.3 mg/dL 9.1  9.0  8.9   Total Protein 6.5 - 8.1 g/dL 7.2  7.1  7.1   Total Bilirubin 0.3 - 1.2 mg/dL 0.9  0.8  0.7   Alkaline Phos 38 - 126 U/L 93  102  108   AST 15 - 41 U/L _2 ALT 0 - 44 U/L _3 RADIOGRAPHIC STUDIES: I have personally reviewed the radiological images as listed and agreed with the findings in the report. No results found.   ASSESSMENT & PLAN:  No problem-specific Assessment & Plan notes found for this encounter.   No orders of the defined types were placed in this encounter.  All questions were answered. The patient knows to call the clinic with any problems, questions or concerns. No barriers to learning was detected. I spent {CHL ONC TIME VISIT - PZXAQ:[id number]} counseling the patient face to face. The total time spent in the appointment was {CHL ONC TIME VISIT - GXEXP:[id number]} and more than 50% was on counseling and review of test results     Alla Feeling, NP 08/16/22

## 2022-08-18 ENCOUNTER — Other Ambulatory Visit: Payer: Medicare HMO

## 2022-08-18 ENCOUNTER — Ambulatory Visit: Payer: Medicare HMO | Admitting: Nurse Practitioner

## 2022-09-21 DIAGNOSIS — J1189 Influenza due to unidentified influenza virus with other manifestations: Secondary | ICD-10-CM | POA: Diagnosis not present

## 2022-09-26 NOTE — Progress Notes (Signed)
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 Watt Climes -An infiltrative non-obstructing small mass was found in the mid sigmoid colon. Mass was partially circumferential (involving one third of the lumen circumference). The mass measured 2 cm in length, diameter was 2 mm. No bleeding was present.   -external and internal hemorrhoids -diverticulosis in the sigmoid colon, in the descending colon, in the transverse colon, and at the hepatic flexure -one medium polyp in the mid transverse colon  -ten small polyps in the rectum, in the sigmoid colon, in the descending colon, and in the transverse colon -likely malignant tumor in the mid sigmoid colon -exam was otherwise normal    02/26/2018 Initial Biopsy   From colonoscopy: 1. LG intestine- transverse colon, descending, polyp -Tubular adenomas (8). Sessile serrated adenoma/polyp 2. LG intestine - sigmoid colon bx -INVASIVE WELL DIFFERENTIATED ADENOCARCINOMA 3. LG intestine -sigmoid colon, rectum, polyp -Tubular adenoma with high grade dysplasia. Hypoplastic polyps (3).   02/26/2018 Initial Diagnosis   Colon cancer ([place])   02/28/2018 Imaging   CT AP IMPRESSION: -No evidence of metastatic disease or other acute findings. -Colonic diverticulosis, without radiographic evidence of diverticulitis. -Mild hepatic steatosis. -Mildly  enlarged prostate.     03/07/2018 Tumor Marker   CEA 1.3 (pre-op)   03/08/2018 Surgery   PROCEDURE:  Procedure(s): LAPAROSCOPIC ASSISTED SIGMOID COLECTOMY ERAS PATHWAY  MOBILIZATION OF SPLENIC FLEXURE RIGID SIGMOIDOSCOPY   03/08/2018 Cancer Staging   Staging form: Colon and Rectum, AJCC 8th Edition - Pathologic stage from 03/08/2018: Stage IIIB (pT3, pN1b, cM0) - Signed by Alla Feeling, NP on 04/05/2018   03/08/2018 Pathology Results   Diagnosis 1. Colon, segmental resection for tumor, sigmoid, stitch marks distal - INVASIVE ADENOCARCINOMA, MODERATELY DIFFERENTIATED, SPANNING 2.4 CM. - TUMOR FOCALLY INVADES THROUGH MUSCULARIS PROPRIA. - RESECTION MARGIN IS NEGATIVE. - METASTATIC CARCINOMA IN TWO OF TWENTY LYMPH NODES (2/20). - DIVERTICULOSIS. - HYPERPLASTIC POLYP (X2). - SEE ONCOLOGY TABLE. -MMR normal    04/09/2018 Imaging   CT Chest WO Contrast 04/09/18 IMPRESSION: 1. Stable exam. Scattered bilateral tiny pulmonary nodules without interval change. No new or progressive pulmonary nodule or mass. Attention on follow-up may be warranted. 2. Ascending thoracic aorta measures up to 4.9 cm maximum diameter, increased from 4.6 cm on prior study. Ascending thoracic aortic aneurysm. Recommend semi-annual imaging followup by CTA or MRA and referral to cardiothoracic surgery if not already obtained. This recommendation follows 2010 ACCF/AHA/AATS/ACR/ASA/SCA/SCAI/SIR/STS/SVM Guidelines for the Diagnosis and Management of Patients With Thoracic Aortic Disease. Circulation. 2010; 121: Y706-C376   04/13/2018 - 06/29/2018 Chemotherapy   Adjuvant CAPOX every 3 weeks. Oxaliplatin 176m/m2, 2000 mg Xeloda BID on days 1-14, every 21 days starting 04/13/18. Dose reduced for first cycle. Last oxaliplatin treatment 06/15/18 at reduced dose due to neuropathy and fatigue, complete Xeloda on 06/29/18   06/04/2018 Genetic Testing   06/04/18 Testing did not reveal a pathogenic mutation in any of  the genes  analyzed.  A copy of the genetic test report will be scanned into Epic under the Media tab.   The genes analyzed were the 83 genes on [facility]'s Multi-Cancer panel (ALK, APC, ATM, AXIN2, BAP1, BARD1, BLM, BMPR1A, BRCA1, BRCA2, BRIP1, CASR, CDC73, CDH1, CDK4, CDKN1B, CDKN1C, CDKN2A, CEBPA, CHEK2, CTNNA1, DICER1, DIS3L2, EGFR, EPCAM, FH, FLCN, GATA2, GPC3, GREM1, HOXB13, HRAS, KIT, MAX, MEN1, MET, MITF, MLH1, MSH2, MSH3, MSH6, MUTYH, NBN, NF1, NF2, NTHL1, PALB2, PDGFRA, PHOX2B, PMS2, POLD1, POLE, POT1, PRKAR1A, PTCH1, PTEN, RAD50, RAD51C, RAD51D, RB1, RECQL4, RET, RUNX1, SDHA, SDHAF2, SDHB, SDHC, SDHD, SMAD4, SMARCA4, SMARCB1, SMARCE1, STK11, SUFU, TERC, TERT, TMEM127, TP53, TSC1, TSC2, VHL, WRN, WT1).    01/23/2019 Imaging   CT AP W Contrast 01/23/19  IMPRESSION: Status post partial left hemicolectomy. No evidence of recurrent or metastatic disease.  Additional stable ancillary findings as above. Dedicated CTA chest reported separately.    CT Angio Chest 01/23/19  IMPRESSION: 1. Stable aneurysmal disease of the ascending thoracic aorta measuring 4.7 cm in greatest diameter. No dissection. 2. Stable mild cardiac enlargement. 3. Stable small bilateral pulmonary nodules demonstrating stability since January, 2018. Aortic aneurysm NOS (ICD10-I71.9).   02/20/2020 Imaging   CT Angio Chest  IMPRESSION: 1. Ascending thoracic aorta at 4.7 cm, stable. 2. Stable mild cardiac enlargement. 3. Increasing subpleural reticulation in both lungs since the previous study may represent developing fibrosis. But is nonspecific. Correlate with any recent infection and consider three-month follow-up with high-resolution chest CT. This could also be utilized to evaluate the small peripheral nodule seen in the left chest and the slightly enlarged nodule seen in the left upper lobe as described. 4. Nodular hepatic contours similar to prior exam.   Aortic Atherosclerosis (ICD10-I70.0).   03/03/2020 Imaging   CT  AP IMPRESSION: 1. No evidence of recurrent or metastatic carcinoma within the abdomen or pelvis. 2. Stable moderately enlarged prostate.  No acute findings.     03/18/2021 Imaging   CT CAP  IMPRESSION: 1. LEFT femoral fracture obliquely through the greater trochanter. Sclerosis of the fracture fragment compatible with chronicity, this has however occurred since previous imaging. Pathologic fracture is considered as well given pattern of the fracture and adjacent chondroid lesion though this does not appear to passed through the chondroid lesion. Correlate with any history of trauma and consider orthopedic consultation given greater trochanteric involvement and presence of underlying lesion. 2. No signs of metastatic disease in the chest, abdomen or pelvis. 3. Ascending thoracic aortic aneurysm as before. Recommend semi-annual imaging followup by CTA or MRA and referral to cardiothoracic surgery if not already obtained. This recommendation follows 2010 ACCF/AHA/AATS/ACR/ASA/SCA/SCAI/SIR/STS/SVM Guidelines for the Diagnosis and Management of Patients With Thoracic Aortic Disease. Circulation. 2010; 121: H962-I29. Aortic aneurysm NOS (ICD10-I71.9) 4. Prostate with heterogeneity and enlargement with similar appearance to prior imaging, nonspecific on CT. 5. Aortic atherosclerosis.   These results will be called to the ordering clinician or representative by the Radiologist Assistant, and communication documented in the PACS or [employer].   Aortic Atherosclerosis (ICD10-I70.0).     CURRENT THERAPY: Surveillance   INTERVAL HISTORY: Christian Weeks returns for follow up as scheduled, last seen by Dr. Burr Medico 08/19/22. He continues surveillance, doing well in general without changes in his health.  Reportedly had a normal colonoscopy this summer with Dr. Watt Climes.  Wellness visit with PCP is coming up. He attributes mild weight loss to being busy, working part-time, and doing yard work.   Bowels are normal for the most part.  Denies black/bloody stools, abdominal pain or bloating, decreased appetite, or any other new specific complaints.  All other systems were reviewed with the patient and are negative.  MEDICAL HISTORY:  Past Medical History:  Diagnosis Date   Arthritis    Blood clot in vein 01/2016   Colon cancer [facility])    Colon polyps    Degenerative joint disease    Genetic testing 06/11/18   Multi-Cancer panel (83 genes) @ [facility] - No pathogenic mutations detected   History of kidney stones age 32's   Hypertension    Hypothyroidism    Mass of colon    sigmoid   Stroke ([place]) 01/2016   speech difficulty for a few hours after knee replacement left   Thoracic ascending aortic aneurysm ([facility])    4.6-4.7 cm followed by Dr Servando Snare- LOV- 08/2017-epic    Wears dentures    Wears glasses     SURGICAL HISTORY: Past Surgical History:  Procedure Laterality Date   colonscopy  02/26/2018   KNEE ARTHROPLASTY Left 01/15/2016   Procedure: COMPUTER ASSISTED TOTAL KNEE ARTHROPLASTY;  Surgeon: [name], MD;  Location: [facility];  Service: Orthopedics;  Laterality: Left;   LAPAROSCOPIC SIGMOID COLECTOMY N/A 03/08/2018   Procedure: LAPAROSCOPIC ASSISTED SIGMOID COLECTOMY ERAS PATHWAY;  Surgeon: Jovita Kussmaul, MD;  Location: [facility];  Service: General;  Laterality: N/A;   MULTIPLE TOOTH EXTRACTIONS     TOTAL KNEE ARTHROPLASTY Right 01/13/2017   Procedure: RIGHT TOTAL KNEE ARTHROPLASTY;  Surgeon: [name], MD;  Location: [facility];  Service: Orthopedics;  Laterality: Right;    I have reviewed the social history and family history with the patient and they are unchanged from previous note.  ALLERGIES:  is allergic to quinolones and poison oak extract.  MEDICATIONS:  Current Outpatient Medications  Medication Sig Dispense Refill   Cholecalciferol (VITAMIN D3) 3000 units TABS Take 3,000 Units by mouth daily.     levothyroxine (SYNTHROID) 125 MCG tablet Take 125 mcg by mouth daily  before breakfast.     rosuvastatin (CRESTOR) 10 MG tablet Take 10 mg by mouth every other day.     No current facility-administered medications for this visit.    PHYSICAL EXAMINATION: ECOG PERFORMANCE STATUS: 0 - Asymptomatic  Vitals:   09/27/22 1036  BP: 118/81  Pulse: 62  Resp: 18  Temp: 97.9 F (36.6 C)  SpO2: 96%   Filed Weights   09/27/22 1036  Weight: 243 lb 1.6 oz (110.3 kg)    GENERAL:alert, no distress and comfortable SKIN: No rash EYES: sclera clear NECK: Without mass LUNGS: clear with normal breathing effort HEART: regular rate & rhythm, no lower extremity edema ABDOMEN:abdomen soft, non-tender and normal bowel sounds NEURO: alert & oriented x 3 with fluent speech, no focal motor/sensory deficits  LABORATORY DATA:  I have reviewed the data as listed    Latest Ref Rng & Units 09/27/2022   10:16 AM 08/19/2021    7:59 AM 03/18/2021    1:22 PM  CBC  WBC 4.0 - 10.5 K/uL 8.8  6.3  6.4   Hemoglobin 13.0 - 17.0 g/dL 16.1  16.1  15.4   Hematocrit 39.0 - 52.0 % 45.9  47.0  45.9   Platelets 150 - 400 K/uL 177  161  170         Latest Ref Rng & Units 09/27/2022   10:16 AM 08/19/2021    7:59 AM 03/18/2021    1:22 PM  CMP  Glucose 70 - 99 mg/dL  87  96  96   BUN 8 - 23 mg/dL '16  13  11   ' Creatinine 0.61 - 1.24 mg/dL 1.19  1.18  1.11   Sodium 135 - 145 mmol/L 142  140  140   Potassium 3.5 - 5.1 mmol/L 4.5  4.5  4.7   Chloride 98 - 111 mmol/L 108  109  107   CO2 22 - 32 mmol/L '29  24  24   ' Calcium 8.9 - 10.3 mg/dL 9.6  9.1  9.0   Total Protein 6.5 - 8.1 g/dL 6.8  7.2  7.1   Total Bilirubin 0.3 - 1.2 mg/dL 0.9  0.9  0.8   Alkaline Phos 38 - 126 U/L 83  93  102   AST 15 - 41 U/L '19  20  19   ' ALT 0 - 44 U/L '16  18  17       ' RADIOGRAPHIC STUDIES: I have personally reviewed the radiological images as listed and agreed with the findings in the report. No results found.   ASSESSMENT & PLAN: Christian Weeks is a 71 y.o. male with      1. Sigmoid colon  adenocarcinoma, well differentiated, pT3N1b,M0, stage IIIB, MMR-normal -Diagnosed in 02/2018. S/p sigmoid hemicolectomy and adjuvant chemo with CAPOX.  -He has recovered very well from chemotherapy except mild residual intermittent tingling finger tips, no functional deficits -His 06/2019 colonoscopy showed hemorrhoids, sigmoid diverticulosis and adenoma and hyperplastic polyps.  We will request his most recent colonoscopy done this summer at [facility] -Surveillance imaging through 02/2021 showed no evidence of recurrent or metastatic disease -Mr. Matherne is clinically doing well, exam is benign, CBC is normal.  Overall no concern for colon cancer recurrence. -He is over 4 years from initial diagnosis, the recurrence risk has significantly decreased.  We will complete 5 years of surveillance -Continue healthy active lifestyle, no smoking or alcohol, and age-appropriate health maintenance -Follow-up in 6 months for last visit, or sooner if needed.  We reviewed signs and symptoms of recurrence   2. Genetic Testing was negative    3. HTN, thoracic aneurysm -4.8 cm ascending thoracic aortic aneurysm followed by Dr. Servando Snare  -CT chest on 08/27/2020 also showed left upper lobe nodule that was reduced in size from 01/2020 but not present on prior CT's -Stable on last CT a 05/18/2022   4. 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  Completed anticoagulation with Eliquis from 01/2016 - 08/2017.  - Followed by PCP and neuro.   -No signs of recurrent thrombosis   5. COVID19 positive in 10/2019 -He received both his COVID19 vaccines.   Plan: -Labs reviewed, we will follow-up the pending CEA from today -Request recent colonoscopy from [facility] -Follow-up in 6 months for final surveillance visit   All questions were answered. The patient knows to call the clinic with any problems, questions or concerns. No barriers to learning were detected.      Alla Feeling, NP 09/27/22

## 2022-09-27 ENCOUNTER — Inpatient Hospital Stay (HOSPITAL_BASED_OUTPATIENT_CLINIC_OR_DEPARTMENT_OTHER): Payer: Medicare HMO | Admitting: Nurse Practitioner

## 2022-09-27 ENCOUNTER — Other Ambulatory Visit: Payer: Self-pay

## 2022-09-27 ENCOUNTER — Encounter: Payer: Self-pay | Admitting: Nurse Practitioner

## 2022-09-27 ENCOUNTER — Telehealth: Payer: Self-pay | Admitting: *Deleted

## 2022-09-27 ENCOUNTER — Inpatient Hospital Stay: Payer: Medicare HMO | Attending: Nurse Practitioner

## 2022-09-27 VITALS — BP 118/81 | HR 62 | Temp 97.9°F | Resp 18 | Ht 72.0 in | Wt 243.1 lb

## 2022-09-27 DIAGNOSIS — Z9221 Personal history of antineoplastic chemotherapy: Secondary | ICD-10-CM | POA: Diagnosis not present

## 2022-09-27 DIAGNOSIS — Z85038 Personal history of other malignant neoplasm of large intestine: Secondary | ICD-10-CM | POA: Diagnosis not present

## 2022-09-27 DIAGNOSIS — Z8673 Personal history of transient ischemic attack (TIA), and cerebral infarction without residual deficits: Secondary | ICD-10-CM | POA: Diagnosis not present

## 2022-09-27 DIAGNOSIS — Z86718 Personal history of other venous thrombosis and embolism: Secondary | ICD-10-CM | POA: Diagnosis not present

## 2022-09-27 DIAGNOSIS — C187 Malignant neoplasm of sigmoid colon: Secondary | ICD-10-CM | POA: Diagnosis not present

## 2022-09-27 DIAGNOSIS — Z79899 Other long term (current) drug therapy: Secondary | ICD-10-CM | POA: Insufficient documentation

## 2022-09-27 DIAGNOSIS — Z96653 Presence of artificial knee joint, bilateral: Secondary | ICD-10-CM | POA: Insufficient documentation

## 2022-09-27 DIAGNOSIS — I10 Essential (primary) hypertension: Secondary | ICD-10-CM | POA: Insufficient documentation

## 2022-09-27 DIAGNOSIS — I712 Thoracic aortic aneurysm, without rupture, unspecified: Secondary | ICD-10-CM | POA: Diagnosis not present

## 2022-09-27 DIAGNOSIS — Z9049 Acquired absence of other specified parts of digestive tract: Secondary | ICD-10-CM | POA: Diagnosis not present

## 2022-09-27 DIAGNOSIS — Z7901 Long term (current) use of anticoagulants: Secondary | ICD-10-CM | POA: Diagnosis not present

## 2022-09-27 DIAGNOSIS — C772 Secondary and unspecified malignant neoplasm of intra-abdominal lymph nodes: Secondary | ICD-10-CM

## 2022-09-27 LAB — CBC WITH DIFFERENTIAL (CANCER CENTER ONLY)
Abs Immature Granulocytes: 0.03 10*3/uL (ref 0.00–0.07)
Basophils Absolute: 0.1 10*3/uL (ref 0.0–0.1)
Basophils Relative: 1 %
Eosinophils Absolute: 0.4 10*3/uL (ref 0.0–0.5)
Eosinophils Relative: 5 %
HCT: 45.9 % (ref 39.0–52.0)
Hemoglobin: 16.1 g/dL (ref 13.0–17.0)
Immature Granulocytes: 0 %
Lymphocytes Relative: 20 %
Lymphs Abs: 1.7 10*3/uL (ref 0.7–4.0)
MCH: 31.5 pg (ref 26.0–34.0)
MCHC: 35.1 g/dL (ref 30.0–36.0)
MCV: 89.8 fL (ref 80.0–100.0)
Monocytes Absolute: 1 10*3/uL (ref 0.1–1.0)
Monocytes Relative: 11 %
Neutro Abs: 5.6 10*3/uL (ref 1.7–7.7)
Neutrophils Relative %: 63 %
Platelet Count: 177 10*3/uL (ref 150–400)
RBC: 5.11 MIL/uL (ref 4.22–5.81)
RDW: 13.1 % (ref 11.5–15.5)
WBC Count: 8.8 10*3/uL (ref 4.0–10.5)
nRBC: 0 % (ref 0.0–0.2)

## 2022-09-27 LAB — CEA (IN HOUSE-CHCC): CEA (CHCC-In House): 1.46 ng/mL (ref 0.00–5.00)

## 2022-09-27 LAB — CMP (CANCER CENTER ONLY)
ALT: 16 U/L (ref 0–44)
AST: 19 U/L (ref 15–41)
Albumin: 4.3 g/dL (ref 3.5–5.0)
Alkaline Phosphatase: 83 U/L (ref 38–126)
Anion gap: 5 (ref 5–15)
BUN: 16 mg/dL (ref 8–23)
CO2: 29 mmol/L (ref 22–32)
Calcium: 9.6 mg/dL (ref 8.9–10.3)
Chloride: 108 mmol/L (ref 98–111)
Creatinine: 1.19 mg/dL (ref 0.61–1.24)
GFR, Estimated: >60 mL/min (ref >60)
Glucose, Bld: 87 mg/dL (ref 70–99)
Potassium: 4.5 mmol/L (ref 3.5–5.1)
Sodium: 142 mmol/L (ref 135–145)
Total Bilirubin: 0.9 mg/dL (ref 0.3–1.2)
Total Protein: 6.8 g/dL (ref 6.5–8.1)

## 2022-09-27 NOTE — Telephone Encounter (Signed)
Per Cira Rue, NP, request made to [facility]/ [facility] for copy of recent colonoscopy.  Left message with fax number and return phone number.

## 2022-09-28 ENCOUNTER — Encounter: Payer: Self-pay | Admitting: Hematology

## 2022-10-20 DIAGNOSIS — I10 Essential (primary) hypertension: Secondary | ICD-10-CM | POA: Diagnosis not present

## 2022-10-20 DIAGNOSIS — Z125 Encounter for screening for malignant neoplasm of prostate: Secondary | ICD-10-CM | POA: Diagnosis not present

## 2022-10-20 DIAGNOSIS — E039 Hypothyroidism, unspecified: Secondary | ICD-10-CM | POA: Diagnosis not present

## 2022-10-25 DIAGNOSIS — K7689 Other specified diseases of liver: Secondary | ICD-10-CM | POA: Diagnosis not present

## 2022-10-25 DIAGNOSIS — Z0000 Encounter for general adult medical examination without abnormal findings: Secondary | ICD-10-CM | POA: Diagnosis not present

## 2022-10-25 DIAGNOSIS — M8580 Other specified disorders of bone density and structure, unspecified site: Secondary | ICD-10-CM | POA: Diagnosis not present

## 2022-10-25 DIAGNOSIS — E032 Hypothyroidism due to medicaments and other exogenous substances: Secondary | ICD-10-CM | POA: Diagnosis not present

## 2022-10-25 DIAGNOSIS — I10 Essential (primary) hypertension: Secondary | ICD-10-CM | POA: Diagnosis not present

## 2022-10-25 DIAGNOSIS — I712 Thoracic aortic aneurysm, without rupture, unspecified: Secondary | ICD-10-CM | POA: Diagnosis not present

## 2022-10-25 DIAGNOSIS — I6529 Occlusion and stenosis of unspecified carotid artery: Secondary | ICD-10-CM | POA: Diagnosis not present

## 2022-10-25 DIAGNOSIS — Z8673 Personal history of transient ischemic attack (TIA), and cerebral infarction without residual deficits: Secondary | ICD-10-CM | POA: Diagnosis not present

## 2022-10-25 DIAGNOSIS — M810 Age-related osteoporosis without current pathological fracture: Secondary | ICD-10-CM | POA: Diagnosis not present

## 2022-10-25 DIAGNOSIS — C4492 Squamous cell carcinoma of skin, unspecified: Secondary | ICD-10-CM | POA: Diagnosis not present

## 2022-10-25 DIAGNOSIS — Q2112 Patent foramen ovale: Secondary | ICD-10-CM | POA: Diagnosis not present

## 2022-11-16 ENCOUNTER — Encounter: Payer: Self-pay | Admitting: Nurse Practitioner

## 2022-11-16 NOTE — Progress Notes (Signed)
Received and reviewed colonoscopy 07/21/22 by Dr. Watt Climes which showed hemorrhoids, diverticulosis, and normal appearing anastomosis. No biopsies were taken. 3-5 year recall.   Cira Rue, NP

## 2022-11-21 DIAGNOSIS — I8311 Varicose veins of right lower extremity with inflammation: Secondary | ICD-10-CM | POA: Diagnosis not present

## 2022-11-21 DIAGNOSIS — I8312 Varicose veins of left lower extremity with inflammation: Secondary | ICD-10-CM | POA: Diagnosis not present

## 2022-11-21 DIAGNOSIS — B957 Other staphylococcus as the cause of diseases classified elsewhere: Secondary | ICD-10-CM | POA: Diagnosis not present

## 2022-12-01 DIAGNOSIS — E669 Obesity, unspecified: Secondary | ICD-10-CM | POA: Diagnosis not present

## 2022-12-01 DIAGNOSIS — L2089 Other atopic dermatitis: Secondary | ICD-10-CM | POA: Diagnosis not present

## 2022-12-01 DIAGNOSIS — L300 Nummular dermatitis: Secondary | ICD-10-CM | POA: Diagnosis not present

## 2022-12-01 DIAGNOSIS — L282 Other prurigo: Secondary | ICD-10-CM | POA: Diagnosis not present

## 2023-01-03 DIAGNOSIS — L2089 Other atopic dermatitis: Secondary | ICD-10-CM | POA: Diagnosis not present

## 2023-01-24 DIAGNOSIS — E039 Hypothyroidism, unspecified: Secondary | ICD-10-CM | POA: Diagnosis not present

## 2023-01-31 DIAGNOSIS — E039 Hypothyroidism, unspecified: Secondary | ICD-10-CM | POA: Diagnosis not present

## 2023-01-31 DIAGNOSIS — E785 Hyperlipidemia, unspecified: Secondary | ICD-10-CM | POA: Diagnosis not present

## 2023-01-31 DIAGNOSIS — M810 Age-related osteoporosis without current pathological fracture: Secondary | ICD-10-CM | POA: Diagnosis not present

## 2023-03-14 DIAGNOSIS — M810 Age-related osteoporosis without current pathological fracture: Secondary | ICD-10-CM | POA: Diagnosis not present

## 2023-03-14 DIAGNOSIS — E039 Hypothyroidism, unspecified: Secondary | ICD-10-CM | POA: Diagnosis not present

## 2023-03-14 DIAGNOSIS — E785 Hyperlipidemia, unspecified: Secondary | ICD-10-CM | POA: Diagnosis not present

## 2023-03-28 ENCOUNTER — Telehealth: Payer: Self-pay | Admitting: Hematology

## 2023-03-28 NOTE — Telephone Encounter (Signed)
Patient called to reschedule 4/4 appointment due to work conflict. Patient rescheduled and notified.

## 2023-03-30 ENCOUNTER — Inpatient Hospital Stay: Payer: Medicare HMO | Admitting: Hematology

## 2023-03-30 ENCOUNTER — Inpatient Hospital Stay: Payer: Medicare HMO

## 2023-04-06 ENCOUNTER — Other Ambulatory Visit: Payer: Self-pay | Admitting: Surgery

## 2023-04-06 DIAGNOSIS — I7121 Aneurysm of the ascending aorta, without rupture: Secondary | ICD-10-CM

## 2023-04-11 NOTE — Progress Notes (Unsigned)
[facility]   Telephone:(336) [phone] Fax:(336) [phone]   Clinic Follow up Note   Patient Care Team: Merri Brunette, MD as PCP - General (Internal Medicine) Regan Lemming, MD as PCP - Cardiology (Cardiology)  Date of Service:  04/13/2023  CHIEF COMPLAINT: f/u of sigmoid colon cancer   CURRENT THERAPY:  Surveillance   ASSESSMENT:  Christian Weeks is a 72 y.o. 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 S/p sigmoid hemicolectomy and adjuvant chemo with CAPOX.  -last colonoscopy in 06/2022 was unremarkable  -he is clinically doing well.  Lab reviewed, exam was unremarkable, there is no clinical concern for recurrence -He has completed 5 years of cancer surveillance, his risk of recurrence is minimal now.  I will see him as needed.   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 Ewing Schlein -An infiltrative non-obstructing small mass was found in the mid sigmoid colon. Mass was partially circumferential (involving one third of the lumen circumference). The mass measured 2 cm in length, diameter was 2 mm. No bleeding was present.   -external and internal hemorrhoids -diverticulosis in the sigmoid colon, in the descending colon, in the transverse colon, and at the hepatic flexure -one medium polyp in the mid transverse colon  -ten small polyps in the rectum, in the sigmoid colon, in the descending colon, and in the transverse colon -likely malignant tumor in the mid sigmoid colon -exam  was otherwise normal    02/26/2018 Initial Biopsy   From colonoscopy: 1. LG intestine- transverse colon, descending, polyp -Tubular adenomas (8). Sessile serrated adenoma/polyp 2. LG intestine - sigmoid colon bx -INVASIVE WELL DIFFERENTIATED ADENOCARCINOMA 3. LG intestine -sigmoid colon, rectum, polyp -Tubular adenoma with high grade dysplasia. Hypoplastic polyps (3).   02/26/2018 Initial Diagnosis   Colon cancer (HCC)   02/28/2018 Imaging   CT AP IMPRESSION: -No evidence of metastatic disease or other acute findings. -Colonic diverticulosis, without radiographic evidence of diverticulitis. -Mild hepatic steatosis. -Mildly enlarged prostate.     03/07/2018 Tumor Marker   CEA 1.3 (pre-op)   03/08/2018 Surgery   PROCEDURE:  Procedure(s): LAPAROSCOPIC ASSISTED SIGMOID COLECTOMY ERAS PATHWAY  MOBILIZATION OF SPLENIC FLEXURE RIGID SIGMOIDOSCOPY   03/08/2018 Cancer Staging   Staging form: Colon and Rectum, AJCC 8th Edition - Pathologic stage from 03/08/2018: Stage IIIB (pT3, pN1b, cM0) - Signed by Pollyann Samples, NP on 04/05/2018   03/08/2018 Pathology Results   Diagnosis 1. Colon, segmental resection for tumor, sigmoid, stitch marks distal - INVASIVE ADENOCARCINOMA, MODERATELY DIFFERENTIATED, SPANNING 2.4 CM. - TUMOR FOCALLY INVADES THROUGH MUSCULARIS PROPRIA. - RESECTION MARGIN IS NEGATIVE. - METASTATIC CARCINOMA IN TWO OF TWENTY LYMPH NODES (2/20). - DIVERTICULOSIS. - HYPERPLASTIC POLYP (X2). - SEE ONCOLOGY TABLE. -MMR normal    04/09/2018 Imaging   CT Chest WO Contrast 04/09/18 IMPRESSION: 1. Stable exam. Scattered bilateral tiny pulmonary nodules without interval change. No new or progressive pulmonary nodule or mass. Attention on follow-up may be warranted. 2. Ascending thoracic aorta measures up to 4.9 cm maximum diameter, increased from  4.6 cm on prior study. Ascending thoracic aortic aneurysm. Recommend semi-annual imaging followup by CTA or MRA and referral to  cardiothoracic surgery if not already obtained. This recommendation follows 2010 ACCF/AHA/AATS/ACR/ASA/SCA/SCAI/SIR/STS/SVM Guidelines for the Diagnosis and Management of Patients With Thoracic Aortic Disease. Circulation. 2010; 121: W098-J191   04/13/2018 - 06/29/2018 Chemotherapy   Adjuvant CAPOX every 3 weeks. Oxaliplatin 130mg /m2, 2000 mg Xeloda BID on days 1-14, every 21 days starting 04/13/18. Dose reduced for first cycle. Last oxaliplatin treatment 06/15/18 at reduced dose due to neuropathy and fatigue, complete Xeloda on 06/29/18   06/04/2018 Genetic Testing   06/04/18 Testing did not reveal a pathogenic mutation in any of the genes analyzed.  A copy of the genetic test report will be scanned into Epic under the Media tab.   The genes analyzed were the 83 genes on [facility]'s Multi-Cancer panel (ALK, APC, ATM, AXIN2, BAP1, BARD1, BLM, BMPR1A, BRCA1, BRCA2, BRIP1, CASR, CDC73, CDH1, CDK4, CDKN1B, CDKN1C, CDKN2A, CEBPA, CHEK2, CTNNA1, DICER1, DIS3L2, EGFR, EPCAM, FH, FLCN, GATA2, GPC3, GREM1, HOXB13, HRAS, KIT, MAX, MEN1, MET, MITF, MLH1, MSH2, MSH3, MSH6, MUTYH, NBN, NF1, NF2, NTHL1, PALB2, PDGFRA, PHOX2B, PMS2, POLD1, POLE, POT1, PRKAR1A, PTCH1, PTEN, RAD50, RAD51C, RAD51D, RB1, RECQL4, RET, RUNX1, SDHA, SDHAF2, SDHB, SDHC, SDHD, SMAD4, SMARCA4, SMARCB1, SMARCE1, STK11, SUFU, TERC, TERT, TMEM127, TP53, TSC1, TSC2, VHL, WRN, WT1).    01/23/2019 Imaging   CT AP W Contrast 01/23/19  IMPRESSION: Status post partial left hemicolectomy. No evidence of recurrent or metastatic disease.  Additional stable ancillary findings as above. Dedicated CTA chest reported separately.    CT Angio Chest 01/23/19  IMPRESSION: 1. Stable aneurysmal disease of the ascending thoracic aorta measuring 4.7 cm in greatest diameter. No dissection. 2. Stable mild cardiac enlargement. 3. Stable small bilateral pulmonary nodules demonstrating stability since January, 2018. Aortic aneurysm NOS (ICD10-I71.9).   02/20/2020  Imaging   CT Angio Chest  IMPRESSION: 1. Ascending thoracic aorta at 4.7 cm, stable. 2. Stable mild cardiac enlargement. 3. Increasing subpleural reticulation in both lungs since the previous study may represent developing fibrosis. But is nonspecific. Correlate with any recent infection and consider three-month follow-up with high-resolution chest CT. This could also be utilized to evaluate the small peripheral nodule seen in the left chest and the slightly enlarged nodule seen in the left upper lobe as described. 4. Nodular hepatic contours similar to prior exam.   Aortic Atherosclerosis (ICD10-I70.0).   03/03/2020 Imaging   CT AP IMPRESSION: 1. No evidence of recurrent or metastatic carcinoma within the abdomen or pelvis. 2. Stable moderately enlarged prostate.  No acute findings.     03/18/2021 Imaging   CT CAP  IMPRESSION: 1. LEFT femoral fracture obliquely through the greater trochanter. Sclerosis of the fracture fragment compatible with chronicity, this has however occurred since previous imaging. Pathologic fracture is considered as well given pattern of the fracture and adjacent chondroid lesion though this does not appear to passed through the chondroid lesion. Correlate with any history of trauma and consider orthopedic consultation given greater trochanteric involvement and presence of underlying lesion. 2. No signs of metastatic disease in the chest, abdomen or pelvis. 3. Ascending thoracic aortic aneurysm as before. Recommend semi-annual imaging followup by CTA or MRA and referral to cardiothoracic surgery if not already obtained. This recommendation follows 2010 ACCF/AHA/AATS/ACR/ASA/SCA/SCAI/SIR/STS/SVM Guidelines for the Diagnosis and Management of Patients With Thoracic Aortic Disease. Circulation. 2010; 121: Y782-N56. Aortic aneurysm NOS (ICD10-I71.9) 4. Prostate with heterogeneity and enlargement with similar appearance to prior imaging, nonspecific on  CT. 5. Aortic atherosclerosis.   These results will be called to the ordering clinician or representative by the Radiologist Assistant, and communication documented in the PACS or [employer].   Aortic Atherosclerosis (ICD10-I70.0).      INTERVAL HISTORY:  Christian Weeks is here for a follow up of sigmoid colon cancer . He was last seen by NP Lacie on 09/27/2022. He presents to the clinic alone. Pt report that he is doing well. Pt also states there is no change in BM. Pt state that he is still working and active.    All other systems were reviewed with the patient and are negative.  MEDICAL HISTORY:  Past Medical History:  Diagnosis Date   Arthritis    Blood clot in vein 01/2016   Colon cancer    Colon polyps    Degenerative joint disease    Genetic testing 06/11/18   Multi-Cancer panel (83 genes) @ [facility] - No pathogenic mutations detected   History of kidney stones age 31's   Hypertension    Hypothyroidism    Mass of colon    sigmoid   Stroke 01/2016   speech difficulty for a few hours after knee replacement left   Thoracic ascending aortic aneurysm    4.6-4.7 cm followed by Dr Tyrone Sage- LOV- 08/2017-epic    Wears dentures    Wears glasses     SURGICAL HISTORY: Past Surgical History:  Procedure Laterality Date   colonscopy  02/26/2018   KNEE ARTHROPLASTY Left 01/15/2016   Procedure: COMPUTER ASSISTED TOTAL KNEE ARTHROPLASTY;  Surgeon: Eldred Manges, MD;  Location: [facility] OR;  Service: Orthopedics;  Laterality: Left;   LAPAROSCOPIC SIGMOID COLECTOMY N/A 03/08/2018   Procedure: LAPAROSCOPIC ASSISTED SIGMOID COLECTOMY ERAS PATHWAY;  Surgeon: Griselda Miner, MD;  Location: [facility];  Service: General;  Laterality: N/A;   MULTIPLE TOOTH EXTRACTIONS     TOTAL KNEE ARTHROPLASTY Right 01/13/2017   Procedure: RIGHT TOTAL KNEE ARTHROPLASTY;  Surgeon: Eldred Manges, MD;  Location: [facility] OR;  Service: Orthopedics;  Laterality: Right;    I have reviewed the social history and family  history with the patient and they are unchanged from previous note.  ALLERGIES:  is allergic to quinolones and poison oak extract.  MEDICATIONS:  Current Outpatient Medications  Medication Sig Dispense Refill   Cholecalciferol (VITAMIN D3) 3000 units TABS Take 3,000 Units by mouth daily.     levothyroxine (SYNTHROID) 125 MCG tablet Take 125 mcg by mouth daily before breakfast.     rosuvastatin (CRESTOR) 10 MG tablet Take 10 mg by mouth every other day.     No current facility-administered medications for this visit.    PHYSICAL EXAMINATION: ECOG PERFORMANCE STATUS: 0 - Asymptomatic  Vitals:   04/13/23 0805  BP: (!) 130/93  Pulse: (!) 55  Resp: 18  Temp: 97.8 F (36.6 C)  SpO2: 97%   Wt Readings from Last 3 Encounters:  04/13/23 244 lb 14.4 oz (111.1 kg)  09/27/22 243 lb 1.6 oz (110.3 kg)  05/18/22 247 lb (112 kg)     GENERAL:alert, no distress and comfortable SKIN: skin color normal, no rashes or significant lesions EYES: normal, Conjunctiva are pink and non-injected, sclera clear  NEURO: alert & oriented x 3 with fluent speech NECK: (-) supple, thyroid normal size, non-tender, without nodularity LYMPH:  (-)no palpable lymphadenopathy in the cervical, axillary  LUNGS(-): clear to auscultation and percussion with normal breathing effort HEART: (-) regular rate & rhythm and no murmurs and no lower  extremity edema ABDOMEN:(-)abdomen soft,(-)  non-tender and(-)  normal bowel sounds    ABORATORY DATA:  I have reviewed the data as listed    Latest Ref Rng & Units 04/13/2023    7:29 AM 09/27/2022   10:16 AM 08/19/2021    7:59 AM  CBC  WBC 4.0 - 10.5 K/uL 7.0  8.8  6.3   Hemoglobin 13.0 - 17.0 g/dL 16.1  09.6  04.5   Hematocrit 39.0 - 52.0 % 46.1  45.9  47.0   Platelets 150 - 400 K/uL 188  177  161         Latest Ref Rng & Units 04/13/2023    7:29 AM 09/27/2022   10:16 AM 08/19/2021    7:59 AM  CMP  Glucose 70 - 99 mg/dL 409  87  96   BUN 8 - 23 mg/dL 15  16  13     Creatinine 0.61 - 1.24 mg/dL 8.11  9.14  7.82   Sodium 135 - 145 mmol/L 140  142  140   Potassium 3.5 - 5.1 mmol/L 4.1  4.5  4.5   Chloride 98 - 111 mmol/L 110  108  109   CO2 22 - 32 mmol/L 22  29  24    Calcium 8.9 - 10.3 mg/dL 9.1  9.6  9.1   Total Protein 6.5 - 8.1 g/dL 7.1  6.8  7.2   Total Bilirubin 0.3 - 1.2 mg/dL 0.9  0.9  0.9   Alkaline Phos 38 - 126 U/L 98  83  93   AST 15 - 41 U/L 18  19  20    ALT 0 - 44 U/L 13  16  18        RADIOGRAPHIC STUDIES: I have personally reviewed the radiological images as listed and agreed with the findings in the report. No results found.    No orders of the defined types were placed in this encounter.  All questions were answered. The patient knows to call the clinic with any problems, questions or concerns. No barriers to learning was detected. The total time spent in the appointment was 15 minutes.     Malachy Mood, MD 04/13/2023   Carolin Coy, CMA, am acting as scribe for Malachy Mood, MD.   I have reviewed the above documentation for accuracy and completeness, and I agree with the above.

## 2023-04-12 NOTE — Assessment & Plan Note (Signed)
-  Diagnosed in 02/2018. S/p sigmoid hemicolectomy and adjuvant chemo with CAPOX.  -last colonoscopy in 06/2022 was unremarkable  -he is clinically doing well.  Lab reviewed, exam was unremarkable, there is no clinical concern for recurrence -He has completed 5 years of cancer surveillance, his risk of recurrence is minimal now.  I will see him as needed.

## 2023-04-13 ENCOUNTER — Inpatient Hospital Stay: Payer: Medicare HMO | Attending: Hematology

## 2023-04-13 ENCOUNTER — Encounter: Payer: Self-pay | Admitting: Hematology

## 2023-04-13 ENCOUNTER — Inpatient Hospital Stay: Payer: Medicare HMO | Admitting: Hematology

## 2023-04-13 ENCOUNTER — Other Ambulatory Visit: Payer: Self-pay

## 2023-04-13 VITALS — BP 130/93 | HR 55 | Temp 97.8°F | Resp 18 | Ht 72.0 in | Wt 244.9 lb

## 2023-04-13 DIAGNOSIS — C772 Secondary and unspecified malignant neoplasm of intra-abdominal lymph nodes: Secondary | ICD-10-CM | POA: Diagnosis not present

## 2023-04-13 DIAGNOSIS — Z85038 Personal history of other malignant neoplasm of large intestine: Secondary | ICD-10-CM | POA: Diagnosis not present

## 2023-04-13 DIAGNOSIS — N400 Enlarged prostate without lower urinary tract symptoms: Secondary | ICD-10-CM | POA: Diagnosis not present

## 2023-04-13 DIAGNOSIS — Z9221 Personal history of antineoplastic chemotherapy: Secondary | ICD-10-CM | POA: Insufficient documentation

## 2023-04-13 DIAGNOSIS — C187 Malignant neoplasm of sigmoid colon: Secondary | ICD-10-CM | POA: Diagnosis not present

## 2023-04-13 LAB — CBC WITH DIFFERENTIAL (CANCER CENTER ONLY)
Abs Immature Granulocytes: 0.02 10*3/uL (ref 0.00–0.07)
Basophils Absolute: 0.1 10*3/uL (ref 0.0–0.1)
Basophils Relative: 1 %
Eosinophils Absolute: 0.4 10*3/uL (ref 0.0–0.5)
Eosinophils Relative: 5 %
HCT: 46.1 % (ref 39.0–52.0)
Hemoglobin: 16.1 g/dL (ref 13.0–17.0)
Immature Granulocytes: 0 %
Lymphocytes Relative: 20 %
Lymphs Abs: 1.4 10*3/uL (ref 0.7–4.0)
MCH: 31.4 pg (ref 26.0–34.0)
MCHC: 34.9 g/dL (ref 30.0–36.0)
MCV: 89.9 fL (ref 80.0–100.0)
Monocytes Absolute: 0.8 10*3/uL (ref 0.1–1.0)
Monocytes Relative: 11 %
Neutro Abs: 4.4 10*3/uL (ref 1.7–7.7)
Neutrophils Relative %: 63 %
Platelet Count: 188 10*3/uL (ref 150–400)
RBC: 5.13 MIL/uL (ref 4.22–5.81)
RDW: 12.2 % (ref 11.5–15.5)
WBC Count: 7 10*3/uL (ref 4.0–10.5)
nRBC: 0 % (ref 0.0–0.2)

## 2023-04-13 LAB — CMP (CANCER CENTER ONLY)
ALT: 13 U/L (ref 0–44)
AST: 18 U/L (ref 15–41)
Albumin: 4.1 g/dL (ref 3.5–5.0)
Alkaline Phosphatase: 98 U/L (ref 38–126)
Anion gap: 8 (ref 5–15)
BUN: 15 mg/dL (ref 8–23)
CO2: 22 mmol/L (ref 22–32)
Calcium: 9.1 mg/dL (ref 8.9–10.3)
Chloride: 110 mmol/L (ref 98–111)
Creatinine: 1.21 mg/dL (ref 0.61–1.24)
GFR, Estimated: >60 mL/min (ref >60)
Glucose, Bld: 109 mg/dL — ABNORMAL HIGH (ref 70–99)
Potassium: 4.1 mmol/L (ref 3.5–5.1)
Sodium: 140 mmol/L (ref 135–145)
Total Bilirubin: 0.9 mg/dL (ref 0.3–1.2)
Total Protein: 7.1 g/dL (ref 6.5–8.1)

## 2023-04-13 LAB — CEA (IN HOUSE-CHCC): CEA (CHCC-In House): 1.65 ng/mL (ref 0.00–5.00)

## 2023-04-20 DIAGNOSIS — H3530 Unspecified macular degeneration: Secondary | ICD-10-CM | POA: Diagnosis not present

## 2023-04-20 DIAGNOSIS — H5340 Unspecified visual field defects: Secondary | ICD-10-CM | POA: Diagnosis not present

## 2023-04-20 DIAGNOSIS — H52223 Regular astigmatism, bilateral: Secondary | ICD-10-CM | POA: Diagnosis not present

## 2023-04-20 DIAGNOSIS — H5213 Myopia, bilateral: Secondary | ICD-10-CM | POA: Diagnosis not present

## 2023-04-20 DIAGNOSIS — Z0100 Encounter for examination of eyes and vision without abnormal findings: Secondary | ICD-10-CM | POA: Diagnosis not present

## 2023-05-24 ENCOUNTER — Encounter: Payer: Self-pay | Admitting: Surgery

## 2023-05-24 ENCOUNTER — Ambulatory Visit: Payer: Medicare HMO | Admitting: Surgery

## 2023-05-24 ENCOUNTER — Ambulatory Visit
Admission: RE | Admit: 2023-05-24 | Discharge: 2023-05-24 | Disposition: A | Discharge status: Home / Self Care | Payer: Medicare HMO | Source: Ambulatory Visit | Attending: Surgery | Admitting: Surgery

## 2023-05-24 VITALS — BP 126/78 | HR 69 | Resp 20 | Ht 72.0 in | Wt 240.0 lb

## 2023-05-24 DIAGNOSIS — I7121 Aneurysm of the ascending aorta, without rupture: Secondary | ICD-10-CM

## 2023-05-24 DIAGNOSIS — R918 Other nonspecific abnormal finding of lung field: Secondary | ICD-10-CM | POA: Diagnosis not present

## 2023-05-24 DIAGNOSIS — I700 Atherosclerosis of aorta: Secondary | ICD-10-CM | POA: Diagnosis not present

## 2023-05-24 MED ORDER — IOPAMIDOL (ISOVUE-370) INJECTION 76%
75.0000 mL | Freq: Once | INTRAVENOUS | Status: AC | PRN
  Administered 2023-05-24: 75 mL via INTRAVENOUS

## 2023-05-24 NOTE — Progress Notes (Unsigned)
    HPI: ***  Current Outpatient Medications  Medication Sig Dispense Refill   Cholecalciferol (VITAMIN D3) 3000 units TABS Take 3,000 Units by mouth daily.     levothyroxine (SYNTHROID) 125 MCG tablet Take 125 mcg by mouth daily before breakfast.     rosuvastatin (CRESTOR) 10 MG tablet Take 10 mg by mouth every other day.     No current facility-administered medications for this visit.     Physical Exam: ***  Diagnostic Tests: ***  Impression: ***  Plan: ***   Christian Weeks K Zyair Rhein, MD [facility] [phone]       

## 2023-10-04 ENCOUNTER — Other Ambulatory Visit: Payer: Self-pay | Admitting: Surgery

## 2023-10-04 DIAGNOSIS — M25562 Pain in left knee: Secondary | ICD-10-CM | POA: Diagnosis not present

## 2023-10-04 DIAGNOSIS — I7121 Aneurysm of the ascending aorta, without rupture: Secondary | ICD-10-CM

## 2023-10-23 ENCOUNTER — Ambulatory Visit: Payer: Medicare HMO | Admitting: Orthopaedic Surgery

## 2023-10-23 ENCOUNTER — Other Ambulatory Visit (INDEPENDENT_AMBULATORY_CARE_PROVIDER_SITE_OTHER): Payer: Medicare HMO

## 2023-10-23 DIAGNOSIS — M25562 Pain in left knee: Secondary | ICD-10-CM

## 2023-10-23 DIAGNOSIS — Z96652 Presence of left artificial knee joint: Secondary | ICD-10-CM

## 2023-10-23 DIAGNOSIS — G8929 Other chronic pain: Secondary | ICD-10-CM

## 2023-10-23 MED ORDER — METHYLPREDNISOLONE 4 MG PO TABS: ORAL_TABLET | ORAL | 0 refills | Status: AC

## 2023-10-23 NOTE — Progress Notes (Signed)
The patient is a 72 year old gentleman that I am seeing for the first time.  It is mainly because he felt Dr. Ophelia Charter had retired.  Dr. Ophelia Charter has replaced both of his knees.  The left knee was placed in 2017 and the right knee in 2018.  He still works good bit.  He said he had pain for about 2 or 3 months with his left knee with no known injury.  He does ambulate using a cane or crutches and he feels like it slightly worsening.  He takes only Tylenol on occasion.  Examination of his left knee shows good range of motion of the left knee.  There is a laceration on the medial aspect of the knee that happened only a few days ago.  There is just a minimal effusion with his left knee and it does not feel ligamentously unstable to me.  2 views left knee show well-seated implant with a knee replacement with no gross evidence of loosening or complicating features.  I would like to put him on a steroid taper just to calm down inflammation in his knee.  He will work on Dance movement psychotherapist exercises as well.  I did offer him physical therapy but he would like to hold off on that.  Even just a copper fit knee sleeve may give him some support to get his knee feeling better.  With that being said though we would like him to follow-up in the next 2 to 3 weeks to see how he is doing overall.  This can also be with Dr. Ophelia Charter.

## 2023-10-26 DIAGNOSIS — E039 Hypothyroidism, unspecified: Secondary | ICD-10-CM | POA: Diagnosis not present

## 2023-10-26 DIAGNOSIS — I10 Essential (primary) hypertension: Secondary | ICD-10-CM | POA: Diagnosis not present

## 2023-10-26 DIAGNOSIS — K7689 Other specified diseases of liver: Secondary | ICD-10-CM | POA: Diagnosis not present

## 2023-10-26 DIAGNOSIS — R7303 Prediabetes: Secondary | ICD-10-CM | POA: Diagnosis not present

## 2023-10-26 DIAGNOSIS — Z125 Encounter for screening for malignant neoplasm of prostate: Secondary | ICD-10-CM | POA: Diagnosis not present

## 2023-10-27 LAB — LAB REPORT - SCANNED
A1c: 5.6
EGFR: 62

## 2023-10-30 DIAGNOSIS — M810 Age-related osteoporosis without current pathological fracture: Secondary | ICD-10-CM | POA: Diagnosis not present

## 2023-10-30 DIAGNOSIS — I6529 Occlusion and stenosis of unspecified carotid artery: Secondary | ICD-10-CM | POA: Diagnosis not present

## 2023-10-30 DIAGNOSIS — K7689 Other specified diseases of liver: Secondary | ICD-10-CM | POA: Diagnosis not present

## 2023-10-30 DIAGNOSIS — Z0000 Encounter for general adult medical examination without abnormal findings: Secondary | ICD-10-CM | POA: Diagnosis not present

## 2023-10-30 DIAGNOSIS — I10 Essential (primary) hypertension: Secondary | ICD-10-CM | POA: Diagnosis not present

## 2023-10-30 DIAGNOSIS — E032 Hypothyroidism due to medicaments and other exogenous substances: Secondary | ICD-10-CM | POA: Diagnosis not present

## 2023-10-30 DIAGNOSIS — I712 Thoracic aortic aneurysm, without rupture, unspecified: Secondary | ICD-10-CM | POA: Diagnosis not present

## 2023-10-30 DIAGNOSIS — I700 Atherosclerosis of aorta: Secondary | ICD-10-CM | POA: Diagnosis not present

## 2023-11-01 DIAGNOSIS — K7689 Other specified diseases of liver: Secondary | ICD-10-CM | POA: Diagnosis not present

## 2023-11-01 DIAGNOSIS — R945 Abnormal results of liver function studies: Secondary | ICD-10-CM | POA: Diagnosis not present

## 2023-11-14 ENCOUNTER — Encounter: Payer: Self-pay | Admitting: Surgery

## 2023-11-14 ENCOUNTER — Encounter: Payer: Self-pay | Admitting: Orthopaedic Surgery

## 2023-11-14 ENCOUNTER — Ambulatory Visit: Payer: Medicare HMO | Admitting: Orthopaedic Surgery

## 2023-11-14 VITALS — BP 125/88 | HR 67 | Ht 72.0 in | Wt 222.0 lb

## 2023-11-14 DIAGNOSIS — M6281 Muscle weakness (generalized): Secondary | ICD-10-CM | POA: Insufficient documentation

## 2023-11-14 DIAGNOSIS — Z96652 Presence of left artificial knee joint: Secondary | ICD-10-CM | POA: Insufficient documentation

## 2023-11-14 DIAGNOSIS — Z96651 Presence of right artificial knee joint: Secondary | ICD-10-CM | POA: Diagnosis not present

## 2023-11-14 NOTE — Progress Notes (Signed)
Office Visit Note   Patient: Christian Weeks           Date of Birth: 1951-09-23           MRN: [id number] Visit Date: 11/14/2023              Requested by: Merri Brunette, MD [address] [place],  [place] [address] PCP: Merri Brunette, MD   Assessment & Plan: Visit Diagnoses:  1. Hx of total knee arthroplasty, left   2. S/P total knee arthroplasty, right   3. Weakness of left quadriceps muscle     Plan: We went over multiple exercises he can do to strengthen his quad and let us know if he like formal therapy.  Knee x-rays look good no loosening or subsidence.  He will call if he like some formal physical therapy.  Follow-Up Instructions: Return if symptoms worsen or fail to improve.   Orders:  No orders of the defined types were placed in this encounter.  No orders of the defined types were placed in this encounter.     Procedures: No procedures performed   Clinical Data: No additional findings.   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  Has had pain in his left knee also related to a Pyrtle with oblique laceration over the VMO region which has eschar and is healing.  No cellulitis.  Opposite right total knee is doing well but he had to step up in the truck with his left knee first each time which was 6-8 times per shift.  Patient's had to give up the job he had.  Review of Systems all systems noncontributory to HPI.  Thoracic aortic aneurysm stable.  History of DVT.  Not currently on blood thinner.   Objective: Vital Signs: BP 125/88   Pulse 67   Ht 6' (1.829 m)   Wt 222 lb (100.7 kg)   BMI 30.11 kg/m   Physical Exam Constitutional:      Appearance: He is well-developed.  HENT:     Head: Normocephalic and atraumatic.     Right  Ear: External ear normal.     Left Ear: External ear normal.  Eyes:     Pupils: Pupils are equal, round, and reactive to light.  Neck:     Thyroid: No thyromegaly.     Trachea: No tracheal deviation.  Cardiovascular:     Rate and Rhythm: Normal rate.  Pulmonary:     Effort: Pulmonary effort is normal.     Breath sounds: No wheezing.  Abdominal:     General: Bowel sounds are normal.     Palpations: Abdomen is soft.  Musculoskeletal:     Cervical back: Neck supple.  Skin:    General: Skin is warm and dry.     Capillary Refill: Capillary refill takes less than 2 seconds.  Neurological:     Mental Status: He is alert and oriented to person, place, and time.  Psychiatric:        Behavior: Behavior normal.        Thought Content: Thought content normal.        Judgment: Judgment normal.     Ortho Exam some tenderness over the VMO and semitendinosis.  No definite knee effusion.  Oblique laceration with eschar over the VMO region healing.  Collateral ligaments are stable no AP instability.  He is ambulating with the left knee limp using a cane.  Quad on the left knee is weak right quad is strong.  Specialty Comments:  No specialty comments available.  Imaging: No results found.   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) 03/08/2018   Thoracic aortic aneurysm without rupture (HCC) 02/21/2017   DVT (deep venous thrombosis), left 04/13/2016   Cerebrovascular accident (CVA) due to embolism of cerebral artery (HCC) 04/13/2016   S/P knee surgery 04/13/2016   PFO (patent foramen ovale)    HLD (hyperlipidemia)    Acute CVA (cerebrovascular accident) (HCC) 02/06/2016   TIA (transient ischemic attack) 02/05/2016   Stroke (HCC) 02/05/2016   Hypothyroidism     Arthritis    Calf pain    S/P total knee arthroplasty, right 01/15/2016   Past Medical History:  Diagnosis Date   Arthritis    Blood clot in vein 01/2016   Colon cancer (HCC)    Colon polyps    Degenerative joint disease    Genetic testing 06/11/18   Multi-Cancer panel (83 genes) @ [facility] - No pathogenic mutations detected   History of kidney stones age 40's   Hypertension    Hypothyroidism    Mass of colon    sigmoid   Stroke (HCC) 01/2016   speech difficulty for a few hours after knee replacement left   Thoracic ascending aortic aneurysm (HCC)    4.6-4.7 cm followed by Dr Tyrone Sage- LOV- 08/2017-epic    Wears dentures    Wears glasses     Family History  Problem Relation Age of Onset   Congestive Heart Failure Mother    Diabetes Mother    Thyroid disease Sister     Past Surgical History:  Procedure Laterality Date   colonscopy  02/26/2018   KNEE ARTHROPLASTY Left 01/15/2016   Procedure: COMPUTER ASSISTED TOTAL KNEE ARTHROPLASTY;  Surgeon: Eldred Manges, MD;  Location: [facility] OR;  Service: Orthopedics;  Laterality: Left;   LAPAROSCOPIC SIGMOID COLECTOMY N/A 03/08/2018   Procedure: LAPAROSCOPIC ASSISTED SIGMOID COLECTOMY ERAS PATHWAY;  Surgeon: Griselda Miner, MD;  Location: [facility];  Service: General;  Laterality: N/A;   MULTIPLE TOOTH EXTRACTIONS     TOTAL KNEE ARTHROPLASTY Right 01/13/2017   Procedure: RIGHT TOTAL KNEE ARTHROPLASTY;  Surgeon: Eldred Manges, MD;  Location: [facility] OR;  Service: Orthopedics;  Laterality: Right;   Social History   Occupational History   Occupation: part time job - delivers car parts  Tobacco Use   Smoking status: Former    Current packs/day: 0.00    Average packs/day: 1.5 packs/day for 30.0 years (45.0 ttl pk-yrs)    Types: Cigarettes    Start date: 11/16/1966    Quit date: 11/16/1996    Years since quitting: 27.0   Smokeless tobacco: Never   Tobacco comments:    quit 25 yrs ago  Vaping Use   Vaping status: Never Used  Substance and  Sexual Activity   Alcohol use: No   Drug use: No   Sexual activity: Not on file

## 2023-11-20 ENCOUNTER — Ambulatory Visit
Admission: RE | Admit: 2023-11-20 | Discharge: 2023-11-20 | Disposition: A | Discharge status: Home / Self Care | Payer: Medicare HMO | Source: Ambulatory Visit | Attending: Surgery | Admitting: Surgery

## 2023-11-20 DIAGNOSIS — I7121 Aneurysm of the ascending aorta, without rupture: Secondary | ICD-10-CM | POA: Diagnosis not present

## 2023-11-20 MED ORDER — IOPAMIDOL (ISOVUE-370) INJECTION 76%
75.0000 mL | Freq: Once | INTRAVENOUS | Status: AC | PRN
  Administered 2023-11-20: 75 mL via INTRAVENOUS

## 2023-11-29 ENCOUNTER — Encounter: Payer: Self-pay | Admitting: Surgery

## 2023-11-29 ENCOUNTER — Ambulatory Visit: Payer: Medicare HMO | Admitting: Surgery

## 2023-11-29 VITALS — BP 130/90 | HR 72 | Resp 20 | Ht 72.0 in | Wt 222.0 lb

## 2023-11-29 DIAGNOSIS — I7121 Aneurysm of the ascending aorta, without rupture: Secondary | ICD-10-CM | POA: Diagnosis not present

## 2023-11-29 DIAGNOSIS — I10 Essential (primary) hypertension: Secondary | ICD-10-CM | POA: Diagnosis not present

## 2023-11-29 DIAGNOSIS — I714 Abdominal aortic aneurysm, without rupture, unspecified: Secondary | ICD-10-CM | POA: Diagnosis not present

## 2023-11-29 NOTE — Progress Notes (Signed)
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  He has a trileaflet aortic valve without stenosis or insufficiency. 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  He has completed 5 years of cancer surveillance without evidence of recurrence.  He denies any chest or back pain.  His only complaint is of some stiffness and weakness in his left knee which has been replaced in the past.  He saw his orthopedic surgeon and they did not find any structural abnormality.  He is undergoing physical therapy.  Current Outpatient Medications  Medication Sig Dispense Refill   Cholecalciferol (VITAMIN D3) 3000 units TABS Take 3,000 Units by mouth daily.     levothyroxine (SYNTHROID) 125 MCG tablet Take 125 mcg by mouth daily before breakfast.     methylPREDNISolone (MEDROL) 4 MG tablet Medrol dose pack. Take as instructed 21 tablet 0   rosuvastatin (CRESTOR) 10 MG tablet Take 10 mg by mouth every other day.     No current facility-administered medications for this visit.     Physical Exam: BP (!) 130/90   Pulse 72   Resp 20   Ht 6' (1.829 m)   Wt 222 lb (100.7 kg)   SpO2 92% Comment: RA  BMI 30.11 kg/m  He looks well. Cardiac exam shows regular rate and rhythm with normal heart sounds.  There is no murmur. Lungs are clear.  Diagnostic Tests:  Narrative & Impression  CLINICAL DATA:  Follow up thoracic aortic aneurysm.   EXAM: CT ANGIOGRAPHY CHEST WITH CONTRAST   TECHNIQUE: Multidetector CT imaging of the chest was performed using the standard protocol during bolus administration of intravenous contrast. Multiplanar CT image reconstructions and MIPs were obtained to evaluate the vascular anatomy.   RADIATION DOSE REDUCTION: This exam was performed according to the departmental dose-optimization  program which includes automated exposure control, adjustment of the mA and/or kV according to patient size and/or use of iterative reconstruction technique.   CONTRAST:  75mL ISOVUE-370 IOPAMIDOL (ISOVUE-370) INJECTION 76%   COMPARISON:  Chest CTA 05/24/2023 and 05/18/2022.   FINDINGS: Cardiovascular: On today's study, there is mild pulsation artifact within the ascending aorta. Allowing for this, no significant change in mild dilatation of the tubular portion which has a maximal diameter of 4.7 cm. No evidence of aortic dissection. The aortic arch and descending aorta are normal in caliber. The descending aorta is tortuous. There are calcifications of the aortic valve. Mild atherosclerosis of the aorta, great vessels and coronary arteries. The pulmonary arteries appear patent. The heart size is normal. There is no pericardial effusion.   Mediastinum/Nodes: There are no enlarged mediastinal, hilar or axillary lymph nodes. The thyroid gland, trachea and esophagus demonstrate no significant findings.   Lungs/Pleura: No pleural effusion or pneumothorax. Chronic subpleural reticulation and mild chronic central airway thickening. 6 mm lingular nodule on image 100/7 is unchanged, consistent with a benign finding. There are scattered small calcified granulomas. No suspicious pulmonary nodules or confluent airspace opacities.   Upper abdomen: The visualized upper abdomen appears stable, without significant findings. There are duodenal diverticula.   Musculoskeletal/Chest wall: There is no chest wall mass or suspicious osseous finding. Multilevel spondylosis. Degenerative changes at both shoulders.   Unless specific follow-up recommendations are mentioned in the findings or impression sections, no imaging follow-up of any mentioned incidental findings is  recommended.   Review of the MIP images confirms the above findings.   IMPRESSION: 1. Stable mild dilatation of the ascending  aorta measuring 4.7 cm. Consider continued annual imaging follow-up by CTA. 2. No acute chest findings. 3. Stable mild chronic lung disease with scattered subpleural reticulation and mild central airway thickening.     Electronically Signed   By: Carey Bullocks M.D.   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  Previous echo in 2017 had shown a trileaflet aortic valve with mildly calcified leaflets.  His aneurysm is well below the surgical threshold of 5.5 cm.  I reviewed his CTA images with him and answered all his questions.  I stressed the importance of continued good blood pressure control in preventing further enlargement and acute aortic dissection.  I advised him against doing any heavy lifting that may require a Valsalva maneuver and could suddenly raise his blood pressure to high levels.   Plan:  I will see him back in 1 year with a CTA of the chest for aortic surveillance.  I spent 15 minutes performing this established patient evaluation and > 50% of this time was spent face to face counseling and coordinating the care of this patient's aortic aneurysm.    Alleen Borne, MD [facility] [phone]

## 2023-12-06 DIAGNOSIS — I10 Essential (primary) hypertension: Secondary | ICD-10-CM | POA: Diagnosis not present

## 2023-12-07 LAB — LAB REPORT - SCANNED: EGFR: 58

## 2023-12-29 DIAGNOSIS — R7989 Other specified abnormal findings of blood chemistry: Secondary | ICD-10-CM | POA: Diagnosis not present

## 2023-12-29 DIAGNOSIS — M25552 Pain in left hip: Secondary | ICD-10-CM | POA: Diagnosis not present

## 2023-12-29 DIAGNOSIS — I10 Essential (primary) hypertension: Secondary | ICD-10-CM | POA: Diagnosis not present

## 2024-01-03 ENCOUNTER — Ambulatory Visit (INDEPENDENT_AMBULATORY_CARE_PROVIDER_SITE_OTHER): Payer: Medicare HMO | Admitting: Family Medicine

## 2024-01-03 ENCOUNTER — Encounter: Payer: Self-pay | Admitting: Family Medicine

## 2024-01-03 ENCOUNTER — Ambulatory Visit (HOSPITAL_BASED_OUTPATIENT_CLINIC_OR_DEPARTMENT_OTHER)
Admission: RE | Admit: 2024-01-03 | Discharge: 2024-01-03 | Disposition: A | Discharge status: Home / Self Care | Payer: Medicare HMO | Source: Ambulatory Visit | Attending: Family Medicine | Admitting: Family Medicine

## 2024-01-03 VITALS — BP 120/86 | Ht 72.0 in | Wt 222.0 lb

## 2024-01-03 DIAGNOSIS — S72112A Displaced fracture of greater trochanter of left femur, initial encounter for closed fracture: Secondary | ICD-10-CM | POA: Diagnosis not present

## 2024-01-03 DIAGNOSIS — M25552 Pain in left hip: Secondary | ICD-10-CM | POA: Diagnosis not present

## 2024-01-03 DIAGNOSIS — M1612 Unilateral primary osteoarthritis, left hip: Secondary | ICD-10-CM | POA: Diagnosis not present

## 2024-01-03 DIAGNOSIS — M47816 Spondylosis without myelopathy or radiculopathy, lumbar region: Secondary | ICD-10-CM | POA: Diagnosis not present

## 2024-01-03 NOTE — Progress Notes (Signed)
 CHIEF COMPLAINT: No chief complaint on file.  _____________________________________________________________ SUBJECTIVE  HPI  Pt is a 73 y.o. 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, which involves walking  Started August 2023  10 minutes rest, fine  But has been getting progressively worse to the point that he had to quit his job  Hospital Doctor to Christian Weeks who did the surgery, did XR, felt it was either muscle or nerve related  Sometimes can hardly feel any pain, and sometimes it hurts quite a bit Primarily located posterior leg, over his hamstring  Hx fracture of L hip  Pain in knee as well Radiating: none Numbness/tingling: none Exacerbated by  Going to the grocery store; will bother left thigh and knee, and will use a buggy History of bilateral TKR Therapies include: using an exercise bike to keep toned up  ------------------------------------------------------------------------------------------------------ Past Medical History:  Diagnosis Date   Arthritis    Blood clot in vein 01/2016   Colon cancer (HCC)    Colon polyps    Degenerative joint disease    Genetic testing 06/11/18   Multi-Cancer panel (83 genes) @ [facility] - No pathogenic mutations detected   History of kidney stones age 67's   Hypertension    Hypothyroidism    Mass of colon    sigmoid   Stroke (HCC) 01/2016   speech difficulty for a few hours after knee replacement left   Thoracic ascending aortic aneurysm (HCC)    4.6-4.7 cm followed by Dr Army- LOV- 08/2017-epic    Wears dentures    Wears glasses     Past Surgical History:  Procedure Laterality Date   colonscopy  02/26/2018   KNEE ARTHROPLASTY Left 01/15/2016   Procedure: COMPUTER ASSISTED TOTAL KNEE ARTHROPLASTY;  Surgeon: Christian JAYSON Barbarann, MD;  Location: [facility] OR;  Service: Orthopedics;  Laterality: Left;   LAPAROSCOPIC SIGMOID COLECTOMY N/A 03/08/2018   Procedure: LAPAROSCOPIC ASSISTED SIGMOID  COLECTOMY ERAS PATHWAY;  Surgeon: Christian Deward MOULD, MD;  Location: [facility]S;  Service: General;  Laterality: N/A;   MULTIPLE TOOTH EXTRACTIONS     TOTAL KNEE ARTHROPLASTY Right 01/13/2017   Procedure: RIGHT TOTAL KNEE ARTHROPLASTY;  Surgeon: Christian JAYSON Barbarann, MD;  Location: [facility] OR;  Service: Orthopedics;  Laterality: Right;      Outpatient Encounter Medications as of 01/03/2024  Medication Sig   Cholecalciferol  (VITAMIN D3) 3000 units TABS Take 3,000 Units by mouth daily.   levothyroxine  (SYNTHROID ) 125 MCG tablet Take 125 mcg by mouth daily before breakfast.   methylPREDNISolone  (MEDROL ) 4 MG tablet Medrol  dose pack. Take as instructed   rosuvastatin  (CRESTOR ) 10 MG tablet Take 10 mg by mouth every other day.   No facility-administered encounter medications on file as of 01/03/2024.    ------------------------------------------------------------------------------------------------------  _____________________________________________________________ OBJECTIVE  PHYSICAL EXAM  Today's Vitals   01/03/24 0818  BP: 120/86  Weight: 222 lb (100.7 kg)  Height: 6' (1.829 m)   Body mass index is 30.11 kg/m.   reviewed  General: A+Ox3, no acute distress, well-nourished, appropriate affect CV: pulses 2+ regular, nondiaphoretic, no peripheral edema, cap refill <2sec Lungs: no audible wheezing, non-labored breathing, bilateral chest rise/fall, nontachypneic Skin: warm, well-perfused, non-icteric, no susp lesions or rashes Neuro: Sensation intact, muscle tone wnl, no atrophy Psych: no signs of depression or anxiety MSK:   Lower back: SIJ alignment appropriate, some limitations with lumbar flexion and extension, asymmetric SIJ mobility Lumbar paraspinal muscle hypertonicity B/l trendelenberg Glute TTP,  TTP isch tub  L Hip: No deformity, swelling or wasting ROM Flexion 100, ER 15, IR 40 NTTP over the hip flexors, adductors, inguinal canal, pubic ramus, greater troch, glute musculature, si joint,  lumbar spine Negative log roll  Negative FABER, decreased ROM noted Negative FADIR Negative Piriformis  +scour +quad tightness eliciting pain b/l +hamstring tightness, activation of hamstrings reproduces posterior leg/thigh pain just distal to ischial tuberosity Palpable osseous deformity over patella  _____________________________________________________________ ASSESSMENT/PLAN Diagnoses and all orders for this visit:  Left hip pain -     DG HIP UNILAT W OR W/O PELVIS 2-3 VIEWS LEFT; Future -     Ambulatory referral to Physical Therapy   Posterior proximal hamstring region pain is multifactorial, no radicular symptoms. Suspected hamstring tendinosis on hip OA and weak glute activation. Signs of patellar arthritis on exam, proximal knee pain suspected due to significant quad tightness. No films available for review. Hip XR today. Large portion of today's visit discussing exam findings, mechanics of pain, and conservative therapies. PT referral placed. Reviewed HEP/stretches in interim. Anticipate f/u 4-6 weeks, discussed returning sooner if needed. All questions answered. Return precautions discussed. Patient verbalized understanding and is in agreement with plan  Electronically signed by: Christian Weeks W Christian Lopata, MD 01/03/2024 7:50 AM

## 2024-01-17 ENCOUNTER — Ambulatory Visit: Payer: Medicare HMO | Admitting: Physical Therapy

## 2024-01-25 ENCOUNTER — Other Ambulatory Visit: Payer: Self-pay

## 2024-01-25 ENCOUNTER — Ambulatory Visit: Payer: Medicare HMO | Attending: Family Medicine

## 2024-01-25 DIAGNOSIS — M25552 Pain in left hip: Secondary | ICD-10-CM | POA: Insufficient documentation

## 2024-01-25 DIAGNOSIS — R262 Difficulty in walking, not elsewhere classified: Secondary | ICD-10-CM | POA: Diagnosis not present

## 2024-01-25 DIAGNOSIS — M25652 Stiffness of left hip, not elsewhere classified: Secondary | ICD-10-CM | POA: Diagnosis not present

## 2024-01-25 DIAGNOSIS — M79652 Pain in left thigh: Secondary | ICD-10-CM | POA: Diagnosis not present

## 2024-01-25 NOTE — Therapy (Signed)
OUTPATIENT PHYSICAL THERAPY LOWER EXTREMITY EVALUATION   Patient Name: Christian Weeks MRN: [id number] DOB:Jul 01, 1951, 73 y.o., male Today's Date: 01/25/2024  END OF SESSION:  PT End of Session - 01/25/24 1143     Visit Number 1    Date for PT Re-Evaluation 03/21/24    Progress Note Due on Visit 10    PT Start Time 1150    PT Stop Time 1234    PT Time Calculation (min) 44 min    Activity Tolerance Patient tolerated treatment well    Behavior During Therapy [facility] for tasks assessed/performed             Past Medical History:  Diagnosis Date   Arthritis    Blood clot in vein 01/2016   Colon cancer (HCC)    Colon polyps    Degenerative joint disease    Genetic testing 06/11/18   Multi-Cancer panel (83 genes) @ [facility] - No pathogenic mutations detected   History of kidney stones age 21's   Hypertension    Hypothyroidism    Mass of colon    sigmoid   Stroke (HCC) 01/2016   speech difficulty for a few hours after knee replacement left   Thoracic ascending aortic aneurysm (HCC)    4.6-4.7 cm followed by Dr Tyrone Sage- LOV- 08/2017-epic    Wears dentures    Wears glasses    Past Surgical History:  Procedure Laterality Date   colonscopy  02/26/2018   KNEE ARTHROPLASTY Left 01/15/2016   Procedure: COMPUTER ASSISTED TOTAL KNEE ARTHROPLASTY;  Surgeon: Eldred Manges, MD;  Location: [facility] OR;  Service: Orthopedics;  Laterality: Left;   LAPAROSCOPIC SIGMOID COLECTOMY N/A 03/08/2018   Procedure: LAPAROSCOPIC ASSISTED SIGMOID COLECTOMY ERAS PATHWAY;  Surgeon: Griselda Miner, MD;  Location: [facility];  Service: General;  Laterality: N/A;   MULTIPLE TOOTH EXTRACTIONS     TOTAL KNEE ARTHROPLASTY Right 01/13/2017   Procedure: RIGHT TOTAL KNEE ARTHROPLASTY;  Surgeon: Eldred Manges, MD;  Location: [facility] OR;  Service: Orthopedics;  Laterality: Right;   Patient Active Problem List   Diagnosis Date Noted   Hx of total knee arthroplasty, left 11/14/2023   Weakness of left quadriceps muscle 11/14/2023    Genetic testing    Colon polyps    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) 03/08/2018   Thoracic aortic aneurysm without rupture (HCC) 02/21/2017   DVT (deep venous thrombosis), left 04/13/2016   Cerebrovascular accident (CVA) due to embolism of cerebral artery (HCC) 04/13/2016   S/P knee surgery 04/13/2016   PFO (patent foramen ovale)    HLD (hyperlipidemia)    Acute CVA (cerebrovascular accident) (HCC) 02/06/2016   TIA (transient ischemic attack) 02/05/2016   Stroke (HCC) 02/05/2016   Hypothyroidism    Arthritis    Calf pain    S/P total knee arthroplasty, right 01/15/2016    PCP: Merri Brunette, MD  REFERRING PROVIDER: Rich Brave, MD  REFERRING DIAG: Eval/treat for chronic L hip, L knee pain; eval/treat back as needed. Isch tuberosity/proximal hamstring tendinosis suspected based on exam. Hamstring and quad tightness, knee pain suspected tendinosis/tight quads. Some limitations with back flexion and extension, also with glute weakness.   THERAPY DIAG:  Stiffness of left hip, not elsewhere classified  Pain in left thigh  Difficulty in walking  Rationale for Evaluation and Treatment: Rehabilitation  ONSET DATE: 4 months  SUBJECTIVE:   SUBJECTIVE STATEMENT: I have had this pain in my L thigh for about 4 months.  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  They increased my hours to 30 and I was climbing in and out of a big trunk around 7 x a day and pivoting on my L leg. I had to stop because my L thigh was hurting so badly.  I did fall about 3 yrs ago and fractured my hip but didn't go to the doctor until much later, wonder if that fall had anything to do with my L leg pain  PERTINENT HISTORY: Referred by Sports Medicine MD after evaluation. Pt does have h/o B TKA's , was reevaluated by orthopedist L knee and cleared , prior to his visit to Dr Cherre Robins. PAIN:  Are you having pain? Yes:  NPRS scale: 0 to 5 Pain location: L lat thigh extending post hip to L post knee Pain description: hurts, deep, tight, hurt  Aggravating factors: walking, weight bearing Relieving factors: sitting relieves  PRECAUTIONS: None  RED FLAGS: None   WEIGHT BEARING RESTRICTIONS: No  FALLS:  Has patient fallen in last 6 months? No  LIVING ENVIRONMENT: Lives with: lives alone Lives in: House/apartment Stairs: indoor flight Has following equipment at home: Single point cane  OCCUPATION: retired   PLOF: Independent  PATIENT GOALS: get rid of pain and be able to walk  NEXT MD VISIT: Feb 07 2024  OBJECTIVE:  Note: Objective measures were completed at Evaluation unless otherwise noted.  DIAGNOSTIC FINDINGS: IMPRESSION: 1. No acute osseous findings. 2. Moderate left hip osteoarthritis. 3. Chronic avulsion fracture of the left femoral greater trochanter.     Electronically Signed   By: Carey Bullocks M.D.   On: 01/03/2024 10:20   PATIENT SURVEYS:  Harris Hip score: 76.55 %  COGNITION: Overall cognitive status: Within functional limits for tasks assessed     SENSATION: WFL  EDEMA:  None noted  MUSCLE LENGTH: Hamstrings: Right wnl deg; Left wnl deg Maisie Fus test: Right wnl deg; Left -10 deg  POSTURE: maintains L LE ER 15 degrees as compared to R with standing and walking.  Flexed posture through B hips and trunk.  Gait with marked antalgia L, using st cane on L side for walking  PALPATION: Tender L glut medius proximal attachment to iliac crest, marked tenderness and taut L lateral hamstrings musculature , mid muscle belly, and some tenderness L ischial tuberosity  LOWER EXTREMITY ROM:  Passive ROM Right eval Left eval  Hip flexion  105  Hip extension  -10  Hip abduction  15  Hip adduction  5  Hip internal rotation  0  Hip external rotation    Knee flexion Greater than 100 Greater than 100  Knee extension    Ankle dorsiflexion    Ankle plantarflexion     Ankle inversion    Ankle eversion     (Blank rows = not tested)  LOWER EXTREMITY MMT:  MMT Right eval Left eval  Hip flexion wfl wfl  Hip extension    Hip abduction wfl wfl  Hip adduction    Hip internal rotation    Hip external rotation wfl 4-  Knee flexion wfl 4-  Knee extension wfl 4  Ankle dorsiflexion    Ankle plantarflexion    Ankle inversion    Ankle eversion     (Blank rows = not tested)  LOWER EXTREMITY SPECIAL TESTS:  Scour test - L hip, SLR -  FUNCTIONAL TESTS:  Timed up and go (TUG): 21s 6  minute walk test: unable due to painful gait, TBD   GAIT: Distance walked: 1' in clinic Assistive device utilized: Single point cane Level of assistance: Modified independence Comments: antalgic L, maintains L LE ER 15 degrees, decreased stance time on L and avoids hip ext/toe off on L                                                                                                                          TREATMENT DATE: 01/25/24: Evaluation, then in prone therapist provided distraction of L hip jt in closed pack position with thrust x 2 bouts, with notable reduction in tension L lat hamstring Pt then positioned in supine for L hip mob with movement, with lat glides femoral head with strap for IR stretch. Improved resting pain, improved IR PROM without pain following this technique   Instructed in seated self cross friction massage with dense ball(used lacrosse ball) L proximal hamstrings insertion and L mid hamstrings  Also instructed him in pendulum L LE , while standing on low platform with R LE to provide distraction, L hip jt.  PATIENT EDUCATION:  Education details: POC, goals Person educated: Patient Education method: Explanation, Demonstration, Tactile cues, and Verbal cues Education comprehension: verbalized understanding, returned demonstration, and tactile cues required  HOME EXERCISE PROGRAM: TBD  ASSESSMENT:  CLINICAL IMPRESSION: Patient is a 73 y.o.  male who was evaluated  today by physical therapy for L lat/post hip pain. He presents with taut, tender L lateral hamstring musculature, decreased ROM L hip for add and IR, decreased strength L hip ER , L quads and L hamstrings.  He also has a h/o L avulsion fx L r trochanter that is 75 yrs old.  He responded well today to gentle L hip jt mobilizations.  Needs additional progression with strengthening L quads and hams and hip ER in future visits.  He is a good candidate for skilled PT.   OBJECTIVE IMPAIRMENTS: decreased balance, difficulty walking, decreased ROM, decreased strength, impaired flexibility, and pain.   ACTIVITY LIMITATIONS: lifting, bending, sitting, squatting, transfers, and bed mobility  PARTICIPATION LIMITATIONS: cleaning, laundry, community activity, and yard work  PERSONAL FACTORS: Age, Behavior pattern, Fitness, Past/current experiences, and 1 comorbidity: PMH of L gr troch avulsion fx  are also affecting patient's functional outcome.   REHAB POTENTIAL: Good  CLINICAL DECISION MAKING: Stable/uncomplicated  EVALUATION COMPLEXITY: Low   GOALS: Goals reviewed with patient? Yes  SHORT TERM GOALS: Target date: 2 weeks 02/08/24 I HEP Baseline:initiated today Goal status: INITIAL   LONG TERM GOALS: Target date: 03/21/24, 8 weeks  Harris Hip score: 76.55 % increase to 85% function  Baseline:  Goal status: INITIAL  2.  Improve MMT with L quads , hamstrings, L hip ER from 4/5 to 5/5 Baseline:  Goal status: INITIAL  3.  Able to complete 6 min walk test and assess Baseline: unable Goal status: INITIAL  4.  TUG score improve from greater than 20 sec to 14 or  less for decreased fall risk  Baseline:  Goal status: INITIAL  PLAN:  PT FREQUENCY: 2x/week  PT DURATION: 8 weeks  PLANNED INTERVENTIONS: 97110-Therapeutic exercises, 97530- Therapeutic activity, 97112- Neuromuscular re-education, 97535- Self Care, 16109- Manual therapy, and Dry Needling  PLAN FOR NEXT  SESSION: progress with additional L hip mob with movement and muscle energy, manual techniques to relax  L lat hamstring, possibly TPDN, progress with eccentric strength L lat/post hip musculature   Elvyn Krohn L Toribio Seiber, PT, DPT, OCS 01/25/2024, 2:32 PM

## 2024-01-31 ENCOUNTER — Ambulatory Visit: Payer: Medicare HMO | Attending: Family Medicine

## 2024-01-31 DIAGNOSIS — M79652 Pain in left thigh: Secondary | ICD-10-CM | POA: Insufficient documentation

## 2024-01-31 DIAGNOSIS — R262 Difficulty in walking, not elsewhere classified: Secondary | ICD-10-CM | POA: Diagnosis not present

## 2024-01-31 DIAGNOSIS — M25652 Stiffness of left hip, not elsewhere classified: Secondary | ICD-10-CM | POA: Insufficient documentation

## 2024-01-31 NOTE — Therapy (Signed)
 OUTPATIENT PHYSICAL THERAPY LOWER EXTREMITY TREATMENT   Patient Name: Christian Weeks MRN: [id number] DOB:1951-10-27, 73 y.o., male Today's Date: 01/31/2024  END OF SESSION:  PT End of Session - 01/31/24 1058     Visit Number 2    Date for PT Re-Evaluation 03/21/24    Progress Note Due on Visit 10    PT Start Time 1051    PT Stop Time 1139    PT Time Calculation (min) 48 min    Activity Tolerance Patient tolerated treatment well    Behavior During Therapy [facility] for tasks assessed/performed              Past Medical History:  Diagnosis Date   Arthritis    Blood clot in vein 01/2016   Colon cancer (HCC)    Colon polyps    Degenerative joint disease    Genetic testing 06/11/18   Multi-Cancer panel (83 genes) @ [facility] - No pathogenic mutations detected   History of kidney stones age 31's   Hypertension    Hypothyroidism    Mass of colon    sigmoid   Stroke (HCC) 01/2016   speech difficulty for a few hours after knee replacement left   Thoracic ascending aortic aneurysm (HCC)    4.6-4.7 cm followed by Dr Army- LOV- 08/2017-epic    Wears dentures    Wears glasses    Past Surgical History:  Procedure Laterality Date   colonscopy  02/26/2018   KNEE ARTHROPLASTY Left 01/15/2016   Procedure: COMPUTER ASSISTED TOTAL KNEE ARTHROPLASTY;  Surgeon: Oneil JAYSON Herald, MD;  Location: [facility] OR;  Service: Orthopedics;  Laterality: Left;   LAPAROSCOPIC SIGMOID COLECTOMY N/A 03/08/2018   Procedure: LAPAROSCOPIC ASSISTED SIGMOID COLECTOMY ERAS PATHWAY;  Surgeon: Curvin Deward MOULD, MD;  Location: [facility];  Service: General;  Laterality: N/A;   MULTIPLE TOOTH EXTRACTIONS     TOTAL KNEE ARTHROPLASTY Right 01/13/2017   Procedure: RIGHT TOTAL KNEE ARTHROPLASTY;  Surgeon: Oneil JAYSON Herald, MD;  Location: [facility] OR;  Service: Orthopedics;  Laterality: Right;   Patient Active Problem List   Diagnosis Date Noted   Hx of total knee arthroplasty, left 11/14/2023   Weakness of left quadriceps muscle 11/14/2023    Genetic testing    Colon polyps    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) 03/08/2018   Thoracic aortic aneurysm without rupture (HCC) 02/21/2017   DVT (deep venous thrombosis), left 04/13/2016   Cerebrovascular accident (CVA) due to embolism of cerebral artery (HCC) 04/13/2016   S/P knee surgery 04/13/2016   PFO (patent foramen ovale)    HLD (hyperlipidemia)    Acute CVA (cerebrovascular accident) (HCC) 02/06/2016   TIA (transient ischemic attack) 02/05/2016   Stroke (HCC) 02/05/2016   Hypothyroidism    Arthritis    Calf pain    S/P total knee arthroplasty, right 01/15/2016    PCP: Clarice Nottingham, MD  REFERRING PROVIDER: Qiao, Jana, MD  REFERRING DIAG: Eval/treat for chronic L hip, L knee pain; eval/treat back as needed. Isch tuberosity/proximal hamstring tendinosis suspected based on exam. Hamstring and quad tightness, knee pain suspected tendinosis/tight quads. Some limitations with back flexion and extension, also with glute weakness.   THERAPY DIAG:  Stiffness of left hip, not elsewhere classified  Pain in left thigh  Difficulty in walking  Rationale for Evaluation and Treatment: Rehabilitation  ONSET DATE: 4 months  SUBJECTIVE:   SUBJECTIVE STATEMENT: Pt reports that his hip and HS feels a little better. He was able to  do yard work yesterday with intermittent breaks.  PERTINENT HISTORY: Referred by Sports Medicine MD after evaluation. Pt does have h/o B TKA's , was reevaluated by orthopedist L knee and cleared , prior to his visit to Dr Marcene. PAIN:  Are you having pain? Yes: NPRS scale: 0 to 5 Pain location: L lat thigh extending post hip to L post knee Pain description: hurts, deep, tight, hurt  Aggravating factors: walking, weight bearing Relieving factors: sitting relieves  PRECAUTIONS: None  RED FLAGS: None   WEIGHT BEARING RESTRICTIONS: No  FALLS:  Has patient fallen in last 6 months? No  LIVING ENVIRONMENT: Lives  with: lives alone Lives in: House/apartment Stairs: indoor flight Has following equipment at home: Single point cane  OCCUPATION: retired   PLOF: Independent  PATIENT GOALS: get rid of pain and be able to walk  NEXT MD VISIT: Feb 07 2024  OBJECTIVE:  Note: Objective measures were completed at Evaluation unless otherwise noted.  DIAGNOSTIC FINDINGS: IMPRESSION: 1. No acute osseous findings. 2. Moderate left hip osteoarthritis. 3. Chronic avulsion fracture of the left femoral greater trochanter.     Electronically Signed   By: Elsie Perone M.D.   On: 01/03/2024 10:20   PATIENT SURVEYS:  Harris Hip score: 76.55 %  COGNITION: Overall cognitive status: Within functional limits for tasks assessed     SENSATION: WFL  EDEMA:  None noted  MUSCLE LENGTH: Hamstrings: Right wnl deg; Left wnl deg Debby test: Right wnl deg; Left -10 deg  POSTURE: maintains L LE ER 15 degrees as compared to R with standing and walking.  Flexed posture through B hips and trunk.  Gait with marked antalgia L, using st cane on L side for walking  PALPATION: Tender L glut medius proximal attachment to iliac crest, marked tenderness and taut L lateral hamstrings musculature , mid muscle belly, and some tenderness L ischial tuberosity  LOWER EXTREMITY ROM:  Passive ROM Right eval Left eval  Hip flexion  105  Hip extension  -10  Hip abduction  15  Hip adduction  5  Hip internal rotation  0  Hip external rotation    Knee flexion Greater than 100 Greater than 100  Knee extension    Ankle dorsiflexion    Ankle plantarflexion    Ankle inversion    Ankle eversion     (Blank rows = not tested)  LOWER EXTREMITY MMT:  MMT Right eval Left eval  Hip flexion wfl wfl  Hip extension    Hip abduction wfl wfl  Hip adduction    Hip internal rotation    Hip external rotation wfl 4-  Knee flexion wfl 4-  Knee extension wfl 4  Ankle dorsiflexion    Ankle plantarflexion    Ankle inversion     Ankle eversion     (Blank rows = not tested)  LOWER EXTREMITY SPECIAL TESTS:  Scour test - L hip, SLR -  FUNCTIONAL TESTS:  Timed up and go (TUG): 21s 6 minute walk test: unable due to painful gait, TBD   GAIT: Distance walked: 51' in clinic Assistive device utilized: Single point cane Level of assistance: Modified independence Comments: antalgic L, maintains L LE ER 15 degrees, decreased stance time on L and avoids hip ext/toe off on L  TREATMENT DATE:  01/31/24 Therapeutic Exercise: to improve strength and mobility.  Demo, verbal and tactile cues throughout for technique.  Nustep L3x79min UE/LE Prone L HS curls x 10 Prone L TKE x 10 Seated hamstring stretch 2x30 bil Seated ball squeeze x15  Seated LAQ 2# x 10 bil - cues to sit straight up Seated HS curls RTB x 10 bil  01/25/24: Evaluation, then in prone therapist provided distraction of L hip jt in closed pack position with thrust x 2 bouts, with notable reduction in tension L lat hamstring Pt then positioned in supine for L hip mob with movement, with lat glides femoral head with strap for IR stretch. Improved resting pain, improved IR PROM without pain following this technique   Instructed in seated self cross friction massage with dense ball(used lacrosse ball) L proximal hamstrings insertion and L mid hamstrings  Also instructed him in pendulum L LE , while standing on low platform with R LE to provide distraction, L hip jt.  PATIENT EDUCATION:  Education details: HEP update- see below Person educated: Patient Education method: Explanation, Demonstration, Tactile cues, and Verbal cues Education comprehension: verbalized understanding, returned demonstration, and tactile cues required  HOME EXERCISE PROGRAM: Access Code: [email or web address] URL: [email or web address] Date: 01/31/2024 Prepared by:  Deyvi Bonanno  Exercises - Seated Long Arc Quad  - 1 x daily - 3 x weekly - 2 sets - 10 reps - Seated Hamstring Curl with Anchored Resistance  - 1 x daily - 3 x weekly - 2 sets - 10 reps - Seated Hip Adduction Squeeze with Ball  - 1 x daily - 3 x weekly - 2 sets - 10 reps  ASSESSMENT:  CLINICAL IMPRESSION: Progressed patient through exercises to improve strength and flexibility of his LLE. He did not present with any tightness in his HS today. Many cues required to maintain upright posture with interventions and cues for form. Pt needs more work on strength but also increasing his mobility as well, walks with antalgic gait and shortened strides.  OBJECTIVE IMPAIRMENTS: decreased balance, difficulty walking, decreased ROM, decreased strength, impaired flexibility, and pain.   ACTIVITY LIMITATIONS: lifting, bending, sitting, squatting, transfers, and bed mobility  PARTICIPATION LIMITATIONS: cleaning, laundry, community activity, and yard work  PERSONAL FACTORS: Age, Behavior pattern, Fitness, Past/current experiences, and 1 comorbidity: PMH of L gr troch avulsion fx  are also affecting patient's functional outcome.   REHAB POTENTIAL: Good  CLINICAL DECISION MAKING: Stable/uncomplicated  EVALUATION COMPLEXITY: Low   GOALS: Goals reviewed with patient? Yes  SHORT TERM GOALS: Target date: 2 weeks 02/08/24 I HEP Baseline:initiated today Goal status: INITIAL   LONG TERM GOALS: Target date: 03/21/24, 8 weeks  Harris Hip score: 76.55 % increase to 85% function  Baseline:  Goal status: INITIAL  2.  Improve MMT with L quads , hamstrings, L hip ER from 4/5 to 5/5 Baseline:  Goal status: INITIAL  3.  Able to complete 6 min walk test and assess Baseline: unable Goal status: INITIAL  4.  TUG score improve from greater than 20 sec to 14 or less for decreased fall risk  Baseline:  Goal status: INITIAL  PLAN:  PT FREQUENCY: 2x/week  PT DURATION: 8 weeks  PLANNED  INTERVENTIONS: 97110-Therapeutic exercises, 97530- Therapeutic activity, 97112- Neuromuscular re-education, 97535- Self Care, [id number]- Manual therapy, and Dry Needling  PLAN FOR NEXT SESSION: progress with additional L hip mob with movement and muscle energy, manual techniques to relax  L lat hamstring, possibly TPDN, progress with eccentric strength L  lat/post hip musculature   Sol LITTIE Gaskins, PTA 01/31/2024, 11:43 AM

## 2024-02-02 ENCOUNTER — Ambulatory Visit: Payer: Medicare HMO

## 2024-02-02 DIAGNOSIS — M25652 Stiffness of left hip, not elsewhere classified: Secondary | ICD-10-CM | POA: Diagnosis not present

## 2024-02-02 DIAGNOSIS — R262 Difficulty in walking, not elsewhere classified: Secondary | ICD-10-CM | POA: Diagnosis not present

## 2024-02-02 DIAGNOSIS — M79652 Pain in left thigh: Secondary | ICD-10-CM

## 2024-02-02 NOTE — Therapy (Signed)
 OUTPATIENT PHYSICAL THERAPY LOWER EXTREMITY TREATMENT   Patient Name: Christian Weeks MRN: [id number] DOB:1951/05/30, 73 y.o., male Today's Date: 02/02/2024  END OF SESSION:  PT End of Session - 02/02/24 0920     Visit Number 3    Date for PT Re-Evaluation 03/21/24    Progress Note Due on Visit 10    PT Start Time 0845    PT Stop Time 0928    PT Time Calculation (min) 43 min    Activity Tolerance Patient tolerated treatment well    Behavior During Therapy [facility] for tasks assessed/performed               Past Medical History:  Diagnosis Date   Arthritis    Blood clot in vein 01/2016   Colon cancer (HCC)    Colon polyps    Degenerative joint disease    Genetic testing 06/11/18   Multi-Cancer panel (83 genes) @ [facility] - No pathogenic mutations detected   History of kidney stones age 71's   Hypertension    Hypothyroidism    Mass of colon    sigmoid   Stroke (HCC) 01/2016   speech difficulty for a few hours after knee replacement left   Thoracic ascending aortic aneurysm (HCC)    4.6-4.7 cm followed by Dr Army- LOV- 08/2017-epic    Wears dentures    Wears glasses    Past Surgical History:  Procedure Laterality Date   colonscopy  02/26/2018   KNEE ARTHROPLASTY Left 01/15/2016   Procedure: COMPUTER ASSISTED TOTAL KNEE ARTHROPLASTY;  Surgeon: Oneil JAYSON Herald, MD;  Location: [facility] OR;  Service: Orthopedics;  Laterality: Left;   LAPAROSCOPIC SIGMOID COLECTOMY N/A 03/08/2018   Procedure: LAPAROSCOPIC ASSISTED SIGMOID COLECTOMY ERAS PATHWAY;  Surgeon: Curvin Deward MOULD, MD;  Location: [facility];  Service: General;  Laterality: N/A;   MULTIPLE TOOTH EXTRACTIONS     TOTAL KNEE ARTHROPLASTY Right 01/13/2017   Procedure: RIGHT TOTAL KNEE ARTHROPLASTY;  Surgeon: Oneil JAYSON Herald, MD;  Location: [facility] OR;  Service: Orthopedics;  Laterality: Right;   Patient Active Problem List   Diagnosis Date Noted   Hx of total knee arthroplasty, left 11/14/2023   Weakness of left quadriceps muscle 11/14/2023    Genetic testing    Colon polyps    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) 03/08/2018   Thoracic aortic aneurysm without rupture (HCC) 02/21/2017   DVT (deep venous thrombosis), left 04/13/2016   Cerebrovascular accident (CVA) due to embolism of cerebral artery (HCC) 04/13/2016   S/P knee surgery 04/13/2016   PFO (patent foramen ovale)    HLD (hyperlipidemia)    Acute CVA (cerebrovascular accident) (HCC) 02/06/2016   TIA (transient ischemic attack) 02/05/2016   Stroke (HCC) 02/05/2016   Hypothyroidism    Arthritis    Calf pain    S/P total knee arthroplasty, right 01/15/2016    PCP: Clarice Nottingham, MD  REFERRING PROVIDER: Qiao, Jana, MD  REFERRING DIAG: Eval/treat for chronic L hip, L knee pain; eval/treat back as needed. Isch tuberosity/proximal hamstring tendinosis suspected based on exam. Hamstring and quad tightness, knee pain suspected tendinosis/tight quads. Some limitations with back flexion and extension, also with glute weakness.   THERAPY DIAG:  Stiffness of left hip, not elsewhere classified  Pain in left thigh  Difficulty in walking  Rationale for Evaluation and Treatment: Rehabilitation  ONSET DATE: 4 months  SUBJECTIVE:   SUBJECTIVE STATEMENT: Pt reports he is sore today from last visit but not as bad as  yesterday.  PERTINENT HISTORY: Referred by Sports Medicine MD after evaluation. Pt does have h/o B TKA's , was reevaluated by orthopedist L knee and cleared , prior to his visit to Dr Marcene. PAIN:  Are you having pain? Yes: NPRS scale: 0/10 Pain location: L lat thigh extending post hip to L post knee Pain description: hurts, deep, tight, hurt  Aggravating factors: walking, weight bearing Relieving factors: sitting relieves  PRECAUTIONS: None  RED FLAGS: None   WEIGHT BEARING RESTRICTIONS: No  FALLS:  Has patient fallen in last 6 months? No  LIVING ENVIRONMENT: Lives with: lives alone Lives in:  House/apartment Stairs: indoor flight Has following equipment at home: Single point cane  OCCUPATION: retired   PLOF: Independent  PATIENT GOALS: get rid of pain and be able to walk  NEXT MD VISIT: Feb 07 2024  OBJECTIVE:  Note: Objective measures were completed at Evaluation unless otherwise noted.  DIAGNOSTIC FINDINGS: IMPRESSION: 1. No acute osseous findings. 2. Moderate left hip osteoarthritis. 3. Chronic avulsion fracture of the left femoral greater trochanter.     Electronically Signed   By: Elsie Perone M.D.   On: 01/03/2024 10:20   PATIENT SURVEYS:  Harris Hip score: 76.55 %  COGNITION: Overall cognitive status: Within functional limits for tasks assessed     SENSATION: WFL  EDEMA:  None noted  MUSCLE LENGTH: Hamstrings: Right wnl deg; Left wnl deg Debby test: Right wnl deg; Left -10 deg  POSTURE: maintains L LE ER 15 degrees as compared to R with standing and walking.  Flexed posture through B hips and trunk.  Gait with marked antalgia L, using st cane on L side for walking  PALPATION: Tender L glut medius proximal attachment to iliac crest, marked tenderness and taut L lateral hamstrings musculature , mid muscle belly, and some tenderness L ischial tuberosity  LOWER EXTREMITY ROM:  Passive ROM Right eval Left eval  Hip flexion  105  Hip extension  -10  Hip abduction  15  Hip adduction  5  Hip internal rotation  0  Hip external rotation    Knee flexion Greater than 100 Greater than 100  Knee extension    Ankle dorsiflexion    Ankle plantarflexion    Ankle inversion    Ankle eversion     (Blank rows = not tested)  LOWER EXTREMITY MMT:  MMT Right eval Left eval  Hip flexion wfl wfl  Hip extension    Hip abduction wfl wfl  Hip adduction    Hip internal rotation    Hip external rotation wfl 4-  Knee flexion wfl 4-  Knee extension wfl 4  Ankle dorsiflexion    Ankle plantarflexion    Ankle inversion    Ankle eversion     (Blank  rows = not tested)  LOWER EXTREMITY SPECIAL TESTS:  Scour test - L hip, SLR -  FUNCTIONAL TESTS:  Timed up and go (TUG): 21s 6 minute walk test: unable due to painful gait, TBD   GAIT: Distance walked: 67' in clinic Assistive device utilized: Single point cane Level of assistance: Modified independence Comments: antalgic L, maintains L LE ER 15 degrees, decreased stance time on L and avoids hip ext/toe off on L  TREATMENT DATE:  01/31/24 Therapeutic Exercise: to improve strength and mobility.  Demo, verbal and tactile cues throughout for technique.  Nustep L5x69min UE/LE Supine trunk rotation x 10 bil Supine L QS 15x5 Passive hamstring stretch supine x 45 second hold Supine march x 10 B Supine clams RTB 10x3 bil  Manual Therapy: to decrease muscle spasm, pain and improve mobility.  STM to L distal lateral hamstrings 01/31/24 Therapeutic Exercise: to improve strength and mobility.  Demo, verbal and tactile cues throughout for technique.  Nustep L3x67min UE/LE Prone L HS curls x 10 Prone L TKE x 10 Seated hamstring stretch 2x30 bil Seated ball squeeze x15  Seated LAQ 2# x 10 bil - cues to sit straight up Seated HS curls RTB x 10 bil  01/25/24: Evaluation, then in prone therapist provided distraction of L hip jt in closed pack position with thrust x 2 bouts, with notable reduction in tension L lat hamstring Pt then positioned in supine for L hip mob with movement, with lat glides femoral head with strap for IR stretch. Improved resting pain, improved IR PROM without pain following this technique   Instructed in seated self cross friction massage with dense ball(used lacrosse ball) L proximal hamstrings insertion and L mid hamstrings  Also instructed him in pendulum L LE , while standing on low platform with R LE to provide distraction, L hip jt.  PATIENT  EDUCATION:  Education details: HEP update- see below Person educated: Patient Education method: Explanation, Demonstration, Tactile cues, and Verbal cues Education comprehension: verbalized understanding, returned demonstration, and tactile cues required  HOME EXERCISE PROGRAM: Access Code: [email or web address] URL: [email or web address] Date: 01/31/2024 Prepared by: Tawan Corkern  Exercises - Seated Long Arc Quad  - 1 x daily - 3 x weekly - 2 sets - 10 reps - Seated Hamstring Curl with Anchored Resistance  - 1 x daily - 3 x weekly - 2 sets - 10 reps - Seated Hip Adduction Squeeze with Ball  - 1 x daily - 3 x weekly - 2 sets - 10 reps  ASSESSMENT:  CLINICAL IMPRESSION: Progressed patient through exercises to improve strength and flexibility of his LLE. Mostly worked on ROM today d/t reports of soreness from last visit. His L lateral hamstrings were tight again with TTP, so we did more STM to address this. He responded well to treatment.  OBJECTIVE IMPAIRMENTS: decreased balance, difficulty walking, decreased ROM, decreased strength, impaired flexibility, and pain.   ACTIVITY LIMITATIONS: lifting, bending, sitting, squatting, transfers, and bed mobility  PARTICIPATION LIMITATIONS: cleaning, laundry, community activity, and yard work  PERSONAL FACTORS: Age, Behavior pattern, Fitness, Past/current experiences, and 1 comorbidity: PMH of L gr troch avulsion fx  are also affecting patient's functional outcome.   REHAB POTENTIAL: Good  CLINICAL DECISION MAKING: Stable/uncomplicated  EVALUATION COMPLEXITY: Low   GOALS: Goals reviewed with patient? Yes  SHORT TERM GOALS: Target date: 2 weeks 02/08/24 I HEP Baseline:initiated today Goal status: INITIAL   LONG TERM GOALS: Target date: 03/21/24, 8 weeks  Harris Hip score: 76.55 % increase to 85% function  Baseline:  Goal status: INITIAL  2.  Improve MMT with L quads , hamstrings, L hip ER from 4/5 to 5/5 Baseline:  Goal  status: INITIAL  3.  Able to complete 6 min walk test and assess Baseline: unable Goal status: INITIAL  4.  TUG score improve from greater than 20 sec to 14 or less for decreased fall risk  Baseline:  Goal status: INITIAL  PLAN:  PT FREQUENCY:  2x/week  PT DURATION: 8 weeks  PLANNED INTERVENTIONS: 97110-Therapeutic exercises, 97530- Therapeutic activity, 97112- Neuromuscular re-education, 97535- Self Care, [id number]- Manual therapy, and Dry Needling  PLAN FOR NEXT SESSION: progress with additional L hip mob with movement and muscle energy, manual techniques to relax  L lat hamstring, possibly TPDN, progress with eccentric strength L lat/post hip musculature   Porcia Morganti L Ausar Georgiou, PTA 02/02/2024, 9:30 AM

## 2024-02-05 ENCOUNTER — Ambulatory Visit: Payer: Medicare HMO

## 2024-02-05 DIAGNOSIS — R262 Difficulty in walking, not elsewhere classified: Secondary | ICD-10-CM

## 2024-02-05 DIAGNOSIS — M79652 Pain in left thigh: Secondary | ICD-10-CM | POA: Diagnosis not present

## 2024-02-05 DIAGNOSIS — M25652 Stiffness of left hip, not elsewhere classified: Secondary | ICD-10-CM | POA: Diagnosis not present

## 2024-02-05 NOTE — Therapy (Signed)
 OUTPATIENT PHYSICAL THERAPY LOWER EXTREMITY TREATMENT   Patient Name: Christian Weeks MRN: [id number] DOB:08-May-1951, 73 y.o., male Today's Date: 02/05/2024  END OF SESSION:  PT End of Session - 02/05/24 1024     Visit Number 4    Date for PT Re-Evaluation 03/21/24    Authorization Type [employer]    Progress Note Due on Visit 10    PT Start Time 1017    PT Stop Time 1101    PT Time Calculation (min) 44 min    Activity Tolerance Patient tolerated treatment well    Behavior During Therapy [facility] for tasks assessed/performed                Past Medical History:  Diagnosis Date   Arthritis    Blood clot in vein 01/2016   Colon cancer (HCC)    Colon polyps    Degenerative joint disease    Genetic testing 06/11/18   Multi-Cancer panel (83 genes) @ [facility] - No pathogenic mutations detected   History of kidney stones age 20's   Hypertension    Hypothyroidism    Mass of colon    sigmoid   Stroke (HCC) 01/2016   speech difficulty for a few hours after knee replacement left   Thoracic ascending aortic aneurysm (HCC)    4.6-4.7 cm followed by Dr Nicanor Barge- LOV- 08/2017-epic    Wears dentures    Wears glasses    Past Surgical History:  Procedure Laterality Date   colonscopy  02/26/2018   KNEE ARTHROPLASTY Left 01/15/2016   Procedure: COMPUTER ASSISTED TOTAL KNEE ARTHROPLASTY;  Surgeon: Adah Acron, MD;  Location: [facility] OR;  Service: Orthopedics;  Laterality: Left;   LAPAROSCOPIC SIGMOID COLECTOMY N/A 03/08/2018   Procedure: LAPAROSCOPIC ASSISTED SIGMOID COLECTOMY ERAS PATHWAY;  Surgeon: Caralyn Chandler, MD;  Location: [facility]S;  Service: General;  Laterality: N/A;   MULTIPLE TOOTH EXTRACTIONS     TOTAL KNEE ARTHROPLASTY Right 01/13/2017   Procedure: RIGHT TOTAL KNEE ARTHROPLASTY;  Surgeon: Adah Acron, MD;  Location: [facility] OR;  Service: Orthopedics;  Laterality: Right;   Patient Active Problem List   Diagnosis Date Noted   Hx of total knee arthroplasty, left 11/14/2023    Weakness of left quadriceps muscle 11/14/2023   Genetic testing    Colon polyps    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) 03/08/2018   Thoracic aortic aneurysm without rupture (HCC) 02/21/2017   DVT (deep venous thrombosis), left 04/13/2016   Cerebrovascular accident (CVA) due to embolism of cerebral artery (HCC) 04/13/2016   S/P knee surgery 04/13/2016   PFO (patent foramen ovale)    HLD (hyperlipidemia)    Acute CVA (cerebrovascular accident) (HCC) 02/06/2016   TIA (transient ischemic attack) 02/05/2016   Stroke (HCC) 02/05/2016   Hypothyroidism    Arthritis    Calf pain    S/P total knee arthroplasty, right 01/15/2016    PCP: Imelda Man, MD  REFERRING PROVIDER: Qiao, Jana, MD  REFERRING DIAG: Eval/treat for chronic L hip, L knee pain; eval/treat back as needed. Isch tuberosity/proximal hamstring tendinosis suspected based on exam. Hamstring and quad tightness, knee pain suspected tendinosis/tight quads. Some limitations with back flexion and extension, also with glute weakness.   THERAPY DIAG:  Stiffness of left hip, not elsewhere classified  Pain in left thigh  Difficulty in walking  Rationale for Evaluation and Treatment: Rehabilitation  ONSET DATE: 4 months  SUBJECTIVE:   SUBJECTIVE STATEMENT: Pt reports he is sore today,  he notices that   PERTINENT HISTORY: Referred by Sports Medicine MD after evaluation. Pt does have h/o B TKA's , was reevaluated by orthopedist L knee and cleared , prior to his visit to Dr Liz Rigger. PAIN:  Are you having pain? Yes: NPRS scale: 0/10 Pain location: L lat thigh extending post hip to L post knee Pain description: hurts, deep, tight, hurt  Aggravating factors: walking, weight bearing Relieving factors: sitting relieves  PRECAUTIONS: None  RED FLAGS: None   WEIGHT BEARING RESTRICTIONS: No  FALLS:  Has patient fallen in last 6 months? No  LIVING ENVIRONMENT: Lives with: lives alone Lives  in: House/apartment Stairs: indoor flight Has following equipment at home: Single point cane  OCCUPATION: retired   PLOF: Independent  PATIENT GOALS: get rid of pain and be able to walk  NEXT MD VISIT: Feb 07 2024  OBJECTIVE:  Note: Objective measures were completed at Evaluation unless otherwise noted.  DIAGNOSTIC FINDINGS: IMPRESSION: 1. No acute osseous findings. 2. Moderate left hip osteoarthritis. 3. Chronic avulsion fracture of the left femoral greater trochanter.     Electronically Signed   By: Elmon Hagedorn M.D.   On: 01/03/2024 10:20   PATIENT SURVEYS:  Harris Hip score: 76.55 %  COGNITION: Overall cognitive status: Within functional limits for tasks assessed     SENSATION: WFL  EDEMA:  None noted  MUSCLE LENGTH: Hamstrings: Right wnl deg; Left wnl deg Andy Bannister test: Right wnl deg; Left -10 deg  POSTURE: maintains L LE ER 15 degrees as compared to R with standing and walking.  Flexed posture through B hips and trunk.  Gait with marked antalgia L, using st cane on L side for walking  PALPATION: Tender L glut medius proximal attachment to iliac crest, marked tenderness and taut L lateral hamstrings musculature , mid muscle belly, and some tenderness L ischial tuberosity  LOWER EXTREMITY ROM:  Passive ROM Right eval Left eval  Hip flexion  105  Hip extension  -10  Hip abduction  15  Hip adduction  5  Hip internal rotation  0  Hip external rotation    Knee flexion Greater than 100 Greater than 100  Knee extension    Ankle dorsiflexion    Ankle plantarflexion    Ankle inversion    Ankle eversion     (Blank rows = not tested)  LOWER EXTREMITY MMT:  MMT Right eval Left eval  Hip flexion wfl wfl  Hip extension    Hip abduction wfl wfl  Hip adduction    Hip internal rotation    Hip external rotation wfl 4-  Knee flexion wfl 4-  Knee extension wfl 4  Ankle dorsiflexion    Ankle plantarflexion    Ankle inversion    Ankle eversion      (Blank rows = not tested)  LOWER EXTREMITY SPECIAL TESTS:  Scour test - L hip, SLR -  FUNCTIONAL TESTS:  Timed up and go (TUG): 21s 6 minute walk test: unable due to painful gait, TBD   GAIT: Distance walked: 32' in clinic Assistive device utilized: Single point cane Level of assistance: Modified independence Comments: antalgic L, maintains L LE ER 15 degrees, decreased stance time on L and avoids hip ext/toe off on L  TREATMENT DATE:  02/05/24 Therapeutic Exercise: to improve strength and mobility.  Demo, verbal and tactile cues throughout for technique.  Nustep L5x21min UE/LE Seated LAQ 2# 2x10 Seated marching 2# 2x10  Seated hamstring curls RTB 2x10 Seated hamstring stretch x 30" Supine trunk rotation x 10 each way PROM stretching for hip flexion, ER, IR  01/31/24 Therapeutic Exercise: to improve strength and mobility.  Demo, verbal and tactile cues throughout for technique.  Nustep L5x75min UE/LE Supine trunk rotation x 10 bil Supine L QS 15x5" Passive hamstring stretch supine x 45 second hold Supine march x 10 B Supine clams RTB 10x3" bil  Manual Therapy: to decrease muscle spasm, pain and improve mobility.  STM to L distal lateral hamstrings 01/31/24 Therapeutic Exercise: to improve strength and mobility.  Demo, verbal and tactile cues throughout for technique.  Nustep L3x37min UE/LE Prone L HS curls x 10 Prone L TKE x 10 Seated hamstring stretch 2x30" bil Seated ball squeeze x15  Seated LAQ 2# x 10 bil - cues to sit straight up Seated HS curls RTB x 10 bil  01/25/24: Evaluation, then in prone therapist provided distraction of L hip jt in closed pack position with thrust x 2 bouts, with notable reduction in tension L lat hamstring Pt then positioned in supine for L hip mob with movement, with lat glides femoral head with strap for IR stretch. Improved  resting pain, improved IR PROM without pain following this technique   Instructed in seated self cross friction massage with dense ball(used lacrosse ball) L proximal hamstrings insertion and L mid hamstrings  Also instructed him in pendulum L LE , while standing on low platform with R LE to provide distraction, L hip jt.  PATIENT EDUCATION:  Education details: HEP update- see below Person educated: Patient Education method: Explanation, Demonstration, Tactile cues, and Verbal cues Education comprehension: verbalized understanding, returned demonstration, and tactile cues required  HOME EXERCISE PROGRAM: Access Code: [email or web address] URL: [email or web address] Date: 01/31/2024 Prepared by: Niara Bunker  Exercises - Seated Long Arc Quad  - 1 x daily - 3 x weekly - 2 sets - 10 reps - Seated Hamstring Curl with Anchored Resistance  - 1 x daily - 3 x weekly - 2 sets - 10 reps - Seated Hip Adduction Squeeze with Ball  - 1 x daily - 3 x weekly - 2 sets - 10 reps  ASSESSMENT:  CLINICAL IMPRESSION: Worked on more strengthening today for LE. Added PROM stretching for L hip as well. He was able to tolerate more reps with strengthening. Pretty tight with hip ER and IR. Cues throughout session required to avoid antalgic gait and for heel strike. Good response to treatment, he continues to benefit from skilled therapy.  OBJECTIVE IMPAIRMENTS: decreased balance, difficulty walking, decreased ROM, decreased strength, impaired flexibility, and pain.   ACTIVITY LIMITATIONS: lifting, bending, sitting, squatting, transfers, and bed mobility  PARTICIPATION LIMITATIONS: cleaning, laundry, community activity, and yard work  PERSONAL FACTORS: Age, Behavior pattern, Fitness, Past/current experiences, and 1 comorbidity: PMH of L gr troch avulsion fx  are also affecting patient's functional outcome.   REHAB POTENTIAL: Good  CLINICAL DECISION MAKING: Stable/uncomplicated  EVALUATION COMPLEXITY:  Low   GOALS: Goals reviewed with patient? Yes  SHORT TERM GOALS: Target date: 2 weeks 02/08/24 I HEP Baseline:initiated today Goal status: MET   LONG TERM GOALS: Target date: 03/21/24, 8 weeks  Harris Hip score: 76.55 % increase to 85% function  Baseline:  Goal status: INITIAL  2.  Improve MMT with  L quads , hamstrings, L hip ER from 4/5 to 5/5 Baseline:  Goal status: INITIAL  3.  Able to complete 6 min walk test and assess Baseline: unable Goal status: INITIAL  4.  TUG score improve from greater than 20 sec to 14 or less for decreased fall risk  Baseline:  Goal status: INITIAL  PLAN:  PT FREQUENCY: 2x/week  PT DURATION: 8 weeks  PLANNED INTERVENTIONS: 97110-Therapeutic exercises, 97530- Therapeutic activity, 97112- Neuromuscular re-education, 97535- Self Care, 40981- Manual therapy, and Dry Needling  PLAN FOR NEXT SESSION: progress with additional L hip mob with movement and muscle energy, manual techniques to relax  L lat hamstring, possibly TPDN, progress with eccentric strength L lat/post hip musculature   Dian Laprade L Amarise Lillo, PTA 02/05/2024, 11:04 AM

## 2024-02-07 ENCOUNTER — Ambulatory Visit: Payer: Medicare HMO | Admitting: Family Medicine

## 2024-02-07 ENCOUNTER — Ambulatory Visit: Payer: Self-pay

## 2024-02-07 VITALS — Ht 72.0 in | Wt 222.0 lb

## 2024-02-07 DIAGNOSIS — M25652 Stiffness of left hip, not elsewhere classified: Secondary | ICD-10-CM

## 2024-02-07 DIAGNOSIS — M7632 Iliotibial band syndrome, left leg: Secondary | ICD-10-CM

## 2024-02-07 DIAGNOSIS — M1612 Unilateral primary osteoarthritis, left hip: Secondary | ICD-10-CM

## 2024-02-07 DIAGNOSIS — M79652 Pain in left thigh: Secondary | ICD-10-CM | POA: Diagnosis not present

## 2024-02-07 DIAGNOSIS — R262 Difficulty in walking, not elsewhere classified: Secondary | ICD-10-CM | POA: Diagnosis not present

## 2024-02-07 NOTE — Progress Notes (Signed)
CHIEF COMPLAINT: No chief complaint on file.  _____________________________________________________________ SUBJECTIVE  HPI  Pt is a 73 y.o. male here for Follow-up of left hip pain Last seen 01/03/2024 Has been attending PT, overall pain has been improving Predominantly lateral hip/thigh pain following ITB distribution. No numbness or tingling, nonradiating Able to walk longer distances since incorporating PT Continues to ride stationary bike Some thigh soreness today, has PT this afternoon  MHX includes b/l TKA, CVA, PFO, L DVT, TAA, hx Stage IIIB sigmoid colon cancer, hypothyroidism, HLD  ------------------------------------------------------------------------------------------------------ Past Medical History:  Diagnosis Date   Arthritis    Blood clot in vein 01/2016   Colon cancer (HCC)    Colon polyps    Degenerative joint disease    Genetic testing 06/11/18   Multi-Cancer panel (83 genes) @ [facility] - No pathogenic mutations detected   History of kidney stones age 36's   Hypertension    Hypothyroidism    Mass of colon    sigmoid   Stroke (HCC) 01/2016   speech difficulty for a few hours after knee replacement left   Thoracic ascending aortic aneurysm (HCC)    4.6-4.7 cm followed by Dr Tyrone Sage- LOV- 08/2017-epic    Wears dentures    Wears glasses     Past Surgical History:  Procedure Laterality Date   colonscopy  02/26/2018   KNEE ARTHROPLASTY Left 01/15/2016   Procedure: COMPUTER ASSISTED TOTAL KNEE ARTHROPLASTY;  Surgeon: Eldred Manges, MD;  Location: [facility] OR;  Service: Orthopedics;  Laterality: Left;   LAPAROSCOPIC SIGMOID COLECTOMY N/A 03/08/2018   Procedure: LAPAROSCOPIC ASSISTED SIGMOID COLECTOMY ERAS PATHWAY;  Surgeon: Griselda Miner, MD;  Location: [facility];  Service: General;  Laterality: N/A;   MULTIPLE TOOTH EXTRACTIONS     TOTAL KNEE ARTHROPLASTY Right 01/13/2017   Procedure: RIGHT TOTAL KNEE ARTHROPLASTY;  Surgeon: Eldred Manges, MD;  Location: [facility] OR;  Service:  Orthopedics;  Laterality: Right;      Outpatient Encounter Medications as of 02/07/2024  Medication Sig   Cholecalciferol (VITAMIN D3) 3000 units TABS Take 3,000 Units by mouth daily.   levothyroxine (SYNTHROID) 125 MCG tablet Take 125 mcg by mouth daily before breakfast.   methylPREDNISolone (MEDROL) 4 MG tablet Medrol dose pack. Take as instructed   rosuvastatin (CRESTOR) 10 MG tablet Take 10 mg by mouth every other day.   No facility-administered encounter medications on file as of 02/07/2024.    ------------------------------------------------------------------------------------------------------  _____________________________________________________________ OBJECTIVE  PHYSICAL EXAM  Today's Vitals   02/07/24 0909  Weight: 222 lb (100.7 kg)  Height: 6' (1.829 m)   Body mass index is 30.11 kg/m.   reviewed  General: A+Ox3, no acute distress, well-nourished, appropriate affect CV: pulses 2+ regular, nondiaphoretic, no peripheral edema, cap refill <2sec Lungs: no audible wheezing, non-labored breathing, bilateral chest rise/fall, nontachypneic Skin: warm, well-perfused, non-icteric, no susp lesions or rashes Neuro:  Sensation intact, muscle tone wnl, no atrophy Psych: no signs of depression or anxiety MSK:  Lower back: NTTP lumbar spine, SIJ, glute musculature, isch tuberosity Paraspinal muscle hypertonicity B/l trendelenberg  Negative slump test  L Hip: No deformity, swelling or wasting ROM Flexion 105, ER 15, IR 40 NTTP over the hip flexors, adductors, inguinal canal, greater troch Negative log roll, stinchfield, scour Negative SLR Negative FABER, improving ROM noted, elicits hamstring tightness Negative FADIR, elicits hamstring tightness +ober, quad tightness, hamstring tightness No palpable masses _____________________________________________________________ ASSESSMENT/PLAN Junaid was seen today for follow-up.  Diagnoses and all orders for this  visit:  Osteoarthritis of left  hip, unspecified osteoarthritis type  It band syndrome, left  XR reviewed with patient. L hip pain and exam are improved from prior encounter. Recently with more lateral hip/thigh pain following ITB distribution, non-radicular, lower index of suspicion for lumbar etiology causing symptoms on today's exam. Overall has been improving with PT, still with limited ROM of hamstring/quad. Discussed incorporating stretching after his stationary bike workout at home, ITB rolling. Continuing with PT. Consider FAJ CSI, work-up/mgmt of symptoms suggestive of referred lumbar OA, additional imaging for refractory or worsening symptoms. Pt declines Rx in favor of non-medication mgmt, continuing with PT. All questions answered. Return precautions discussed. Patient verbalized understanding and is in agreement with plan. Anticipate f/u in 6-8 weeks PRN, sooner as needed, for follow-up of symptoms, pt to call.   Electronically signed by: Burna Forts, MD 02/07/2024 7:47 AM

## 2024-02-07 NOTE — Therapy (Signed)
OUTPATIENT PHYSICAL THERAPY LOWER EXTREMITY TREATMENT   Patient Name: Christian Weeks MRN: [id number] DOB:May 27, 1951, 73 y.o., male Today's Date: 02/07/2024  END OF SESSION:  PT End of Session - 02/07/24 1451     Visit Number 5    Date for PT Re-Evaluation 03/21/24    Authorization Type [employer]    Progress Note Due on Visit 10    PT Start Time 1447    PT Stop Time 1530    PT Time Calculation (min) 43 min    Activity Tolerance Patient tolerated treatment well    Behavior During Therapy [facility] for tasks assessed/performed                 Past Medical History:  Diagnosis Date   Arthritis    Blood clot in vein 01/2016   Colon cancer (HCC)    Colon polyps    Degenerative joint disease    Genetic testing 06/11/18   Multi-Cancer panel (83 genes) @ [facility] - No pathogenic mutations detected   History of kidney stones age 9's   Hypertension    Hypothyroidism    Mass of colon    sigmoid   Stroke (HCC) 01/2016   speech difficulty for a few hours after knee replacement left   Thoracic ascending aortic aneurysm (HCC)    4.6-4.7 cm followed by Dr Tyrone Sage- LOV- 08/2017-epic    Wears dentures    Wears glasses    Past Surgical History:  Procedure Laterality Date   colonscopy  02/26/2018   KNEE ARTHROPLASTY Left 01/15/2016   Procedure: COMPUTER ASSISTED TOTAL KNEE ARTHROPLASTY;  Surgeon: Eldred Manges, MD;  Location: [facility] OR;  Service: Orthopedics;  Laterality: Left;   LAPAROSCOPIC SIGMOID COLECTOMY N/A 03/08/2018   Procedure: LAPAROSCOPIC ASSISTED SIGMOID COLECTOMY ERAS PATHWAY;  Surgeon: Griselda Miner, MD;  Location: [facility];  Service: General;  Laterality: N/A;   MULTIPLE TOOTH EXTRACTIONS     TOTAL KNEE ARTHROPLASTY Right 01/13/2017   Procedure: RIGHT TOTAL KNEE ARTHROPLASTY;  Surgeon: Eldred Manges, MD;  Location: [facility] OR;  Service: Orthopedics;  Laterality: Right;   Patient Active Problem List   Diagnosis Date Noted   Hx of total knee arthroplasty, left 11/14/2023    Weakness of left quadriceps muscle 11/14/2023   Genetic testing    Colon polyps    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) 03/08/2018   Thoracic aortic aneurysm without rupture (HCC) 02/21/2017   DVT (deep venous thrombosis), left 04/13/2016   Cerebrovascular accident (CVA) due to embolism of cerebral artery (HCC) 04/13/2016   S/P knee surgery 04/13/2016   PFO (patent foramen ovale)    HLD (hyperlipidemia)    Acute CVA (cerebrovascular accident) (HCC) 02/06/2016   TIA (transient ischemic attack) 02/05/2016   Stroke (HCC) 02/05/2016   Hypothyroidism    Arthritis    Calf pain    S/P total knee arthroplasty, right 01/15/2016    PCP: Merri Brunette, MD  REFERRING PROVIDER: Rich Brave, MD  REFERRING DIAG: Eval/treat for chronic L hip, L knee pain; eval/treat back as needed. Isch tuberosity/proximal hamstring tendinosis suspected based on exam. Hamstring and quad tightness, knee pain suspected tendinosis/tight quads. Some limitations with back flexion and extension, also with glute weakness.   THERAPY DIAG:  Stiffness of left hip, not elsewhere classified  Pain in left thigh  Difficulty in walking  Rationale for Evaluation and Treatment: Rehabilitation  ONSET DATE: 4 months  SUBJECTIVE:   SUBJECTIVE STATEMENT: Pt saw Dr. Cherre Robins earlier,  reports that she found OA in his L hip checked the lumbar spine and didn't find anything  PERTINENT HISTORY: Referred by Sports Medicine MD after evaluation. Pt does have h/o B TKA's , was reevaluated by orthopedist L knee and cleared , prior to his visit to Dr Cherre Robins. PAIN:  Are you having pain? Yes: NPRS scale: 0/10 Pain location: L lat thigh extending post hip to L post knee Pain description: hurts, deep, tight, hurt  Aggravating factors: walking, weight bearing Relieving factors: sitting relieves  PRECAUTIONS: None  RED FLAGS: None   WEIGHT BEARING RESTRICTIONS: No  FALLS:  Has patient fallen in last 6  months? No  LIVING ENVIRONMENT: Lives with: lives alone Lives in: House/apartment Stairs: indoor flight Has following equipment at home: Single point cane  OCCUPATION: retired   PLOF: Independent  PATIENT GOALS: get rid of pain and be able to walk  NEXT MD VISIT: Feb 07 2024  OBJECTIVE:  Note: Objective measures were completed at Evaluation unless otherwise noted.  DIAGNOSTIC FINDINGS: IMPRESSION: 1. No acute osseous findings. 2. Moderate left hip osteoarthritis. 3. Chronic avulsion fracture of the left femoral greater trochanter.     Electronically Signed   By: Carey Bullocks M.D.   On: 01/03/2024 10:20   PATIENT SURVEYS:  Harris Hip score: 76.55 %  COGNITION: Overall cognitive status: Within functional limits for tasks assessed     SENSATION: WFL  EDEMA:  None noted  MUSCLE LENGTH: Hamstrings: Right wnl deg; Left wnl deg Maisie Fus test: Right wnl deg; Left -10 deg  POSTURE: maintains L LE ER 15 degrees as compared to R with standing and walking.  Flexed posture through B hips and trunk.  Gait with marked antalgia L, using st cane on L side for walking  PALPATION: Tender L glut medius proximal attachment to iliac crest, marked tenderness and taut L lateral hamstrings musculature , mid muscle belly, and some tenderness L ischial tuberosity  LOWER EXTREMITY ROM:  Passive ROM Right eval Left eval  Hip flexion  105  Hip extension  -10  Hip abduction  15  Hip adduction  5  Hip internal rotation  0  Hip external rotation    Knee flexion Greater than 100 Greater than 100  Knee extension    Ankle dorsiflexion    Ankle plantarflexion    Ankle inversion    Ankle eversion     (Blank rows = not tested)  LOWER EXTREMITY MMT:  MMT Right eval Left eval L 02/07/24  Hip flexion wfl wfl   Hip extension     Hip abduction wfl wfl   Hip adduction     Hip internal rotation     Hip external rotation wfl 4- 4  Knee flexion wfl 4- 4+  Knee extension wfl 4 4+   Ankle dorsiflexion     Ankle plantarflexion     Ankle inversion     Ankle eversion      (Blank rows = not tested)  LOWER EXTREMITY SPECIAL TESTS:  Scour test - L hip, SLR -  FUNCTIONAL TESTS:  Timed up and go (TUG): 21s 6 minute walk test: unable due to painful gait, TBD   GAIT: Distance walked: 33' in clinic Assistive device utilized: Single point cane Level of assistance: Modified independence Comments: antalgic L, maintains L LE ER 15 degrees, decreased stance time on L and avoids hip ext/toe off on L  TREATMENT DATE:  02/07/24 Therapeutic Exercise: to improve strength and mobility.  Demo, verbal and tactile cues throughout for technique.  Nustep L5x68min UE/LE Prone knee extension 2 x 10 Knee flexion 15# x 15 BLE Knee extension 10# x 15 BLE  Manual Therapy: to decrease muscle spasm, pain and improve mobility.  STM to L distal lateral hamstrings with massage gun  GAIT TRAINING: To normalize gait pattern and improve safety. With [facility] - cues for heel strike, increased R step length, initial antalgic gait but after cuing throughout session better gait pattern 270 ft Fwd stepping with heel strike x 10 bil  02/05/24 Therapeutic Exercise: to improve strength and mobility.  Demo, verbal and tactile cues throughout for technique.  Nustep L5x30min UE/LE Seated LAQ 2# 2x10 Seated marching 2# 2x10  Seated hamstring curls RTB 2x10 Seated hamstring stretch x 30" Supine trunk rotation x 10 each way PROM stretching for hip flexion, ER, IR  01/31/24 Therapeutic Exercise: to improve strength and mobility.  Demo, verbal and tactile cues throughout for technique.  Nustep L5x50min UE/LE Supine trunk rotation x 10 bil Supine L QS 15x5" Passive hamstring stretch supine x 45 second hold Supine march x 10 B Supine clams RTB 10x3" bil  Manual Therapy: to decrease muscle  spasm, pain and improve mobility.  STM to L distal lateral hamstrings 01/31/24 Therapeutic Exercise: to improve strength and mobility.  Demo, verbal and tactile cues throughout for technique.  Nustep L3x16min UE/LE Prone L HS curls x 10 Prone L TKE x 10 Seated hamstring stretch 2x30" bil Seated ball squeeze x15  Seated LAQ 2# x 10 bil - cues to sit straight up Seated HS curls RTB x 10 bil  01/25/24: Evaluation, then in prone therapist provided distraction of L hip jt in closed pack position with thrust x 2 bouts, with notable reduction in tension L lat hamstring Pt then positioned in supine for L hip mob with movement, with lat glides femoral head with strap for IR stretch. Improved resting pain, improved IR PROM without pain following this technique   Instructed in seated self cross friction massage with dense ball(used lacrosse ball) L proximal hamstrings insertion and L mid hamstrings  Also instructed him in pendulum L LE , while standing on low platform with R LE to provide distraction, L hip jt.  PATIENT EDUCATION:  Education details: HEP update- see below Person educated: Patient Education method: Explanation, Demonstration, Tactile cues, and Verbal cues Education comprehension: verbalized understanding, returned demonstration, and tactile cues required  HOME EXERCISE PROGRAM: Access Code: [email or web address] URL: [email or web address] Date: 01/31/2024 Prepared by: Verta Ellen  Exercises - Seated Long Arc Quad  - 1 x daily - 3 x weekly - 2 sets - 10 reps - Seated Hamstring Curl with Anchored Resistance  - 1 x daily - 3 x weekly - 2 sets - 10 reps - Seated Hip Adduction Squeeze with Ball  - 1 x daily - 3 x weekly - 2 sets - 10 reps  ASSESSMENT:  CLINICAL IMPRESSION: Pt arrived still with soreness and muscle tension in his L hamstrings. STM helped to decreased the muscle tension, afterwards worked on Scientist, research (life sciences) with weight machines. Good tolerance for bulk  strengthening, cues provided for form as needed. Provided lots of cues for gait to increase step length and heel strike, also adjusted his cane higher to improve his upright posture. Pt notes his MD wants him to continue working with PT.  OBJECTIVE IMPAIRMENTS: decreased balance, difficulty walking, decreased ROM, decreased strength, impaired  flexibility, and pain.   ACTIVITY LIMITATIONS: lifting, bending, sitting, squatting, transfers, and bed mobility  PARTICIPATION LIMITATIONS: cleaning, laundry, community activity, and yard work  PERSONAL FACTORS: Age, Behavior pattern, Fitness, Past/current experiences, and 1 comorbidity: PMH of L gr troch avulsion fx  are also affecting patient's functional outcome.   REHAB POTENTIAL: Good  CLINICAL DECISION MAKING: Stable/uncomplicated  EVALUATION COMPLEXITY: Low   GOALS: Goals reviewed with patient? Yes  SHORT TERM GOALS: Target date: 2 weeks 02/08/24 I HEP Baseline:initiated today Goal status: MET   LONG TERM GOALS: Target date: 03/21/24, 8 weeks  Harris Hip score: 76.55 % increase to 85% function  Baseline:  Goal status: INITIAL  2.  Improve MMT with L quads , hamstrings, L hip ER from 4/5 to 5/5 Baseline:  Goal status: INITIAL  3.  Able to complete 6 min walk test and assess Baseline: unable Goal status: INITIAL  4.  TUG score improve from greater than 20 sec to 14 or less for decreased fall risk  Baseline:  Goal status: INITIAL  PLAN:  PT FREQUENCY: 2x/week  PT DURATION: 8 weeks  PLANNED INTERVENTIONS: 97110-Therapeutic exercises, 97530- Therapeutic activity, 97112- Neuromuscular re-education, 97535- Self Care, [id number]- Manual therapy, and Dry Needling  PLAN FOR NEXT SESSION: progress with additional L hip mob with movement and muscle energy, manual techniques to relax  L lat hamstring, possibly TPDN, progress with eccentric strength L lat/post hip musculature   Jabria Loos L Philicia Heyne, PTA 02/07/2024, 3:40 PM

## 2024-02-12 ENCOUNTER — Ambulatory Visit: Payer: Medicare HMO

## 2024-02-12 DIAGNOSIS — M25652 Stiffness of left hip, not elsewhere classified: Secondary | ICD-10-CM

## 2024-02-12 DIAGNOSIS — M79652 Pain in left thigh: Secondary | ICD-10-CM

## 2024-02-12 DIAGNOSIS — R262 Difficulty in walking, not elsewhere classified: Secondary | ICD-10-CM | POA: Diagnosis not present

## 2024-02-12 NOTE — Therapy (Signed)
OUTPATIENT PHYSICAL THERAPY LOWER EXTREMITY TREATMENT   Patient Name: Christian Weeks MRN: [id number] DOB:Jun 11, 1951, 73 y.o., male Today's Date: 02/12/2024  END OF SESSION:  PT End of Session - 02/12/24 0937     Visit Number 6    Date for PT Re-Evaluation 03/21/24    Authorization Type [employer] Medicare    Progress Note Due on Visit 10    PT Start Time 0931    PT Stop Time 1013    PT Time Calculation (min) 42 min    Activity Tolerance Patient tolerated treatment well    Behavior During Therapy [facility] for tasks assessed/performed                  Past Medical History:  Diagnosis Date   Arthritis    Blood clot in vein 01/2016   Colon cancer (HCC)    Colon polyps    Degenerative joint disease    Genetic testing 06/11/18   Multi-Cancer panel (83 genes) @ [facility] - No pathogenic mutations detected   History of kidney stones age 16's   Hypertension    Hypothyroidism    Mass of colon    sigmoid   Stroke (HCC) 01/2016   speech difficulty for a few hours after knee replacement left   Thoracic ascending aortic aneurysm (HCC)    4.6-4.7 cm followed by Dr Tyrone Sage- LOV- 08/2017-epic    Wears dentures    Wears glasses    Past Surgical History:  Procedure Laterality Date   colonscopy  02/26/2018   KNEE ARTHROPLASTY Left 01/15/2016   Procedure: COMPUTER ASSISTED TOTAL KNEE ARTHROPLASTY;  Surgeon: Eldred Manges, MD;  Location: [facility] OR;  Service: Orthopedics;  Laterality: Left;   LAPAROSCOPIC SIGMOID COLECTOMY N/A 03/08/2018   Procedure: LAPAROSCOPIC ASSISTED SIGMOID COLECTOMY ERAS PATHWAY;  Surgeon: Griselda Miner, MD;  Location: [facility];  Service: General;  Laterality: N/A;   MULTIPLE TOOTH EXTRACTIONS     TOTAL KNEE ARTHROPLASTY Right 01/13/2017   Procedure: RIGHT TOTAL KNEE ARTHROPLASTY;  Surgeon: Eldred Manges, MD;  Location: [facility] OR;  Service: Orthopedics;  Laterality: Right;   Patient Active Problem List   Diagnosis Date Noted   Hx of total knee arthroplasty, left 11/14/2023    Weakness of left quadriceps muscle 11/14/2023   Genetic testing    Colon polyps    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) 03/08/2018   Thoracic aortic aneurysm without rupture (HCC) 02/21/2017   DVT (deep venous thrombosis), left 04/13/2016   Cerebrovascular accident (CVA) due to embolism of cerebral artery (HCC) 04/13/2016   S/P knee surgery 04/13/2016   PFO (patent foramen ovale)    HLD (hyperlipidemia)    Acute CVA (cerebrovascular accident) (HCC) 02/06/2016   TIA (transient ischemic attack) 02/05/2016   Stroke (HCC) 02/05/2016   Hypothyroidism    Arthritis    Calf pain    S/P total knee arthroplasty, right 01/15/2016    PCP: Merri Brunette, MD  REFERRING PROVIDER: Rich Brave, MD  REFERRING DIAG: Eval/treat for chronic L hip, L knee pain; eval/treat back as needed. Isch tuberosity/proximal hamstring tendinosis suspected based on exam. Hamstring and quad tightness, knee pain suspected tendinosis/tight quads. Some limitations with back flexion and extension, also with glute weakness.   THERAPY DIAG:  Stiffness of left hip, not elsewhere classified  Pain in left thigh  Difficulty in walking  Rationale for Evaluation and Treatment: Rehabilitation  ONSET DATE: 4 months  SUBJECTIVE:   SUBJECTIVE STATEMENT: Pt reports his knees  are stiff this morning.  PERTINENT HISTORY: Referred by Sports Medicine MD after evaluation. Pt does have h/o B TKA's , was reevaluated by orthopedist L knee and cleared , prior to his visit to Dr Cherre Robins. PAIN:  Are you having pain? Yes: NPRS scale: 0/10 Pain location: L lat thigh extending post hip to L post knee Pain description: hurts, deep, tight, hurt  Aggravating factors: walking, weight bearing Relieving factors: sitting relieves  PRECAUTIONS: None  RED FLAGS: None   WEIGHT BEARING RESTRICTIONS: No  FALLS:  Has patient fallen in last 6 months? No  LIVING ENVIRONMENT: Lives with: lives alone Lives in:  House/apartment Stairs: indoor flight Has following equipment at home: Single point cane  OCCUPATION: retired   PLOF: Independent  PATIENT GOALS: get rid of pain and be able to walk  NEXT MD VISIT: Feb 07 2024  OBJECTIVE:  Note: Objective measures were completed at Evaluation unless otherwise noted.  DIAGNOSTIC FINDINGS: IMPRESSION: 1. No acute osseous findings. 2. Moderate left hip osteoarthritis. 3. Chronic avulsion fracture of the left femoral greater trochanter.     Electronically Signed   By: Carey Bullocks M.D.   On: 01/03/2024 10:20   PATIENT SURVEYS:  Harris Hip score: 76.55 %  COGNITION: Overall cognitive status: Within functional limits for tasks assessed     SENSATION: WFL  EDEMA:  None noted  MUSCLE LENGTH: Hamstrings: Right wnl deg; Left wnl deg Maisie Fus test: Right wnl deg; Left -10 deg  POSTURE: maintains L LE ER 15 degrees as compared to R with standing and walking.  Flexed posture through B hips and trunk.  Gait with marked antalgia L, using st cane on L side for walking  PALPATION: Tender L glut medius proximal attachment to iliac crest, marked tenderness and taut L lateral hamstrings musculature , mid muscle belly, and some tenderness L ischial tuberosity  LOWER EXTREMITY ROM:  Passive ROM Right eval Left eval  Hip flexion  105  Hip extension  -10  Hip abduction  15  Hip adduction  5  Hip internal rotation  0  Hip external rotation    Knee flexion Greater than 100 Greater than 100  Knee extension    Ankle dorsiflexion    Ankle plantarflexion    Ankle inversion    Ankle eversion     (Blank rows = not tested)  LOWER EXTREMITY MMT:  MMT Right eval Left eval L 02/07/24  Hip flexion wfl wfl   Hip extension     Hip abduction wfl wfl   Hip adduction     Hip internal rotation     Hip external rotation wfl 4- 4  Knee flexion wfl 4- 4+  Knee extension wfl 4 4+  Ankle dorsiflexion     Ankle plantarflexion     Ankle inversion      Ankle eversion      (Blank rows = not tested)  LOWER EXTREMITY SPECIAL TESTS:  Scour test - L hip, SLR -  FUNCTIONAL TESTS:  Timed up and go (TUG): 21s 6 minute walk test: unable due to painful gait, TBD   GAIT: Distance walked: 77' in clinic Assistive device utilized: Single point cane Level of assistance: Modified independence Comments: antalgic L, maintains L LE ER 15 degrees, decreased stance time on L and avoids hip ext/toe off on L  TREATMENT DATE:  02/12/24 Therapeutic Exercise: to improve strength and mobility.  Demo, verbal and tactile cues throughout for technique.  Nustep L5x71min UE/LE Seated heel slides L x 10 Standing lumbar extension at counter 10x3" Thomas stretch L hip flexors x 2 for 1 min each Therapeutic Activity: to improve functional performance. TUG: 14 seconds no AD NEUROMUSCULAR RE-EDUCATION: To improve posture, balance, and kinesthesia. Church pew ant/post WS x 20 Retro step L back/R front x 20 Standing hip abduction x 10 RLE Standing hip extension x 10 RLE Standing marches x 10 RLE Bridges x 10  02/07/24 Therapeutic Exercise: to improve strength and mobility.  Demo, verbal and tactile cues throughout for technique.  Nustep L5x29min UE/LE Prone knee extension 2 x 10 Knee flexion 15# x 15 BLE Knee extension 10# x 15 BLE  Manual Therapy: to decrease muscle spasm, pain and improve mobility.  STM to L distal lateral hamstrings with massage gun  GAIT TRAINING: To normalize gait pattern and improve safety. With [facility] - cues for heel strike, increased R step length, initial antalgic gait but after cuing throughout session better gait pattern 270 ft Fwd stepping with heel strike x 10 bil  02/05/24 Therapeutic Exercise: to improve strength and mobility.  Demo, verbal and tactile cues throughout for technique.  Nustep L5x47min UE/LE Seated  LAQ 2# 2x10 Seated marching 2# 2x10  Seated hamstring curls RTB 2x10 Seated hamstring stretch x 30" Supine trunk rotation x 10 each way PROM stretching for hip flexion, ER, IR  01/31/24 Therapeutic Exercise: to improve strength and mobility.  Demo, verbal and tactile cues throughout for technique.  Nustep L5x68min UE/LE Supine trunk rotation x 10 bil Supine L QS 15x5" Passive hamstring stretch supine x 45 second hold Supine march x 10 B Supine clams RTB 10x3" bil  Manual Therapy: to decrease muscle spasm, pain and improve mobility.  STM to L distal lateral hamstrings 01/31/24 Therapeutic Exercise: to improve strength and mobility.  Demo, verbal and tactile cues throughout for technique.  Nustep L3x35min UE/LE Prone L HS curls x 10 Prone L TKE x 10 Seated hamstring stretch 2x30" bil Seated ball squeeze x15  Seated LAQ 2# x 10 bil - cues to sit straight up Seated HS curls RTB x 10 bil  01/25/24: Evaluation, then in prone therapist provided distraction of L hip jt in closed pack position with thrust x 2 bouts, with notable reduction in tension L lat hamstring Pt then positioned in supine for L hip mob with movement, with lat glides femoral head with strap for IR stretch. Improved resting pain, improved IR PROM without pain following this technique   Instructed in seated self cross friction massage with dense ball(used lacrosse ball) L proximal hamstrings insertion and L mid hamstrings  Also instructed him in pendulum L LE , while standing on low platform with R LE to provide distraction, L hip jt.  PATIENT EDUCATION:  Education details: HEP update- see below Person educated: Patient Education method: Explanation, Demonstration, Tactile cues, and Verbal cues Education comprehension: verbalized understanding, returned demonstration, and tactile cues required  HOME EXERCISE PROGRAM: Access Code: [email or web address] URL: [email or web address] Date: 02/12/2024 Prepared by: Verta Ellen  Exercises - Seated Long Arc Quad  - 1 x daily - 3 x weekly - 2 sets - 10 reps - Seated Hamstring Curl with Anchored Resistance  - 1 x daily - 3 x weekly - 2 sets - 10 reps - Seated Hip Adduction Squeeze with Ball  - 1 x daily - 3  x weekly - 2 sets - 10 reps - Standing Hip Abduction with Counter Support  - 1 x daily - 7 x weekly - 2 sets - 10 reps - Standing Hip Extension with Counter Support  - 1 x daily - 7 x weekly - 2 sets - 10 reps - Standing March with Counter Support  - 1 x daily - 7 x weekly - 2 sets - 10 reps  ASSESSMENT:  CLINICAL IMPRESSION: Pt has met goal for TUG scoring 14 seconds. Focusing interventions on increasing WB tolerance on LLE and decreasing antalgic gait. Provided lots of cues for gait to increase step length and heel strike. He needs stretching for his anterior hip as well. Added standing counter exercises for HEP.  OBJECTIVE IMPAIRMENTS: decreased balance, difficulty walking, decreased ROM, decreased strength, impaired flexibility, and pain.   ACTIVITY LIMITATIONS: lifting, bending, sitting, squatting, transfers, and bed mobility  PARTICIPATION LIMITATIONS: cleaning, laundry, community activity, and yard work  PERSONAL FACTORS: Age, Behavior pattern, Fitness, Past/current experiences, and 1 comorbidity: PMH of L gr troch avulsion fx  are also affecting patient's functional outcome.   REHAB POTENTIAL: Good  CLINICAL DECISION MAKING: Stable/uncomplicated  EVALUATION COMPLEXITY: Low   GOALS: Goals reviewed with patient? Yes  SHORT TERM GOALS: Target date: 2 weeks 02/08/24 I HEP Baseline:initiated today Goal status: MET   LONG TERM GOALS: Target date: 03/21/24, 8 weeks  Harris Hip score: 76.55 % increase to 85% function  Baseline:  Goal status: INITIAL  2.  Improve MMT with L quads , hamstrings, L hip ER from 4/5 to 5/5 Baseline:  Goal status: INITIAL  3.  Able to complete 6 min walk test and assess Baseline: unable Goal status:  INITIAL  4.  TUG score improve from greater than 20 sec to 14 or less for decreased fall risk  Baseline:  Goal status: 02/12/24 MET  PLAN:  PT FREQUENCY: 2x/week  PT DURATION: 8 weeks  PLANNED INTERVENTIONS: 97110-Therapeutic exercises, 97530- Therapeutic activity, 97112- Neuromuscular re-education, 97535- Self Care, [id number]- Manual therapy, and Dry Needling  PLAN FOR NEXT SESSION: progress with additional L hip mob with movement and muscle energy, manual techniques to relax  L lat hamstring, possibly TPDN, progress with eccentric strength L lat/post hip musculature   Janessa Mickle L Corbyn Steedman, PTA 02/12/2024, 10:17 AM

## 2024-10-28 DIAGNOSIS — E785 Hyperlipidemia, unspecified: Secondary | ICD-10-CM | POA: Diagnosis not present

## 2024-10-28 DIAGNOSIS — R7303 Prediabetes: Secondary | ICD-10-CM | POA: Diagnosis not present

## 2024-10-28 DIAGNOSIS — E559 Vitamin D deficiency, unspecified: Secondary | ICD-10-CM | POA: Diagnosis not present

## 2024-10-28 DIAGNOSIS — E039 Hypothyroidism, unspecified: Secondary | ICD-10-CM | POA: Diagnosis not present

## 2024-10-28 DIAGNOSIS — K7689 Other specified diseases of liver: Secondary | ICD-10-CM | POA: Diagnosis not present

## 2024-10-28 DIAGNOSIS — Z125 Encounter for screening for malignant neoplasm of prostate: Secondary | ICD-10-CM | POA: Diagnosis not present

## 2024-10-31 DIAGNOSIS — Z0000 Encounter for general adult medical examination without abnormal findings: Secondary | ICD-10-CM | POA: Diagnosis not present

## 2024-10-31 DIAGNOSIS — M810 Age-related osteoporosis without current pathological fracture: Secondary | ICD-10-CM | POA: Diagnosis not present

## 2024-10-31 DIAGNOSIS — E032 Hypothyroidism due to medicaments and other exogenous substances: Secondary | ICD-10-CM | POA: Diagnosis not present

## 2024-10-31 DIAGNOSIS — N529 Male erectile dysfunction, unspecified: Secondary | ICD-10-CM | POA: Diagnosis not present

## 2024-10-31 DIAGNOSIS — I10 Essential (primary) hypertension: Secondary | ICD-10-CM | POA: Diagnosis not present

## 2024-10-31 DIAGNOSIS — I6529 Occlusion and stenosis of unspecified carotid artery: Secondary | ICD-10-CM | POA: Diagnosis not present

## 2024-10-31 DIAGNOSIS — E559 Vitamin D deficiency, unspecified: Secondary | ICD-10-CM | POA: Diagnosis not present

## 2024-10-31 DIAGNOSIS — I700 Atherosclerosis of aorta: Secondary | ICD-10-CM | POA: Diagnosis not present

## 2024-10-31 DIAGNOSIS — I712 Thoracic aortic aneurysm, without rupture, unspecified: Secondary | ICD-10-CM | POA: Diagnosis not present

## 2024-10-31 DIAGNOSIS — R7303 Prediabetes: Secondary | ICD-10-CM | POA: Diagnosis not present

## 2024-10-31 DIAGNOSIS — I714 Abdominal aortic aneurysm, without rupture, unspecified: Secondary | ICD-10-CM | POA: Diagnosis not present

## 2024-11-05 ENCOUNTER — Other Ambulatory Visit: Payer: Self-pay | Admitting: Surgery

## 2024-11-05 DIAGNOSIS — I7121 Aneurysm of the ascending aorta, without rupture: Secondary | ICD-10-CM

## 2024-11-08 ENCOUNTER — Other Ambulatory Visit: Payer: Self-pay

## 2024-11-08 DIAGNOSIS — I714 Abdominal aortic aneurysm, without rupture, unspecified: Secondary | ICD-10-CM

## 2024-11-22 ENCOUNTER — Ambulatory Visit (HOSPITAL_COMMUNITY)
Admission: RE | Admit: 2024-11-22 | Discharge: 2024-11-22 | Disposition: A | Discharge status: Home / Self Care | Source: Ambulatory Visit | Attending: Surgery | Admitting: Surgery

## 2024-11-22 DIAGNOSIS — I7121 Aneurysm of the ascending aorta, without rupture: Secondary | ICD-10-CM | POA: Insufficient documentation

## 2024-11-22 MED ORDER — IOHEXOL 350 MG/ML SOLN
75.0000 mL | Freq: Once | INTRAVENOUS | Status: AC | PRN
  Administered 2024-11-22: 75 mL via INTRAVENOUS

## 2024-12-04 ENCOUNTER — Ambulatory Visit: Admitting: Vascular Surgery

## 2024-12-04 ENCOUNTER — Encounter: Payer: Self-pay | Admitting: Vascular Surgery

## 2024-12-04 ENCOUNTER — Ambulatory Visit

## 2024-12-04 ENCOUNTER — Ambulatory Visit (HOSPITAL_COMMUNITY): Admission: RE | Admit: 2024-12-04 | Discharge: 2024-12-04 | Attending: Surgery | Admitting: Surgery

## 2024-12-04 VITALS — BP 117/77 | HR 58 | Temp 97.9°F | Ht 72.0 in | Wt 204.0 lb

## 2024-12-04 VITALS — BP 122/72 | HR 61 | Resp 18 | Ht 72.0 in | Wt 205.0 lb

## 2024-12-04 DIAGNOSIS — I7121 Aneurysm of the ascending aorta, without rupture: Secondary | ICD-10-CM | POA: Diagnosis not present

## 2024-12-04 DIAGNOSIS — I714 Abdominal aortic aneurysm, without rupture, unspecified: Secondary | ICD-10-CM

## 2024-12-04 NOTE — Progress Notes (Signed)
 Patient ID: Christian Weeks, male   DOB: [birth year], 73 y.o.   MRN: [id number]  Reason for Consult: New Patient (Initial Visit)   Referred by Clarice Nottingham, MD  Subjective:     HPI:  Christian Weeks is a 73 y.o. male  without significant vascular history.  He does have a known history of ascending thoracic aneurysm which has been followed for approximately 7 years and he states originally diagnosed at the time he had colon cancer.  He believes the aneurysm is unchanged throughout time.  He denies any new chest, abdomen or back pain.  No family history of aneurysm disease.  He was active up until 1 year ago has had left lower extremity pain which has limited his activity but he denies claudication, tissue loss or ulceration.  No previous history of stroke, TIA or amaurosis.  He is here today for evaluation of abdominal aortic aneurysm which by outside ultrasound report was 3.9 cm.   Past Medical History:  Diagnosis Date   Arthritis    Blood clot in vein 01/2016   Colon cancer [facility])    Colon polyps    Degenerative joint disease    Genetic testing 06/11/18   Multi-Cancer panel (83 genes) @ [facility] - No pathogenic mutations detected   History of kidney stones age 30's   Hypertension    Hypothyroidism    Mass of colon    sigmoid   Stroke (HCC) 01/2016   speech difficulty for a few hours after knee replacement left   Thoracic ascending aortic aneurysm    4.6-4.7 cm followed by Dr Army- LOV- 08/2017-epic    Wears dentures    Wears glasses    Family History  Problem Relation Age of Onset   Congestive Heart Failure Mother    Diabetes Mother    Thyroid  disease Sister    Past Surgical History:  Procedure Laterality Date   colonscopy  02/26/2018   KNEE ARTHROPLASTY Left 01/15/2016   Procedure: COMPUTER ASSISTED TOTAL KNEE ARTHROPLASTY;  Surgeon: Oneil JAYSON Herald, MD;  Location: [facility] OR;  Service: Orthopedics;  Laterality: Left;   LAPAROSCOPIC SIGMOID COLECTOMY N/A 03/08/2018   Procedure:  LAPAROSCOPIC ASSISTED SIGMOID COLECTOMY ERAS PATHWAY;  Surgeon: Curvin Deward MOULD, MD;  Location: [facility] ORS;  Service: General;  Laterality: N/A;   MULTIPLE TOOTH EXTRACTIONS     TOTAL KNEE ARTHROPLASTY Right 01/13/2017   Procedure: RIGHT TOTAL KNEE ARTHROPLASTY;  Surgeon: Oneil JAYSON Herald, MD;  Location: [facility] OR;  Service: Orthopedics;  Laterality: Right;    Short Social History:  Social History   Tobacco Use   Smoking status: Former    Current packs/day: 0.00    Average packs/day: 1.5 packs/day for 30.0 years (45.0 ttl pk-yrs)    Types: Cigarettes    Start date: 11/16/1966    Quit date: 11/16/1996    Years since quitting: 28.0   Smokeless tobacco: Never   Tobacco comments:    quit 25 yrs ago  Substance Use Topics   Alcohol use: No    Allergies  Allergen Reactions   Quinolones     Patient was warned about not using Cipro and similar antibiotics. Recent studies have raised concern that fluoroquinolone antibiotics could be associated with an increased risk of aortic aneurysm Fluoroquinolones have non-antimicrobial properties that might jeopardise the integrity of the extracellular matrix of the vascular wall In a  propensity score matched cohort study in [place], there was a 66% increased rate of aortic aneurysm or dissection associated with  oral fluoroquinolone use, compared wit   Poison Oak Extract Rash    Current Outpatient Medications  Medication Sig Dispense Refill   alendronate (FOSAMAX) 70 MG tablet SMARTSIG:1 Tablet(s) By Mouth     Cholecalciferol  (VITAMIN D3) 3000 units TABS Take 3,000 Units by mouth daily.     ezetimibe (ZETIA) 10 MG tablet Take 10 mg by mouth daily.     levothyroxine  (SYNTHROID ) 125 MCG tablet Take 125 mcg by mouth daily before breakfast.     methylPREDNISolone  (MEDROL ) 4 MG tablet Medrol  dose pack. Take as instructed 21 tablet 0   rosuvastatin  (CRESTOR ) 10 MG tablet Take 10 mg by mouth every other day.     No current facility-administered medications for this  visit.    Review of Systems  Constitutional:  Constitutional negative. HENT: HENT negative.  Eyes: Eyes negative.  Cardiovascular: Positive for claudication.  GI: Gastrointestinal negative.  Musculoskeletal: Positive for gait problem and leg pain.  Hematologic: Hematologic/lymphatic negative.  Psychiatric: Psychiatric negative.        Objective:  Objective   Vitals:   12/04/24 0839  BP: 117/77  Pulse: (!) 58  Temp: 97.9 F (36.6 C)  SpO2: 93%  Weight: 204 lb (92.5 kg)  Height: 6' (1.829 m)   Body mass index is 27.67 kg/m.  Physical Exam Constitutional:      Appearance: He is normal weight.  HENT:     Head: Normocephalic.     Nose: Nose normal.  Eyes:     Pupils: Pupils are equal, round, and reactive to light.  Cardiovascular:     Pulses: Normal pulses.          Popliteal pulses are 2+ on the right side and 2+ on the left side.       Posterior tibial pulses are 2+ on the left side.  Pulmonary:     Effort: Pulmonary effort is normal.  Abdominal:     General: Abdomen is flat.     Palpations: Abdomen is soft. There is no mass.  Musculoskeletal:     Cervical back: Normal range of motion and neck supple.  Skin:    Capillary Refill: Capillary refill takes less than 2 seconds.  Neurological:     General: No focal deficit present.     Mental Status: He is alert.  Psychiatric:        Mood and Affect: Mood normal.     Data: CT IMPRESSION: 1. Stable aneurysm of the ascending thoracic aorta measuring 4.7 cm. Ascending thoracic aortic aneurysm. Recommend semi-annual imaging followup by CTA or MRA and referral to cardiothoracic surgery if not already obtained. This recommendation follows 2010 ACCF/AHA/AATS/ACR/ASA/SCA/SCAI/SIR/STS/SVM Guidelines for the Diagnosis and Management of Patients With Thoracic Aortic Disease. Circulation. 2010; 121: Z733-z630. Aortic aneurysm NOS (ICD10-I71.9) 2. No acute findings and no change from the prior chest  CT angiogram.  Abdominal Aorta Findings:  +-------------+-------+----------+----------+--------+--------+--------+  Location    AP (cm)Trans (cm)PSV (cm/s)WaveformThrombusComments  +-------------+-------+----------+----------+--------+--------+--------+  Proximal    2.82   2.84      71                                  +-------------+-------+----------+----------+--------+--------+--------+  Mid         2.10   2.08      58                                  +-------------+-------+----------+----------+--------+--------+--------+  Distal      1.84   1.84      58                                  +-------------+-------+----------+----------+--------+--------+--------+  RT CIA Prox  1.4    1.4       86                                  +-------------+-------+----------+----------+--------+--------+--------+  RT CIA Mid   1.6    1.6                                           +-------------+-------+----------+----------+--------+--------+--------+  RT CIA Distal1.8    1.8                                           +-------------+-------+----------+----------+--------+--------+--------+  LT CIA Prox  1.3    1.4       101                                 +-------------+-------+----------+----------+--------+--------+--------+  LT CIA Mid   1.5    1.5                                           +-------------+-------+----------+----------+--------+--------+--------+         Summary:  Abdominal Aorta: The largest aortic measurement is 2.8 cm. Mildy decreased  visualization of the proximal aorta due to bowel gas.  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      Assessment/Plan:     73 year old male with known history of ascending aortic aneurysm which is unchanged in size over a 7-year follow-up now with concern for small abdominal aortic aneurysm which was not replicated on our aneurysm study today.  He does have ectatic  common iliac arteries bilaterally, popliteal pulses feel normal and he has no family history of aneurysm disease.  He has follow-up with CT surgery today and undergoes yearly CT scans for follow-up.  As such I will reach out to them for addition of abdomen and pelvis at their yearly follow-up next year and I will see him with report afterwards.  If he only has ectasia of the common iliac arteries we could follow every other year in the future with ultrasound.  All questions were answered he demonstrates good understanding.     Penne Lonni Colorado MD [facility]

## 2024-12-04 NOTE — Progress Notes (Signed)
 [address] [place] [address]             [phone]            Christian Weeks [id number] 03/30/1951   History of Present Illness:  Christian Weeks is a 73 year old man with medical history of stroke, hypothyroidism, colon cancer, DVT, and hyperlipidemia who presents for continued follow up of ascending thoracic aortic aneurysm.  Aneurysm has stayed stable in size for the past 7 years and on recent CTA of chest measured 4.7 cm.  Echocardiogram from 2017 showed that the aortic valve was tri leaflet.   He presents to the clinic today and reports that he has been doing well. His blood pressure is well controlled without the use of medications. He does check his blood pressure at home and readings are 120s/80s.  He is very active around his house but has some issues with left knee pain. He was seen by vascular surgery today for concerns of abdominal aortic aneurysm, which was not seen on their recent study.  He denies chest pain, shortness of breath and lower leg swelling.   Current Outpatient Medications on File Prior to Visit  Medication Sig Dispense Refill   alendronate (FOSAMAX) 70 MG tablet SMARTSIG:1 Tablet(s) By Mouth     Cholecalciferol  (VITAMIN D3) 3000 units TABS Take 3,000 Units by mouth daily.     ezetimibe (ZETIA) 10 MG tablet Take 10 mg by mouth daily.     levothyroxine  (SYNTHROID ) 125 MCG tablet Take 125 mcg by mouth daily before breakfast.     methylPREDNISolone  (MEDROL ) 4 MG tablet Medrol  dose pack. Take as instructed 21 tablet 0   rosuvastatin  (CRESTOR ) 10 MG tablet Take 10 mg by mouth every other day.     No current facility-administered medications on file prior to visit.     ROS: Review of Systems  Constitutional: Negative.  Negative for malaise/fatigue.  Respiratory:  Negative for cough and shortness of breath.   Cardiovascular:  Negative for chest pain and leg swelling.     BP 122/72 (BP Location: Left Arm)   Pulse 61   Resp  18   Ht 6' (1.829 m)   Wt 205 lb (93 kg)   SpO2 96%   BMI 27.80 kg/m   Physical Exam Constitutional:      Appearance: Normal appearance.  HENT:     Head: Normocephalic and atraumatic.  Cardiovascular:     Rate and Rhythm: Normal rate and regular rhythm.     Heart sounds: Normal heart sounds, S1 normal and S2 normal.  Pulmonary:     Effort: Pulmonary effort is normal.     Breath sounds: Normal breath sounds.  Skin:    General: Skin is warm and dry.  Neurological:     General: No focal deficit present.     Mental Status: He is alert and oriented to person, place, and time.      Imaging: CLINICAL DATA:  Follow-up thoracic aortic aneurysm.   EXAM: CT ANGIOGRAPHY CHEST WITH CONTRAST   TECHNIQUE: Multidetector CT imaging of the chest was performed using the standard protocol during bolus administration of intravenous contrast. Multiplanar CT image reconstructions and MIPs were obtained to evaluate the vascular anatomy.   RADIATION DOSE REDUCTION: This exam was performed according to the departmental dose-optimization program which includes automated exposure control, adjustment of the mA and/or kV according to patient size and/or use of iterative  reconstruction technique.   CONTRAST:  75mL OMNIPAQUE  IOHEXOL  350 MG/ML SOLN   COMPARISON:  11/20/2023 and older exams.   FINDINGS: Cardiovascular: Ascending thoracic aorta is dilated, 4.7 cm, unchanged from the prior exam. No aortic dissection. Minor descending thoracic aorta atherosclerosis. Arch branch vessels are widely patent.   Heart is normal in size. Scattered left coronary artery calcifications. No pericardial effusion.   Mediastinum/Nodes: No neck base, mediastinal or hilar masses. No enlarged lymph nodes. Trachea and esophagus are unremarkable.   Lungs/Pleura: No evidence of pneumonia or pulmonary edema. Mild peripheral interstitial thickening. Several small nodules, largest left upper lobe, image 57, series  302, all stable and benign. No new or suspicious nodules. No pleural effusion or pneumothorax.   Upper Abdomen: No acute finding.   Musculoskeletal: No fracture or acute finding. No bone lesion. Advanced arthropathic changes of both glenohumeral joints.   Review of the MIP images confirms the above findings.   IMPRESSION: 1. Stable aneurysm of the ascending thoracic aorta measuring 4.7 cm. Ascending thoracic aortic aneurysm. Recommend semi-annual imaging followup by CTA or MRA and referral to cardiothoracic surgery if not already obtained. This recommendation follows 2010 ACCF/AHA/AATS/ACR/ASA/SCA/SCAI/SIR/STS/SVM Guidelines for the Diagnosis and Management of Patients With Thoracic Aortic Disease. Circulation. 2010; 121: [id number]. Aortic aneurysm NOS (ICD10-I71.9) 2. No acute findings and no change from the prior chest CT angiogram.   Aortic aneurysm NOS (ICD10-I71.9).     Electronically Signed   By: Alm Parkins M.D.   On: 11/22/2024 14:02       A/P: Aneurysm of ascending aorta without rupture -4.7 cm ascending thoracic aortic aneurysm on CTA of chest.  -We discussed the natural history and and risk factors for growth of ascending aortic aneurysms. Discussed recommendations to minimize the risk of further expansion or dissection including careful blood pressure control, avoidance of contact sports and heavy lifting, attention to lipid management.  We covered the importance of continued smoking cessation..  The patient does not yet meet surgical criteria of >5.5cm. The patient is aware of signs and symptoms of aortic dissection and when to present to the emergency department  -He was seen by vascular surgery for abdominal aortic aneurysm, which was not replicated on their most recent study  -Follow up in one year with CTA of chest/abd/pelvis for continued surveillance of ascending aortic aneurysm and for reassessment of abdominal aortic aneurysm     Risk  Modification:  Statin:  rosuvastatin    Smoking cessation instruction/counseling given:  commended patient for quitting and reviewed strategies for preventing relapses  Patient was counseled on importance of Blood Pressure Control  They are instructed to contact their Primary Care Physician if they start to have blood pressure readings over 130s/90s. Do not ever stop blood pressure medications on your own, unless instructed by healthcare professional.  Please avoid use of Fluoroquinolones as this can potentially increase your risk of Aortic Rupture and/or Dissection  Patient educated on signs and symptoms of Aortic Dissection, handout also provided in AVS  Manuelita CHRISTELLA Rough, PA-C 12/04/24

## 2024-12-04 NOTE — Patient Instructions (Signed)
 Risk Modification in those with ascending thoracic aortic aneurysm:   Continue control of blood pressure (prefer BP 130/80 or less)   2. Avoid fluoroquinolone antibiotics (I.e Ciprofloxacin, Avelox, Levofloxacin, Ofloxacin)   3.  Use of statin (to decrease cardiovascular risk)   4.  Exercise and activity limitations is individualized, but in general, contact sports are to be avoided and one should avoid heavy lifting (defined as half of ideal body weight) and exercises involving sustained Valsalva maneuver.   5.  Follow-up in one year with CTA chest/abdomen/pelvis.

## 2024-12-12 DIAGNOSIS — I6529 Occlusion and stenosis of unspecified carotid artery: Secondary | ICD-10-CM | POA: Diagnosis not present

## 2024-12-12 DIAGNOSIS — E559 Vitamin D deficiency, unspecified: Secondary | ICD-10-CM | POA: Diagnosis not present

## 2024-12-12 DIAGNOSIS — E032 Hypothyroidism due to medicaments and other exogenous substances: Secondary | ICD-10-CM | POA: Diagnosis not present
